# Patient Record
Sex: Female | Born: 1945 | Race: White | Hispanic: No | Marital: Married | Smoking: Never smoker
Health system: Southern US, Community
[De-identification: ages and names within clinical notes are randomized; demographics above are authoritative.]

## PROBLEM LIST (undated history)

## (undated) DIAGNOSIS — R011 Cardiac murmur, unspecified: Secondary | ICD-10-CM

## (undated) DIAGNOSIS — J45909 Unspecified asthma, uncomplicated: Secondary | ICD-10-CM

## (undated) DIAGNOSIS — M1990 Unspecified osteoarthritis, unspecified site: Secondary | ICD-10-CM

## (undated) DIAGNOSIS — E785 Hyperlipidemia, unspecified: Secondary | ICD-10-CM

## (undated) DIAGNOSIS — R0600 Dyspnea, unspecified: Secondary | ICD-10-CM

## (undated) DIAGNOSIS — Z9889 Other specified postprocedural states: Secondary | ICD-10-CM

## (undated) DIAGNOSIS — Z87442 Personal history of urinary calculi: Secondary | ICD-10-CM

## (undated) DIAGNOSIS — I2510 Atherosclerotic heart disease of native coronary artery without angina pectoris: Secondary | ICD-10-CM

## (undated) DIAGNOSIS — K649 Unspecified hemorrhoids: Secondary | ICD-10-CM

## (undated) DIAGNOSIS — T8859XA Other complications of anesthesia, initial encounter: Secondary | ICD-10-CM

## (undated) DIAGNOSIS — R112 Nausea with vomiting, unspecified: Secondary | ICD-10-CM

## (undated) DIAGNOSIS — I10 Essential (primary) hypertension: Secondary | ICD-10-CM

## (undated) DIAGNOSIS — G4730 Sleep apnea, unspecified: Secondary | ICD-10-CM

## (undated) DIAGNOSIS — I350 Nonrheumatic aortic (valve) stenosis: Secondary | ICD-10-CM

## (undated) HISTORY — DX: Cardiac murmur, unspecified: R01.1

## (undated) HISTORY — DX: Hyperlipidemia, unspecified: E78.5

## (undated) HISTORY — DX: Essential (primary) hypertension: I10

## (undated) HISTORY — PX: CARPAL TUNNEL RELEASE: SHX101

## (undated) HISTORY — DX: Unspecified hemorrhoids: K64.9

## (undated) HISTORY — PX: ABDOMINAL HYSTERECTOMY: SHX81

## (undated) HISTORY — PX: OTHER SURGICAL HISTORY: SHX169

## (undated) HISTORY — DX: Nonrheumatic aortic (valve) stenosis: I35.0

## (undated) HISTORY — PX: INGUINAL HERNIA REPAIR: SUR1180

## (undated) HISTORY — PX: CYSTECTOMY: SUR359

---

## 1998-05-08 ENCOUNTER — Encounter: Payer: Self-pay | Admitting: Family Medicine

## 1998-05-08 LAB — CONVERTED CEMR LAB: Hgb A1c MFr Bld: 7.3 %

## 1998-12-09 ENCOUNTER — Encounter: Payer: Self-pay | Admitting: Family Medicine

## 1998-12-09 LAB — CONVERTED CEMR LAB
Hgb A1c MFr Bld: 7.8 %
Microalbumin U total vol: 19 mg/L

## 1999-06-09 ENCOUNTER — Encounter: Payer: Self-pay | Admitting: Family Medicine

## 1999-06-09 LAB — CONVERTED CEMR LAB
Hgb A1c MFr Bld: 7.9 %
Microalbumin U total vol: 17.8 mg/L

## 1999-06-23 ENCOUNTER — Other Ambulatory Visit: Admission: RE | Admit: 1999-06-23 | Discharge: 1999-06-23 | Payer: Self-pay | Admitting: Family Medicine

## 2000-05-08 ENCOUNTER — Encounter: Payer: Self-pay | Admitting: Family Medicine

## 2000-05-08 LAB — CONVERTED CEMR LAB
Hgb A1c MFr Bld: 6.8 %
Microalbumin U total vol: 44.9 mg/L

## 2000-05-24 ENCOUNTER — Other Ambulatory Visit: Admission: RE | Admit: 2000-05-24 | Discharge: 2000-05-24 | Payer: Self-pay | Admitting: Family Medicine

## 2000-09-08 HISTORY — PX: OTHER SURGICAL HISTORY: SHX169

## 2000-09-19 ENCOUNTER — Encounter: Admission: RE | Admit: 2000-09-19 | Discharge: 2000-09-19 | Payer: Self-pay | Admitting: Family Medicine

## 2000-09-19 ENCOUNTER — Encounter: Payer: Self-pay | Admitting: Family Medicine

## 2000-09-26 ENCOUNTER — Ambulatory Visit (HOSPITAL_COMMUNITY): Admission: RE | Admit: 2000-09-26 | Discharge: 2000-09-26 | Payer: Self-pay | Admitting: Urology

## 2001-01-19 ENCOUNTER — Encounter: Admission: RE | Admit: 2001-01-19 | Discharge: 2001-01-19 | Payer: Self-pay | Admitting: Urology

## 2001-01-19 ENCOUNTER — Encounter: Payer: Self-pay | Admitting: Urology

## 2001-08-08 ENCOUNTER — Encounter: Payer: Self-pay | Admitting: Family Medicine

## 2001-08-08 LAB — CONVERTED CEMR LAB
Hgb A1c MFr Bld: 6.4 %
Microalbumin U total vol: 6.7 mg/L

## 2001-11-09 ENCOUNTER — Other Ambulatory Visit: Admission: RE | Admit: 2001-11-09 | Discharge: 2001-11-09 | Payer: Self-pay | Admitting: Family Medicine

## 2001-11-20 ENCOUNTER — Encounter: Payer: Self-pay | Admitting: Urology

## 2001-11-20 ENCOUNTER — Encounter: Admission: RE | Admit: 2001-11-20 | Discharge: 2001-11-20 | Payer: Self-pay | Admitting: Urology

## 2001-11-23 ENCOUNTER — Encounter: Admission: RE | Admit: 2001-11-23 | Discharge: 2001-11-23 | Payer: Self-pay | Admitting: Urology

## 2001-11-23 ENCOUNTER — Encounter: Payer: Self-pay | Admitting: Urology

## 2002-01-06 ENCOUNTER — Encounter: Payer: Self-pay | Admitting: Family Medicine

## 2002-01-06 LAB — CONVERTED CEMR LAB
Hgb A1c MFr Bld: 8.5 %
Microalbumin U total vol: 3.8 mg/L

## 2002-04-08 ENCOUNTER — Encounter: Payer: Self-pay | Admitting: Family Medicine

## 2002-04-08 LAB — CONVERTED CEMR LAB: Hgb A1c MFr Bld: 9.8 %

## 2002-05-29 ENCOUNTER — Encounter: Admission: RE | Admit: 2002-05-29 | Discharge: 2002-05-29 | Payer: Self-pay | Admitting: Urology

## 2002-05-29 ENCOUNTER — Encounter: Payer: Self-pay | Admitting: Urology

## 2002-06-04 ENCOUNTER — Ambulatory Visit (HOSPITAL_COMMUNITY): Admission: RE | Admit: 2002-06-04 | Discharge: 2002-06-04 | Payer: Self-pay | Admitting: Urology

## 2002-06-04 ENCOUNTER — Encounter: Payer: Self-pay | Admitting: Urology

## 2002-10-08 ENCOUNTER — Encounter: Payer: Self-pay | Admitting: Family Medicine

## 2002-10-08 LAB — CONVERTED CEMR LAB: Hgb A1c MFr Bld: 6.3 %

## 2002-12-09 ENCOUNTER — Encounter: Payer: Self-pay | Admitting: Family Medicine

## 2002-12-09 LAB — CONVERTED CEMR LAB
Hgb A1c MFr Bld: 6.1 %
Microalbumin U total vol: 8.3 mg/L

## 2002-12-12 ENCOUNTER — Ambulatory Visit (HOSPITAL_COMMUNITY): Admission: RE | Admit: 2002-12-12 | Discharge: 2002-12-12 | Payer: Self-pay | Admitting: Urology

## 2002-12-12 ENCOUNTER — Encounter: Payer: Self-pay | Admitting: Urology

## 2002-12-28 ENCOUNTER — Other Ambulatory Visit: Admission: RE | Admit: 2002-12-28 | Discharge: 2002-12-28 | Payer: Self-pay | Admitting: Family Medicine

## 2003-06-09 ENCOUNTER — Encounter: Payer: Self-pay | Admitting: Family Medicine

## 2003-06-09 LAB — CONVERTED CEMR LAB: Hgb A1c MFr Bld: 6.7 %

## 2003-12-10 ENCOUNTER — Encounter: Payer: Self-pay | Admitting: Family Medicine

## 2003-12-10 LAB — CONVERTED CEMR LAB
Hgb A1c MFr Bld: 7.5 %
Microalbumin U total vol: 21.8 mg/L

## 2003-12-30 ENCOUNTER — Other Ambulatory Visit: Admission: RE | Admit: 2003-12-30 | Discharge: 2003-12-30 | Payer: Self-pay | Admitting: Family Medicine

## 2004-03-08 ENCOUNTER — Encounter: Payer: Self-pay | Admitting: Family Medicine

## 2004-03-08 LAB — CONVERTED CEMR LAB: Hgb A1c MFr Bld: 6.4 %

## 2004-09-08 ENCOUNTER — Encounter: Payer: Self-pay | Admitting: Family Medicine

## 2004-09-08 LAB — CONVERTED CEMR LAB: Hgb A1c MFr Bld: 6.9 %

## 2004-09-14 ENCOUNTER — Ambulatory Visit: Payer: Self-pay | Admitting: Family Medicine

## 2004-09-16 ENCOUNTER — Ambulatory Visit: Payer: Self-pay | Admitting: Family Medicine

## 2004-12-21 ENCOUNTER — Ambulatory Visit: Payer: Self-pay | Admitting: Family Medicine

## 2005-02-06 ENCOUNTER — Encounter: Payer: Self-pay | Admitting: Family Medicine

## 2005-02-06 LAB — CONVERTED CEMR LAB
Hgb A1c MFr Bld: 7.1 %
Microalbumin U total vol: 18.5 mg/L

## 2005-02-12 ENCOUNTER — Ambulatory Visit: Payer: Self-pay | Admitting: Family Medicine

## 2005-02-15 ENCOUNTER — Other Ambulatory Visit: Admission: RE | Admit: 2005-02-15 | Discharge: 2005-02-15 | Payer: Self-pay | Admitting: Family Medicine

## 2005-02-15 ENCOUNTER — Ambulatory Visit: Payer: Self-pay | Admitting: Family Medicine

## 2005-03-03 ENCOUNTER — Ambulatory Visit: Payer: Self-pay | Admitting: Family Medicine

## 2005-04-12 ENCOUNTER — Ambulatory Visit: Payer: Self-pay | Admitting: Family Medicine

## 2005-04-13 ENCOUNTER — Encounter: Payer: Self-pay | Admitting: Family Medicine

## 2005-04-13 LAB — CONVERTED CEMR LAB: Hgb A1c MFr Bld: 6.9 %

## 2005-04-16 ENCOUNTER — Ambulatory Visit: Payer: Self-pay | Admitting: Family Medicine

## 2005-06-11 ENCOUNTER — Ambulatory Visit: Payer: Self-pay | Admitting: Family Medicine

## 2005-09-08 ENCOUNTER — Encounter: Payer: Self-pay | Admitting: Family Medicine

## 2005-09-08 LAB — CONVERTED CEMR LAB: Hgb A1c MFr Bld: 5.8 %

## 2005-10-05 ENCOUNTER — Ambulatory Visit: Payer: Self-pay | Admitting: Family Medicine

## 2006-03-08 ENCOUNTER — Encounter: Payer: Self-pay | Admitting: Family Medicine

## 2006-03-08 LAB — CONVERTED CEMR LAB: Hgb A1c MFr Bld: 6.7 %

## 2006-03-31 ENCOUNTER — Ambulatory Visit: Payer: Self-pay | Admitting: Family Medicine

## 2006-03-31 LAB — CONVERTED CEMR LAB: Microalbumin U total vol: 29.5 mg/L

## 2006-04-06 ENCOUNTER — Ambulatory Visit: Payer: Self-pay | Admitting: Family Medicine

## 2006-04-06 ENCOUNTER — Other Ambulatory Visit: Admission: RE | Admit: 2006-04-06 | Discharge: 2006-04-06 | Payer: Self-pay | Admitting: Family Medicine

## 2006-04-06 ENCOUNTER — Encounter: Payer: Self-pay | Admitting: Family Medicine

## 2006-04-06 LAB — CONVERTED CEMR LAB: Pap Smear: NORMAL

## 2006-05-03 ENCOUNTER — Ambulatory Visit: Payer: Self-pay | Admitting: Family Medicine

## 2006-10-03 ENCOUNTER — Ambulatory Visit: Payer: Self-pay | Admitting: Family Medicine

## 2006-10-05 ENCOUNTER — Ambulatory Visit: Payer: Self-pay | Admitting: Family Medicine

## 2007-01-03 ENCOUNTER — Ambulatory Visit: Payer: Self-pay | Admitting: Family Medicine

## 2007-01-03 LAB — CONVERTED CEMR LAB
Hgb A1c MFr Bld: 9.7 %
Hgb A1c MFr Bld: 9.7 % — ABNORMAL HIGH (ref 4.6–6.0)

## 2007-01-12 ENCOUNTER — Ambulatory Visit: Payer: Self-pay | Admitting: Family Medicine

## 2007-01-26 ENCOUNTER — Ambulatory Visit: Payer: Self-pay | Admitting: Family Medicine

## 2007-02-27 ENCOUNTER — Ambulatory Visit: Payer: Self-pay | Admitting: Family Medicine

## 2007-05-24 ENCOUNTER — Encounter: Payer: Self-pay | Admitting: Family Medicine

## 2007-05-24 DIAGNOSIS — E119 Type 2 diabetes mellitus without complications: Secondary | ICD-10-CM

## 2007-05-24 DIAGNOSIS — N200 Calculus of kidney: Secondary | ICD-10-CM

## 2007-05-24 HISTORY — DX: Type 2 diabetes mellitus without complications: E11.9

## 2007-05-24 HISTORY — DX: Calculus of kidney: N20.0

## 2007-05-29 ENCOUNTER — Ambulatory Visit: Payer: Self-pay | Admitting: Family Medicine

## 2007-07-20 ENCOUNTER — Telehealth (INDEPENDENT_AMBULATORY_CARE_PROVIDER_SITE_OTHER): Payer: Self-pay | Admitting: *Deleted

## 2007-09-18 ENCOUNTER — Ambulatory Visit: Payer: Self-pay | Admitting: Family Medicine

## 2007-09-18 LAB — CONVERTED CEMR LAB
ALT: 20 units/L (ref 0–35)
AST: 17 units/L (ref 0–37)
Albumin: 3.6 g/dL (ref 3.5–5.2)
Alkaline Phosphatase: 107 units/L (ref 39–117)
BUN: 15 mg/dL (ref 6–23)
Bilirubin, Direct: 0.2 mg/dL (ref 0.0–0.3)
CO2: 31 meq/L (ref 19–32)
Calcium: 9.9 mg/dL (ref 8.4–10.5)
Chloride: 106 meq/L (ref 96–112)
Cholesterol: 143 mg/dL (ref 0–200)
Creatinine, Ser: 0.4 mg/dL (ref 0.4–1.2)
Creatinine,U: 64.3 mg/dL
GFR calc Af Amer: 208 mL/min
GFR calc non Af Amer: 172 mL/min
Glucose, Bld: 121 mg/dL — ABNORMAL HIGH (ref 70–99)
HDL: 34.7 mg/dL — ABNORMAL LOW (ref >39.0)
Hgb A1c MFr Bld: 7.3 % — ABNORMAL HIGH (ref 4.6–6.0)
LDL Cholesterol: 93 mg/dL (ref 0–99)
Microalb Creat Ratio: 23.3 mg/g (ref 0.0–30.0)
Microalb, Ur: 1.5 mg/dL (ref 0.0–1.9)
Potassium: 4 meq/L (ref 3.5–5.1)
Sodium: 142 meq/L (ref 135–145)
TSH: 0.58 microintl units/mL (ref 0.35–5.50)
Total Bilirubin: 1.8 mg/dL — ABNORMAL HIGH (ref 0.3–1.2)
Total CHOL/HDL Ratio: 4.1
Total Protein: 6.3 g/dL (ref 6.0–8.3)
Triglycerides: 78 mg/dL (ref 0–149)
VLDL: 16 mg/dL (ref 0–40)

## 2007-09-20 ENCOUNTER — Encounter: Payer: Self-pay | Admitting: Family Medicine

## 2007-09-20 ENCOUNTER — Ambulatory Visit: Payer: Self-pay | Admitting: Family Medicine

## 2007-09-20 ENCOUNTER — Other Ambulatory Visit: Admission: RE | Admit: 2007-09-20 | Discharge: 2007-09-20 | Payer: Self-pay | Admitting: Family Medicine

## 2007-09-20 LAB — CONVERTED CEMR LAB: Pap Smear: NORMAL

## 2007-09-26 ENCOUNTER — Encounter (INDEPENDENT_AMBULATORY_CARE_PROVIDER_SITE_OTHER): Payer: Self-pay | Admitting: *Deleted

## 2007-10-06 ENCOUNTER — Ambulatory Visit: Payer: Self-pay | Admitting: Family Medicine

## 2007-10-09 ENCOUNTER — Encounter (INDEPENDENT_AMBULATORY_CARE_PROVIDER_SITE_OTHER): Payer: Self-pay | Admitting: *Deleted

## 2007-10-20 LAB — HM DIABETES EYE EXAM: HM Diabetic Eye Exam: NORMAL

## 2007-12-04 ENCOUNTER — Telehealth (INDEPENDENT_AMBULATORY_CARE_PROVIDER_SITE_OTHER): Payer: Self-pay | Admitting: *Deleted

## 2007-12-06 ENCOUNTER — Encounter: Payer: Self-pay | Admitting: Family Medicine

## 2007-12-18 ENCOUNTER — Ambulatory Visit: Payer: Self-pay | Admitting: Family Medicine

## 2007-12-18 LAB — CONVERTED CEMR LAB: Hgb A1c MFr Bld: 7.7 % — ABNORMAL HIGH (ref 4.6–6.0)

## 2007-12-20 ENCOUNTER — Ambulatory Visit: Payer: Self-pay | Admitting: Family Medicine

## 2007-12-20 DIAGNOSIS — M899 Disorder of bone, unspecified: Secondary | ICD-10-CM | POA: Insufficient documentation

## 2007-12-20 DIAGNOSIS — M949 Disorder of cartilage, unspecified: Secondary | ICD-10-CM

## 2007-12-20 HISTORY — DX: Disorder of bone, unspecified: M89.9

## 2008-03-18 ENCOUNTER — Ambulatory Visit: Payer: Self-pay | Admitting: Family Medicine

## 2008-03-18 LAB — CONVERTED CEMR LAB: Hgb A1c MFr Bld: 8.2 % — ABNORMAL HIGH (ref 4.6–6.0)

## 2008-03-21 ENCOUNTER — Ambulatory Visit: Payer: Self-pay | Admitting: Family Medicine

## 2008-03-29 ENCOUNTER — Telehealth: Payer: Self-pay | Admitting: Family Medicine

## 2008-07-08 ENCOUNTER — Telehealth: Payer: Self-pay | Admitting: Family Medicine

## 2008-07-09 ENCOUNTER — Ambulatory Visit: Payer: Self-pay | Admitting: Family Medicine

## 2008-07-09 LAB — CONVERTED CEMR LAB: Hgb A1c MFr Bld: 7.5 % — ABNORMAL HIGH (ref 4.6–6.0)

## 2008-07-10 LAB — CONVERTED CEMR LAB: Vit D, 1,25-Dihydroxy: 37 (ref 30–89)

## 2008-07-11 ENCOUNTER — Ambulatory Visit: Payer: Self-pay | Admitting: Family Medicine

## 2008-11-27 ENCOUNTER — Ambulatory Visit: Payer: Self-pay | Admitting: Family Medicine

## 2008-11-27 LAB — CONVERTED CEMR LAB
ALT: 24 units/L (ref 0–35)
AST: 20 units/L (ref 0–37)
Albumin: 3.7 g/dL (ref 3.5–5.2)
Alkaline Phosphatase: 103 units/L (ref 39–117)
BUN: 17 mg/dL (ref 6–23)
Basophils Absolute: 0 10*3/uL (ref 0.0–0.1)
Basophils Relative: 1.3 % (ref 0.0–3.0)
Bilirubin, Direct: 0.2 mg/dL (ref 0.0–0.3)
CO2: 30 meq/L (ref 19–32)
Calcium: 10 mg/dL (ref 8.4–10.5)
Chloride: 109 meq/L (ref 96–112)
Cholesterol: 139 mg/dL (ref 0–200)
Creatinine, Ser: 0.5 mg/dL (ref 0.4–1.2)
Creatinine,U: 80.2 mg/dL
Eosinophils Absolute: 0.1 10*3/uL (ref 0.0–0.7)
Eosinophils Relative: 2.6 % (ref 0.0–5.0)
GFR calc Af Amer: 160 mL/min
GFR calc non Af Amer: 132 mL/min
Glucose, Bld: 116 mg/dL — ABNORMAL HIGH (ref 70–99)
HCT: 40.5 % (ref 36.0–46.0)
HDL: 35.5 mg/dL — ABNORMAL LOW (ref >39.0)
Hemoglobin: 14.1 g/dL (ref 12.0–15.0)
Hgb A1c MFr Bld: 7.1 % — ABNORMAL HIGH (ref 4.6–6.0)
LDL Cholesterol: 88 mg/dL (ref 0–99)
Lymphocytes Relative: 38.1 % (ref 12.0–46.0)
MCHC: 34.7 g/dL (ref 30.0–36.0)
MCV: 90.4 fL (ref 78.0–100.0)
Microalb Creat Ratio: 24.9 mg/g (ref 0.0–30.0)
Microalb, Ur: 2 mg/dL — ABNORMAL HIGH (ref 0.0–1.9)
Monocytes Absolute: 0.3 10*3/uL (ref 0.1–1.0)
Monocytes Relative: 8.7 % (ref 3.0–12.0)
Neutro Abs: 1.8 10*3/uL (ref 1.4–7.7)
Neutrophils Relative %: 49.3 % (ref 43.0–77.0)
Platelets: 200 10*3/uL (ref 150–400)
Potassium: 4.1 meq/L (ref 3.5–5.1)
RBC: 4.48 M/uL (ref 3.87–5.11)
RDW: 13.4 % (ref 11.5–14.6)
Sodium: 142 meq/L (ref 135–145)
TSH: 0.62 microintl units/mL (ref 0.35–5.50)
Total Bilirubin: 1.8 mg/dL — ABNORMAL HIGH (ref 0.3–1.2)
Total CHOL/HDL Ratio: 3.9
Total Protein: 6.5 g/dL (ref 6.0–8.3)
Triglycerides: 79 mg/dL (ref 0–149)
VLDL: 16 mg/dL (ref 0–40)
WBC: 3.5 10*3/uL — ABNORMAL LOW (ref 4.5–10.5)

## 2008-11-28 ENCOUNTER — Encounter: Payer: Self-pay | Admitting: Family Medicine

## 2008-11-28 LAB — CONVERTED CEMR LAB: Vit D, 1,25-Dihydroxy: 71 (ref 30–89)

## 2008-12-05 ENCOUNTER — Encounter: Payer: Self-pay | Admitting: Family Medicine

## 2008-12-05 ENCOUNTER — Other Ambulatory Visit: Admission: RE | Admit: 2008-12-05 | Discharge: 2008-12-05 | Payer: Self-pay | Admitting: Family Medicine

## 2008-12-05 ENCOUNTER — Ambulatory Visit: Payer: Self-pay | Admitting: Family Medicine

## 2008-12-05 LAB — HM PAP SMEAR

## 2008-12-05 LAB — CONVERTED CEMR LAB: Pap Smear: NORMAL

## 2008-12-11 ENCOUNTER — Encounter: Payer: Self-pay | Admitting: Family Medicine

## 2008-12-20 ENCOUNTER — Encounter (INDEPENDENT_AMBULATORY_CARE_PROVIDER_SITE_OTHER): Payer: Self-pay | Admitting: *Deleted

## 2008-12-26 ENCOUNTER — Encounter (INDEPENDENT_AMBULATORY_CARE_PROVIDER_SITE_OTHER): Payer: Self-pay | Admitting: *Deleted

## 2008-12-26 ENCOUNTER — Ambulatory Visit: Payer: Self-pay | Admitting: Family Medicine

## 2008-12-26 LAB — CONVERTED CEMR LAB
OCCULT 1: NEGATIVE
OCCULT 2: NEGATIVE
OCCULT 3: NEGATIVE

## 2008-12-30 ENCOUNTER — Ambulatory Visit: Payer: Self-pay | Admitting: Family Medicine

## 2008-12-30 LAB — CONVERTED CEMR LAB
Bilirubin Urine: NEGATIVE
Glucose, Urine, Semiquant: NEGATIVE
Ketones, urine, test strip: NEGATIVE
Nitrite: NEGATIVE
Protein, U semiquant: NEGATIVE
Specific Gravity, Urine: 1.015
Urobilinogen, UA: 0.2
pH: 6

## 2009-03-20 ENCOUNTER — Ambulatory Visit: Payer: Self-pay | Admitting: Family Medicine

## 2009-06-04 ENCOUNTER — Ambulatory Visit: Payer: Self-pay | Admitting: Family Medicine

## 2009-06-04 LAB — CONVERTED CEMR LAB: Hgb A1c MFr Bld: 6.9 % — ABNORMAL HIGH (ref 4.6–6.5)

## 2009-06-11 ENCOUNTER — Ambulatory Visit: Payer: Self-pay | Admitting: Family Medicine

## 2009-07-02 ENCOUNTER — Encounter: Payer: Self-pay | Admitting: Family Medicine

## 2009-07-07 ENCOUNTER — Ambulatory Visit: Payer: Self-pay | Admitting: Family Medicine

## 2009-08-22 ENCOUNTER — Encounter: Payer: Self-pay | Admitting: Family Medicine

## 2009-08-29 ENCOUNTER — Encounter: Payer: Self-pay | Admitting: Family Medicine

## 2009-08-29 HISTORY — PX: OTHER SURGICAL HISTORY: SHX169

## 2009-08-31 ENCOUNTER — Encounter: Payer: Self-pay | Admitting: Family Medicine

## 2009-11-08 DIAGNOSIS — R011 Cardiac murmur, unspecified: Secondary | ICD-10-CM

## 2009-11-08 HISTORY — DX: Cardiac murmur, unspecified: R01.1

## 2009-12-03 ENCOUNTER — Ambulatory Visit: Payer: Self-pay | Admitting: Family Medicine

## 2009-12-04 LAB — CONVERTED CEMR LAB
ALT: 21 units/L (ref 0–35)
AST: 17 units/L (ref 0–37)
Albumin: 3.5 g/dL (ref 3.5–5.2)
Alkaline Phosphatase: 94 units/L (ref 39–117)
BUN: 14 mg/dL (ref 6–23)
Bilirubin, Direct: 0.1 mg/dL (ref 0.0–0.3)
CO2: 29 meq/L (ref 19–32)
Calcium: 9.7 mg/dL (ref 8.4–10.5)
Chloride: 106 meq/L (ref 96–112)
Cholesterol: 126 mg/dL (ref 0–200)
Creatinine, Ser: 0.4 mg/dL (ref 0.4–1.2)
Creatinine,U: 78.1 mg/dL
GFR calc non Af Amer: 170.68 mL/min (ref >60)
Glucose, Bld: 131 mg/dL — ABNORMAL HIGH (ref 70–99)
HDL: 36.8 mg/dL — ABNORMAL LOW (ref >39.00)
Hgb A1c MFr Bld: 7.8 % — ABNORMAL HIGH (ref 4.6–6.5)
LDL Cholesterol: 77 mg/dL (ref 0–99)
Microalb Creat Ratio: 48.7 mg/g — ABNORMAL HIGH (ref 0.0–30.0)
Microalb, Ur: 3.8 mg/dL — ABNORMAL HIGH (ref 0.0–1.9)
Phosphorus: 3.7 mg/dL (ref 2.3–4.6)
Potassium: 4 meq/L (ref 3.5–5.1)
Sodium: 142 meq/L (ref 135–145)
TSH: 0.73 microintl units/mL (ref 0.35–5.50)
Total Bilirubin: 1.2 mg/dL (ref 0.3–1.2)
Total CHOL/HDL Ratio: 3
Total Protein: 6.5 g/dL (ref 6.0–8.3)
Triglycerides: 63 mg/dL (ref 0.0–149.0)
VLDL: 12.6 mg/dL (ref 0.0–40.0)
Vit D, 25-Hydroxy: 31 ng/mL (ref 30–89)

## 2009-12-15 ENCOUNTER — Encounter: Payer: Self-pay | Admitting: Family Medicine

## 2009-12-16 ENCOUNTER — Ambulatory Visit: Payer: Self-pay | Admitting: Family Medicine

## 2009-12-16 DIAGNOSIS — T81329A Deep disruption or dehiscence of operation wound, unspecified, initial encounter: Secondary | ICD-10-CM | POA: Insufficient documentation

## 2009-12-16 DIAGNOSIS — T8132XA Disruption of internal operation (surgical) wound, not elsewhere classified, initial encounter: Secondary | ICD-10-CM | POA: Insufficient documentation

## 2009-12-16 DIAGNOSIS — E8941 Symptomatic postprocedural ovarian failure: Secondary | ICD-10-CM | POA: Insufficient documentation

## 2009-12-16 DIAGNOSIS — T8131XA Disruption of external operation (surgical) wound, not elsewhere classified, initial encounter: Secondary | ICD-10-CM | POA: Insufficient documentation

## 2009-12-16 HISTORY — DX: Symptomatic postprocedural ovarian failure: E89.41

## 2009-12-16 LAB — CONVERTED CEMR LAB
Bilirubin Urine: NEGATIVE
Glucose, Urine, Semiquant: 100
Ketones, urine, test strip: NEGATIVE
Nitrite: NEGATIVE
Protein, U semiquant: NEGATIVE
Specific Gravity, Urine: 1.02
Urobilinogen, UA: 0.2
WBC Urine, dipstick: NEGATIVE
pH: 6

## 2009-12-23 ENCOUNTER — Encounter: Payer: Self-pay | Admitting: Family Medicine

## 2009-12-24 ENCOUNTER — Encounter: Payer: Self-pay | Admitting: Family Medicine

## 2009-12-31 ENCOUNTER — Ambulatory Visit: Payer: Self-pay | Admitting: Family Medicine

## 2009-12-31 ENCOUNTER — Encounter (INDEPENDENT_AMBULATORY_CARE_PROVIDER_SITE_OTHER): Payer: Self-pay | Admitting: *Deleted

## 2009-12-31 LAB — CONVERTED CEMR LAB
OCCULT 1: NEGATIVE
OCCULT 2: NEGATIVE
OCCULT 3: NEGATIVE

## 2009-12-31 LAB — FECAL OCCULT BLOOD, GUAIAC: Fecal Occult Blood: NEGATIVE

## 2010-01-28 ENCOUNTER — Ambulatory Visit: Payer: Self-pay | Admitting: Family Medicine

## 2010-01-28 LAB — CONVERTED CEMR LAB
Glucose, Bld: 151 mg/dL — ABNORMAL HIGH (ref 70–99)
Hgb A1c MFr Bld: 7.9 % — ABNORMAL HIGH (ref 4.6–6.5)

## 2010-02-09 ENCOUNTER — Ambulatory Visit: Payer: Self-pay | Admitting: Family Medicine

## 2010-04-16 ENCOUNTER — Ambulatory Visit: Payer: Self-pay | Admitting: Family Medicine

## 2010-04-16 LAB — CONVERTED CEMR LAB
Glucose, Bld: 130 mg/dL — ABNORMAL HIGH (ref 70–99)
Hgb A1c MFr Bld: 8 % — ABNORMAL HIGH (ref 4.6–6.5)

## 2010-04-20 ENCOUNTER — Ambulatory Visit: Payer: Self-pay | Admitting: Family Medicine

## 2010-04-20 ENCOUNTER — Telehealth: Payer: Self-pay | Admitting: Family Medicine

## 2010-04-20 LAB — CONVERTED CEMR LAB
Bilirubin Urine: NEGATIVE
Glucose, Urine, Semiquant: NEGATIVE
Ketones, urine, test strip: NEGATIVE
Nitrite: NEGATIVE
Specific Gravity, Urine: >=1.03
Urobilinogen, UA: 0.2
pH: 6

## 2010-04-29 ENCOUNTER — Ambulatory Visit: Payer: Self-pay | Admitting: Family Medicine

## 2010-05-06 ENCOUNTER — Encounter (INDEPENDENT_AMBULATORY_CARE_PROVIDER_SITE_OTHER): Payer: Self-pay | Admitting: *Deleted

## 2010-05-06 ENCOUNTER — Telehealth: Payer: Self-pay | Admitting: Family Medicine

## 2010-05-25 ENCOUNTER — Telehealth: Payer: Self-pay | Admitting: Family Medicine

## 2010-05-26 ENCOUNTER — Ambulatory Visit: Payer: Self-pay | Admitting: Family Medicine

## 2010-05-26 ENCOUNTER — Encounter: Payer: Self-pay | Admitting: Family Medicine

## 2010-05-26 DIAGNOSIS — R011 Cardiac murmur, unspecified: Secondary | ICD-10-CM

## 2010-05-26 DIAGNOSIS — R319 Hematuria, unspecified: Secondary | ICD-10-CM | POA: Insufficient documentation

## 2010-05-26 HISTORY — DX: Cardiac murmur, unspecified: R01.1

## 2010-05-26 LAB — CONVERTED CEMR LAB
Bilirubin Urine: NEGATIVE
Glucose, Urine, Semiquant: NEGATIVE
Ketones, urine, test strip: NEGATIVE
Nitrite: NEGATIVE
Specific Gravity, Urine: >=1.03
Urobilinogen, UA: 0.2
WBC Urine, dipstick: NEGATIVE
pH: 6

## 2010-06-08 ENCOUNTER — Encounter: Payer: Self-pay | Admitting: Family Medicine

## 2010-06-11 ENCOUNTER — Ambulatory Visit: Payer: Self-pay

## 2010-06-11 ENCOUNTER — Encounter: Payer: Self-pay | Admitting: Family Medicine

## 2010-06-15 ENCOUNTER — Encounter (INDEPENDENT_AMBULATORY_CARE_PROVIDER_SITE_OTHER): Payer: Self-pay | Admitting: *Deleted

## 2010-06-16 ENCOUNTER — Encounter: Payer: Self-pay | Admitting: Family Medicine

## 2010-06-17 ENCOUNTER — Encounter (INDEPENDENT_AMBULATORY_CARE_PROVIDER_SITE_OTHER): Payer: Self-pay | Admitting: *Deleted

## 2010-08-27 ENCOUNTER — Ambulatory Visit: Payer: Self-pay | Admitting: Family Medicine

## 2010-08-27 ENCOUNTER — Encounter: Payer: Self-pay | Admitting: Gastroenterology

## 2010-08-27 DIAGNOSIS — R109 Unspecified abdominal pain: Secondary | ICD-10-CM | POA: Insufficient documentation

## 2010-08-27 DIAGNOSIS — Z87448 Personal history of other diseases of urinary system: Secondary | ICD-10-CM | POA: Insufficient documentation

## 2010-08-27 HISTORY — DX: Unspecified abdominal pain: R10.9

## 2010-08-27 LAB — CONVERTED CEMR LAB
Bilirubin Urine: NEGATIVE
Blood in Urine, dipstick: NEGATIVE
Glucose, Urine, Semiquant: NEGATIVE
Ketones, urine, test strip: NEGATIVE
Nitrite: NEGATIVE
Protein, U semiquant: NEGATIVE
Specific Gravity, Urine: 1.02
Urobilinogen, UA: 0.2
WBC Urine, dipstick: NEGATIVE
pH: 6

## 2010-08-28 LAB — CONVERTED CEMR LAB: Hgb A1c MFr Bld: 7.4 % — ABNORMAL HIGH (ref 4.6–6.5)

## 2010-09-25 ENCOUNTER — Encounter (INDEPENDENT_AMBULATORY_CARE_PROVIDER_SITE_OTHER): Payer: Self-pay | Admitting: *Deleted

## 2010-09-28 ENCOUNTER — Ambulatory Visit: Payer: Self-pay | Admitting: Gastroenterology

## 2010-10-12 ENCOUNTER — Ambulatory Visit: Payer: Self-pay | Admitting: Gastroenterology

## 2010-10-12 LAB — HM COLONOSCOPY

## 2010-10-14 ENCOUNTER — Encounter: Payer: Self-pay | Admitting: Gastroenterology

## 2010-11-08 LAB — HM DEXA SCAN

## 2010-12-05 ENCOUNTER — Encounter: Payer: Self-pay | Admitting: Family Medicine

## 2010-12-06 LAB — CONVERTED CEMR LAB
Calcium, Total (PTH): 10.3 mg/dL (ref 8.4–10.5)
PTH: 32.5 pg/mL (ref 14.0–72.0)
Vit D, 1,25-Dihydroxy: 27 — ABNORMAL LOW (ref 30–89)

## 2010-12-10 NOTE — Assessment & Plan Note (Signed)
Summary: ? UTI  /LSF   Vital Signs:  Patient profile:   65 year old female Height:      65 inches Weight:      161.50 pounds BMI:     26.97 Temp:     98.8 degrees F oral Pulse rate:   84 / minute Pulse rhythm:   regular BP sitting:   142 / 82  (left arm) Cuff size:   regular  Vitals Entered By: Delilah Shan CMA Mallary Kreger Dull) (May 26, 2010 3:39 PM) CC: ? UTI   History of Present Illness: dysuria: as below duration of symptoms: over the weekend, last 3-4 days.  abdominal pain: occ "twinge in lower abdomen" to the left of midline fevers:no back pain:no vomiting:no other concerns: "I thought the twinges were due to working in the yard."    Saw some blood in urine Saturday and Sunday.  Had prev been on cipro starting on 04/20/10 for 10 days.  Was doing well after that.   No fevers.  H/o kidney stones but "this is nothing like that".  No change in BMs.  On aspirin.  No other bleeding, bruising.   Symptoms restarted as above.   Hernia repair, hysterectomy, and bladder tack 10/10. Nonsmoker.   No CP/SOB/BLE edema.  Feeling well o/w.  "I don't feel bad, I just wanted to get this checked out."  Allergies: 1)  Penicillin G Potassium (Penicillin G Potassium) 2)  * Sulfa (Sulfonamides) Group  Past History:  Past Medical History: Last updated: 05/24/2007 Diabetes mellitus, type II Hypertension  Review of Systems       See HPI.  Otherwise noncontributory.    Physical Exam  General:  GEN: nad, alert and oriented HEENT: mucous membranes moist NECK: supple w/o LA CV: rrr.  new SEM heard throughout the precordium and radiates up to the carotids bilaterally.  PULM: ctab, no inc wob ABD: soft, +bs, no rebound, no cva pain EXT: no edema SKIN: no acute rash    Impression & Recommendations:  Problem # 1:  HEMATURIA UNSPECIFIED (ICD-599.70) Ua reviewed.  No fevers and no pain with urination. Nontoxic.  Will check cx and then contact patient.  Will treat if organism ID'd.  If no  organism seen, then patient will follow up is symptoms continue.   The following medications were removed from the medication list:    Cipro 500 Mg Tabs (Ciprofloxacin hcl) ..... One tab by mouth two times a day for ten days.  Orders: T-Culture, Urine ([id number]) Specimen Handling ([id number])  Problem # 2:  CARDIAC MURMUR (ICD-785.2) New murmur.  Will send for echo. No BLE edema and lungs clear.  Stable based on exam.  Okay for outpatient follow up.  If any SOB/CP/inc in bilateral lower extremity edema, then to ER/notify MD.  she agrees.  Plan for follow up in 1 month.  Orders: Radiology Referral (Radiology)  Complete Medication List: 1)  Nasonex 50 Mcg/act Susp (Mometasone furoate) .Marland Kitchen.. 1 spray each nostril every day or as needed 2)  Quinapril Hcl 20 Mg Tabs (Quinapril hcl) .... Take one by mouth twice daily 3)  Lipitor 10 Mg Tabs (Atorvastatin calcium) .... Take one by mouth at bedtime 4)  Hydrochlorothiazide 25 Mg Tabs (Hydrochlorothiazide) .... Take one by mouth daily 5)  Metformin Hcl 1000 Mg Tabs (Metformin hcl) .... Take one by mouth twice a day. 6)  Levemir Flexpen 100 Unit/ml Soln (Insulin detemir) .... Inject 85 units subq daily dx 250.00 7)  Freestyle Lite Test Strp (Glucose blood) .Marland KitchenMarland KitchenMarland Kitchen  Check blood sugar 4 times a day 8)  Fish Oil Concentrate 1000 Mg Caps (Omega-3 fatty acids) .Marland Kitchen.. 1 by mouth twice daily 9)  Vitamin D 1000 Unit Tabs (Cholecalciferol) .Marland Kitchen.. 1 daily by mouth 10)  Bd Pen Needle Short U/f 31g X 8 Mm Misc (Insulin pen needle) .... Use as directed 11)  Aspirin 81 Mg Tbec (Aspirin) .... Take one by mouth daily 12)  Freestyle Lancets Misc (Lancets) .... Check blood sugar 4 times a day.  Patient Instructions: 1)  We'll contact you with your lab report.  I want to see you back in 1 month to recheck your heart murmur.  See Shirlee Limerick about your referral before your leave today.  Let us know if your symptoms change.   Laboratory Results   Urine Tests  Date/Time Received:  May 26, 2010 3:40 PM   Routine Urinalysis   Color: orange Appearance: Cloudy Glucose: negative   (Normal Range: Negative) Bilirubin: negative   (Normal Range: Negative) Ketone: negative   (Normal Range: Negative) Spec. Gravity: >=1.030   (Normal Range: 1.003-1.035) Blood: large   (Normal Range: Negative) pH: 6.0   (Normal Range: 5.0-8.0) Protein: trace   (Normal Range: Negative) Urobilinogen: 0.2   (Normal Range: 0-1) Nitrite: negative   (Normal Range: Negative) Leukocyte Esterace: negative   (Normal Range: Negative)       Current Allergies (reviewed today): PENICILLIN G POTASSIUM (PENICILLIN G POTASSIUM) * SULFA (SULFONAMIDES) GROUP

## 2010-12-10 NOTE — Assessment & Plan Note (Signed)
Summary: DISCUSS MEDICATIONS/CLE   Vital Signs:  Patient profile:   65 year old female Weight:      166 pounds BMI:     27.72 Temp:     98.1 degrees F oral Pulse rate:   88 / minute Pulse rhythm:   regular BP sitting:   120 / 60  (left arm) Cuff size:   regular  Vitals Entered ByProvidence Crosby (Mar 20, 2009 10:25 AM) CC: followup [per [facility] and dr. Hetty Ely   History of Present Illness: Pt here to discuss stopping Avandia due to being on Levemir. She has had great control and feels well without any untoward sxs but am getting things regularly from the insurance co talking about complications of Avandia and Levemir.   Allergies: 1)  Penicillin G Potassium (Penicillin G Potassium) 2)  * Sulfa (Sulfonamides) Group  Physical Exam  General:  Well-developed,well-nourished,in no acute distress; alert,appropriate and cooperative throughout examination Head:  Normocephalic and atraumatic without obvious abnormalities. No apparent alopecia or balding. Eyes:  No corneal or conjunctival inflammation noted. EOMI. Perrla. Funduscopic exam benign, without hemorrhages, exudates or papilledema. Vision grossly normal. Ears:  External ear exam shows no significant lesions or deformities.  Otoscopic examination reveals clear canals, tympanic membranes are intact bilaterally without bulging, retraction, inflammation or discharge. Hearing is grossly normal bilaterally. Nose:  External nasal examination shows no deformity or inflammation. Nasal mucosa are pink and moist without lesions or exudates. Mouth:  Oral mucosa and oropharynx without lesions or exudates.  Teeth in good repair. Neck:  No deformities, masses, or tenderness noted. Lungs:  Normal respiratory effort, chest expands symmetrically. Lungs are clear to auscultation, no crackles or wheezes. Heart:  Normal rate and regular rhythm. S1 and S2 normal without gallop, murmur, click, rub or other extra sounds.   Impression & Recommendations:   Problem # 1:  DIABETES MELLITUS, TYPE II (ICD-250.00) Assessment Unchanged Has had great control but due to insurance insistence, will stop Avandia and continue with Levemir alone. Incr Levemir 5 units no more frequentkly than every 3 days as sugar gets over avg of 130 (herr usual nos now are 100-110). Script given. See me if nos become confusing. Her updated medication list for this problem includes:    Avandia 8 Mg Tabs (Rosiglitazone maleate) .Marland Kitchen... 1 tablet daily by mouth    Quinapril Hcl 20 Mg Tabs (Quinapril hcl) .Marland Kitchen... Take one by mouth twice daily    Metformin Hcl 1000 Mg Tabs (Metformin hcl) .Marland Kitchen... Take one by mouth twice a day.    Levemir Flexpen 100 Unit/ml Soln (Insulin detemir) ..... Inject 64 units subq daily dx 250.00  Labs Reviewed: Creat: 0.5 (11/27/2008)   Microalbumin: 29.5 (03/31/2006)  Last Eye Exam: normal (12/20/2006) Reviewed HgBA1c results: 7.1 (11/27/2008)  7.5 (07/09/2008)  Complete Medication List: 1)  Nasonex 50 Mcg/act Susp (Mometasone furoate) .Marland Kitchen.. 1 spray each nostril as needed 2)  Avandia 8 Mg Tabs (Rosiglitazone maleate) .Marland Kitchen.. 1 tablet daily by mouth 3)  Quinapril Hcl 20 Mg Tabs (Quinapril hcl) .... Take one by mouth twice daily 4)  Lipitor 10 Mg Tabs (Atorvastatin calcium) .... Take one by mouth at bedtime 5)  Hydrochlorothiazide 25 Mg Tabs (Hydrochlorothiazide) .... Take one by mouth daily 6)  Metformin Hcl 1000 Mg Tabs (Metformin hcl) .... Take one by mouth twice a day. 7)  Levemir Flexpen 100 Unit/ml Soln (Insulin detemir) .... Inject 64 units subq daily dx 250.00 8)  Freestyle Test Strp (Glucose blood) .... Check blood sugar as directed  qid as needed 9)  Bd Insulin Syringe Ultrafine 31g X 5/16" 0.3 Ml Misc (Insulin syringe-needle u-100) .... Use as directed 10)  Fish Oil Concentrate 1000 Mg Caps (Omega-3 fatty acids) .Marland Kitchen.. 1 by mouth twice daily  Patient Instructions: 1)  RTC aas sched, discuss blaader dropping. Prescriptions:  LEVEMIR FLEXPEN 100 UNIT/ML  SOLN (INSULIN DETEMIR) Inject 75 units SubQ daily Dx 250.00  #9 vials x 12   Entered and Authorized by:   Shaune Leeks MD   Signed by:   Shaune Leeks MD on 03/20/2009   Method used:   Electronically to        [id number] (retail)       [address]       [place], [place]  [address]       Ph: [phone] or [phone]       Fax: [phone]   RxID:   [id number]

## 2010-12-10 NOTE — Assessment & Plan Note (Signed)
Summary: 3 MONTH FOLLOW UP/RBH   Vital Signs:  Patient Profile:   65 Years Old Female Height:     66 inches Weight:      162 pounds Temp:     98.6 degrees F oral Pulse rate:   80 / minute Pulse rhythm:   regular BP sitting:   140 / 70  (left arm) Cuff size:   regular  Vitals Entered By: Providence Crosby (December 20, 2007 8:55 AM)                 Chief Complaint:  3 month followup.  History of Present Illness: Here for f/u DM, tried to decrease metformin at night and increase Levemir but AM nos elevated and stayed there approx 140s. She therefore recently continued elevated Levemir but increased Metformin back to old dose. Is tolerating that fine and Am nos have gone back down to 100s.  Her A1C, even with higher AM nos has decreased so her current regimen should improve that even more. Was also sent for relook DEXA which continues to show progression toward osteoporosis...is currently osteopenic. Will get Vit D, have her start Calcium 500-600mg  two times a day(she was supposed to be on this previously) and start Vit D if so indicated.    Prior Medication List:  NASONEX 50 MCG/ACT SUSP (MOMETASONE FUROATE)  AVANDIA 8 MG TABS (ROSIGLITAZONE MALEATE)  QUINAPRIL HCL 20 MG TABS (QUINAPRIL HCL) Take one by mouth daily LIPITOR 10 MG TABS (ATORVASTATIN CALCIUM) Take one by mouth at bedtime HYDROCHLOROTHIAZIDE 25 MG TABS (HYDROCHLOROTHIAZIDE) Take one by mouth daily METFORMIN HCL 1000 MG  TABS (METFORMIN HCL) Take one by mouth at bedtime LEVEMIR FLEXPEN 100 UNIT/ML  SOLN (INSULIN DETEMIR) Inject 63 units SubQ daily * FISH OIL CAPSULES Take one by mouth two times a day ALLEGRA 180 MG  TABS (FEXOFENADINE HCL) Take one by mouth daily as needed * METFORMIN 500MG  1 q am   Current Allergies (reviewed today): PENICILLIN G POTASSIUM (PENICILLIN G POTASSIUM) * SULFA (SULFONAMIDES) GROUP      Physical Exam  General:      Well-developed,well-nourished,in no acute distress; alert,appropriate and cooperative throughout examination Head:     Normocephalic and atraumatic without obvious abnormalities. No apparent alopecia or balding. Eyes:     Conjunctiva clear bilaterally.  Ears:     External ear exam shows no significant lesions or deformities.  Otoscopic examination reveals clear canals, tympanic membranes are intact bilaterally without bulging, retraction, inflammation or discharge. Hearing is grossly normal bilaterally. Nose:     External nasal examination shows no deformity or inflammation. Nasal mucosa are pink and moist without lesions or exudates. Mouth:     Oral mucosa and oropharynx without lesions or exudates.  Teeth in good repair. Neck:     No deformities, masses, or tenderness noted. Lungs:     Normal respiratory effort, chest expands symmetrically. Lungs are clear to auscultation, no crackles or wheezes. Heart:     Normal rate and regular rhythm. S1 and S2 normal without gallop, murmur, click, rub or other extra sounds. Abdomen:     Bowel sounds positive,abdomen soft and non-tender without masses, organomegaly or hernias noted.    Impression & Recommendations:  Problem # 1:  OSTEOPENIA (ICD-733.90) Assessment: Deteriorated Start Calcium 500-600mg  two times a day, test Vit D today and relace if needed.  Recheck DEXA in two years. Orders: T- * Misc. Laboratory test [phone])   Problem # 2:  HYPERTENSION (ON ACE SECONDARY DM PRIOR) (ICD-401.9) Increase Quinapril  to two times a day. Her updated medication list for this problem includes:    Quinapril Hcl 20 Mg Tabs (Quinapril hcl) .Marland Kitchen... Take one by mouth twice daily    Hydrochlorothiazide 25 Mg Tabs (Hydrochlorothiazide) .Marland Kitchen... 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  Problem # 4:  DIABETES MELLITUS, TYPE II (ICD-250.00) Improved, continue as currently doing. Her updated medication list for this problem includes:    Avandia 8 Mg Tabs (Rosiglitazone maleate)    Quinapril Hcl 20 Mg Tabs (Quinapril hcl) .Marland Kitchen... Take one by mouth twice daily    Metformin Hcl 1000 Mg Tabs (Metformin hcl) .Marland Kitchen... Take one by mouth at bedtime    Levemir Flexpen 100 Unit/ml Soln (Insulin detemir) ..... Inject 63 units subq daily    Metformin Hcl 500 Mg Tabs (Metformin hcl) .Marland Kitchen... 1 in the morning  Labs Reviewed: HgBA1c: 7.7 (12/18/2007)   Creat: 0.4 (09/18/2007)   Microalbumin: 29.5 (03/31/2006)  Last Eye Exam: normal (12/20/2006)   Complete Medication List: 1)  Nasonex 50 Mcg/act Susp (Mometasone furoate) 2)  Avandia 8 Mg Tabs (Rosiglitazone maleate) 3)  Quinapril Hcl 20 Mg Tabs (Quinapril hcl) .... Take one by mouth twice daily 4)  Lipitor 10 Mg Tabs (Atorvastatin calcium) .... Take one by mouth at bedtime 5)  Hydrochlorothiazide 25 Mg Tabs (Hydrochlorothiazide) .... Take one by mouth daily 6)  Metformin Hcl 1000 Mg Tabs (Metformin hcl) .... Take one by mouth at bedtime 7)  Levemir Flexpen 100 Unit/ml Soln (Insulin detemir) .... Inject 63 units subq daily 8)  Fish Oil Capsules  .... Take one by mouth two times a day 9)  Allegra 180 Mg Tabs (Fexofenadine hcl) .... Take one by mouth daily as needed 10)  Metformin Hcl 500 Mg Tabs (Metformin hcl) .Marland Kitchen.. 1 in the morning   Patient Instructions: 1)  RTC 3 mos, A1C 250.00 prior 2)  25 mins spent with pt.    Prescriptions: QUINAPRIL HCL 20 MG TABS (QUINAPRIL HCL) Take one by mouth twice daily  #60 x 12   Entered and Authorized by:   Shaune Leeks MD   Signed by:   Shaune Leeks MD on 12/20/2007   Method used:   [facility] to ...        [id number]       [address] [place], [place]  [address]       Ph: [phone] or [phone]       Fax: [phone]   RxID:   [id number]  ]  Appended Document: 3 MONTH FOLLOW UP/RBH check calcium next time.

## 2010-12-10 NOTE — Letter (Signed)
Summary: Results Follow up Letter   at [facility]  [address] [place], [place] [address]   Phone: [phone]  Fax: [phone]    06/17/2010 MRN: [id number]    [facility] [address] [place], [place]  [address]    Dear Gloria [name],  The following are the results of your recent test(s):  Test         Result    Pap Smear:        Normal _____  Not Normal _____ Comments: ______________________________________________________ Cholesterol: LDL(Bad cholesterol):         Your goal is less than:         HDL (Good cholesterol):       Your goal is more than: Comments:  ______________________________________________________ Mammogram:        Normal __X___  Not Normal _____ Comments:   Follow up is recommended in 6 months.  Please let us know if we need to schedule this for you.  ___________________________________________________________________ Hemoccult:        Normal _____  Not normal _______ Comments:    _____________________________________________________________________ Other Tests:    We routinely do not discuss normal results over the telephone.  If you desire a copy of the results, or you have any questions about this information we can discuss them at your next office visit.   Sincerely,   Dwana Curd. Para March, M.D.  [facility]

## 2010-12-10 NOTE — Assessment & Plan Note (Signed)
Summary: 3 M F/U DLO   Vital Signs:  Patient profile:   64 year old female Weight:      163 pounds Temp:     98.6 degrees F oral Pulse rate:   88 / minute Pulse rhythm:   regular BP sitting:   140 / 86  (left arm) Cuff size:   regular  Vitals Entered By: Sydell Axon LPN (April 29, 2010 9:00 AM) CC: 3 Month follow-up   History of Present Illness: Pt here for followup. Her urinary sxs have gone. She finishes Cipro today. She has known stones in the kidney which have nnot boithered her.  She is here for BP recheck.  She continues to use insulin. She has 80s occas but typically over 100. She has a suspicion her meter is low.  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 1 Spray Each Nostril Every Day or As Needed 2)  Quinapril Hcl 20 Mg Tabs (Quinapril Hcl) .... Take One By Mouth Twice Daily 3)  Lipitor 10 Mg Tabs (Atorvastatin Calcium) .... Take One By Mouth At Bedtime 4)  Hydrochlorothiazide 25 Mg Tabs (Hydrochlorothiazide) .... Take One By Mouth Daily 5)  Metformin Hcl 1000 Mg  Tabs (Metformin Hcl) .... Take One By Mouth Twice A Day. 6)  Levemir Flexpen 100 Unit/ml  Soln (Insulin Detemir) .... Inject 76 Units Subq Daily Dx 250.00 7)  Freestyle Test   Strp (Glucose Blood) .... Check Blood Sugar  As Directed Qid As Needed 8)  Fish Oil Concentrate 1000 Mg  Caps (Omega-3 Fatty Acids) .Marland Kitchen.. 1 By Mouth Twice Daily 9)  Vitamin D 1000 Unit Tabs (Cholecalciferol) .Marland Kitchen.. 1 Daily By Mouth 10)  Bd Pen Needle Short U/f 31g X 8 Mm Misc (Insulin Pen Needle) .... Use As Directed 11)  Aspirin 81 Mg Tbec (Aspirin) .... Take One By Mouth Daily 12)  Cipro 500 Mg Tabs (Ciprofloxacin Hcl) .... One Tab By Mouth Two Times A Day For Ten Days.  Allergies: 1)  Penicillin G Potassium (Penicillin G Potassium) 2)  * Sulfa (Sulfonamides) Group  Physical Exam  General:  Well-developed,well-nourished,in no acute distress; alert,appropriate and cooperative throughout examination, nontoxic. Head:  Normocephalic and atraumatic without obvious abnormalities. No apparent alopecia or balding. Eyes:  Conjunctiva clear bilaterally.  Ears:  External ear exam shows no significant lesions or deformities.  Otoscopic examination reveals clear canals, tympanic membranes are intact bilaterally without bulging, retraction, inflammation or discharge. Hearing is grossly normal bilaterally. Nose:  External nasal examination shows no deformity or inflammation. Nasal mucosa are pink and moist without lesions or exudates. Mouth:  Oral mucosa and oropharynx without lesions or exudates.  Teeth in good repair. Neck:  No deformities, masses, or tenderness noted. Lungs:  Normal respiratory effort, chest expands symmetrically. Lungs are clear to auscultation, no crackles or wheezes. Heart:  Normal  rate and regular rhythm. S1 and S2 normal without gallop, murmur, click, rub or other extra sounds.   Impression & Recommendations:  Problem # 1:  UTI (ICD-599.0) Assessment Improved Essentially resolved. Her updated medication list for this problem includes:    Cipro 500 Mg Tabs (Ciprofloxacin hcl) ..... One tab by mouth two times a day for ten days.  Problem # 2:  HYPERTENSION (ON ACE SECONDARY DM PRIOR) (ICD-401.9) Assessment:  Unchanged  Will follow for now and let serttle out. Add med in Oct if still high. Her updated medication list for this problem includes:    Quinapril Hcl 20 Mg Tabs (Quinapril hcl) .Marland Kitchen... Take one by mouth twice daily    Hydrochlorothiazide 25 Mg Tabs (Hydrochlorothiazide) .Marland Kitchen... Take one by mouth daily  BP today: 140/86 Prior BP: 130/80 (04/20/2010)  Labs Reviewed: K+: 4.0 (12/03/2009) Creat: : 0.4 (12/03/2009)   Chol: 126 (12/03/2009)   HDL: 36.80 (12/03/2009)   LDL: 77 (12/03/2009)   TG: 63.0 (12/03/2009)  Problem # 3:  DIABETES MELLITUS, TYPE II (ICD-250.00) Assessment: Unchanged Add 3 units tomm and then another 2 in one week. Get new meter through drug store. Her updated medication list for this problem includes:    Quinapril Hcl 20 Mg Tabs (Quinapril hcl) .Marland Kitchen... Take one by mouth twice daily    Metformin Hcl 1000 Mg Tabs (Metformin hcl) .Marland Kitchen... Take one by mouth twice a day.    Levemir Flexpen 100 Unit/ml Soln (Insulin detemir) ..... Inject 80 units subq daily dx 250.00    Aspirin 81 Mg Tbec (Aspirin) .Marland Kitchen... Take one by mouth daily  Complete Medication List: 1)  Nasonex 50 Mcg/act Susp (Mometasone furoate) .Marland Kitchen.. 1 spray each nostril every day or as needed 2)  Quinapril Hcl 20 Mg Tabs (Quinapril hcl) .... Take one by mouth twice daily 3)  Lipitor 10 Mg Tabs (Atorvastatin calcium) .... Take one by mouth at bedtime 4)  Hydrochlorothiazide 25 Mg Tabs (Hydrochlorothiazide) .... Take one by mouth daily 5)  Metformin Hcl 1000 Mg Tabs (Metformin hcl) .... Take one by mouth twice a day. 6)  Levemir Flexpen 100 Unit/ml Soln (Insulin detemir) .... Inject 80 units subq daily dx 250.00 7)  Freestyle Test Strp (Glucose blood) .... Check blood sugar as directed four times a day as needed 8)  Fish Oil Concentrate 1000 Mg Caps (Omega-3 fatty acids) .Marland Kitchen.. 1 by mouth twice daily 9)  Vitamin D 1000 Unit Tabs (Cholecalciferol) .Marland Kitchen.. 1 daily by mouth 10)  Bd Pen Needle Short U/f 31g X 8 Mm Misc (Insulin pen  needle) .... Use as directed 11)  Aspirin 81 Mg Tbec (Aspirin) .... Take one by mouth daily 12)  Cipro 500 Mg Tabs (Ciprofloxacin hcl) .... One tab by mouth two times a day for ten days.  Patient Instructions: 1)  RTC in oct, A1C prior  Current Allergies (reviewed today): PENICILLIN G POTASSIUM (PENICILLIN G POTASSIUM) * SULFA (SULFONAMIDES) GROUP

## 2010-12-10 NOTE — Progress Notes (Signed)
Summary: dexa orders  Phone Note Call from Patient Call back at Home Phone [phone]   Caller: Patient Call For: schaller Summary of Call: pt needs to have orders for bone density sent to [facility], Initial call taken by: Liane Comber,  December 04, 2007 9:52 AM  Follow-up for Phone Call        Referral requested. Follow-up by: Shaune Leeks MD,  December 04, 2007 5:47 PM  Additional Follow-up for Phone Call Additional follow up Details #1::        Order for DEXA by Dr. Hetty Ely  faxed to [facility]. Additional Follow-up by: Mickle Asper,  December 05, 2007 11:01 AM

## 2010-12-10 NOTE — Letter (Signed)
Summary: Results Follow up Letter  [place] [facility]  [address] [place], [place] [address]   Phone: [phone]  Fax: [phone]    09/26/2007 MRN: [id number]  [facility] [address] [place], [place]  [address]  Dear Ms. Colley,  The following are the results of your recent test(s):  Test         Result    Pap Smear:        Normal _x____  Not Normal _____ Comments:your pap smear report was normal . Please make sure to repeat in one year. Enclosed is a copy of your report. ______________________________________________________ Cholesterol: LDL(Bad cholesterol):         Your goal is less than:         HDL (Good cholesterol):       Your goal is more than: Comments:  ______________________________________________________ Mammogram:        Normal _____  Not Normal _____ Comments:  ___________________________________________________________________ Hemoccult:        Normal _____  Not normal _______ Comments:    _____________________________________________________________________ Other Tests:    We routinely do not discuss normal results over the telephone.  If you desire a copy of the results, or you have any questions about this information we can discuss them at your next office visit.   Sincerely,

## 2010-12-10 NOTE — Assessment & Plan Note (Signed)
Summary: CPX  CYD   Vital Signs:  Patient profile:   65 year old female Weight:      158.75 pounds Temp:     98.8 degrees F oral Pulse rate:   84 / minute Pulse rhythm:   regular BP sitting:   128 / 70  (left arm) Cuff size:   regular  Vitals Entered By: Sydell Axon LPN (December 16, 2009 9:25 AM) CC: 30 Minute checkup, hemoccult cards given to patient, had a hysterectomy 10/10   History of Present Illness: Pt here for Comp Exam, had Vag Hyst in 10/10 for prolapsed bladder. She had stitches removed post surgery. She bleeds after intercourse now and has pain regularly.  She otherwise has no complaints except constant headache in the back of her eyes.  Preventive Screening-Counseling & Management  Alcohol-Tobacco     Alcohol drinks/day: 0     Alcohol type: wine rarely     Smoking Status: never     Passive Smoke Exposure: no  Caffeine-Diet-Exercise     Caffeine use/day: 2     Does Patient Exercise: yes     Type of exercise: walks daily     Times/week: 7  Problems Prior to Update: 1)  Dysuria  (ICD-788.1) 2)  Disruption of Internal Operation Surgical Wound  (ICD-998.31) 3)  Osteopenia  (ICD-733.90) 4)  Special Screening Malig Neoplasms Other Sites  (ICD-V76.49) 5)  Health Maintenance Exam  (ICD-V70.0) 6)  Hypertension (ON ACE SECONDARY DM PRIOR)  (ICD-401.9) 7)  Gilbert's Syndrome  (ICD-277.4) 8)  Carcinoma, 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 1 Spray Each Nostril Every Day or As Needed 2)  Quinapril Hcl 20 Mg Tabs (Quinapril Hcl) .... Take One By Mouth Twice Daily 3)  Lipitor 10 Mg Tabs (Atorvastatin Calcium) .... Take One By Mouth At Bedtime 4)   Hydrochlorothiazide 25 Mg Tabs (Hydrochlorothiazide) .... Take One By Mouth Daily 5)  Metformin Hcl 1000 Mg  Tabs (Metformin Hcl) .... Take One By Mouth Twice A Day. 6)  Levemir Flexpen 100 Unit/ml  Soln (Insulin Detemir) .... Inject 64 Units Subq Daily Dx 250.00 7)  Freestyle Test   Strp (Glucose Blood) .... Check Blood Sugar As Directed Qid As Needed 8)  Fish Oil Concentrate 1000 Mg  Caps (Omega-3 Fatty Acids) .Marland Kitchen.. 1 By Mouth Twice Daily 9)  Vitamin D 1000 Unit Tabs (Cholecalciferol) .Marland Kitchen.. 1 Daily By Mouth 10)  Bd Pen Needle Short U/f 31g X 8 Mm Misc (Insulin Pen Needle) .... Use As Directed  Allergies: 1)  Penicillin G Potassium (Penicillin G Potassium) 2)  * Sulfa (Sulfonamides) Group  Past History:  Family History: Last updated: 2007/05/26 Father: Died early from a motor vehicle accident Mother: Died in [id number] at 87 years of age from hypertension and congestive heart failure. Siblings: Only child CV- P Uncles x 2/ MI HBP + Mother DM + P. Aunt at 74 yoa Breast cancer - cousins x 2 ETOH + Cousins + Stroke P. Aunt, M. Aunt  Social History: Last updated: 26-May-2007 Marital Status: Married Children: 2 children out of the home Occupation: Retired Runner, broadcasting/film/video from the [employer]  Risk Factors: Alcohol Use: 0 (12/16/2009) Caffeine Use: 2 (12/16/2009) Exercise:  yes (12/16/2009)  Risk Factors: Smoking Status: never (12/16/2009) Passive Smoke Exposure: no (12/16/2009)  Past Medical History: Reviewed history from 05/24/2007 and no changes required. Diabetes mellitus, type II Hypertension  Past Surgical History: NSVD x 2 Carpal Tunnel w/ Bilateral Releases R inguinal Hernia Repair (Dr Maple Hudson) L Breast Cystectomy R Lumpectomy B9  DEXA- Osteopenia Recheck 2 years 10/01 Uteroscopy without laser secondary stone 11/01 DEXA- Osteopenia has progressed  ~ 15% 09/10/2002 DEXA- Osteoipenia mildly Progressed   12/06/2007 HOSP [facility] Urol Proc 10/22-10/24/10 [facility] for vaginal  prolapse, Transvag Tape w/ Varitensor, Ant & Post Colporraphy, Uterosacral Lig susp, McCall               Culdoplasty 08/29/09  Social History: Caffeine use/day:  2  Review of Systems General:  Complains of sweats; denies chills, fatigue, fever, weakness, and weight loss. Eyes:  Denies blurring, discharge, eye irritation, and eye pain; has eye exam in April. ENT:  Complains of ringing in ears; denies decreased hearing and earache; chronic. CV:  Denies chest pain or discomfort, fainting, fatigue, palpitations, shortness of breath with exertion, and swelling of feet. Resp:  Denies chest pain with inspiration, cough, shortness of breath, and wheezing. GI:  Denies abdominal pain, bloody stools, change in bowel habits, constipation, dark tarry stools, diarrhea, indigestion, loss of appetite, nausea, vomiting, vomiting blood, and yellowish skin color. GU:  Denies discharge, dysuria, and nocturia; some bloody discharge . MS:  Denies joint pain, joint swelling, low back pain, muscle aches, muscle weakness, and stiffness. Derm:  Denies dryness, itching, and rash. Neuro:  Denies numbness, poor balance, tingling, and tremors.  Physical Exam  General:  Well-developed,well-nourished,in no acute distress; alert,appropriate and cooperative throughout examination Head:  Normocephalic and atraumatic without obvious abnormalities. No apparent alopecia or balding. Eyes:  Conjunctiva clear bilaterally.  Ears:  External ear exam shows no significant lesions or deformities.  Otoscopic examination reveals clear canals, tympanic membranes are intact bilaterally without bulging, retraction, inflammation or discharge. Hearing is grossly normal bilaterally. Nose:  External nasal examination shows no deformity or inflammation. Nasal mucosa are pink and moist without lesions or exudates. Mouth:  Oral mucosa and oropharynx without lesions or exudates.  Teeth in good repair. Neck:  No deformities, masses, or tenderness  noted. Chest Wall:  No deformities, masses, or tenderness noted. Breasts:  No mass, nodules, thickening, tenderness, bulging, retraction, inflamation, nipple discharge or skin changes noted.   Lungs:  Normal respiratory effort, chest expands symmetrically. Lungs are clear to auscultation, no crackles or wheezes. Heart:  Normal rate and regular rhythm. S1 and S2 normal without gallop, murmur, click, rub or other extra sounds.   Impression & Recommendations:  Problem # 1:  HEALTH MAINTENANCE EXAM (ICD-V70.0)  Problem # 2:  DISRUPTION OF INTERNAL OPERATION SURGICAL WOUND (ICD-998.31) Pt has multiple sutures emanating into the vagina that appear to be at the vaginal cuff in a circular distribution. One suture was losse and cams ou easily. I could not reach the others as I did not have a hemostat long enough to grab. Will have the pt call Dr Mia Creek for further directions.  Problem # 3:  OSTEOPENIA (ICD-733.90) Assessment: Improved Cont curr meds. Her updated medication list for this problem includes:    Vitamin D 1000 Unit Tabs (Cholecalciferol) .Marland Kitchen... 1 daily by mouth  Vit D:31 (12/03/2009), 71 (11/28/2008)Parathyroid:32.5 (12/20/2007)  Problem # 4:  HYPERTENSION (ON ACE SECONDARY DM PRIOR) (ICD-401.9) Assessment: Improved Cont curr meds. Her updated medication list for this problem includes:  Quinapril Hcl 20 Mg Tabs (Quinapril hcl) .Marland Kitchen... Take one by mouth twice daily    Hydrochlorothiazide 25 Mg Tabs (Hydrochlorothiazide) .Marland Kitchen... Take one by mouth daily  BP today: 128/70 Prior BP: 140/70 (07/07/2009)  Labs Reviewed: K+: 4.0 (12/03/2009) Creat: : 0.4 (12/03/2009)   Chol: 126 (12/03/2009)   HDL: 36.80 (12/03/2009)   LDL: 77 (12/03/2009)   TG: 63.0 (12/03/2009)  Problem # 5:  DIABETES MELLITUS, TYPE II (ICD-250.00) Discussed self titrating by increasing 3 units when three consecutive days fasting sugar >120. Recheck as scheduled, 6 weeks. Her updated medication list for this problem  includes:    Quinapril Hcl 20 Mg Tabs (Quinapril hcl) .Marland Kitchen... Take one by mouth twice daily    Metformin Hcl 1000 Mg Tabs (Metformin hcl) .Marland Kitchen... Take one by mouth twice a day.    Levemir Flexpen 100 Unit/ml Soln (Insulin detemir) ..... Inject 70 units subq daily dx 250.00    Aspirin 81 Mg Tbec (Aspirin) .Marland Kitchen... Take one by mouth daily  Labs Reviewed: Creat: 0.4 (12/03/2009)   Microalbumin: 29.5 (03/31/2006)  Last Eye Exam: normal (12/20/2006) Reviewed HgBA1c results: 7.8 (12/03/2009)  6.9 (06/04/2009)  Problem # 6:  GILBERT'S SYNDROME (ICD-277.4) Assessment: Unchanged Stable.  Problem # 7:  HYPERCALCEMIA ON HCTZ WITH GOOD RESULTS (ICD-275.42) Assessment: Unchanged Continues stable and in therapeutic range.  Problem # 8:  MENOPAUSE, SURGICAL (ICD-627.4)  Superimposed on early natural menopause. Suggest avoiding hormonal trmt if poss.  Discussed treatment options.   Problem # 9:  URI (ICD-465.9) Assessment: New  See instructions. Her updated medication list for this problem includes:    Aspirin 81 Mg Tbec (Aspirin) .Marland Kitchen... Take one by mouth daily  Instructed on symptomatic treatment. Call if symptoms persist or worsen.   Problem # 10:  DYSURIA (ICD-788.1) Assessment: New  Will send urine for culture. U/A neg. Orders: Specimen Handling ([id number]) T-Culture, Urine ([id number]) UA Dipstick w/o Micro (manual) ([id number])  Encouraged to push clear liquids, get enough rest, and take acetaminophen as needed. To be seen in 10 days if no improvement, sooner if worse.  Complete Medication List: 1)  Nasonex 50 Mcg/act Susp (Mometasone furoate) .Marland Kitchen.. 1 spray each nostril every day or as needed 2)  Quinapril Hcl 20 Mg Tabs (Quinapril hcl) .... Take one by mouth twice daily 3)  Lipitor 10 Mg Tabs (Atorvastatin calcium) .... Take one by mouth at bedtime 4)  Hydrochlorothiazide 25 Mg Tabs (Hydrochlorothiazide) .... Take one by mouth daily 5)  Metformin Hcl 1000 Mg Tabs (Metformin hcl) .... Take  one by mouth twice a day. 6)  Levemir Flexpen 100 Unit/ml Soln (Insulin detemir) .... Inject 70 units subq daily dx 250.00 7)  Freestyle Test Strp (Glucose blood) .... Check blood sugar as directed qid as needed 8)  Fish Oil Concentrate 1000 Mg Caps (Omega-3 fatty acids) .Marland Kitchen.. 1 by mouth twice daily 9)  Vitamin D 1000 Unit Tabs (Cholecalciferol) .Marland Kitchen.. 1 daily by mouth 10)  Bd Pen Needle Short U/f 31g X 8 Mm Misc (Insulin pen needle) .... Use as directed 11)  Aspirin 81 Mg Tbec (Aspirin) .... Take one by mouth daily  Other Orders: UA Dipstick W/ Micro (manual) ([id number])  Patient Instructions: 1)  RTC 6 weeks , A1C and FBS 250.00 prior 2)  Take Guaifenesin by going to [facility] or [facility] and getting MUCOUS RELIEF EXPECTORANT (400mg ), take 11/2 tabs by mouth AM and NOON. 3)  Drink lots of fluids anytime taking Guaifenesin.  4)  U/A today. 5)  Incr  Levemir in titration as discussed.  Current Allergies (reviewed today): PENICILLIN G POTASSIUM (PENICILLIN G POTASSIUM) * SULFA (SULFONAMIDES) GROUP  Laboratory Results   Urine Tests  Date/Time Received: December 16, 2009 10:25 AM  Date/Time Reported: December 16, 2009 10:25 AM   Routine Urinalysis   Color: yellow Appearance: Cloudy Glucose: 100   (Normal Range: Negative) Bilirubin: negative   (Normal Range: Negative) Ketone: negative   (Normal Range: Negative) Spec. Gravity: 1.020   (Normal Range: 1.003-1.035) Blood: large   (Normal Range: Negative) pH: 6.0   (Normal Range: 5.0-8.0) Protein: negative   (Normal Range: Negative) Urobilinogen: 0.2   (Normal Range: 0-1) Nitrite: negative   (Normal Range: Negative) Leukocyte Esterace: negative   (Normal Range: Negative)

## 2010-12-10 NOTE — Letter (Signed)
Summary: [facility] Instructions  [facility]  [address] [place], [place] [address]   Phone: [phone]  Fax: [phone]       Gloria [name]    06-15-1946    MRN: [id number]        Procedure Day /Date: Monday 10-12-10     Arrival Time: 7:30 am     Procedure Time: 8:30 am     Location of Procedure:                    _x _  [facility] (4th Floor)   PREPARATION FOR COLONOSCOPY WITH MOVIPREP   Starting 5 days prior to your procedure  10-07-10 do not eat nuts, seeds, popcorn, corn, beans, peas,  salads, or any raw vegetables.  Do not take any fiber supplements (e.g. Metamucil, Citrucel, and Benefiber).  THE DAY BEFORE YOUR PROCEDURE         DATE:  10-11-10  DAY:  Sunday  1.  Drink clear liquids the entire day-NO SOLID FOOD  2.  Do not drink anything colored red or purple.  Avoid juices with pulp.  No orange juice.  3.  Drink at least 64 oz. (8 glasses) of fluid/clear liquids during the day to prevent dehydration and help the prep work efficiently.  CLEAR LIQUIDS INCLUDE: Water Jello Ice Popsicles Tea (sugar ok, no milk/cream) Powdered fruit flavored drinks Coffee (sugar ok, no milk/cream) Gatorade Juice: apple, white grape, white cranberry  Lemonade Clear bullion, consomm, broth Carbonated beverages (any kind) Strained chicken noodle soup Hard Candy                             4.  In the morning, mix first dose of MoviPrep solution:    Empty 1 Pouch A and 1 Pouch B into the disposable container    Add lukewarm drinking water to the top line of the container. Mix to dissolve    Refrigerate (mixed solution should be used within 24 hrs)  5.  Begin drinking the prep at 5:00 p.m. The MoviPrep container is divided by 4 marks.   Every 15 minutes drink the solution down to the next mark (approximately 8 oz) until the full liter is complete.   6.  Follow completed prep with 16 oz of clear liquid of your choice (Nothing red or purple).   Continue to drink clear liquids until bedtime.  7.  Before going to bed, mix second dose of MoviPrep solution:    Empty 1 Pouch A and 1 Pouch B into the disposable container    Add lukewarm drinking water to the top line of the container. Mix to dissolve    Refrigerate  THE DAY OF YOUR PROCEDURE      DATE:  10-12-10  DAY:  Monday  Beginning at  3:30 a.m. (5 hours before procedure):         1. Every 15 minutes, drink the solution down to the next mark (approx 8 oz) until the full liter is complete.  2. Follow completed prep with 16 oz. of clear liquid of your choice.    3. You may drink clear liquids until  6:30 a.m. (2 HOURS BEFORE PROCEDURE).   MEDICATION INSTRUCTIONS  Unless otherwise instructed, you should take regular prescription medications with a small sip of water   as early as possible the morning of your procedure.  Diabetic patients - see separate instructions.   Additional medication  instructions: Do not take HCTZ day of procedure.         OTHER INSTRUCTIONS  You will need a responsible adult at least 66 years of age to accompany you and drive you home.   This person must remain in the waiting room during your procedure.  Wear loose fitting clothing that is easily removed.  Leave jewelry and other valuables at home.  However, you may wish to bring a book to read or  an iPod/MP3 player to listen to music as you wait for your procedure to start.  Remove all body piercing jewelry and leave at home.  Total time from sign-in until discharge is approximately 2-3 hours.  You should go home directly after your procedure and rest.  You can resume normal activities the  day after your procedure.  The day of your procedure you should not:   Drive   Make legal decisions   Operate machinery   Drink alcohol   Return to work  You will receive specific instructions about eating, activities and medications before you leave.    The above instructions have  been reviewed and explained to me by  Ezra Sites RN  September 28, 2010 9:57 AM    I fully understand and can verbalize these instructions _____________________________ Date _________

## 2010-12-10 NOTE — Letter (Signed)
Summary: [facility]   Imported By: Beau Fanny 09/03/2009 16:30:18  _____________________________________________________________________  External Attachment:    Type:   Image     Comment:   External Document  Appended Document: [facility]    Clinical Lists Changes  Observations: Added new observation of PAST SURG HX: NSVD x 2 Carpal Tunnel w/ Bilateral Releases R inguinal Hernia Repair (Dr Maple Hudson) L Breast Cystectomy R Lumpectomy B9  DEXA- Osteopenia Recheck 2 years 10/01 Uteroscopy without laser secondary stone 11/01 DEXA- Osteopenia has progressed  ~ 15% 09/10/2002 DEXA- Osteoipenia mildly Progressed   12/06/2007 HOSP [facility] Urol Proc 10/22-10/24/10 [facility], Wilhelmina Mcardle Tape w/ Varitensor, Ant & Post Colporraphy, Uterosacral Lig susp, McCall Culdoplasty 08/29/09 (09/03/2009 17:10)       Past Surgical History:    NSVD x 2    Carpal Tunnel w/ Bilateral Releases    R inguinal Hernia Repair (Dr Maple Hudson)    L Breast Cystectomy R Lumpectomy B9     DEXA- Osteopenia Recheck 2 years 10/01    Uteroscopy without laser secondary stone 11/01    DEXA- Osteopenia has progressed  ~ 15% 09/10/2002    DEXA- Osteoipenia mildly Progressed   12/06/2007    HOSP [facility] Urol Proc 10/22-10/24/10    [facility], Wilhelmina Mcardle Tape w/ Varitensor, Ant & Post Colporraphy, Uterosacral Lig susp, Rogelio Seen Culdoplasty 08/29/09

## 2010-12-10 NOTE — Letter (Signed)
Summary: Results Follow up Letter  [place] [facility]  [address] [place], [place] [address]   Phone: [phone]  Fax: [phone]    10/09/2007 MRN: [id number]  [facility] [address] [place], [place]  [address]  Dear Ms. Galeas,  The following are the results of your recent test(s):  Test         Result    Pap Smear:        Normal _____  Not Normal _____ Comments: ______________________________________________________ Cholesterol: LDL(Bad cholesterol):         Your goal is less than:         HDL (Good cholesterol):       Your goal is more than: Comments:  ______________________________________________________ Mammogram:        Normal _____  Not Normal _____ Comments:  ___________________________________________________________________ Hemoccult:        Normal _X____  Not normal _______ Comments:  NO BLOOD IN STOOL. THANK YOU FOR RETURNING THE HEMOCULT CARDS. PLEASE MAKE SURE TO REPEAT IN ONE YEAR.  _____________________________________________________________________ Other Tests:    We routinely do not discuss normal results over the telephone.  If you desire a copy of the results, or you have any questions about this information we can discuss them at your next office visit.   Sincerely,

## 2010-12-10 NOTE — Letter (Signed)
Summary: Results Letter  [facility]  [address] [place], [place] [address]   Phone: [phone]  Fax: [phone]        October 14, 2010 MRN: [id number]    [facility] [address] [place], [place]  [address]    Dear Ms. Iiams,   One of the polyps removed during your recent procedure was proven to be adenomatous.  These are pre-cancerous polyps that may have grown into cancers if they had not been removed.  Based on current nationally recognized surveillance guidelines, I recommend that you have a repeat colonoscopy in 5 years.  We will therefore put your information in our reminder system and will contact you in 5 years to schedule a repeat procedure.  Please call if you have any questions or concerns.       Sincerely,  Rachael Fee MD  This letter has been electronically signed by your physician.  Appended Document: Results Letter Letter mailed

## 2010-12-10 NOTE — Assessment & Plan Note (Signed)
Summary: 3 MONTH FOLLOW UP/[facility]   Vital Signs:  Patient Profile:   65 Years Old Female Weight:      156 pounds Temp:     98.6 degrees F oral Pulse rate:   76 / minute Pulse rhythm:   regular BP sitting:   122 / 70  (left arm) Cuff size:   regular  Vitals Entered By: Providence Crosby (May 29, 2007 9:09 AM)               Chief Complaint:  3 MONTH F/U.  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 Does lots more up there and nos therefore lower. No complaints.  Current Allergies (reviewed today): PENICILLIN G POTASSIUM (PENICILLIN G POTASSIUM) * SULFA (SULFONAMIDES) GROUP      Physical Exam  General:     Well-developed,well-nourished,in no acute distress; alert,appropriate and cooperative throughout examination Head:     Normocephalic and atraumatic without obvious abnormalities. No apparent alopecia or balding. Eyes:     Conjunctiva clear bilaterally.  Ears:     External ear exam shows no significant lesions or deformities.  Otoscopic examination reveals clear canals, tympanic membranes are intact bilaterally without bulging, retraction, inflammation or discharge. Hearing is grossly normal bilaterally. Nose:     External nasal examination shows no deformity or inflammation. Nasal mucosa are pink and moist without lesions or exudates. Mouth:     Oral mucosa and oropharynx without lesions or exudates.  Teeth in good repair. Neck:     No deformities, masses, or tenderness noted. Chest Wall:     No deformities, masses, or tenderness noted. Lungs:     Normal respiratory effort, chest expands symmetrically. Lungs are clear to auscultation, no crackles or wheezes. Heart:     Normal rate and regular rhythm. S1 and S2 normal without gallop, murmur, click, rub or other extra sounds.    Impression & Recommendations:  Problem # 1:  DIABETES MELLITUS, TYPE II (ICD-250.00)  Assessment: Improved Diary looks good, will check nos in future.  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 The following medications were removed from the medication list:    Glimepiride 2 Mg Tabs (Glimepiride)  Her updated medication list for this problem includes:    Metformin Hcl 500 Mg Tb24 (Metformin hcl) .Marland Kitchen... Take one by mouth every morning    Avandia 8 Mg Tabs (Rosiglitazone maleate)    Quinapril Hcl 20 Mg Tabs (Quinapril hcl) .Marland Kitchen... Take one by mouth daily    Metformin Hcl 1000 Mg Tabs (Metformin hcl) .Marland Kitchen... Take one by mouth at bedtime    Levemir Flexpen 100 Unit/ml Soln (Insulin detemir) ..... Inject 10 units subq dailyup to 58 units per day  Labs Reviewed: HgBA1c: 9.7 (01/03/2007)   Microalbumin: 29.5 (03/31/2006)   Problem # 2:  HYPERTENSION (ON ACE SECONDARY DM PRIOR) (ICD-401.9) Assessment: Improved  Her updated medication list for this problem includes:    Quinapril Hcl 20 Mg Tabs (Quinapril hcl) .Marland Kitchen... Take one by mouth daily    Hydrochlorothiazide 25 Mg Tabs (Hydrochlorothiazide) .Marland Kitchen... Take one by mouth daily  BP today: 122/70   Medications Added to Medication List This Visit: 1)  Levemir Flexpen 100 Unit/ml Soln (Insulin detemir) .... Inject 10 units subq dailyup to 58 units per day   Patient Instructions: 1)  Please schedule next avail Comp Exam, labs prior 2)  BMET 401.1  Chol prof, Hepatic panel, TSH  272.2 3)  HA1C  urine Microalb  401.1

## 2010-12-10 NOTE — Procedures (Signed)
Summary: Colonoscopy  Patient: [name] Note: All result statuses are Final unless otherwise noted.  Tests: (1) Colonoscopy (COL)   COL Colonoscopy           DONE     [facility]     [address]     [place], [place]  [address]           COLONOSCOPY PROCEDURE REPORT     PATIENT:  Gloria [name], Gloria [name]  MR#:  [id number]     BIRTHDATE:  1946/08/05, 64 yrs. old  GENDER:  female     ENDOSCOPIST:  Rachael Fee, MD     REF. BY:  Crawford Givens, M.D.     PROCEDURE DATE:  10/12/2010     PROCEDURE:  Colonoscopy with snare polypectomy     ASA CLASS:  Class II     INDICATIONS:  Routine Risk Screening     MEDICATIONS:   Fentanyl 50 mcg IV, Versed 5 mg IV     DESCRIPTION OF PROCEDURE:   After the risks benefits and     alternatives of the procedure were thoroughly explained, informed     consent was obtained.  Digital rectal exam was performed and     revealed no rectal masses.   The LB [id number] endoscope     was introduced through the anus and advanced to the cecum, which     was identified by both the appendix and ileocecal valve, without     limitations.  The quality of the prep was adequate, using     MoviPrep.  The instrument was then slowly withdrawn as the colon     was fully examined.     <<PROCEDUREIMAGES>>     FINDINGS:  Three sessile polyps were found, all were removed with     cold snare and all were sent to pathology (jar 1). These ranged in     size from 3mm to 5mm, located in transverse, sigmoid and rectum     segments (see image4 and image5).  Mild diverticulosis was found     in the sigmoid to descending colon segments (see image1).     External hemorrhoids were found. These were small and not     thrombosed.  This was otherwise a normal examination of the colon     (see image3, image2, and image8).   Retroflexed views in the     rectum revealed no abnormalities.    The scope was then withdrawn     from the patient and the procedure completed.  COMPLICATIONS:  None     ENDOSCOPIC IMPRESSION:     1) Three small polyps, all removed and sent to pathology     2) Mild diverticulosis in the sigmoid to descending colon     segments     3) External hemorrhoids     4) Otherwise normal examination           RECOMMENDATIONS:     1) If the polyp(s) removed today are proven to be adenomatous     (pre-cancerous) polyps, you will need a repeat colonoscopy in 5     years. Otherwise you should continue to follow colorectal cancer     screening guidelines for "routine risk" patients with colonoscopy     in 10 years.     2) You will receive a letter within 1-2 weeks with the results     of your biopsy as well as final recommendations. Please call my  office if you have not received a letter after 3 weeks.           ______________________________     Rachael Fee, MD           n.     eSIGNED:   Rachael Fee at 10/12/2010 08:55 AM           [place], [id number]  Note: An exclamation mark (!) indicates a result that was not dispersed into the flowsheet. Document Creation Date: 10/12/2010 8:55 AM _______________________________________________________________________  (1) Order result status: Final Collection or observation date-time: 10/12/2010 08:51 Requested date-time:  Receipt date-time:  Reported date-time:  Referring Physician:   Ordering Physician: Rob Bunting [phone]) Specimen Source:  Source: Launa Grill Order Number: [id number] Lab site:   Appended Document: Colonoscopy     Procedures Next Due Date:    Colonoscopy: 10/2015

## 2010-12-10 NOTE — Letter (Signed)
Summary: Results Follow up Letter  [place] at [facility]  [address] [place], [place] [address]   Phone: [phone]  Fax: [phone]    12/26/2008 MRN: [id number]  [facility] [address] [place], [place]  [address]  Dear Gloria [name],  The following are the results of your recent test(s):  Test         Result    Pap Smear:        Normal _____  Not Normal _____ Comments: ______________________________________________________ Cholesterol: LDL(Bad cholesterol):         Your goal is less than:         HDL (Good cholesterol):       Your goal is more than: Comments:  ______________________________________________________ Mammogram:        Normal _____  Not Normal _____ Comments:  ___________________________________________________________________ Hemoccult:        Normal __x___  Not normal _______ Comments:      No blood in stool. Thank you for returing the hemoccult cards. Please make sure to repeat in one year.  _____________________________________________________________________ Other Tests:    We routinely do not discuss normal results over the telephone.  If you desire a copy of the results, or you have any questions about this information we can discuss them at your next office visit.   Sincerely,

## 2010-12-10 NOTE — Assessment & Plan Note (Signed)
Summary: F/U,CHECK URINE,A1C/CLE   Vital Signs:  Patient profile:   64 year old female Height:      65 inches Weight:      159.50 pounds BMI:     26.64 Temp:     98.5 degrees F oral Pulse rate:   80 / minute Pulse rhythm:   regular BP sitting:   142 / 88  (left arm) Cuff size:   regular  Vitals Entered By: Delilah Shan CMA Brandley Aldrete Dull) (August 27, 2010 10:43 AM) CC: Follow-up visit.  Check urine for leukocytes, A1C   History of Present Illness: Needs flu shot.  Will go to [place] in 01/2011.  I d/w patient re: checking CDC site in 12/11.   Diabetes:  Using medications without difficulties:yes Hypoglycemic episodes:no Hyperglycemic episodes: occ higher at night but back to normal by next AM.  Feet problems:no Blood Sugars averaging:80-100  H/o UTI- recheck ua today wnl.  No recent symptoms.   Never had colonoscopy.  Asking about referral.    2 episodes on atypical abdominal pain.  1 in 9/11 and in 1 in 10/11.  L sided near buttock ("it felt deep"), radiated to R sided on the front of abdomen.  Self resolved.  Preceeded by greasy foods and diet change intially but not with 2nd episode.  No other symptoms in the meantime.  No other complaints. No other assocaited symptoms.   Allergies: 1)  Penicillin G Potassium (Penicillin G Potassium) 2)  * Sulfa (Sulfonamides) Group  Past History:  Past Medical History: Last updated: 05/24/2007 Diabetes mellitus, type II Hypertension  Social History: Last updated: 05/24/2007 Marital Status: Married Children: 2 children out of the home Occupation: Retired Runner, broadcasting/film/video from the [employer]  Review of Systems       See HPI.  Otherwise negative.    Physical Exam  General:  GEN: nad, alert and oriented HEENT: mucous membranes moist NECK: supple w/o LA CV: rrr.  new SEM heard throughout the precordium and radiates up to the carotids bilaterally.  PULM: ctab, no inc wob ABD: soft, +bs, no rebound, no cva pain EXT: no  edema SKIN: no acute rash   Diabetes Management Exam:    Foot Exam (with socks and/or shoes not present):       Sensory-Pinprick/Light touch:          Left medial foot (L-4): normal          Left dorsal foot (L-5): normal          Left lateral foot (S-1): normal          Right medial foot (L-4): normal          Right dorsal foot (L-5): normal          Right lateral foot (S-1): normal       Sensory-Monofilament:          Left foot: diminished          Right foot: diminished       Inspection:          Left foot: normal          Right foot: normal       Nails:          Left foot: normal          Right foot: normal   Impression & Recommendations:  Problem # 1:  DIABETES MELLITUS, TYPE II (ICD-250.00) See notes on labs . Her updated medication list for this problem includes:  Quinapril Hcl 20 Mg Tabs (Quinapril hcl) .Marland Kitchen... Take one by mouth twice daily    Metformin Hcl 1000 Mg Tabs (Metformin hcl) .Marland Kitchen... Take one by mouth twice a day.    Levemir Flexpen 100 Unit/ml Soln (Insulin detemir) ..... Inject 85 units subq daily dx 250.00    Aspirin 81 Mg Tbec (Aspirin) .Marland Kitchen... Take one by mouth daily  Orders: TLB-A1C / Hgb A1C (Glycohemoglobin) (83036-A1C)  Problem # 2:  ABDOMINAL PAIN OTHER SPECIFIED SITE (ICD-789.09) benign exam and no symptoms now.  Will observe and patient to call back if symptoms return.  She agrees.  Her updated medication list for this problem includes:    Aspirin 81 Mg Tbec (Aspirin) .Marland Kitchen... Take one by mouth daily  Problem # 3:  CYSTITIS, ACUTE, HX OF (ICD-V13.09) No further tx now.    Problem # 4:  SPECIAL SCREENING MALIG NEOPLASMS OTHER SITES (ICD-V76.49) Refer.  Orders: Gastroenterology Referral (GI)  Complete Medication List: 1)  Nasonex 50 Mcg/act Susp (Mometasone furoate) .Marland Kitchen.. 1 spray each nostril every day or as needed 2)  Quinapril Hcl 20 Mg Tabs (Quinapril hcl) .... Take one by mouth twice daily 3)  Lipitor 10 Mg Tabs (Atorvastatin calcium) ....  Take one by mouth at bedtime 4)  Hydrochlorothiazide 25 Mg Tabs (Hydrochlorothiazide) .... Take one by mouth daily 5)  Metformin Hcl 1000 Mg Tabs (Metformin hcl) .... Take one by mouth twice a day. 6)  Levemir Flexpen 100 Unit/ml Soln (Insulin detemir) .... Inject 85 units subq daily dx 250.00 7)  Freestyle Lite Test Strp (Glucose blood) .... Check blood sugar 4 times a day 8)  Fish Oil Concentrate 1000 Mg Caps (Omega-3 fatty acids) .Marland Kitchen.. 1 by mouth twice daily 9)  Vitamin D 1000 Unit Tabs (Cholecalciferol) .Marland Kitchen.. 1 daily by mouth 10)  Bd Pen Needle Short U/f 31g X 8 Mm Misc (Insulin pen needle) .... Use as directed 11)  Aspirin 81 Mg Tbec (Aspirin) .... Take one by mouth daily 12)  Freestyle Lancets Misc (Lancets) .... Check blood sugar 4 times a day.  Other Orders: Admin 1st Vaccine ([address]) Flu Vaccine 88yrs + [phone])  Patient Instructions: 1)  See Shirlee Limerick about your referral before your leave today.   2)  You can get your results through our phone system.  Follow the instructions on the blue card. 3)  I would get a physical in 3 months.   4)  Please get cmet/lipid/A1c done ahead of time.  dx 250.00.  5)  Check the CDC.gov in 12/11 for the information on [place].  6)  Check with your insurance to see if they will cover the shingles shot. .    Orders Added: 1)  Est. Patient Level IV [09811] 2)  Admin 1st Vaccine [90471] 3)  Flu Vaccine 70yrs + [[address]] 4)  Gastroenterology Referral [GI] 5)  TLB-A1C / Hgb A1C (Glycohemoglobin) [83036-A1C]    Current Allergies (reviewed today): PENICILLIN G POTASSIUM (PENICILLIN G POTASSIUM) * SULFA (SULFONAMIDES) GROUP Laboratory Results   Urine Tests  Date/Time Received: August 27, 2010 10:55 AM   Routine Urinalysis   Color: yellow Appearance: Clear Glucose: negative   (Normal Range: Negative) Bilirubin: negative   (Normal Range: Negative) Ketone: negative   (Normal Range: Negative) Spec. Gravity: 1.020   (Normal Range:  1.003-1.035) Blood: negative   (Normal Range: Negative) pH: 6.0   (Normal Range: 5.0-8.0) Protein: negative   (Normal Range: Negative) Urobilinogen: 0.2   (Normal Range: 0-1) Nitrite: negative   (Normal Range: Negative) Leukocyte  Esterace: negative   (Normal Range: Negative)      Flu Vaccine Consent Questions     Do you have a history of severe allergic reactions to this vaccine? no    Any prior history of allergic reactions to egg and/or gelatin? no    Do you have a sensitivity to the preservative Thimersol? no    Do you have a past history of Guillan-Barre Syndrome? no    Do you currently have an acute febrile illness? no    Have you ever had a severe reaction to latex? no    Vaccine information given and explained to patient? yes    Are you currently pregnant? no    Lot Number:[id number]   Exp Date:05/08/2011   Site Given  Left Deltoid IM Lugene Fuquay CMA (AAMA)  August 27, 2010 11:42 AM  .lbflu

## 2010-12-10 NOTE — Letter (Signed)
Summary: Results Follow up Letter  [place] at [facility]  [address] [place], [place] [address]   Phone: [phone]  Fax: [phone]    12/31/2009 MRN: [id number]  [facility] [address] [place], [place]  [address]  Dear Ms. Ishikawa,  The following are the results of your recent test(s):  Test         Result    Pap Smear:        Normal _____  Not Normal _____ Comments: ______________________________________________________ Cholesterol: LDL(Bad cholesterol):         Your goal is less than:         HDL (Good cholesterol):       Your goal is more than: Comments:  ______________________________________________________ Mammogram:        Normal _____  Not Normal _____ Comments:  ___________________________________________________________________ Hemoccult:        Normal _X____  Not normal _______ Comments:  Please repeat in one year.  _____________________________________________________________________ Other Tests:    We routinely do not discuss normal results over the telephone.  If you desire a copy of the results, or you have any questions about this information we can discuss them at your next office visit.   Sincerely,     Laurita Quint, MD

## 2010-12-10 NOTE — Assessment & Plan Note (Signed)
Summary: cpx/dlo   Vital Signs:  Patient Profile:   65 Years Old Female Height:     65 inches Weight:      166 pounds Temp:     97.9 degrees F oral Pulse rate:   80 / minute Pulse rhythm:   regular BP sitting:   140 / 80  (left arm) Cuff size:   regular  Vitals Entered By: Providence Crosby (December 05, 2008 1:53 PM)                 Chief Complaint:  check up // hemoccult cards to patient.  History of Present Illness: Pt here for Comp Exam. Has no complaints today and feels well.    Prior Medications Reviewed Using: Patient Recall  Current Allergies (reviewed today): PENICILLIN G POTASSIUM (PENICILLIN G POTASSIUM) * SULFA (SULFONAMIDES) GROUP  Past Surgical History:    NSVD x 2    Carpal Tunnel w/ Bilateral Releases    R inguinal Hernia Repair (Dr Maple Hudson)    L Breast Cystectomy R Lumpectomy B9     DEXA- Osteopenia Recheck 2 years 10/01    Uteroscopy without laser secondary stone 11/01    DEXA- Osteopenia has progressed  ~ 15% 09/10/2002    DEXA- Osteoipenia mildly Progressed   12/06/2007    Risk Factors:     Has patient --       Felt need to cut down:  no       Been annoyed by complaints:  no       Felt guilty about drinking:  no       Needed eye opener in the morning:  no    Counseled to quit/cut down alcohol use:  no   Review of Systems  General      Complains of sweats.      Denies chills, fatigue, fever, loss of appetite, malaise, sleep disorder, weakness, and weight loss.      hot flashes  Eyes      Denies blurring, discharge, double vision, eye irritation, eye pain, halos, itching, light sensitivity, red eye, vision loss-1 eye, and vision loss-both eyes.  ENT      Complains of ringing in ears.      Denies decreased hearing, difficulty swallowing, ear discharge, earache, hoarseness, nasal congestion, nosebleeds, postnasal drainage, sinus pressure, and sore throat.      chronically  CV       Denies bluish discoloration of lips or nails, chest pain or discomfort, difficulty breathing at night, difficulty breathing while lying down, fainting, fatigue, leg cramps with exertion, lightheadness, near fainting, palpitations, shortness of breath with exertion, swelling of feet, swelling of hands, and weight gain.  Resp      Denies chest discomfort, chest pain with inspiration, cough, coughing up blood, excessive snoring, hypersomnolence, morning headaches, pleuritic, shortness of breath, sputum productive, and wheezing.  GI      Denies abdominal pain, bloody stools, change in bowel habits, constipation, dark tarry stools, diarrhea, excessive appetite, gas, hemorrhoids, indigestion, loss of appetite, nausea, vomiting, vomiting blood, and yellowish skin color.  GU      Complains of nocturia.      Denies abnormal vaginal bleeding, decreased libido, discharge, dysuria, genital sores, hematuria, incontinence, urinary frequency, and urinary hesitancy.      one-two  MS      Denies joint pain, joint redness, joint swelling, loss of strength, low back pain, mid back pain, muscle aches, muscle , cramps, muscle weakness, stiffness, and thoracic pain.  Derm  Complains of dryness.      Denies changes in color of skin, changes in nail beds, excessive perspiration, flushing, hair loss, insect bite(s), itching, lesion(s), poor wound healing, and rash.  Neuro      Denies brief paralysis, difficulty with concentration, disturbances in coordination, falling down, headaches, inability to speak, memory loss, numbness, poor balance, seizures, sensation of room spinning, tingling, tremors, visual disturbances, and weakness.   Physical Exam  General:     Well-developed,well-nourished,in no acute distress; alert,appropriate and cooperative throughout examination Head:     Normocephalic and atraumatic without obvious abnormalities. No apparent alopecia or balding. Eyes:      Conjunctiva clear bilaterally.  Ears:     External ear exam shows no significant lesions or deformities.  Otoscopic examination reveals clear canals, tympanic membranes are intact bilaterally without bulging, retraction, inflammation or discharge. Hearing is grossly normal bilaterally. Nose:     External nasal examination shows no deformity or inflammation. Nasal mucosa are pink and moist without lesions or exudates. Mouth:     Oral mucosa and oropharynx without lesions or exudates.  Teeth in good repair. Neck:     No deformities, masses, or tenderness noted. Chest Wall:     No deformities, masses, or tenderness noted. Breasts:     No mass, nodules, thickening, tenderness, bulging, retraction, inflamation, nipple discharge or skin changes noted.   Lungs:     Normal respiratory effort, chest expands symmetrically. Lungs are clear to auscultation, no crackles or wheezes. Heart:     Normal rate and regular rhythm. S1 and S2 normal without gallop, murmur, click, rub or other extra sounds. Abdomen:     Bowel sounds positive,abdomen soft and non-tender without masses, organomegaly or hernias noted. Rectal:     No external abnormalities noted. Normal sphincter tone. No rectal masses or tenderness. G neg. Genitalia:     Normal introitus for age, no external lesions, no vaginal discharge, mucosa pink and moist, no vaginal or cervical lesions, no vaginal atrophy, no friaility or hemorrhage, normal uterus size and position, no adnexal masses or tenderness Msk:     No deformity or scoliosis noted of thoracic or lumbar spine.   Pulses:     R and L carotid,radial,femoral,dorsalis pedis and posterior tibial pulses are full and equal bilaterally Extremities:     No clubbing, cyanosis, edema, or deformity noted with normal full range of motion of all joints.   Neurologic:      No cranial nerve deficits noted. Station and gait are normal. Plantar reflexes are down-going bilaterally. DTRs are symmetrical throughout. Sensory, motor and coordinative functions appear intact. Skin:     Intact without suspicious lesions or rashes Cervical Nodes:     No lymphadenopathy noted Axillary Nodes:     No palpable lymphadenopathy Inguinal Nodes:     No significant adenopathy Psych:     Cognition and judgment appear intact. Alert and cooperative with normal attention span and concentration. No apparent delusions, illusions, hallucinations  Diabetes Management Exam:    Eye Exam:       Eye Exam done elsewhere          Date: 10/10/2008          Results: normal          Done by: Zollie Beckers.    Impression & Recommendations:  Problem # 1:  HEALTH MAINTENANCE EXAM (ICD-V70.0) Assessment: Comment Only  Problem # 2:  OSTEOPENIA (ICD-733.90) Is now on Vit D repl which will hopefully help. Orders: Radiology  Referral (Radiology)Will cancel DEXA request as test done last year.   Problem # 3:  HYPERTENSION (ON ACE SECONDARY DM PRIOR) (ICD-401.9) Assessment: Unchanged Will follow. If high next time, adjust medication. Her updated medication list for this problem includes:    Quinapril Hcl 20 Mg Tabs (Quinapril hcl) .Marland Kitchen... Take one by mouth twice daily    Hydrochlorothiazide 25 Mg Tabs (Hydrochlorothiazide) .Marland Kitchen... Take one by mouth daily  BP today: 140/80 Prior BP: 124/74 (07/11/2008)  Labs Reviewed: Creat: 0.5 (11/27/2008) Chol: 139 (11/27/2008)   HDL: 35.5 (11/27/2008)   LDL: 88 (11/27/2008)   TG: 79 (11/27/2008)   Problem # 4:  GILBERT'S SYNDROME (ICD-277.4) Assessment: Unchanged Stable, Bili still elevated mildly.  Problem # 5:  HYPERCALCEMIA ON HCTZ WITH GOOD RESULTS (ICD-275.42) Assessment: Improved Calcium nml.  Problem # 6:  HYPERCHOLESTEROLEMIA ( 241 TRIG 225) (ICD-272.0) Assessment: Unchanged Adequate control.  Her updated medication list for this problem includes:    Lipitor 10 Mg Tabs (Atorvastatin calcium) .Marland Kitchen... Take one by mouth at bedtime  Labs Reviewed: Chol: 139 (11/27/2008)   HDL: 35.5 (11/27/2008)   LDL: 88 (11/27/2008)   TG: 79 (11/27/2008) SGOT: 20 (11/27/2008)   SGPT: 24 (11/27/2008)   Problem # 7:  PERIMENOPAUSAL STATUS (ICD-627.2) Assessment: Unchanged Continues with Hot Flashes. Stable and do not suggest hormones.  Complete Medication List: 1)  Nasonex 50 Mcg/act Susp (Mometasone furoate) .Marland Kitchen.. 1 spray each nostril as needed 2)  Avandia 8 Mg Tabs (Rosiglitazone maleate) .Marland Kitchen.. 1 tablet daily by mouth 3)  Quinapril Hcl 20 Mg Tabs (Quinapril hcl) .... Take one by mouth twice daily 4)  Lipitor 10 Mg Tabs (Atorvastatin calcium) .... Take one by mouth at bedtime 5)  Hydrochlorothiazide 25 Mg Tabs (Hydrochlorothiazide) .... Take one by mouth daily 6)  Metformin Hcl 1000 Mg Tabs (Metformin hcl) .... Take one by mouth twice a day. 7)  Levemir Flexpen 100 Unit/ml Soln (Insulin detemir) .... Inject 64 units subq daily dx 250.00 8)  Freestyle Test Strp (Glucose blood) .... Check blood sugar as directed qid as needed 9)  Bd Insulin Syringe Ultrafine 31g X 5/16" 0.3 Ml Misc (Insulin syringe-needle u-100) .... Use as directed 10)  Fish Oil Concentrate 1000 Mg Caps (Omega-3 fatty acids) .Marland Kitchen.. 1 by mouth twice daily   Patient Instructions: 1)  RTC 6 mos, A1C prior 250.00  Appended Document: cpx/dlo Levemir and Anvandia well tolerated thusfar with good sugar control. She knows to come in if starts to have edema.  Appended Document: cpx/dlo    Clinical Lists Changes       Appended Document: Orders Update    Clinical Lists Changes  Orders: Added new Service order of Est. Patient 40-64 years ([address]) - Signed

## 2010-12-10 NOTE — Letter (Signed)
Summary: Nadara Eaton letter  [facility]  [address] [place], [place] [address]   Phone: [phone]  Fax: [phone]       06/15/2010 MRN: [id number]  [facility] [address] [place], [place]  [address]  Dear Ms. [facility], and [place] announce the retirement of Arta Silence, M.D., from full-time practice at the [facility] office effective May 07, 2010 and his plans of returning part-time.  It is important to Dr. Hetty Ely and to our practice that you understand that [facility] has seven physicians in our office for your health care needs.  We will continue to offer the same exceptional care that you have today.    Dr. Hetty Ely has spoken to many of you about his plans for retirement and returning part-time in the fall.   We will continue to work with you through the transition to schedule appointments for you in the office and meet the high standards that [facility] is committed to.   Again, it is with great pleasure that we share the news that Dr. Hetty Ely will return to [facility] at [facility] in October of 2011 with a reduced schedule.    If you have any questions, or would like to request an appointment with one of our physicians, please call us at [phone] and press the option for Scheduling an appointment.  We take pleasure in providing you with excellent patient care and look forward to seeing you at your next office visit.  Our [facility] Physicians are:  Tillman Abide, M.D. Laurita Quint, M.D. Roxy Manns, M.D. Kerby Nora, M.D. Hannah Beat, M.D. Ruthe Mannan, M.D. We proudly welcomed Raechel Ache, M.D. and Eustaquio Boyden, M.D. to the practice in July/August 2011.  Sincerely,  [place]

## 2010-12-10 NOTE — Assessment & Plan Note (Signed)
Summary: CPX /DLO   Vital Signs:  Patient Profile:   65 Years Old Female Height:     66 inches Weight:      160 pounds Temp:     99 degrees F oral Pulse rate:   80 / minute Pulse rhythm:   regular BP sitting:   140 / 80  (left arm) Cuff size:   regular  Vitals Entered ByMarland Kitchen Providence Crosby (September 20, 2007 2:09 PM)               Vision Comments: 12/2007   Chief Complaint:  check up// hemocult cards to patient.  History of Present Illness: BP slightly high, no missed doses of meds. Last visit 122/70. No comploaints except shoulder hurt by painting. Mustard worked for cramping.  Current Allergies (reviewed today): PENICILLIN G POTASSIUM (PENICILLIN G POTASSIUM) * SULFA (SULFONAMIDES) GROUP    Risk Factors:  Passive smoke exposure:  no Drug use:  no HIV high-risk behavior:  no Caffeine use:  3 drinks per day Alcohol use:  yes    Type:  wine rarely    Drinks per day:  0    Has patient --       Felt need to cut down:  no       Been annoyed by complaints:  no       Felt guilty about drinking:  no       Needed eye opener in the morning:  no    Counseled to quit/cut down alcohol use:  no Exercise:  yes    Times per week:  7    Type:  walks daily Seatbelt use:  100 %   Review of Systems  General      Denies chills, fatigue, fever, loss of appetite, malaise, sleep disorder, sweats, weakness, and weight loss.      hot flashes  Eyes      Denies blurring, discharge, double vision, eye irritation, eye pain, halos, itching, light sensitivity, red eye, vision loss-1 eye, and vision loss-both eyes.  ENT      Denies decreased hearing, difficulty swallowing, ear discharge, earache, hoarseness, nasal congestion, nosebleeds, postnasal drainage, ringing in ears, sinus pressure, and sore throat.  CV       Denies bluish discoloration of lips or nails, chest pain or discomfort, difficulty breathing at night, difficulty breathing while lying down, fainting, fatigue, leg cramps with exertion, lightheadness, near fainting, palpitations, shortness of breath with exertion, swelling of feet, swelling of hands, and weight gain.  Resp      Denies chest discomfort, chest pain with inspiration, cough, coughing up blood, excessive snoring, hypersomnolence, morning headaches, pleuritic, shortness of breath, sputum productive, and wheezing.  GI      Denies abdominal pain, bloody stools, change in bowel habits, constipation, dark tarry stools, diarrhea, excessive appetite, gas, hemorrhoids, indigestion, loss of appetite, nausea, vomiting, vomiting blood, and yellowish skin color.  GU      Denies abnormal vaginal bleeding, decreased libido, discharge, dysuria, genital sores, hematuria, incontinence, nocturia, urinary frequency, and urinary hesitancy.      bladder has dropped slightly with once nightly trips.  MS      Denies joint pain, joint redness, joint swelling, loss of strength, low back pain, mid back pain, muscle aches, muscle , cramps, muscle weakness, stiffness, and thoracic pain.  Derm      Denies changes in color of skin, changes in nail beds, dryness, excessive perspiration, flushing, hair loss, insect bite(s), itching, lesion(s), poor wound healing, and  rash.  Neuro      Denies brief paralysis, difficulty with concentration, disturbances in coordination, falling down, headaches, inability to speak, memory loss, numbness, poor balance, seizures, sensation of room spinning, tingling, tremors, visual disturbances, and weakness.      finger tingling ar times..Lab result: Hemoccults/o carpal tunnel. 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   Physical Exam  General:     Well-developed,well-nourished,in no acute distress; alert,appropriate and cooperative throughout examination Head:      Normocephalic and atraumatic without obvious abnormalities. No apparent alopecia or balding. Eyes:     Conjunctiva clear bilaterally.  Ears:     External ear exam shows no significant lesions or deformities.  Otoscopic examination reveals clear canals, tympanic membranes are intact bilaterally without bulging, retraction, inflammation or discharge. Hearing is grossly normal bilaterally. Nose:     External nasal examination shows no deformity or inflammation. Nasal mucosa are pink and moist without lesions or exudates. Mouth:     Oral mucosa and oropharynx without lesions or exudates.  Teeth in good repair. Neck:     No deformities, masses, or tenderness noted. Chest Wall:     No deformities, masses, or tenderness noted. Breasts:     No mass, nodules, thickening, tenderness, bulging, retraction, inflamation, nipple discharge or skin changes noted.   Lungs:     Normal respiratory effort, chest expands symmetrically. Lungs are clear to auscultation, no crackles or wheezes. Heart:     Normal rate and regular rhythm. S1 and S2 normal without gallop, murmur, click, rub or other extra sounds. Abdomen:     Bowel sounds positive,abdomen soft and non-tender without masses, organomegaly or hernias noted. Rectal:     No external abnormalities noted. Normal sphincter tone. No rectal masses or tenderness. G neg. Genitalia:     Pelvic Exam:        External: normal female genitalia without lesions or masses        Vagina: normal without lesions or masses        Cervix: normal without lesions or masses        Adnexa: normal bimanual exam without masses or fullness.   Pap sent        Uterus: normal by palpation        Pap smear: performedno vaginal atrophy.   Msk:     No deformity or scoliosis noted of thoracic or lumbar spine.   Pulses:     R and L carotid,radial,femoral,dorsalis pedis and posterior tibial pulses are full and equal bilaterally Extremities:      No clubbing, cyanosis, edema, or deformity noted with normal full range of motion of all joints.   Neurologic:     No cranial nerve deficits noted. Station and gait are normal. Plantar reflexes are down-going bilaterally. DTRs are symmetrical throughout. Sensory, motor and coordinative functions appear intact. Skin:     Intact without suspicious lesions or rashes Cervical Nodes:     No lymphadenopathy noted Axillary Nodes:     No palpable lymphadenopathy Inguinal Nodes:     No significant adenopathy Psych:     Cognition and judgment appear intact. Alert and cooperative with normal attention span and concentration. No apparent delusions, illusions, hallucinations  Diabetes Management Exam:    Foot Exam (with socks and/or shoes not present):       Sensory-Monofilament:          Left foot: normal          Right foot: normal       Inspection:  Left foot: normal          Right foot: normal       Nails:          Left foot: normal          Right foot: normal    Eye Exam:       Eye Exam done elsewhere          Date: 12/20/2006          Results: normal    Impression & Recommendations:  Problem # 1:  HEALTH MAINTENANCE EXAM (ICD-V70.0) Assessment: Comment Only  Problem # 2:  HYPERTENSION (ON ACE SECONDARY DM PRIOR) (ICD-401.9) Assessment: Deteriorated Recheck next visit. Her updated medication list for this problem includes:    Quinapril Hcl 20 Mg Tabs (Quinapril hcl) .Marland Kitchen... Take one by mouth daily    Hydrochlorothiazide 25 Mg Tabs (Hydrochlorothiazide) .Marland Kitchen... Take one by mouth daily  BP today: 140/80 Prior BP: 122/70 (05/29/2007)  Labs Reviewed: Creat: 0.4 (09/18/2007) Chol: 143 (09/18/2007)   HDL: 34.7 (09/18/2007)   LDL: 93 (09/18/2007)   TG: 78 (09/18/2007)   Problem # 3:  DIABETES MELLITUS, TYPE II (ICD-250.00) Assessment: Unchanged Increase Levemir to 63, decrease night Metformin 1/2 tab. Her updated medication list for this problem includes:     Avandia 8 Mg Tabs (Rosiglitazone maleate)    Quinapril Hcl 20 Mg Tabs (Quinapril hcl) .Marland Kitchen... Take one by mouth daily    Metformin Hcl 1000 Mg Tabs (Metformin hcl) .Marland Kitchen... Take one by mouth at bedtime    Levemir Flexpen 100 Unit/ml Soln (Insulin detemir) ..... Inject 63 units subq dailyup to 58 units per day  Labs Reviewed: HgBA1c: 7.3 (09/18/2007)   Creat: 0.4 (09/18/2007)   Microalbumin: 29.5 (03/31/2006)   Problem # 4:  GILBERT'S SYNDROME (ICD-277.4) Assessment: Unchanged Stable.  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  Problem # 6:  HYPERCHOLESTEROLEMIA ( 241 TRIG 225) (ICD-272.0) Assessment: Unchanged Adequate. Her updated medication list for this problem includes:    Lipitor 10 Mg Tabs (Atorvastatin calcium) .Marland Kitchen... Take one by mouth at bedtime  Labs Reviewed: Chol: 143 (09/18/2007)   HDL: 34.7 (09/18/2007)   LDL: 93 (09/18/2007)   TG: 78 (09/18/2007) SGOT: 17 (09/18/2007)   SGPT: 20 (09/18/2007)   Complete Medication List: 1)  Nasonex 50 Mcg/act Susp (Mometasone furoate) 2)  Avandia 8 Mg Tabs (Rosiglitazone maleate) 3)  Quinapril Hcl 20 Mg Tabs (Quinapril hcl) .... Take one by mouth daily 4)  Lipitor 10 Mg Tabs (Atorvastatin calcium) .... Take one by mouth at bedtime 5)  Hydrochlorothiazide 25 Mg Tabs (Hydrochlorothiazide) .... Take one by mouth daily 6)  Metformin Hcl 1000 Mg Tabs (Metformin hcl) .... Take one by mouth at bedtime 7)  Levemir Flexpen 100 Unit/ml Soln (Insulin detemir) .... Inject 63 units subq dailyup to 58 units per day 8)  Fish Oil Capsules  .... Take one by mouth two times a day 9)  Allegra 180 Mg Tabs (Fexofenadine hcl) .... Take one by mouth daily as needed 10)  Metformin 500mg   .... 1 q am    Patient Instructions: 1)  RTC 3 mos with nos. 2)  Recheck BP. 3)  Increase Levemir to 63, decrease night Metformin to 1/2    Prescriptions:  LEVEMIR FLEXPEN 100 UNIT/ML  SOLN (INSULIN DETEMIR) Inject 63 units SubQ dailyUP TO 58 UNITS PER DAY  #3 pens x 12   Entered and Authorized by:   Shaune Leeks MD  Signed by:   Shaune Leeks MD on 09/20/2007   Method used:   Print then Give to Patient   RxID:   [id number]  ]

## 2010-12-10 NOTE — Progress Notes (Signed)
Summary: insulin rx (lmom)  Phone Note Call from Patient Call back at Home Phone [phone]   Caller: Patient Call For: schaller Summary of Call: there is a problem with her insulin rx,  she is using levemir pen  60 units qd, instead of the 10 units she started with. needs new rx Initial call taken by: Liane Comber,  July 20, 2007 9:30 AM  Follow-up for Phone Call        Fine, pls update script....done. Follow-up by: Shaune Leeks MD,  July 20, 2007 10:10 AM  Additional Follow-up for Phone Call Additional follow up Details #1::        LEFT MESSAGE ON MACHINE ..................................................................Marland KitchenMarcelle Smiling Iva Montelongo  July 20, 2007 11:42 AM     New/Updated Medications: LEVEMIR FLEXPEN 100 UNIT/ML  SOLN (INSULIN DETEMIR) Inject 60 units SubQ dailyUP TO 58 UNITS PER DAY   Prescriptions: LEVEMIR FLEXPEN 100 UNIT/ML  SOLN (INSULIN DETEMIR) Inject 60 units SubQ dailyUP TO 58 UNITS PER DAY  #3 pens x prn   Entered by:   Shaune Leeks MD   Authorized by:   Liane Comber   Signed by:   Liane Comber on 07/20/2007   Method used:   Print then Give to Patient   RxID:   [id number]     Prior Medications: NASONEX 50 MCG/ACT SUSP (MOMETASONE FUROATE)  METFORMIN HCL 500 MG TB24 (METFORMIN HCL) Take one by mouth every morning AVANDIA 8 MG TABS (ROSIGLITAZONE MALEATE)  QUINAPRIL HCL 20 MG TABS (QUINAPRIL HCL) Take one by mouth daily LIPITOR 10 MG TABS (ATORVASTATIN CALCIUM) Take one by mouth at bedtime HYDROCHLOROTHIAZIDE 25 MG TABS (HYDROCHLOROTHIAZIDE) Take one by mouth daily METFORMIN HCL 1000 MG  TABS (METFORMIN HCL) Take one by mouth at bedtime FISH OIL CAPSULES () Take one by mouth two times a day ALLEGRA 180 MG  TABS (FEXOFENADINE HCL) Take one by mouth daily as needed Current Allergies: PENICILLIN G POTASSIUM (PENICILLIN G POTASSIUM) * SULFA (SULFONAMIDES) GROUP

## 2010-12-10 NOTE — Assessment & Plan Note (Signed)
Summary: ? UTI   Vital Signs:  Patient profile:   65 year old female Weight:      159.50 pounds BMI:     26.64 Temp:     98.9 degrees F oral Pulse rate:   92 / minute Pulse rhythm:   regular BP sitting:   130 / 80  (left arm) Cuff size:   regular  Vitals Entered By: Sydell Axon LPN (April 20, 2010 4:17 PM) CC: ? UTI, urine is dark, urine frequency and back ache   History of Present Illness: Pt here for rulling out UtI due to dark urine, brownish and unusual odor. She has a slight back ache. She has no burniong, no suprapubic pain. She has had no fever or chills.  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 1 Spray Each Nostril Every Day or As Needed 2)  Quinapril Hcl 20 Mg Tabs (Quinapril Hcl) .... Take One By Mouth Twice Daily 3)  Lipitor 10 Mg Tabs (Atorvastatin Calcium) .... Take One By Mouth At Bedtime 4)  Hydrochlorothiazide 25 Mg Tabs (Hydrochlorothiazide) .... Take One By Mouth Daily 5)  Metformin Hcl 1000 Mg  Tabs (Metformin Hcl) .... Take One By Mouth Twice A Day. 6)  Levemir Flexpen 100 Unit/ml  Soln (Insulin Detemir) .... Inject 76 Units Subq Daily Dx 250.00 7)  Freestyle Test   Strp (Glucose Blood) .... Check Blood Sugar As Directed Qid As Needed 8)  Fish Oil Concentrate 1000 Mg   Caps (Omega-3 Fatty Acids) .Marland Kitchen.. 1 By Mouth Twice Daily 9)  Vitamin D 1000 Unit Tabs (Cholecalciferol) .Marland Kitchen.. 1 Daily By Mouth 10)  Bd Pen Needle Short U/f 31g X 8 Mm Misc (Insulin Pen Needle) .... Use As Directed 11)  Aspirin 81 Mg Tbec (Aspirin) .... Take One By Mouth Daily  Allergies: 1)  Penicillin G Potassium (Penicillin G Potassium) 2)  * Sulfa (Sulfonamides) Group  Physical Exam  General:  Well-developed,well-nourished,in no acute distress; alert,appropriate and cooperative throughout examination, nontoxic. Head:  Normocephalic and atraumatic without obvious abnormalities. No apparent alopecia or balding. Eyes:  Conjunctiva clear bilaterally.  Ears:  External ear exam shows no significant lesions or deformities.  Otoscopic examination reveals clear canals, tympanic membranes are intact bilaterally without bulging, retraction, inflammation or discharge. Hearing is grossly normal bilaterally. Nose:  External nasal examination shows no deformity or inflammation. Nasal mucosa are pink and moist without lesions or exudates. Mouth:  Oral mucosa and oropharynx without lesions or exudates.  Teeth in good repair. Lungs:  Normal respiratory effort, chest expands symmetrically. Lungs are clear to auscultation, no crackles or wheezes. Heart:  Normal rate and regular rhythm. S1 and S2 normal without gallop, murmur, click, rub or other extra sounds. Abdomen:  No suprapubic tenderness. Msk:  No CVAT   Impression & Recommendations:  Problem # 1:  UTI (  ICD-599.0) Assessment New  Start Cipro. Will culture. Declines Pyridium. Her updated medication list for this problem includes:    Cipro 500 Mg Tabs (Ciprofloxacin hcl) ..... One tab by mouth two times a day for ten days.  Encouraged to push clear liquids, get enough rest, and take acetaminophen as needed. To be seen in 10 days if no improvement, sooner if worse.  Orders: Specimen Handling ([id number]) T-Culture, Urine ([id number])  Complete  Medication List: 1)  Nasonex 50 Mcg/act Susp (Mometasone furoate) .Marland Kitchen.. 1 spray each nostril every day or as needed 2)  Quinapril Hcl 20 Mg Tabs (Quinapril hcl) .... Take one by mouth twice daily 3)  Lipitor 10 Mg Tabs (Atorvastatin calcium) .... Take one by mouth at bedtime 4)  Hydrochlorothiazide 25 Mg Tabs (Hydrochlorothiazide) .... Take one by mouth daily 5)  Metformin Hcl 1000 Mg Tabs (Metformin hcl) .... Take one by mouth twice a day. 6)  Levemir Flexpen 100 Unit/ml Soln (Insulin detemir) .... Inject 76 units subq daily dx 250.00 7)  Freestyle Test Strp (Glucose blood) .... Check blood sugar as directed qid as needed 8)  Fish Oil Concentrate 1000 Mg Caps (Omega-3 fatty acids) .Marland Kitchen.. 1 by mouth twice daily 9)  Vitamin D 1000 Unit Tabs (Cholecalciferol) .Marland Kitchen.. 1 daily by mouth 10)  Bd Pen Needle Short U/f 31g X 8 Mm Misc (Insulin pen needle) .... Use as directed 11)  Aspirin 81 Mg Tbec (Aspirin) .... Take one by mouth daily 12)  Cipro 500 Mg Tabs (Ciprofloxacin hcl) .... One tab by mouth two times a day for ten days.  Other Orders: UA Dipstick W/ Micro (manual) ([id number])   Patient Instructions: 1)  Call if sxs don't improve. Prescriptions: CIPRO 500 MG TABS (CIPROFLOXACIN HCL) one tab by mouth two times a day for ten days.  #20 x 0   Entered and Authorized by:   Shaune Leeks MD   Signed by:   Shaune Leeks MD on 04/20/2010   Method used:   [facility]        [id number] ([facility]ail[facility]       [address]       [place], [place]  [address]       Ph: [phone] or [phone]       Fax: [phone]   RxID:   [id number]   Current Allergies (reviewed today): PENICILLIN G POTASSIUM (PENICILLIN G POTASSIUM) * SULFA (SULFONAMIDES) GROUP  Laboratory Results   Urine Tests  Date/Time Received: April 20, 2010 4:31 PM  Date/Time Reported: April 20, 2010 4:31 PM   Routine Urinalysis   Color: yellow Appearance: Cloudy Glucose: negative    (Normal Range: Negative) Bilirubin: negative   (Normal Range: Negative) Ketone: negative   (Normal Range: Negative) Spec. Gravity: >=1.030   (Normal Range: 1.003-1.035) Blood: large   (Normal Range: Negative) pH: 6.0   (Normal Range: 5.0-8.0) Protein: trace   (Normal Range: Negative) Urobilinogen: 0.2   (Normal Range: 0-1) Nitrite: negative   (Normal Range: Negative) Leukocyte Esterace: trace   (Normal Range: Negative)

## 2010-12-10 NOTE — Miscellaneous (Signed)
  Clinical Lists Changes  Medications: Changed medication from LEVEMIR FLEXPEN 100 UNIT/ML  SOLN (INSULIN DETEMIR) Inject 63 units SubQ dailyUP TO 58 UNITS PER DAY to LEVEMIR FLEXPEN 100 UNIT/ML  SOLN (INSULIN DETEMIR) Inject 63 units SubQ daily      Prior Medications: NASONEX 50 MCG/ACT SUSP (MOMETASONE FUROATE)  AVANDIA 8 MG TABS (ROSIGLITAZONE MALEATE)  QUINAPRIL HCL 20 MG TABS (QUINAPRIL HCL) Take one by mouth daily LIPITOR 10 MG TABS (ATORVASTATIN CALCIUM) Take one by mouth at bedtime HYDROCHLOROTHIAZIDE 25 MG TABS (HYDROCHLOROTHIAZIDE) Take one by mouth daily METFORMIN HCL 1000 MG  TABS (METFORMIN HCL) Take one by mouth at bedtime LEVEMIR FLEXPEN 100 UNIT/ML  SOLN (INSULIN DETEMIR) Inject 63 units SubQ daily FISH OIL CAPSULES () Take one by mouth two times a day ALLEGRA 180 MG  TABS (FEXOFENADINE HCL) Take one by mouth daily as needed METFORMIN 500MG  () 1 q am Current Allergies: PENICILLIN G POTASSIUM (PENICILLIN G POTASSIUM) * SULFA (SULFONAMIDES) GROUP

## 2010-12-10 NOTE — Progress Notes (Signed)
Summary: Triage Call- UTI  Phone Note Call from Patient   Caller: Patient Call For: Shaune Leeks MD Summary of Call: Triage call: Blood in urine, very little burning, no pain, urine is dark.  Nothing available on scheduled..Your recommendation. Call back # [phone] or [phone]- [facility] [place].Marland KitchenMarland KitchenMarland KitchenDaine Gip  May 25, 2010 2:45 PM  Initial call taken by: Daine Gip,  May 25, 2010 2:45 PM  Follow-up for Phone Call        Appointment scheduled  05/26/2010 at 3:30 p.m. Follow-up by: Delilah Shan CMA Rondey Fallen Dull),  May 25, 2010 3:41 PM

## 2010-12-10 NOTE — Progress Notes (Signed)
Summary: ? UTI  Phone Note Call from Patient Call back at Home Phone [phone]   Caller: Patient Call For: Shaune Leeks MD Summary of Call: Pt states she has  a UTI- did a test at home and it was positive for leuks.  Her only symptom is some back ache.    There are no appts available today and she is leaving to go out of town tomorrow.  She is asking if she can come in today and leave a urine sample. Initial call taken by: Lowella Petties CMA,  April 20, 2010 8:44 AM  Follow-up for Phone Call        I will see her at [phone]. Follow-up by: Shaune Leeks MD,  April 20, 2010 8:57 AM  Additional Follow-up for Phone Call Additional follow up Details #1::        Appointment made, pt advised. Additional Follow-up by: Lowella Petties CMA,  April 20, 2010 9:01 AM

## 2010-12-10 NOTE — Progress Notes (Signed)
Summary: Levemir  Phone Note Call from Patient Call back at [phone]   Caller: Patient Call For: Dr. Hetty Ely Summary of Call: Pt is on Levemir flex pen 100 unit/mnov, old rx was for 63 units and this was changed about 3 weeks ago to 64 units.  Pt is running low on her pen and needs a new rx with new dose sent to pharmacy  because her insurance will not left her get the rx that she has now refilled until 05/28 and she is going to run out of her medication long before that date.  [facility] Initial call taken by: Sydell Axon,  Mar 29, 2008 9:12 AM  Follow-up for Phone Call        Notify patient that the below prescription has been sent to the pharmacy electronically.  Follow-up by: Kerby Nora MD,  Mar 29, 2008 1:58 PM  Additional Follow-up for Phone Call Additional follow up Details #1::        Pt notified as instructed. Additional Follow-up by: Sydell Axon,  Mar 29, 2008 2:35 PM    New/Updated Medications: LEVEMIR FLEXPEN 100 UNIT/ML  SOLN (INSULIN DETEMIR) Inject 64 units SubQ daily Dx 250.00   Prescriptions: LEVEMIR FLEXPEN 100 UNIT/ML  SOLN (INSULIN DETEMIR) Inject 64 units SubQ daily Dx 250.00  #1 box x 3   Entered and Authorized by:   Kerby Nora MD   Signed by:   Kerby Nora MD on 03/29/2008   Method used:   [facility] ...       [id number]       [address] [place], [place]  [address]       Ph: [phone] or [phone]       Fax: [phone]   RxID:   [id number]

## 2010-12-10 NOTE — Miscellaneous (Signed)
Summary: med list update- levemir  Clinical Lists Changes  Medications: Changed medication from LEVEMIR FLEXPEN 100 UNIT/ML  SOLN (INSULIN DETEMIR) Inject 80 units SubQ daily Dx 250.00 to LEVEMIR FLEXPEN 100 UNIT/ML  SOLN (INSULIN DETEMIR) Inject 85 units SubQ daily Dx 250.00     Prior Medications: NASONEX 50 MCG/ACT SUSP (MOMETASONE FUROATE) 1 spray each nostril every day or as needed QUINAPRIL HCL 20 MG TABS (QUINAPRIL HCL) Take one by mouth twice daily LIPITOR 10 MG TABS (ATORVASTATIN CALCIUM) Take one by mouth at bedtime HYDROCHLOROTHIAZIDE 25 MG TABS (HYDROCHLOROTHIAZIDE) Take one by mouth daily METFORMIN HCL 1000 MG  TABS (METFORMIN HCL) Take one by mouth twice a day. LEVEMIR FLEXPEN 100 UNIT/ML  SOLN (INSULIN DETEMIR) Inject 85 units SubQ daily Dx 250.00 FREESTYLE TEST   STRP (GLUCOSE BLOOD) check blood sugar as directed four times a day as needed FISH OIL CONCENTRATE 1000 MG  CAPS (OMEGA-3 FATTY ACIDS) 1 by mouth twice daily VITAMIN D 1000 UNIT TABS (CHOLECALCIFEROL) 1 daily by mouth BD PEN NEEDLE SHORT U/F 31G X 8 MM MISC (INSULIN PEN NEEDLE) use as directed ASPIRIN 81 MG TBEC (ASPIRIN) Take one by mouth daily CIPRO 500 MG TABS (CIPROFLOXACIN HCL) one tab by mouth two times a day for ten days. Current Allergies: PENICILLIN G POTASSIUM (PENICILLIN G POTASSIUM) * SULFA (SULFONAMIDES) GROUP

## 2010-12-10 NOTE — Letter (Signed)
Summary: Pre Visit Letter Revised  [facility]  [address] [place], [place] [address]   Phone: [phone]  Fax: [phone]        08/27/2010 MRN: [id number]  [facility] [address] [place], [place]  [address]             Procedure Date:  12-5 8:30am   Welcome to the Gastroenterology Division at [facility].    You are scheduled to see a nurse for your pre-procedure visit on 09-28-10 at 9:30am on the 3rd floor at [facility], [address]  We ask that you try to arrive at our office 15 minutes prior to your appointment time to allow for check-in.  Please take a minute to review the attached form.  If you answer "Yes" to one or more of the questions on the first page, we ask that you call the person listed at your earliest opportunity.  If you answer "No" to all of the questions, please complete the rest of the form and bring it to your appointment.    Your nurse visit will consist of discussing your medical and surgical history, your immediate family medical history, and your medications.   If you are unable to list all of your medications on the form, please bring the medication bottles to your appointment and we will list them.  We will need to be aware of both prescribed and over the counter drugs.  We will need to know exact dosage information as well.    Please be prepared to read and sign documents such as consent forms, a financial agreement, and acknowledgement forms.  If necessary, and with your consent, a friend or relative is welcome to sit-in on the nurse visit with you.  Please bring your insurance card so that we may make a copy of it.  If your insurance requires a referral to see a specialist, please bring your referral form from your primary care physician.  No co-pay is required for this nurse visit.     If you cannot keep your appointment, please call [phone] to cancel or reschedule prior to your appointment date.  This allows  [place] the opportunity to schedule an appointment for another patient in need of care.    Thank you for choosing [facility] for your medical needs.  We appreciate the opportunity to care for you.  Please visit [place] at our website  to learn more about our practice.  Sincerely, The Gastroenterology Division

## 2010-12-10 NOTE — Letter (Signed)
Summary: Diagnostic Mammogram [facility]  Diagnostic Mammogram Order,[facility]   Imported By: Beau Fanny 12/24/2009 14:45:07  _____________________________________________________________________  External Attachment:    Type:   Image     Comment:   External Document

## 2010-12-10 NOTE — Letter (Signed)
Summary: Diabetic Instructions  [facility]  [address] [place], [place] [address]   Phone: [phone]  Fax: [phone]    Gloria [name] 02/06/1946 MRN: [id number]   (Metformin)  ORAL DIABETIC MEDICATION INSTRUCTIONS  The day before your procedure:   Take your diabetic pill as you do normally  The day of your procedure:   Do not take your diabetic pill    We will check your blood sugar levels during the admission process and again in Recovery before discharging you home  ________________________________________________________________________  Burr Medico)   INSULIN (LONG ACTING) MEDICATION INSTRUCTIONS (Lantus, NPH, 70/30, Humulin, Novolin-N)   The day before your procedure:   Take  your regular evening dose    The day of your procedure:   Do not take your morning dose

## 2010-12-10 NOTE — Miscellaneous (Signed)
Summary: [facility] PV  Clinical Lists Changes  Medications: Added new medication of MOVIPREP 100 GM  SOLR (PEG-KCL-NACL-NASULF-NA ASC-C) As per prep instructions. - Signed Rx of MOVIPREP 100 GM  SOLR (PEG-KCL-NACL-NASULF-NA ASC-C) As per prep instructions.;  #1 x 0;  Signed;  Entered by: Ezra Sites RN;  Authorized by: Rachael Fee MD;  Method used: [id number] [address], [place], [place]  [address], Ph: [phone] or [phone], Fax: [phone] Allergies: Changed allergy or adverse reaction from PENICILLIN G POTASSIUM (PENICILLIN G POTASSIUM) to PENICILLIN G POTASSIUM (PENICILLIN G POTASSIUM)    Prescriptions: MOVIPREP 100 GM  SOLR (PEG-KCL-NACL-NASULF-NA ASC-C) As per prep instructions.  #1 x 0   Entered by:   Ezra Sites RN   Authorized by:   Rachael Fee MD   Signed by:   Ezra Sites RN on 09/28/2010   Method used:   [facility]        [id number] ([facility]ail[facility]       [address]       [place], [place]  [address]       Ph: [phone] or [phone]       Fax: [phone]   RxID:   [id number]

## 2010-12-10 NOTE — Letter (Signed)
Summary: [facility]  [facility]   Imported By: Beau Fanny 09/11/2009 15:55:48  _____________________________________________________________________  External Attachment:    Type:   Image     Comment:   External Document

## 2010-12-10 NOTE — Letter (Signed)
Summary: Notice of Admission to [facility]  Notice of Admission to [facility]   Imported By: Beau Fanny 09/01/2009 13:38:18  _____________________________________________________________________  External Attachment:    Type:   Image     Comment:   External Document

## 2010-12-10 NOTE — Assessment & Plan Note (Signed)
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) 3)  Osteopenia  (ICD-733.90) 4)  Special Screening Malig Neoplasms Other Sites  (ICD-V76.49) 5)  Health Maintenance Exam  (ICD-V70.0) 6)  Hypertension (ON ACE SECONDARY DM PRIOR)  (ICD-401.9) 7)  Gilbert's Syndrome  (ICD-277.4) 8)  Carcinoma, 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 1 Spray Each Nostril Every Day or As Needed 2)  Quinapril Hcl 20 Mg Tabs (Quinapril Hcl) .... Take One By Mouth Twice Daily 3)  Lipitor 10 Mg Tabs (Atorvastatin Calcium) .... Take One By Mouth At Bedtime 4)  Hydrochlorothiazide 25 Mg Tabs (Hydrochlorothiazide) .... Take One By Mouth Daily 5)  Metformin Hcl 1000 Mg  Tabs (Metformin Hcl) .... Take One By Mouth Twice A Day. 6)  Levemir Flexpen 100 Unit/ml  Soln (Insulin Detemir) .... Inject 70 Units Subq Daily Dx 250.00 7)  Freestyle Test   Strp (Glucose Blood) .... Check Blood Sugar As Directed Qid As Needed 8)  Fish Oil Concentrate 1000 Mg  Caps (Omega-3 Fatty Acids) .Marland Kitchen.. 1 By Mouth Twice Daily 9)  Vitamin D 1000 Unit Tabs (Cholecalciferol) .Marland Kitchen.. 1 Daily By Mouth 10)  Bd Pen Needle Short U/f 31g X 8 Mm Misc (Insulin Pen Needle) .... Use As Directed 11)  Aspirin 81 Mg Tbec (Aspirin)  .... Take One By Mouth Daily  Allergies: 1)  Penicillin G Potassium (Penicillin G Potassium) 2)  * Sulfa (Sulfonamides) Group  Physical Exam  General:  Well-developed,well-nourished,in no acute distress; alert,appropriate and cooperative throughout examination Head:  Normocephalic and atraumatic without obvious abnormalities. No apparent alopecia or balding. Eyes:  Conjunctiva clear bilaterally.  Ears:  External ear exam shows no significant lesions or deformities.  Otoscopic examination reveals clear canals, tympanic membranes are intact bilaterally without bulging, retraction, inflammation or discharge. Hearing is grossly normal bilaterally. Nose:  External nasal examination shows no deformity or inflammation. Nasal mucosa are pink and moist without lesions or exudates. Mouth:  Oral mucosa and oropharynx without lesions or exudates.  Teeth in good repair. Neck:  No deformities, masses, or tenderness noted. Chest Wall:  No deformities, masses, or tenderness noted. Lungs:  Normal respiratory effort, chest expands symmetrically. Lungs are clear to auscultation, no crackles or wheezes. Heart:  Normal rate and regular rhythm. S1 and S2 normal without gallop, murmur, click, rub or other extra sounds.   Impression & Recommendations:  Problem # 1:  DIABETES MELLITUS, TYPE II (ICD-250.00) Assessment Improved Nos the last few days have been 90s and 100-110s. A1C still reflects past history. Is now up to 76 units and sounds like where she needs to be. Cont. Will recheck end of Jun. Her updated  medication list for this problem includes:    Quinapril Hcl 20 Mg Tabs (Quinapril hcl) .Marland Kitchen... Take one by mouth twice daily    Metformin Hcl 1000 Mg Tabs (Metformin hcl) .Marland Kitchen... Take one by mouth twice a day.    Levemir Flexpen 100 Unit/ml Soln (Insulin detemir) ..... Inject 76 units subq daily dx 250.00    Aspirin 81 Mg Tbec (Aspirin) .Marland Kitchen... Take one by mouth daily  Labs Reviewed: Creat: 0.4 (12/03/2009)    Microalbumin: 29.5 (03/31/2006)  Last Eye Exam: normal (12/20/2006) Reviewed HgBA1c results: 7.9 (01/28/2010)  7.8 (12/03/2009)  Problem # 2:  HYPERTENSION (ON ACE SECONDARY DM PRIOR) (ICD-401.9) Assessment: Unchanged Up slightly today but adequate for now. Want it lower but had lots of salt last week at [employer]. Had great time but everything was salty. Her updated medication list for this problem includes:    Quinapril Hcl 20 Mg Tabs (Quinapril hcl) .Marland Kitchen... Take one by mouth twice daily    Hydrochlorothiazide 25 Mg Tabs (Hydrochlorothiazide) .Marland Kitchen... Take one by mouth daily  BP today: 138/86 Prior BP: 128/70 (12/16/2009)  Labs Reviewed: K+: 4.0 (12/03/2009) Creat: : 0.4 (12/03/2009)   Chol: 126 (12/03/2009)   HDL: 36.80 (12/03/2009)   LDL: 77 (12/03/2009)   TG: 63.0 (12/03/2009)  Problem # 3:  DISRUPTION OF INTERNAL OPERATION SURGICAL WOUND (ICD-998.31) Assessment: Unchanged Still hasn't seen Dr Mia Creek. Is to see him next week. No further bleeding and no further problems. No complaint from her husband!  Complete Medication List: 1)  Nasonex 50 Mcg/act Susp (Mometasone furoate) .Marland Kitchen.. 1 spray each nostril every day or as needed 2)  Quinapril Hcl 20 Mg Tabs (Quinapril hcl) .... Take one by mouth twice daily 3)  Lipitor 10 Mg Tabs (Atorvastatin calcium) .... Take one by mouth at bedtime 4)  Hydrochlorothiazide 25 Mg Tabs (Hydrochlorothiazide) .... Take one by mouth daily 5)  Metformin Hcl 1000 Mg Tabs (Metformin hcl) .... Take one by mouth twice a day. 6)  Levemir Flexpen 100 Unit/ml Soln (Insulin detemir) .... Inject 76 units subq daily dx 250.00 7)  Freestyle Test Strp (Glucose blood) .... Check blood sugar as directed qid as needed 8)  Fish Oil Concentrate 1000 Mg Caps (Omega-3 fatty acids) .Marland Kitchen.. 1 by mouth twice daily 9)  Vitamin D 1000 Unit Tabs (Cholecalciferol) .Marland Kitchen.. 1 daily by mouth 10)  Bd Pen Needle Short U/f 31g X 8 Mm Misc (Insulin pen needle) .... Use as directed 11)   Aspirin 81 Mg Tbec (Aspirin) .... Take one by mouth daily  Patient Instructions: 1)  RTC late Jun for recheck, labs prior.  Current Allergies (reviewed today): PENICILLIN G POTASSIUM (PENICILLIN G POTASSIUM) * SULFA (SULFONAMIDES) GROUP

## 2010-12-10 NOTE — Assessment & Plan Note (Signed)
Summary: ?UTI/CLE   Vital Signs:  Patient Profile:   65 Years Old Female Height:     65 inches Weight:      167 pounds Temp:     98 degrees F oral Pulse rate:   88 / minute Pulse rhythm:   regular BP sitting:   140 / 80  (left arm) Cuff size:   regular  Vitals Entered By: Providence Crosby (December 30, 2008 11:52 AM)                 Chief Complaint:  URI X 3 WEEKS COUGH// UTI SYMPTOMS STARTED THIS WEEKEND.  History of Present Illness: Pt here for urinary frequency and mild burning with yellow urine and strange odor, sugar has not gone u[p really. Has had cough for three weeks with congestion, feels yucky and cough that has recurred.    Prior Medications Reviewed Using: Patient Recall  Current Allergies (reviewed today): PENICILLIN G POTASSIUM (PENICILLIN G POTASSIUM) * SULFA (SULFONAMIDES) GROUP      Physical Exam  General:     Well-developed,well-nourished,in no acute distress; alert,appropriate and cooperative throughout examination, congested. Head:     Normocephalic and atraumatic without obvious abnormalities. No apparent alopecia or balding. Sinuses minimally tender in Max distrib. Eyes:     Conjunctiva clear bilaterally.  Ears:     External ear exam shows no significant lesions or deformities.  Otoscopic examination reveals clear canals, tympanic membranes are intact bilaterally without bulging, retraction, inflammation or discharge. Hearing is grossly normal bilaterally. Nose:     External nasal examination shows no deformity or inflammation. Nasal mucosa are pink and moist without lesions or exudates. Mild mucous membr congestion. Mouth:     Oral mucosa and oropharynx without lesions or exudates.  Teeth in good repair. Neck:     No deformities, masses, or tenderness noted. Chest Wall:     No deformities, masses, or tenderness noted. Lungs:      Normal respiratory effort, chest expands symmetrically. Lungs are clear to auscultation, no crackles or wheezes. Heart:     Normal rate and regular rhythm. S1 and S2 normal without gallop, murmur, click, rub or other extra sounds. Abdomen:     No Suprapubic Tenderness. Msk:     No CVAT.    Impression & Recommendations:  Problem # 1:  UTI (ICD-599.0) Assessment: New  Her updated medication list for this problem includes:    Cipro 500 Mg Tabs (Ciprofloxacin hcl) ..... One tab by mouth two times a day  Orders: UA Dipstick W/ Micro (manual) (60454) UA Dipstick W/ Micro (manual) (09811)   Problem # 2:  URI (ICD-465.9) See instructions.   Complete Medication List: 1)  Nasonex 50 Mcg/act Susp (Mometasone furoate) .Marland Kitchen.. 1 spray each nostril as needed 2)  Avandia 8 Mg Tabs (Rosiglitazone maleate) .Marland Kitchen.. 1 tablet daily by mouth 3)  Quinapril Hcl 20 Mg Tabs (Quinapril hcl) .... Take one by mouth twice daily 4)  Lipitor 10 Mg Tabs (Atorvastatin calcium) .... Take one by mouth at bedtime 5)  Hydrochlorothiazide 25 Mg Tabs (Hydrochlorothiazide) .... Take one by mouth daily 6)  Metformin Hcl 1000 Mg Tabs (Metformin hcl) .... Take one by mouth twice a day. 7)  Levemir Flexpen 100 Unit/ml Soln (Insulin detemir) .... Inject 64 units subq daily dx 250.00 8)  Freestyle Test Strp (Glucose blood) .... Check blood sugar as directed qid as needed 9)  Bd Insulin Syringe Ultrafine 31g X 5/16" 0.3 Ml Misc (Insulin syringe-needle u-100) .... Use as directed  10)  Fish Oil Concentrate 1000 Mg Caps (Omega-3 fatty acids) .Marland Kitchen.. 1 by mouth twice daily 11)  Cipro 500 Mg Tabs (Ciprofloxacin hcl) .... One tab by mouth two times a day   Patient Instructions: 1)  Take Cipro. 2)  Take Guaifenesin by [facility] or [facility] and getting MUCOUS RELIEF EXPECTORANT (400mg ), take 11/2 tabs by mouth AM and NOON. 3)  Drink lots of fluids anytime taking Guaifenesin.   4)  Take Tyl ES 2 tabs by mouth three times a day. 5)  Urine Culture sent. RTC if sxs don't improve.   Prescriptions: CIPRO 500 MG TABS (CIPROFLOXACIN HCL) one tab by mouth two times a day  #20 x 0   Entered and Authorized by:   Shaune Leeks MD   Signed by:   Shaune Leeks MD on 12/30/2008   Method used:   [facility]        [id number]       [address]       [place], [place]  [address]       Ph: [phone] or [phone]       Fax: [phone]   RxID:   [id number]   Laboratory Results   Urine Tests  Date/Time Recieved: December 30, 2008 11:58 AM Date/Time Reported: December 30, 2008 11:58 AM  Routine Urinalysis   Color: yellow Appearance: Clear Glucose: negative   (Normal Range: Negative) Bilirubin: negative   (Normal Range: Negative) Ketone: negative   (Normal Range: Negative) Spec. Gravity: 1.015   (Normal Range: 1.003-1.035) Blood: trace-intact   (Normal Range: Negative) pH: 6.0   (Normal Range: 5.0-8.0) Protein: negative   (Normal Range: Negative) Urobilinogen: 0.2   (Normal Range: 0-1) Nitrite: negative   (Normal Range: Negative) Leukocyte Esterace: small   (Normal Range: Negative)  Urine Microscopic WBC/hpf: 8-20 RBC/hpf: 0-1 Bacteria: 1+ Epithelial: Rare        Appended Document: Orders Update    Clinical Lists Changes  Orders: Added new Test order of T-Culture, Urine ([id number]) - Signed

## 2010-12-10 NOTE — Progress Notes (Signed)
Summary: needs test strips  Phone Note Call from Patient Call back at Home Phone [phone]   Caller: Patient Call For: Shaune Leeks MD Summary of Call: Pt called to let you know that she has gotten a freestyle freedom lite glucometer and she will need the strips [facility]ets [facility] [place]   Initial call taken by: Lowella Petties CMA,  May 06, 2010 3:04 PM  Follow-up for Phone Call        PLease do so. Shaune Leeks MD  May 06, 2010 3:39 PM   Additional Follow-up for Phone Call Additional follow up Details #1::        Strips and lancets sent to pharmacy, changed in emr. Additional Follow-up by: Lowella Petties CMA,  May 06, 2010 4:54 PM    New/Updated Medications: FREESTYLE LITE TEST  STRP (GLUCOSE BLOOD) check blood sugar 4 times a day FREESTYLE LANCETS  MISC (LANCETS) check blood sugar 4 times a day. Prescriptions: FREESTYLE LANCETS  MISC (LANCETS) check blood sugar 4 times a day.  #100 x prn   Entered by:   Lowella Petties CMA   Authorized by:   Shaune Leeks MD   Signed by:   Lowella Petties CMA on 05/06/2010   Method used:   [facility]ph[facility] ...       [id number]       [address]       [place], [place]  [address]       Ph: [phone] or [phone]       Fax: [phone]   RxID:   [id number] FREESTYLE LITE TEST  STRP (GLUCOSE BLOOD) check blood sugar 4 times a day  #100 x prn   Entered by:   Lowella Petties CMA   Authorized by:   Shaune Leeks MD   Signed by:   Lowella Petties CMA on 05/06/2010   Method used:   Telephoned to ...       [id number]       [address]       [place], [place]  [address]       Ph: [phone] or [phone]       Fax: [phone]   RxID:   [id number]   Prior Medications: NASONEX 50 MCG/ACT SUSP (MOMETASONE FUROATE) 1 spray each nostril every day or as needed QUINAPRIL HCL 20 MG TABS (QUINAPRIL HCL)  Take one by mouth twice daily LIPITOR 10 MG TABS (ATORVASTATIN CALCIUM) Take one by mouth at bedtime HYDROCHLOROTHIAZIDE 25 MG TABS (HYDROCHLOROTHIAZIDE) Take one by mouth daily METFORMIN HCL 1000 MG  TABS (METFORMIN HCL) Take one by mouth twice a day. LEVEMIR FLEXPEN 100 UNIT/ML  SOLN (INSULIN DETEMIR) Inject 85 units SubQ daily Dx 250.00 FISH OIL CONCENTRATE 1000 MG  CAPS (OMEGA-3 FATTY ACIDS) 1 by mouth twice daily VITAMIN D 1000 UNIT TABS (CHOLECALCIFEROL) 1 daily by mouth BD PEN NEEDLE SHORT U/F 31G X 8 MM MISC (INSULIN PEN NEEDLE) use as directed ASPIRIN 81 MG TBEC (ASPIRIN) Take one by mouth daily CIPRO 500 MG TABS (CIPROFLOXACIN HCL) one tab by mouth two times a day for ten days. Current Allergies: PENICILLIN G POTASSIUM (PENICILLIN G POTASSIUM) * SULFA (SULFONAMIDES) GROUP

## 2010-12-10 NOTE — Letter (Signed)
Summary: Results Follow up Letter  [facility]  [address] [place], [place] [address]   Phone: [phone]  Fax: [phone]    12/20/2008 MRN: [id number]  [facility] [address] [place], [place]  [address]  Dear Gloria [name],  The following are the results of your recent test(s):  Test         Result    Pap Smear:        Normal _x____  Not Normal _____ Comments:   Your pap smear was normal. Please make sure to repeat in one year. Enclosed is a copy of your report. ______________________________________________________ Cholesterol: LDL(Bad cholesterol):         Your goal is less than:         HDL (Good cholesterol):       Your goal is more than: Comments:  ______________________________________________________ Mammogram:        Normal _____  Not Normal _____ Comments:  ___________________________________________________________________ Hemoccult:        Normal _____  Not normal _______ Comments:    _____________________________________________________________________ Other Tests:    We routinely do not discuss normal results over the telephone.  If you desire a copy of the results, or you have any questions about this information we can discuss them at your next office visit.   Sincerely,

## 2010-12-10 NOTE — Miscellaneous (Signed)
  Clinical Lists Changes  Observations: Added new observation of MAMMO DUE: 02.01.2012 (06/16/2010 14:34) Added new observation of MAMMOGRAM: F/U recommended in 6 mos. (06/16/2010 14:34)

## 2010-12-16 ENCOUNTER — Telehealth: Payer: Self-pay | Admitting: Family Medicine

## 2010-12-18 ENCOUNTER — Encounter: Payer: Self-pay | Admitting: Family Medicine

## 2010-12-18 LAB — HM MAMMOGRAPHY: HM Mammogram: NORMAL

## 2010-12-21 ENCOUNTER — Other Ambulatory Visit (INDEPENDENT_AMBULATORY_CARE_PROVIDER_SITE_OTHER): Payer: Medicare Other

## 2010-12-21 ENCOUNTER — Encounter (INDEPENDENT_AMBULATORY_CARE_PROVIDER_SITE_OTHER): Payer: Self-pay | Admitting: *Deleted

## 2010-12-21 ENCOUNTER — Other Ambulatory Visit: Payer: Self-pay | Admitting: Family Medicine

## 2010-12-21 ENCOUNTER — Encounter: Payer: Self-pay | Admitting: Family Medicine

## 2010-12-21 DIAGNOSIS — E119 Type 2 diabetes mellitus without complications: Secondary | ICD-10-CM

## 2010-12-21 DIAGNOSIS — I10 Essential (primary) hypertension: Secondary | ICD-10-CM

## 2010-12-21 DIAGNOSIS — E7800 Pure hypercholesterolemia, unspecified: Secondary | ICD-10-CM

## 2010-12-21 LAB — HEPATIC FUNCTION PANEL
ALT: 25 U/L (ref 0–35)
AST: 25 U/L (ref 0–37)
Albumin: 3.7 g/dL (ref 3.5–5.2)
Alkaline Phosphatase: 117 U/L (ref 39–117)
Bilirubin, Direct: 0.2 mg/dL (ref 0.0–0.3)
Total Bilirubin: 1.8 mg/dL — ABNORMAL HIGH (ref 0.3–1.2)
Total Protein: 6.4 g/dL (ref 6.0–8.3)

## 2010-12-21 LAB — LIPID PANEL
Cholesterol: 147 mg/dL (ref 0–200)
HDL: 34.4 mg/dL — ABNORMAL LOW (ref >39.00)
LDL Cholesterol: 96 mg/dL (ref 0–99)
Total CHOL/HDL Ratio: 4
Triglycerides: 81 mg/dL (ref 0.0–149.0)
VLDL: 16.2 mg/dL (ref 0.0–40.0)

## 2010-12-21 LAB — BASIC METABOLIC PANEL
BUN: 22 mg/dL (ref 6–23)
CO2: 27 mEq/L (ref 19–32)
Calcium: 9.6 mg/dL (ref 8.4–10.5)
Chloride: 100 mEq/L (ref 96–112)
Creatinine, Ser: 0.4 mg/dL (ref 0.4–1.2)
GFR: 156.5 mL/min (ref >60.00)
Glucose, Bld: 120 mg/dL — ABNORMAL HIGH (ref 70–99)
Potassium: 4.4 mEq/L (ref 3.5–5.1)
Sodium: 141 mEq/L (ref 135–145)

## 2010-12-21 LAB — HEMOGLOBIN A1C: Hgb A1c MFr Bld: 7.3 % — ABNORMAL HIGH (ref 4.6–6.5)

## 2010-12-22 ENCOUNTER — Encounter: Payer: Self-pay | Admitting: Family Medicine

## 2010-12-24 ENCOUNTER — Encounter: Payer: Medicare Other | Admitting: Family Medicine

## 2010-12-24 ENCOUNTER — Encounter (INDEPENDENT_AMBULATORY_CARE_PROVIDER_SITE_OTHER): Payer: Medicare Other | Admitting: Family Medicine

## 2010-12-24 ENCOUNTER — Encounter: Payer: Self-pay | Admitting: Family Medicine

## 2010-12-24 DIAGNOSIS — E7800 Pure hypercholesterolemia, unspecified: Secondary | ICD-10-CM

## 2010-12-24 DIAGNOSIS — J310 Chronic rhinitis: Secondary | ICD-10-CM

## 2010-12-24 DIAGNOSIS — Z23 Encounter for immunization: Secondary | ICD-10-CM

## 2010-12-24 DIAGNOSIS — E119 Type 2 diabetes mellitus without complications: Secondary | ICD-10-CM

## 2010-12-24 DIAGNOSIS — I10 Essential (primary) hypertension: Secondary | ICD-10-CM

## 2010-12-24 DIAGNOSIS — Z0110 Encounter for examination of ears and hearing without abnormal findings: Secondary | ICD-10-CM

## 2010-12-24 DIAGNOSIS — Z0100 Encounter for examination of eyes and vision without abnormal findings: Secondary | ICD-10-CM

## 2010-12-24 HISTORY — DX: Chronic rhinitis: J31.0

## 2010-12-24 LAB — HM DIABETES FOOT EXAM

## 2010-12-24 NOTE — Progress Notes (Signed)
Summary: needs order for dexa ASAP  Phone Note Call from Patient Call back at Home Phone [phone]   Caller: Patient Summary of Call: Pt has appt for a dexa on friday at 8:30 AM and she needs an order sent to [facility] in [place]. Initial call taken by: Lowella Petties CMA, AAMA,  December 16, 2010 12:00 PM  Follow-up for Phone Call        Please fax order for DXA.  Dx 733.90.  Thanks.  Follow-up by: Crawford Givens MD,  December 16, 2010 1:09 PM  Additional Follow-up for Phone Call Additional follow up Details #1::        Faxed. Additional Follow-up by: Delilah Shan CMA Helen Winterhalter Dull),  December 16, 2010 2:43 PM

## 2010-12-24 NOTE — Letter (Signed)
Summary: [facility]   Imported By: Kassie Mends 12/15/2010 08:15:30  _____________________________________________________________________  External Attachment:    Type:   Image     Comment:   External Document

## 2010-12-30 NOTE — Letter (Signed)
Summary: Results Follow up Letter  [place] [facility]  [address] [place], [place] [address]   Phone: [phone]  Fax: [phone]    12/21/2010 MRN: [id number]    [facility] [address] [place], [place]  [address]  [place]    Dear Gloria [name],  The following are the results of your recent test(s):  Test         Result    Pap Smear:        Normal _____  Not Normal _____ Comments: ______________________________________________________ Cholesterol: LDL(Bad cholesterol):         Your goal is less than:         HDL (Good cholesterol):       Your goal is more than: Comments:  ______________________________________________________ Mammogram:        Normal __X___  Not Normal _____ Comments:  Yearly follow up is recommended.   ___________________________________________________________________ Hemoccult:        Normal _____  Not normal _______ Comments:    _____________________________________________________________________ Other Tests:    We routinely do not discuss normal results over the telephone.  If you desire a copy of the results, or you have any questions about this information we can discuss them at your next office visit.   Sincerely,    Dwana Curd. Para March, M.D.  [facility]

## 2010-12-30 NOTE — Miscellaneous (Signed)
  Clinical Lists Changes  Observations: Added new observation of MAMMO DUE: 12/19/2011 (12/18/2010 11:51) Added new observation of MAMMOGRAM: Normal (12/18/2010 11:51)

## 2010-12-30 NOTE — Assessment & Plan Note (Signed)
Summary: cpe   Vital Signs:  Patient profile:   65 year old female Height:      65 inches Weight:      161.25 pounds BMI:     26.93 Temp:     98.4 degrees F oral Pulse rate:   84 / minute Pulse rhythm:   regular BP sitting:   142 / 76  (left arm) Cuff size:   regular  Vitals Entered By: Delilah Shan CMA Shevelle Smither Dull) (December 24, 2010 8:34 AM) CC: CPE, Preventive Care  Vision Screening:Left eye with correction: 20 / 25 Right eye with correction: 20 / 25 Both eyes with correction: 20 / 20        Vision Entered By: Delilah Shan CMA (AAMA) (December 24, 2010 8:42 AM)  Hearing Screen 25db HL: Left  500 hz: 25db 1000 hz: 25db 2000 hz: 25db 4000 hz: No Response Right  500 hz: 25db 1000 hz: 25db 2000 hz: No Response 4000 hz: 25db    History of Present Illness: CPE was tabled.    Diabetes:  Using medications without difficulties:yes Hypoglycemic episodes:some lower sugars in AM after she increased her exercise. she decreased her insulin to 80units and sugars now  ~90-100 Hyperglycemic episodes: no Feet problems:no Blood Sugars averaging:as above eye exam within last year:yes, at [facility]  Elevated Cholesterol: Using medications without problems:yes Muscle aches: no Other complaints:no  Hypertension:      Using medication without problems or lightheadedness: yes Chest pain with exertion:no Edema:no Short of breath:no  Nasal congestion in AM.  Nasonex helps some, but it wan't a proloned course.  We talked about this and she'll try a dedicated course of it.   osteopenia- DXA reviewed.  See plan.    Traveling to [place] and would like cipro in case of uti.    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[employer] no tobacco  Past Medical History: Diabetes mellitus, type II Hypertension Hyperlipidemia h/o cardiac murmur with normal echo 2011  Past Surgical History: NSVD x 2 Carpal Tunnel w/ Bilateral Releases R inguinal Hernia Repair (Dr Maple Hudson) L Breast Cystectomy R Lumpectomy B9  DEXA- Osteopenia Recheck 2 years 10/01 Uteroscopy without laser secondary stone 11/01 DEXA- Osteopenia has progressed  ~ 15% 09/10/2002 DEXA- Osteoipenia mildly Progressed   12/06/2007 DEXA- Osteopenia improved 11/2010 HOSP [facility] Urol Proc 10/22-10/24/10 [facility] for vaginal prolapse, Transvag Tape w/ Varitensor, Ant & Post Colporraphy, Uterosacral Lig susp, [place]               Culdoplasty 08/29/09  Social History: Reviewed history from 05/24/2007 and no changes required. 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  Otherwise negative.    Physical Exam  General:  GEN: nad, alert and oriented HEENT: mucous membranes moist, tm wnl, minimal nasal injection NECK: supple w/o LA CV: rrr.  SEM heard throughout the precordium and radiates up to the carotids bilaterally, no change from prev exam PULM: ctab, no inc wob ABD: soft, +bs EXT: no edema SKIN: no acute rash   Diabetes Management Exam:    Foot Exam (with socks and/or shoes not present):  Sensory-Pinprick/Light touch:          Left medial foot (L-4): normal          Left dorsal foot (L-5): normal          Left lateral foot (S-1): normal          Right medial foot (L-4): normal          Right dorsal foot (L-5): normal          Right lateral foot (S-1): normal       Sensory-Monofilament:          Left foot: normal          Right foot: normal       Inspection:          Left foot: normal          Right foot: normal       Nails:          Left foot: normal          Right foot: normal   Impression  & Recommendations:  Problem # 1:  DIABETES MELLITUS, TYPE II (ICD-250.00) Titrate levemir and then address with follow up A1c about potentially needing meal time insulin.  Continue diet and exercise.  Her updated medication list for this problem includes:    Quinapril Hcl 20 Mg Tabs (Quinapril hcl) .Marland Kitchen... Take one by mouth twice daily    Metformin Hcl 1000 Mg Tabs (Metformin hcl) .Marland Kitchen... Take one by mouth twice a day.    Levemir Flexpen 100 Unit/ml Soln (Insulin detemir) ..... Inject 80 units a day, add 1 if am sugar >120, decrease by 1 if am sugar <90.  if 91-119, no change    Aspirin 81 Mg Tbec (Aspirin) .Marland Kitchen... Take one by mouth daily  Problem # 2:  HYPERTENSION (ON ACE SECONDARY DM PRIOR) (ICD-401.9) No change in meds, continue with diet and exercise.  Her updated medication list for this problem includes:    Quinapril Hcl 20 Mg Tabs (Quinapril hcl) .Marland Kitchen... Take one by mouth twice daily    Hydrochlorothiazide 25 Mg Tabs (Hydrochlorothiazide) .Marland Kitchen... Take one by mouth daily  Problem # 3:  HYPERCHOLESTEROLEMIA ( 241 TRIG 225) (ICD-272.0) No change in meds.  Continue diet/exercise.  Her updated medication list for this problem includes:    Lipitor 10 Mg Tabs (Atorvastatin calcium) .Marland Kitchen... Take one by mouth at bedtime  Problem # 4:  RHINITIS (ICD-472.0) will have consistent use of nasonex and follow up if not improved.  she agrees.   Orders: Prescription Created Electronically [id number])  Problem # 5:  OSTEOPENIA (ICD-733.90) Improved.  recheck in 2-3 years.  continue vit D and exercise.  Her updated medication list for this problem includes:    Vitamin D 1000 Unit Tabs (Cholecalciferol) .Marland Kitchen... 1 daily by mouth  Complete Medication List: 1)  Nasonex 50 Mcg/act Susp (Mometasone furoate) .Marland Kitchen.. 1 spray each nostril every day or as needed 2)  Quinapril Hcl 20 Mg Tabs (Quinapril hcl) .... Take one by mouth twice daily 3)  Lipitor 10 Mg Tabs (Atorvastatin calcium) .... Take one by mouth at bedtime 4)   Hydrochlorothiazide 25 Mg Tabs (Hydrochlorothiazide) .... Take one by mouth daily 5)  Metformin Hcl 1000 Mg Tabs (Metformin hcl) .... Take one by mouth twice a day. 6)  Levemir Flexpen 100 Unit/ml Soln (Insulin detemir) .... Inject 80 units a day, add 1 if am sugar >120, decrease by 1 if am sugar <90.  if 91-119, no change 7)  Freestyle Lite Test Strp (Glucose blood) .... Check blood sugar 4 times a day 8)  Fish Oil Concentrate 1000 Mg Caps (Omega-3 fatty acids) .... Take 1 capsule by mouth once a day 9)  Vitamin D 1000 Unit Tabs (Cholecalciferol) .Marland Kitchen.. 1 daily by mouth 10)  Bd Pen Needle Short U/f 31g X 8 Mm Misc (Insulin pen needle) .... Use as directed 11)  Aspirin 81 Mg Tbec (Aspirin) .... Take one by mouth daily 12)  Freestyle Lancets Misc (Lancets) .... Check blood sugar 4 times a day. 13)  Cipro 250 Mg Tabs (Ciprofloxacin hcl) .Marland Kitchen.. 1 by mouth two times a day x3 days in case of uti  Other Orders: Audiometry [id number]) Vision Screening (78295) Pneumococcal Vaccine (62130) Admin 1st Vaccine (86578) Zoster (Shingles) Vaccine Live [phone]) Admin of Any Addtl Vaccine (95284)  PAP Screening:    Last PAP smear:  12/05/2008    Reviewed PAP smear recommendations:  Not indicated S/P hysterectomy  Mammogram Screening:    Last Mammogram:  12/18/2010  Osteoporosis Risk Assessment:  Risk Factors for Fracture or Low Bone Density:   Race (White or Asian):     yes   Smoking status:       never  Immunization & Chemoprophylaxis:    Tetanus vaccine: Td  (12/28/2002)    Influenza vaccine: Fluvax 3+  (08/27/2010)    Pneumovax: Pneumovax (Medicare)  (12/24/2010)  Patient Instructions: 1)  Check your pressure at home and let me know if the numbers are consistently >130/>90. 2)  Adjust your insulin as we discussed. 3)  Check your AM sugar along with prelunch and presupper for a few days before your next visit. 4)  3 month follow up.   5)  A1c before OV.   6)  Take the cipro if you have symptoms on  your trip. 7)  Use the nasonex daily and see if that helps.   8)  Take care.  Glad to see you today.  Prescriptions: NASONEX 50 MCG/ACT SUSP (MOMETASONE FUROATE) 1 spray each nostril every day or as needed  #1 x 12   Entered and Authorized by:   Crawford Givens MD   Signed by:   Crawford Givens MD on 12/24/2010   Method used:   [facility]ically to        [id number] (retail)       [address]       [place], [place]  [address]       Ph: [phone] or [phone]       Fax: [phone]   RxID:   [id number] CIPRO 250 MG TABS (CIPROFLOXACIN HCL) 1 by mouth two times a day x3 days in case of uti  #6 x 0   Entered and Authorized by:   Crawford Givens MD   Signed by:   Crawford Givens MD on 12/24/2010   Method used:   [facility] to        [id number] ([facility]ail[facility]       [address]       [place], [place]  [address]       Ph: [phone] or [phone]       Fax: [phone]   RxID:   [id number]    Orders Added: 1)  Audiometry [92552] 2)  Vision Screening [81829] 3)  Est. Patient Level IV [93716] 4)  Prescription  Created Electronically [H8469] 5)  Pneumococcal Vaccine [90732] 6)  Admin 1st Vaccine [90471] 7)  Zoster (Shingles) Vaccine Live [90736] 8)  Admin of Any Addtl Vaccine [90472]   Immunizations Administered:  Pneumonia Vaccine:    Vaccine Type: Pneumovax (Medicare)    Site: left deltoid    Mfr: Merck    Dose: 0.5 ml    Route: IM    Given by: Delilah Shan CMA (AAMA)    Exp. Date: 04/01/2012    Lot #: [id number]    VIS given: 10/13/09 version given December 24, 2010.  Zostavax # 1:    Vaccine Type: Zostavax    Site: Right arm    Mfr: Merck    Dose: 0.5 ml    Route: [place]    Given by: Delilah Shan CMA (AAMA)    Exp. Date: 06/26/2011    Lot #: [id number]    VIS given: 08/20/05 given December 24, 2010.   Immunizations Administered:  Pneumonia Vaccine:    Vaccine Type: Pneumovax (Medicare)    Site:  left deltoid    Mfr: Merck    Dose: 0.5 ml    Route: IM    Given by: Delilah Shan CMA (AAMA)    Exp. Date: 04/01/2012    Lot #: [id number]    VIS given: 10/13/09 version given December 24, 2010.  Zostavax # 1:    Vaccine Type: Zostavax    Site: Right arm    Mfr: Merck    Dose: 0.5 ml    Route: [place]    Given by: Delilah Shan CMA (AAMA)    Exp. Date: 06/26/2011    Lot #: [id number]    VIS given: 08/20/05 given December 24, 2010.  Current Allergies (reviewed today): PENICILLIN G POTASSIUM (PENICILLIN G POTASSIUM) * SULFA (SULFONAMIDES) GROUP      Prevention & Chronic Care Immunizations   Influenza vaccine: Fluvax 3+  (08/27/2010)    Tetanus booster: 12/28/2002: Td    Pneumococcal vaccine: Pneumovax (Medicare)  (12/24/2010)    H. zoster vaccine: 12/24/2010: Zostavax  Colorectal Screening   Hemoccult: negative  (12/31/2009)    Colonoscopy: DONE  (10/12/2010)   Colonoscopy due: 10/2015  Other Screening   Pap smear: Normal, Satisfactory  (12/05/2008)   Pap smear action/deferral: Not indicated S/P hysterectomy  (12/24/2010)   Pap smear due: 12/2009    Mammogram: Normal  (12/18/2010)   Mammogram due: 12/19/2011    DXA bone density scan: osteopenia, improved from prev dxa.   (12/18/2010)   DXA scan due: 12/2012    Smoking status: never  (12/16/2009)  Diabetes Mellitus   HgbA1C: 7.3  (12/21/2010)    Eye exam: normal  (12/20/2006)    Foot exam: yes  (12/24/2010)   High risk foot: Not documented   Foot care education: Not documented    Urine microalbumin/creatinine ratio: 48.7  (12/03/2009)  Lipids   Total Cholesterol: 147  (12/21/2010)   LDL: 96  (12/21/2010)   LDL Direct: Not documented   HDL: 34.40  (12/21/2010)   Triglycerides: 81.0  (12/21/2010)    SGOT (AST): 25  (12/21/2010)   SGPT (ALT): 25  (12/21/2010)   Alkaline phosphatase: 117  (12/21/2010)   Total bilirubin: 1.8  (12/21/2010)  Hypertension   Last Blood Pressure: 142 / 76  (12/24/2010)    Serum creatinine: 0.4  (12/21/2010)   Serum potassium 4.4  (12/21/2010)  Self-Management Support :    Diabetes self-management support: Not documented    Hypertension self-management support: Not documented    Lipid  self-management support: Not documented

## 2011-01-05 NOTE — Miscellaneous (Signed)
Summary: Waiver of Liability for Zostavax  Waiver of Liability for Zostavax   Imported By: Maryln Gottron 12/28/2010 15:41:59  _____________________________________________________________________  External Attachment:    Type:   Image     Comment:   External Document

## 2011-01-18 LAB — GLUCOSE, CAPILLARY
Glucose-Capillary: 105 mg/dL — ABNORMAL HIGH (ref 70–99)
Glucose-Capillary: 125 mg/dL — ABNORMAL HIGH (ref 70–99)

## 2011-01-27 ENCOUNTER — Other Ambulatory Visit: Payer: Self-pay | Admitting: Family Medicine

## 2011-01-27 NOTE — Telephone Encounter (Signed)
Patient sees Dr. Para March.

## 2011-02-19 ENCOUNTER — Encounter: Payer: Self-pay | Admitting: Family Medicine

## 2011-03-22 ENCOUNTER — Other Ambulatory Visit: Payer: Self-pay | Admitting: Family Medicine

## 2011-03-22 DIAGNOSIS — E119 Type 2 diabetes mellitus without complications: Secondary | ICD-10-CM

## 2011-03-23 ENCOUNTER — Other Ambulatory Visit (INDEPENDENT_AMBULATORY_CARE_PROVIDER_SITE_OTHER): Payer: Medicare Other

## 2011-03-23 DIAGNOSIS — E119 Type 2 diabetes mellitus without complications: Secondary | ICD-10-CM

## 2011-03-23 LAB — HEMOGLOBIN A1C: Hgb A1c MFr Bld: 7.9 % — ABNORMAL HIGH (ref 4.6–6.5)

## 2011-03-26 NOTE — Assessment & Plan Note (Signed)
[facility]                                   ON-CALL NOTE   NAME:HOOPERYoshiye, Kraft                       MRN:          [id number]  DATE:07/16/2006                            DOB:          07-09-1946    Telephone triage note on September 8, time received is 4:13 p.m.  The  patient is Gloria [name], the caller is the same.  She sees Dr. Hetty Ely.  Date of birth 29-Jun-1946.  Telephone [phone].  The patient has had  symptoms of what she thinks is a urinary tract infection for the past 2  days.  She had a history of frequent urinary infections, but this is her  first one in the past year.  Typically her symptoms are quite minimal, and  are usually marked by an aching pain in both sides of her low back.  In  fact, yesterday she began to have this same aching pain in the low back  again, as well as a mild increased urgency to urinate.  There is no fever or  nausea.  She just took an over-the-counter urine test for infection, and  reports that it is positive.  She is ALLERGIC TO PENICILLIN AND SULFA.  She is drinking lots of water.  My response is to call in Cipro 500 mg  b.i.d. to [facility] [place] at [phone].  She can  follow up as needed.                                   Tera Mater. Clent Ridges, MD   SAF/MedQ  DD:  07/16/2006  DT:  07/17/2006  Job #:  [id number]   cc:   Arta Silence, MD

## 2011-03-26 NOTE — Op Note (Signed)
[facility]  Patient:    Gloria [name], Gloria [name]                       MRN: [id number] Proc. Date: 09/26/00 Adm. Date:  [id number] Attending:  Thermon Leyland                           Operative Report  PREOPERATIVE DIAGNOSIS:  Left distal ureteral calculus.  POSTOPERATIVE DIAGNOSIS:  Left distal ureteral calculus.  OPERATION:  Cystoscopy, ureteroscopy, holmium laser lithotripsy, JJ stent placement.  SURGEON:  Barron Alvine, M.D.  ANESTHESIA:  General.  INDICATIONS:  [name] is a 65 year old female.  She has had several weeks of left flank and abdominal discomfort. She was initially treated for a questionable urinary tract infection.  That is when intravenous pyelogram was performed which showed a 5 x 12 mm left distal ureteral stone causing moderate left hydronephrosis.  We evaluated the patient recently in our office.  She had had symptoms for approximately 10 to 14 days.  We felt, give the size of the stone, there was a very low likelihood of spontaneous passage.  The stone did appear to be quite dense.  We discussed with her options which included continued observation, ESWL, or ureteroscopy with holmium lithotripsy.  We felt that the latter procedure would offer a higher likelihood of success with one single treatment.  The patient thought about her options and elected to have a ureteroscopy.  She understood the advantages and disadvantages of this approach, and all risks and benefits were discussed with her.  TECHNIQUE AND FINDINGS:  The patient was brought to the operating room where she had successful induction of general mask anesthesia.  She was placed in the lithotomy position and prepped and draped in the usual manner.  On fluoroscopy on the table, one could see the stone at the ureterovesical junction.  For that reason, we did not feel retrograde pyelography was necessary.  On cystoscopy, there was some edema of the left trigone.  A  glide wire was able to be passed beyond the stone and into the left renal pelvis.  A 6.5-French ureteroscope was then inserted, and the stone was encountered right at the ureterovesical junction.  It was partly embedded in the wall.  The stone was obviously much too large to basket extract.  For that reason, we utilized the holmium laser to fracture the stone.  The stone was quite hard, and I suspect calcium oxalate monohydrate in composition.  We were able to slowly chip away at the stone.  We were then able to basket several pieces. At the completion of the procedure, there were no obvious remaining fragments within the ureter.  Some fragments were obtained for stone analysis.  At the completion of the procedure, a 7-French 24 cm JJ stent was placed with fluoroscopic as well as direct visual guidance.  There were no obvious complications, and the patient appeared to tolerate the procedure well.  She was brought to the recovery room in stable condition. DD:  09/26/00 TD:  09/26/00 Job: [id number]

## 2011-03-29 ENCOUNTER — Ambulatory Visit (INDEPENDENT_AMBULATORY_CARE_PROVIDER_SITE_OTHER): Payer: Medicare Other | Admitting: Family Medicine

## 2011-03-29 ENCOUNTER — Encounter: Payer: Self-pay | Admitting: Family Medicine

## 2011-03-29 DIAGNOSIS — E119 Type 2 diabetes mellitus without complications: Secondary | ICD-10-CM

## 2011-03-29 DIAGNOSIS — R1900 Intra-abdominal and pelvic swelling, mass and lump, unspecified site: Secondary | ICD-10-CM

## 2011-03-29 DIAGNOSIS — E7800 Pure hypercholesterolemia, unspecified: Secondary | ICD-10-CM

## 2011-03-29 DIAGNOSIS — I10 Essential (primary) hypertension: Secondary | ICD-10-CM

## 2011-03-29 DIAGNOSIS — R1909 Other intra-abdominal and pelvic swelling, mass and lump: Secondary | ICD-10-CM | POA: Insufficient documentation

## 2011-03-29 MED ORDER — INSULIN DETEMIR 100 UNIT/ML ~~LOC~~ SOLN: SUBCUTANEOUS | Status: DC

## 2011-03-29 NOTE — Progress Notes (Signed)
She was walking and exercising more until the last month when she strained her back.  Much improved now. It was near the L SI joint.    Diabetes:  Using medications without difficulties:yes Hypoglycemic episodes:rare Hyperglycemic episodes:rare Feet problems:no Blood Sugars averaging: was <100, slight inc in last month for AM levels.   eye exam within last year: yes  Hypertension:    Using medication without problems or lightheadedness: yes Chest pain with exertion:no Edema:no Short of breath:no Average home BPs: similar to today, ~130/70-80.  Elevated Cholesterol: Using medications without problems:yes Muscle aches: no  L inguinal mass noted by patient.  PMH and SH reviewed.   Vital signs, Meds and allergies reviewed.  ROS: See HPI.  Otherwise nontributory.   GEN: nad, alert and oriented HEENT: mucous membranes moist NECK: supple w/o LA CV: rrr PULM: ctab, no inc wob ABD: soft, +bs EXT: no edema SKIN: no acute rash L inguinal area- small, <1cm mass, in area where she was shaving.  Not ttp.    Diabetic foot exam: Normal inspection No skin breakdown No calluses  Normal DP pulses Normal sensation to light tough and monofilament Nails normal

## 2011-03-29 NOTE — Patient Instructions (Signed)
Schedule a follow up appointment in: 3 months.  Labs ahead of time.   Come back in 3-4 weeks if the spot on your left leg isn't better.   Keep exercising and take care.

## 2011-03-30 ENCOUNTER — Encounter: Payer: Self-pay | Admitting: Family Medicine

## 2011-03-30 DIAGNOSIS — I10 Essential (primary) hypertension: Secondary | ICD-10-CM

## 2011-03-30 DIAGNOSIS — E7800 Pure hypercholesterolemia, unspecified: Secondary | ICD-10-CM

## 2011-03-30 HISTORY — DX: Pure hypercholesterolemia, unspecified: E78.00

## 2011-03-30 HISTORY — DX: Essential (primary) hypertension: I10

## 2011-03-30 NOTE — Assessment & Plan Note (Signed)
Work on diet/exercise, no change in meds.

## 2011-03-30 NOTE — Assessment & Plan Note (Signed)
Tolerating statin, no change in meds.

## 2011-03-30 NOTE — Assessment & Plan Note (Signed)
Labs d/w pt along with diet and exercise.  She'll work on exercise since her back is better and fu for repeat testing of A1c.  Adjust insulin as directed in meantime.  She agrees.

## 2011-03-30 NOTE — Assessment & Plan Note (Signed)
This could be reactive LA vs. Lipoma.  I would observe.  We could u/s if it persisted.  She agreed.  No alarm sx otherwise.  It may self resolve.  Call back if persistent.

## 2011-04-08 ENCOUNTER — Other Ambulatory Visit: Payer: Self-pay | Admitting: *Deleted

## 2011-04-08 MED ORDER — METFORMIN HCL 1000 MG PO TABS: 1000.0000 mg | ORAL_TABLET | Freq: Two times a day (BID) | ORAL | Status: DC

## 2011-04-08 MED ORDER — ATORVASTATIN CALCIUM 10 MG PO TABS: 10.0000 mg | ORAL_TABLET | Freq: Every day | ORAL | Status: DC

## 2011-06-03 ENCOUNTER — Other Ambulatory Visit: Payer: Self-pay | Admitting: Family Medicine

## 2011-06-07 ENCOUNTER — Other Ambulatory Visit: Payer: Self-pay | Admitting: Family Medicine

## 2011-06-24 ENCOUNTER — Other Ambulatory Visit (INDEPENDENT_AMBULATORY_CARE_PROVIDER_SITE_OTHER): Payer: BC Managed Care – PPO

## 2011-06-24 DIAGNOSIS — E119 Type 2 diabetes mellitus without complications: Secondary | ICD-10-CM

## 2011-06-24 LAB — HEMOGLOBIN A1C: Hgb A1c MFr Bld: 7.8 % — ABNORMAL HIGH (ref 4.6–6.5)

## 2011-06-29 ENCOUNTER — Encounter: Payer: Self-pay | Admitting: Family Medicine

## 2011-06-29 ENCOUNTER — Ambulatory Visit (INDEPENDENT_AMBULATORY_CARE_PROVIDER_SITE_OTHER): Payer: Medicare Other | Admitting: Family Medicine

## 2011-06-29 DIAGNOSIS — R1909 Other intra-abdominal and pelvic swelling, mass and lump: Secondary | ICD-10-CM

## 2011-06-29 DIAGNOSIS — R1900 Intra-abdominal and pelvic swelling, mass and lump, unspecified site: Secondary | ICD-10-CM

## 2011-06-29 DIAGNOSIS — Z8349 Family history of other endocrine, nutritional and metabolic diseases: Secondary | ICD-10-CM

## 2011-06-29 DIAGNOSIS — E119 Type 2 diabetes mellitus without complications: Secondary | ICD-10-CM

## 2011-06-29 NOTE — Progress Notes (Signed)
Diabetes:  Using medications without difficulties:yes Hypoglycemic episodes:very rare Hyperglycemic episodes:no Feet problems:no Blood Sugars averaging: 85-105 fasting A1c w/o sig change from prev  PMH and SH reviewed  Meds, vitals, and allergies reviewed.   ROS: See HPI.  Otherwise negative.    GEN: nad, alert and oriented HEENT: mucous membranes moist NECK: supple w/o LA CV: rrr. PULM: ctab, no inc wob ABD: soft, +bs EXT: no edema SKIN: no acute rash Chaperoned exam with B inguinal mass- small, <1cm mass.   Not ttp. No erythema.   Diabetic foot exam: Normal inspection No skin breakdown No calluses  Normal DP pulses Normal sensation to light touch and monofilament Nails normal

## 2011-06-29 NOTE — Patient Instructions (Signed)
I would total you calories for a day and then cut back by about 50-100 calories at supper.  Don't change you insulin for now.  Keep exercising.   I would get a flu shot each fall.   Recheck labs before physical in 12/2011.  Glad to see you.

## 2011-06-30 ENCOUNTER — Encounter: Payer: Self-pay | Admitting: Family Medicine

## 2011-06-30 NOTE — Assessment & Plan Note (Signed)
Small, not ttp, and no other red flag sx.  These may be lipomas.  We agreed to follow this clinically.  She agrees with plan.

## 2011-06-30 NOTE — Assessment & Plan Note (Addendum)
She'll cont exercise and cut calories by ~50-100 per day with calorie count.  Recheck A1c before next visit.  She agrees.  >25 min spent with face to face with patient, >50% counseling.

## 2011-09-28 ENCOUNTER — Ambulatory Visit (INDEPENDENT_AMBULATORY_CARE_PROVIDER_SITE_OTHER): Payer: Medicare Other

## 2011-09-28 DIAGNOSIS — Z23 Encounter for immunization: Secondary | ICD-10-CM

## 2011-12-20 ENCOUNTER — Encounter: Payer: Self-pay | Admitting: Family Medicine

## 2011-12-27 ENCOUNTER — Other Ambulatory Visit (INDEPENDENT_AMBULATORY_CARE_PROVIDER_SITE_OTHER): Payer: Medicare Other

## 2011-12-27 DIAGNOSIS — Z8349 Family history of other endocrine, nutritional and metabolic diseases: Secondary | ICD-10-CM

## 2011-12-27 DIAGNOSIS — E119 Type 2 diabetes mellitus without complications: Secondary | ICD-10-CM

## 2011-12-27 LAB — LIPID PANEL
Cholesterol: 142 mg/dL (ref 0–200)
HDL: 37.1 mg/dL — ABNORMAL LOW (ref >39.00)
LDL Cholesterol: 93 mg/dL (ref 0–99)
Total CHOL/HDL Ratio: 4
Triglycerides: 59 mg/dL (ref 0.0–149.0)
VLDL: 11.8 mg/dL (ref 0.0–40.0)

## 2011-12-27 LAB — COMPREHENSIVE METABOLIC PANEL
ALT: 23 U/L (ref 0–35)
AST: 19 U/L (ref 0–37)
Albumin: 4 g/dL (ref 3.5–5.2)
Alkaline Phosphatase: 106 U/L (ref 39–117)
BUN: 21 mg/dL (ref 6–23)
CO2: 28 mEq/L (ref 19–32)
Calcium: 10.2 mg/dL (ref 8.4–10.5)
Chloride: 106 mEq/L (ref 96–112)
Creatinine, Ser: 0.4 mg/dL (ref 0.4–1.2)
GFR: 179.93 mL/min (ref >60.00)
Glucose, Bld: 95 mg/dL (ref 70–99)
Potassium: 4.3 mEq/L (ref 3.5–5.1)
Sodium: 141 mEq/L (ref 135–145)
Total Bilirubin: 1.4 mg/dL — ABNORMAL HIGH (ref 0.3–1.2)
Total Protein: 6.9 g/dL (ref 6.0–8.3)

## 2011-12-27 LAB — TSH: TSH: 0.64 u[IU]/mL (ref 0.35–5.50)

## 2011-12-27 LAB — HEMOGLOBIN A1C: Hgb A1c MFr Bld: 7.5 % — ABNORMAL HIGH (ref 4.6–6.5)

## 2011-12-31 ENCOUNTER — Encounter: Payer: Self-pay | Admitting: Family Medicine

## 2011-12-31 ENCOUNTER — Ambulatory Visit (INDEPENDENT_AMBULATORY_CARE_PROVIDER_SITE_OTHER): Payer: Medicare Other | Admitting: Family Medicine

## 2011-12-31 VITALS — BP 126/80 | HR 81 | Temp 98.1°F | Ht 67.0 in | Wt 161.0 lb

## 2011-12-31 DIAGNOSIS — Z7189 Other specified counseling: Secondary | ICD-10-CM

## 2011-12-31 DIAGNOSIS — E7800 Pure hypercholesterolemia, unspecified: Secondary | ICD-10-CM

## 2011-12-31 DIAGNOSIS — Z0000 Encounter for general adult medical examination without abnormal findings: Secondary | ICD-10-CM

## 2011-12-31 DIAGNOSIS — E119 Type 2 diabetes mellitus without complications: Secondary | ICD-10-CM

## 2011-12-31 DIAGNOSIS — I10 Essential (primary) hypertension: Secondary | ICD-10-CM

## 2011-12-31 MED ORDER — FREESTYLE LANCETS MISC: Status: DC

## 2011-12-31 NOTE — Progress Notes (Signed)
I have personally reviewed the Medicare Annual Wellness questionnaire and have noted 1. The patient's medical and social history 2. Their use of alcohol, tobacco or illicit drugs 3. Their current medications and supplements 4. The patient's functional ability including ADL's, fall risks, home safety risks and hearing or visual             impairment. 5. Diet and physical activities 6. Evidence for depression or mood disorders  The patients weight, height, BMI have been recorded in the chart and visual acuity is per eye clinic.     I have made referrals, counseling and provided education to the patient based review of the above and I have provided the pt with a written personalized care plan for preventive services.  AD d/w pt.  She has talked to husband but doesn't have living will yet.  Full code in discussion with patient today.  Would not want prolonged interventions if she were profoundly and/or permanently impaired, ie severe dementia or a condition with no hope of improvement.    Diabetes:  Using medications without difficulties:yes Hypoglycemic episodes: yes, occ Hyperglycemic episodes: yes, occ Feet problems: no Blood Sugars averaging: ~100 in AM eye exam within last year: no but she'll call about that  Hypertension:    Using medication without problems or lightheadedness: yes Chest pain with exertion:no Edema:no Short of breath:no Other issues:no  Elevated Cholesterol: Using medications without problems: yes Muscle aches: no Diet compliance:yes Exercise:yes  PMH and SH reviewed.   Vital signs, Meds and allergies reviewed.  ROS: See HPI.  Otherwise nontributory.   GEN: nad, alert and oriented HEENT: mucous membranes moist NECK: supple w/o LA CV: rrr.  SEM noted, no change from prev PULM: ctab, no inc wob ABD: soft, +bs EXT: no edema SKIN: no acute rash  Diabetic foot exam: Normal inspection No skin breakdown No calluses  Normal DP pulses Normal  sensation to light tough and monofilament Nails normal

## 2011-12-31 NOTE — Patient Instructions (Addendum)
Ask for a bone density test in 2/14 with your next mammogram.   I would get a flu shot each fall.   Recheck A1c in 6 months.  Come see me after that.  Glad to see you.  Take care.

## 2012-01-03 DIAGNOSIS — Z0000 Encounter for general adult medical examination without abnormal findings: Secondary | ICD-10-CM | POA: Insufficient documentation

## 2012-01-03 DIAGNOSIS — Z7189 Other specified counseling: Secondary | ICD-10-CM

## 2012-01-03 HISTORY — DX: Other specified counseling: Z71.89

## 2012-01-03 HISTORY — DX: Encounter for general adult medical examination without abnormal findings: Z00.00

## 2012-01-03 NOTE — Assessment & Plan Note (Signed)
Controlled , no change in meds 

## 2012-01-03 NOTE — Assessment & Plan Note (Signed)
See scanned forms

## 2012-01-03 NOTE — Assessment & Plan Note (Signed)
Controlled, no change in meds. Labs d/w pt.  

## 2012-01-03 NOTE — Assessment & Plan Note (Signed)
Continue to work on diet, will not change meds at this point.  D/w pt about labs. Recheck later in 2013.

## 2012-01-05 ENCOUNTER — Other Ambulatory Visit: Payer: Self-pay | Admitting: *Deleted

## 2012-01-05 MED ORDER — INSULIN PEN NEEDLE 31G X 8 MM MISC: Status: DC

## 2012-01-24 ENCOUNTER — Other Ambulatory Visit: Payer: Self-pay | Admitting: Family Medicine

## 2012-02-09 ENCOUNTER — Other Ambulatory Visit: Payer: Self-pay | Admitting: Family Medicine

## 2012-04-13 ENCOUNTER — Other Ambulatory Visit: Payer: Self-pay | Admitting: Family Medicine

## 2012-04-20 ENCOUNTER — Encounter: Payer: Self-pay | Admitting: Family Medicine

## 2012-04-20 ENCOUNTER — Telehealth: Payer: Self-pay | Admitting: Family Medicine

## 2012-04-20 ENCOUNTER — Ambulatory Visit (INDEPENDENT_AMBULATORY_CARE_PROVIDER_SITE_OTHER): Payer: Medicare Other | Admitting: Family Medicine

## 2012-04-20 VITALS — BP 124/78 | HR 92 | Temp 97.9°F | Wt 161.8 lb

## 2012-04-20 DIAGNOSIS — R300 Dysuria: Secondary | ICD-10-CM

## 2012-04-20 DIAGNOSIS — R82998 Other abnormal findings in urine: Secondary | ICD-10-CM | POA: Insufficient documentation

## 2012-04-20 LAB — POCT URINALYSIS DIPSTICK
Bilirubin, UA: NEGATIVE
Glucose, UA: NEGATIVE
Ketones, UA: NEGATIVE
Nitrite, UA: NEGATIVE
Spec Grav, UA: >=1.03
Urobilinogen, UA: 0.2
pH, UA: 6

## 2012-04-20 MED ORDER — CIPROFLOXACIN HCL 500 MG PO TABS: 500.0000 mg | ORAL_TABLET | Freq: Two times a day (BID) | ORAL | Status: AC

## 2012-04-20 NOTE — Telephone Encounter (Signed)
Caller: Gloria [name]/Patient; PCP: Crawford Givens Clelia Croft); CB#: [phone]; ; ; Call regarding Urinary Pain/Bleeding;  Pt calling regarding postitive result for UTI per home OTC test. Pt states no sxs other than dark uring but "does not feel well". Disp: See provider w/in 24 hrs per Urinary Sxs-Female protocol. Appt made for 1115 with Dr. Sharen Hones per pt request. Could not make 1600 with Dr. Para March.

## 2012-04-20 NOTE — Assessment & Plan Note (Addendum)
Not typical sxs, however h/o UTI with nonspecific sxs in past. Question of contamination on microscopy today although pt did clean catch. Treat with abx and sent culture today.

## 2012-04-20 NOTE — Progress Notes (Signed)
  Subjective:    Patient ID: Gloria [name], female    DOB: [birth year], 66 y.o.   MRN: [id number]  HPI CC: "i think i have UTI"  Pleasant 65yo diabetic with urinary sxs.  Hasn't had UTI in a long time.  Urine yesterday darker than normal, also with strange sensation with voiding, but not necessarily burning.  3d ago with diarrhea, chills and abd pain.  Some malaise as well.  No frequency, urgency.  No flank pain, nausea/vomiting, fevers.  Prior UTI has presented with nonspecific sxs.  Keeps home test kit which was positive last visit.  Past Medical History  Diagnosis Date  . Diabetes mellitus     Type II  . Hypertension   . Hyperlipidemia   . Cardiac murmur 2011    with normal echo  . Allergic rhinitis      Review of Systems Per HPI    Objective:   Physical Exam  Nursing note and vitals reviewed. Constitutional: She appears well-developed and well-nourished. No distress.  Abdominal: Soft. Normal appearance and bowel sounds are normal. She exhibits no distension and no mass. There is no hepatosplenomegaly. There is no tenderness. There is no rigidity, no rebound, no guarding, no CVA tenderness and negative Murphy's sign.       Assessment & Plan:

## 2012-04-20 NOTE — Telephone Encounter (Signed)
Agreed -

## 2012-04-20 NOTE — Patient Instructions (Signed)
This may be a urinary infection - treat with 3 day course of antibiotic (ciprofloxacin) twice daily. Push fluids and rest, look into sugarfree cranberry juice. We have sent culture today. Update [place] if not improving after treatment.

## 2012-04-21 ENCOUNTER — Telehealth: Payer: Self-pay

## 2012-04-21 NOTE — Telephone Encounter (Signed)
Pt walked in;Started Cipro 04/20/12. This AM fine red rash all over body,itching and pt feels jumpy. No problem breathing and no swelling. Pt allergic to penicillin and sulfa. Dr Para March said stop Cipro,may take Claritin for itching if needed. Will wait for c/s before starting another antibiotic. Pt leaving for [facility] 6am on 04/25/12.[facility]. If difficulty breathing or swelling pt will go to ER if needed.

## 2012-04-21 NOTE — Telephone Encounter (Signed)
Agreed, will await c/s.

## 2012-04-22 LAB — URINE CULTURE
Colony Count: NO GROWTH
Organism ID, Bacteria: NO GROWTH

## 2012-06-28 ENCOUNTER — Other Ambulatory Visit (INDEPENDENT_AMBULATORY_CARE_PROVIDER_SITE_OTHER): Payer: Medicare Other

## 2012-06-28 DIAGNOSIS — E119 Type 2 diabetes mellitus without complications: Secondary | ICD-10-CM

## 2012-06-28 LAB — HEMOGLOBIN A1C: Hgb A1c MFr Bld: 7.7 % — ABNORMAL HIGH (ref 4.6–6.5)

## 2012-06-30 ENCOUNTER — Other Ambulatory Visit: Payer: Self-pay | Admitting: Family Medicine

## 2012-07-04 ENCOUNTER — Encounter: Payer: Self-pay | Admitting: Family Medicine

## 2012-07-04 ENCOUNTER — Ambulatory Visit (INDEPENDENT_AMBULATORY_CARE_PROVIDER_SITE_OTHER): Payer: Medicare Other | Admitting: Family Medicine

## 2012-07-04 VITALS — BP 132/84 | HR 84 | Temp 98.5°F | Wt 162.0 lb

## 2012-07-04 DIAGNOSIS — E119 Type 2 diabetes mellitus without complications: Secondary | ICD-10-CM

## 2012-07-04 NOTE — Progress Notes (Signed)
Diabetes:  Using medications without difficulties:yes Hypoglycemic episodes:no Hyperglycemic episodes:no Feet problems: intermittent changes in sensation on B 2nd toes in certain shoes.  Better with shoe changes.   Blood Sugars averaging: 114-120 eye exam within last year: due for eval in 11/13.  Inc in family stress recently.  Mother now in assisted living and he's helping cover her grandchildren.  Diet has been affected with this.   A1c 7.7.   We are trying to avoid nocturnal lows.  She has had none recently.   She's working on low carb diet.    PMH and SH reviewed  Meds, vitals, and allergies reviewed. al  ROS: See HPI.  Otherwise negative.    GEN: nad, alert and oriented HEENT: mucous membranes moist NECK: supple w/o LA CV: rrr. Murmur noted.  PULM: ctab, no inc wob ABD: soft, +bs EXT: no edema SKIN: no acute rash  Diabetic foot exam: Normal inspection No skin breakdown No calluses  Normal DP pulses Normal sensation to light touch and monofilament Nails normal

## 2012-07-04 NOTE — Patient Instructions (Addendum)
Schedule a physical in 6 months and get a lab appointment before the visit.   I would get a flu shot each fall.

## 2012-07-04 NOTE — Assessment & Plan Note (Signed)
Labs discussed. Continue diet, exercise and current meds.  She can titrate levemir as needed.  Okay for recheck in 6 months at CPE.  She agrees.  Toe sensation changes appear to be shoe related, not due to DM2.  Normal foot exam today.

## 2012-07-31 ENCOUNTER — Encounter: Payer: Self-pay | Admitting: Internal Medicine

## 2012-07-31 ENCOUNTER — Ambulatory Visit (INDEPENDENT_AMBULATORY_CARE_PROVIDER_SITE_OTHER): Payer: Medicare Other | Admitting: Internal Medicine

## 2012-07-31 VITALS — BP 138/70 | HR 93 | Temp 98.0°F | Wt 163.0 lb

## 2012-07-31 DIAGNOSIS — R319 Hematuria, unspecified: Secondary | ICD-10-CM

## 2012-07-31 DIAGNOSIS — R310 Gross hematuria: Secondary | ICD-10-CM

## 2012-07-31 LAB — POCT URINALYSIS DIPSTICK
Bilirubin, UA: NEGATIVE
Glucose, UA: NEGATIVE
Ketones, UA: NEGATIVE
Leukocytes, UA: NEGATIVE
Nitrite, UA: NEGATIVE
Spec Grav, UA: <=1.005
Urobilinogen, UA: NEGATIVE
pH, UA: 6

## 2012-07-31 NOTE — Progress Notes (Signed)
Subjective:    Patient ID: Gloria [name], female    DOB: 03/07/1946, 66 y.o.   MRN: [id number]  HPI Got up this AM, didn't see first void Then went out and walked 2 miles-- felt okay Then voided and there was blood in there Has had some pain in right low back--started about a week ago Has tried some aleve for this  No pain with voiding Some increased frequency but no real urgency Has known kidney stones---little ones  Current Outpatient Prescriptions on File Prior to Visit  Medication Sig Dispense Refill  . aspirin 81 MG tablet Take 81 mg by mouth daily.        Marland Kitchen atorvastatin (LIPITOR) 10 MG tablet TAKE 1 TABLET BY MOUTH EVERY DAY  30 tablet  11  . bisacodyl (DULCOLAX) 5 MG EC tablet Take 5 mg by mouth daily as needed.        . Cholecalciferol (VITAMIN D) 1000 UNITS capsule Take 1,000 Units by mouth daily.        . fish oil-omega-3 fatty acids 1000 MG capsule Take 1 g by mouth daily.        Marland Kitchen FREESTYLE LITE test strip CHECK BLOOD SUGAR 4 TIMES A DAY AS NEEDED  120 each  11  . hydrochlorothiazide (HYDRODIURIL) 25 MG tablet TAKE 1 TABLET BY MOUTH EVERY DAY  30 tablet  11  . insulin detemir (LEVEMIR FLEXPEN) 100 UNIT/ML injection Inject 88 Units into the skin at bedtime.       . Insulin Pen Needle (B-D ULTRAFINE III SHORT PEN) 31G X 8 MM MISC Use as directed  100 each  12  . Lancets (FREESTYLE) lancets Check blood sugar 3 times per day. H/o fluctuating blood glucose requiring mult checks per day  100 each  12  . metFORMIN (GLUCOPHAGE) 1000 MG tablet TAKE 1 TABLET BY MOUTH TWICE A DAY WITH A MEAL  60 tablet  11  . mometasone (NASONEX) 50 MCG/ACT nasal spray Place 2 sprays into the nose daily as needed.       . quinapril (ACCUPRIL) 20 MG tablet TAKE 1 TABLET BY MOUTH TWICE A DAY  60 tablet  11    Allergies  Allergen Reactions  . Penicillins     REACTION: rash  . Sulfonamide Derivatives     REACTION: unspecified  . Ciprofloxin Hcl (Ciprofloxacin) Rash    itching    Past Medical  History  Diagnosis Date  . Diabetes mellitus     Type II  . Hypertension   . Hyperlipidemia   . Cardiac murmur 2011    with normal echo  . Allergic rhinitis     Past Surgical History  Procedure Date  . Nsvd     X 2  . Carpal tunnel release     with bilateral releases  . Inguinal hernia repair     Right (Dr. Maple Hudson)  . Cystectomy     Left breast, right lumpectomy B9  . Ueteroscopy 11/01    without laser secondary stone  . Urological procedure 10/22 - 08/31/09    [facility]  . Tvh with vaginal prolapse 08/29/09    Transvag Tape w/Varitensor, Ant & Post Colporraphy, Uterosacral Lig susp, McCall Culdoplasty    Family History  Problem Relation Age of Onset  . Hypertension Mother   . Heart disease Mother     CHF  . Stroke Maternal Aunt   . Diabetes Paternal Aunt 48  . Stroke Paternal Aunt   . Heart disease Paternal Uncle  MI  . Heart disease Paternal Uncle     MI  . Cancer Cousin     Breast CA  . Alcohol abuse Cousin   . Breast cancer Cousin   . Thyroid disease Other   . Colon cancer Neg Hx     History   Social History  . Marital Status: Married    Spouse Name: N/A    Number of Children: 2  . Years of Education: N/A   Occupational History  . Retired Runner, broadcasting/film/video (primary education) from the [employer]    Social History Main Topics  . Smoking status: Never Smoker   . Smokeless tobacco: Never Used  . Alcohol Use: Yes     rare  . Drug Use: No  . Sexually Active: Not on file   Other Topics Concern  . Not on file   Social History Narrative   Retired Radio broadcast assistant Status: Married, 1968Children: 2 children out of the home   Review of Systems Appetite is okay No fever No nausea or vomiting     Objective:   Physical Exam  Constitutional: She appears well-developed and well-nourished. No distress.  Abdominal: Soft. There is no tenderness. There is no rebound.  Musculoskeletal:       No CVA tenderness Mild low back pain on right but no real  tenderness          Assessment & Plan:

## 2012-07-31 NOTE — Assessment & Plan Note (Signed)
No evidence of infection Have to presume stone at this point Will check non contrast CT  If no stone, will need urology evaluation (Non smoker)

## 2012-08-01 ENCOUNTER — Ambulatory Visit (INDEPENDENT_AMBULATORY_CARE_PROVIDER_SITE_OTHER)
Admission: RE | Admit: 2012-08-01 | Discharge: 2012-08-01 | Disposition: A | Discharge status: Home / Self Care | Payer: Medicare Other | Source: Ambulatory Visit | Attending: Internal Medicine | Admitting: Internal Medicine

## 2012-08-01 DIAGNOSIS — R310 Gross hematuria: Secondary | ICD-10-CM

## 2012-08-02 ENCOUNTER — Telehealth: Payer: Self-pay | Admitting: Internal Medicine

## 2012-08-02 DIAGNOSIS — N132 Hydronephrosis with renal and ureteral calculous obstruction: Secondary | ICD-10-CM

## 2012-08-02 HISTORY — DX: Hydronephrosis with renal and ureteral calculous obstruction: N13.2

## 2012-08-02 NOTE — Telephone Encounter (Signed)
CT does show small ureteral stone with early hydronephrosis Will proceed with urology evaluation

## 2012-08-03 NOTE — Telephone Encounter (Signed)
Appt made with Dr Isabel Caprice [facility] and patient aware.

## 2012-08-10 ENCOUNTER — Encounter: Payer: Self-pay | Admitting: Family Medicine

## 2012-08-10 DIAGNOSIS — N8110 Cystocele, unspecified: Secondary | ICD-10-CM

## 2012-08-10 HISTORY — DX: Cystocele, unspecified: N81.10

## 2012-12-20 ENCOUNTER — Other Ambulatory Visit: Payer: Self-pay | Admitting: Family Medicine

## 2012-12-20 DIAGNOSIS — M8580 Other specified disorders of bone density and structure, unspecified site: Secondary | ICD-10-CM

## 2012-12-20 DIAGNOSIS — E119 Type 2 diabetes mellitus without complications: Secondary | ICD-10-CM

## 2012-12-26 ENCOUNTER — Other Ambulatory Visit (INDEPENDENT_AMBULATORY_CARE_PROVIDER_SITE_OTHER): Payer: Medicare Other

## 2012-12-26 DIAGNOSIS — E119 Type 2 diabetes mellitus without complications: Secondary | ICD-10-CM

## 2012-12-26 DIAGNOSIS — M8580 Other specified disorders of bone density and structure, unspecified site: Secondary | ICD-10-CM

## 2012-12-26 LAB — COMPREHENSIVE METABOLIC PANEL
ALT: 24 U/L (ref 0–35)
AST: 18 U/L (ref 0–37)
Albumin: 3.8 g/dL (ref 3.5–5.2)
Alkaline Phosphatase: 109 U/L (ref 39–117)
BUN: 16 mg/dL (ref 6–23)
CO2: 29 mEq/L (ref 19–32)
Calcium: 10.4 mg/dL (ref 8.4–10.5)
Chloride: 105 mEq/L (ref 96–112)
Creatinine, Ser: 0.5 mg/dL (ref 0.4–1.2)
GFR: 140.36 mL/min (ref >60.00)
Glucose, Bld: 103 mg/dL — ABNORMAL HIGH (ref 70–99)
Potassium: 4.4 mEq/L (ref 3.5–5.1)
Sodium: 140 mEq/L (ref 135–145)
Total Bilirubin: 2.1 mg/dL — ABNORMAL HIGH (ref 0.3–1.2)
Total Protein: 7.1 g/dL (ref 6.0–8.3)

## 2012-12-26 LAB — LIPID PANEL
Cholesterol: 134 mg/dL (ref 0–200)
HDL: 34.5 mg/dL — ABNORMAL LOW (ref >39.00)
LDL Cholesterol: 82 mg/dL (ref 0–99)
Total CHOL/HDL Ratio: 4
Triglycerides: 89 mg/dL (ref 0.0–149.0)
VLDL: 17.8 mg/dL (ref 0.0–40.0)

## 2012-12-26 LAB — HEMOGLOBIN A1C: Hgb A1c MFr Bld: 7.7 % — ABNORMAL HIGH (ref 4.6–6.5)

## 2012-12-27 LAB — VITAMIN D 25 HYDROXY (VIT D DEFICIENCY, FRACTURES): Vit D, 25-Hydroxy: 35 ng/mL (ref 30–89)

## 2012-12-28 ENCOUNTER — Encounter: Payer: Self-pay | Admitting: Family Medicine

## 2012-12-29 ENCOUNTER — Encounter: Payer: Self-pay | Admitting: *Deleted

## 2013-01-02 ENCOUNTER — Encounter: Payer: Self-pay | Admitting: Family Medicine

## 2013-01-02 ENCOUNTER — Ambulatory Visit (INDEPENDENT_AMBULATORY_CARE_PROVIDER_SITE_OTHER): Payer: Medicare Other | Admitting: Family Medicine

## 2013-01-02 VITALS — BP 116/64 | HR 71 | Temp 98.5°F | Ht 66.0 in | Wt 161.0 lb

## 2013-01-02 DIAGNOSIS — E119 Type 2 diabetes mellitus without complications: Secondary | ICD-10-CM

## 2013-01-02 DIAGNOSIS — Z0000 Encounter for general adult medical examination without abnormal findings: Secondary | ICD-10-CM

## 2013-01-02 DIAGNOSIS — I10 Essential (primary) hypertension: Secondary | ICD-10-CM

## 2013-01-02 DIAGNOSIS — Z1331 Encounter for screening for depression: Secondary | ICD-10-CM

## 2013-01-02 DIAGNOSIS — E7800 Pure hypercholesterolemia, unspecified: Secondary | ICD-10-CM

## 2013-01-02 DIAGNOSIS — Z23 Encounter for immunization: Secondary | ICD-10-CM

## 2013-01-02 NOTE — Patient Instructions (Signed)
Recheck A1c in 6 months before a visit.  Take care.  Glad to see you.

## 2013-01-02 NOTE — Progress Notes (Signed)
I have personally reviewed the Medicare Annual Wellness questionnaire and have noted 1. The patient's medical and social history 2. Their use of alcohol, tobacco or illicit drugs 3. Their current medications and supplements 4. The patient's functional ability including ADL's, fall risks, home safety risks and hearing or visual             impairment. 5. Diet and physical activities 6. Evidence for depression or mood disorders  The patients weight, height, BMI have been recorded in the chart and visual acuity is per eye clinic.  I have made referrals, counseling and provided education to the patient based review of the above and I have provided the pt with a written personalized care plan for preventive services.  See scanned forms.  Routine anticipatory guidance given to patient.  See health maintenance. Flu 2013 Shingles 2012 [id number] Tetanus 2014 Colonoscopy 2011 Breast cancer screening up to date.  Advance directive d/w pt.   Cognitive function addressed- see scanned forms- and if abnormal then additional documentation follows.   Diabetes:  Using medications without difficulties: yes Hypoglycemic episodes:no Hyperglycemic episodes:no Feet problems:no Blood Sugars averaging: ~75-120 in AM, pre lunch ~110, at bedtime usually ~180 eye exam within last year: f/u pending.  A1c 7.7  Hypertension:    Using medication without problems or lightheadedness: yes Chest pain with exertion:no Edema:no Short of breath:no  Elevated Cholesterol: Using medications without problems:yes Muscle aches: no Diet compliance:yes Exercise:yes  PMH and SH reviewed.   Vital signs, Meds and allergies reviewed.  ROS: See HPI.  Otherwise nontributory.   GEN: nad, alert and oriented HEENT: mucous membranes moist NECK: supple w/o LA CV: rrr.  Murmur noted.  PULM: ctab, no inc wob ABD: soft, +bs EXT: no edema SKIN: no acute rash  Diabetic foot exam: Normal inspection No skin breakdown No  calluses  Normal DP pulses Normal sensation to light tough and monofilament Nails normal

## 2013-01-03 NOTE — Assessment & Plan Note (Signed)
Continue as is.  She'll work on diet and exercise.  We won't add mealtime insulin at this point.  Recheck later in 2014.  She agrees.

## 2013-01-03 NOTE — Assessment & Plan Note (Signed)
Controlled, continue current meds. D/w pt about diet and exercise. 

## 2013-01-03 NOTE — Assessment & Plan Note (Signed)
See scanned forms.  Routine anticipatory guidance given to patient.  See health maintenance. Flu 2013 Shingles 2012 [id number] Tetanus 2014 Colonoscopy 2011 Breast cancer screening up to date.  Advance directive d/w pt.   Cognitive function addressed- see scanned forms- and if abnormal then additional documentation follows.

## 2013-01-04 ENCOUNTER — Encounter: Payer: Self-pay | Admitting: *Deleted

## 2013-01-25 ENCOUNTER — Other Ambulatory Visit: Payer: Self-pay | Admitting: Family Medicine

## 2013-02-08 ENCOUNTER — Other Ambulatory Visit: Payer: Self-pay | Admitting: Family Medicine

## 2013-02-15 ENCOUNTER — Other Ambulatory Visit: Payer: Self-pay | Admitting: Family Medicine

## 2013-04-23 ENCOUNTER — Other Ambulatory Visit: Payer: Self-pay | Admitting: Family Medicine

## 2013-05-20 ENCOUNTER — Other Ambulatory Visit: Payer: Self-pay | Admitting: Family Medicine

## 2013-06-26 ENCOUNTER — Other Ambulatory Visit (INDEPENDENT_AMBULATORY_CARE_PROVIDER_SITE_OTHER): Payer: Medicare Other

## 2013-06-26 DIAGNOSIS — E119 Type 2 diabetes mellitus without complications: Secondary | ICD-10-CM

## 2013-06-26 LAB — HEMOGLOBIN A1C: Hgb A1c MFr Bld: 8.3 % — ABNORMAL HIGH (ref 4.6–6.5)

## 2013-07-03 ENCOUNTER — Ambulatory Visit (INDEPENDENT_AMBULATORY_CARE_PROVIDER_SITE_OTHER): Payer: Medicare Other | Admitting: Family Medicine

## 2013-07-03 ENCOUNTER — Encounter: Payer: Self-pay | Admitting: Family Medicine

## 2013-07-03 VITALS — BP 114/62 | HR 91 | Temp 98.1°F | Wt 160.5 lb

## 2013-07-03 DIAGNOSIS — E119 Type 2 diabetes mellitus without complications: Secondary | ICD-10-CM

## 2013-07-03 MED ORDER — GLUCOSE BLOOD VI STRP: ORAL_STRIP | Status: DC

## 2013-07-03 NOTE — Progress Notes (Signed)
Diabetes:  Using medications without difficulties: yes Hypoglycemic episodes:only if prolonged fasting Hyperglycemic episodes:no Feet problems:no Blood Sugars averaging: 112-120 in AM, not checked after meals eye exam within last year:yes A1c is elevated at 8.3.  Diet discussed.  Adherent.   Son with prostate cancer, s/p surgery.  Daughter in law recently with surgery for her back.  Pt has been helping with both.  This has been disruptive.   Meds, vitals, and allergies reviewed.   ROS: See HPI.  Otherwise negative.    GEN: nad, alert and oriented HEENT: mucous membranes moist NECK: supple w/o LA CV: rrr. Murmur noted.  PULM: ctab, no inc wob ABD: soft, +bs EXT: no edema SKIN: no acute rash  Diabetic foot exam: Normal inspection No skin breakdown No calluses  Normal DP pulses Normal sensation to light touch and monofilament Nails normal

## 2013-07-03 NOTE — Patient Instructions (Addendum)
Take care. Check your sugar and fill out the grid.  Send it to me and we'll go from there.  Glad to see you.

## 2013-07-03 NOTE — Assessment & Plan Note (Signed)
A1c elevated,  She'll check pre/post meal sugars and update me.  She may need meal insulin.  Discussed.  She agrees.  We'll proceed when she updates me.  Grid given to patient for a sugar log.

## 2013-07-06 ENCOUNTER — Other Ambulatory Visit: Payer: Self-pay | Admitting: *Deleted

## 2013-07-06 MED ORDER — GLUCOSE BLOOD VI STRP: ORAL_STRIP | Status: DC

## 2013-07-19 ENCOUNTER — Telehealth: Payer: Self-pay | Admitting: Family Medicine

## 2013-07-19 DIAGNOSIS — E119 Type 2 diabetes mellitus without complications: Secondary | ICD-10-CM

## 2013-07-19 NOTE — Telephone Encounter (Signed)
Patient advised.  Lab appt scheduled. OV scheduled. 

## 2013-07-19 NOTE — Telephone Encounter (Signed)
I called pt and LMOVM.    Please call pt.  I checked her sugar log.  Good news.  At this point, I am worried that if we add on mealtime insulin she may drop too low.  I would continue as is for now and work on diet/weight w/o a med change.  I would check another A1c before a visit in about 3-4 months.  Have her bring a few pre/post meal sugars to that OV.  We'll go from there.  Thanks.

## 2013-07-22 ENCOUNTER — Other Ambulatory Visit: Payer: Self-pay | Admitting: Family Medicine

## 2013-08-09 ENCOUNTER — Ambulatory Visit: Payer: Medicare Other

## 2013-08-16 ENCOUNTER — Ambulatory Visit: Payer: Medicare Other

## 2013-08-16 ENCOUNTER — Other Ambulatory Visit: Payer: Self-pay | Admitting: *Deleted

## 2013-10-18 ENCOUNTER — Other Ambulatory Visit (INDEPENDENT_AMBULATORY_CARE_PROVIDER_SITE_OTHER): Payer: Medicare Other

## 2013-10-18 DIAGNOSIS — E119 Type 2 diabetes mellitus without complications: Secondary | ICD-10-CM

## 2013-10-18 LAB — HEMOGLOBIN A1C: Hgb A1c MFr Bld: 7.7 % — ABNORMAL HIGH (ref 4.6–6.5)

## 2013-10-22 ENCOUNTER — Ambulatory Visit (INDEPENDENT_AMBULATORY_CARE_PROVIDER_SITE_OTHER): Payer: Medicare Other | Admitting: Family Medicine

## 2013-10-22 ENCOUNTER — Encounter: Payer: Self-pay | Admitting: Family Medicine

## 2013-10-22 VITALS — BP 114/68 | HR 81 | Temp 98.6°F | Wt 160.0 lb

## 2013-10-22 DIAGNOSIS — E119 Type 2 diabetes mellitus without complications: Secondary | ICD-10-CM

## 2013-10-22 NOTE — Patient Instructions (Signed)
Take care.  Keep working on [employer].   Don't change your meds.  Recheck at a physical in 12/2013.

## 2013-10-22 NOTE — Assessment & Plan Note (Signed)
Improved.  D/w pt.  Would defer mealtime insulin for now.  She may need it in the future.  Recheck in about 3 months at a CPE.  She agrees. Continue diet and exercise.

## 2013-10-22 NOTE — Progress Notes (Signed)
Pre-visit discussion using our clinic review tool. No additional management support is needed unless otherwise documented below in the visit note.  Son with prostate cancer, s/p surgery a few months ago. Daughter in law recently with surgery for her back, she is doing better.  Pt's schedule is improved and this helped with DM2 control, esp diet.    Diabetes:  Using medications without difficulties:yes Hypoglycemic episodes:rare  Hyperglycemic episodes:no Feet problems: no loss of sensatoin Blood Sugars averaging: ~100 eye exam within last year: yes, has f/u for 2/15 A1c improved to 7.7. Flu shot prev done.   She is back on her exercise routine now.   Meds, vitals, and allergies reviewed.   ROS: See HPI.  Otherwise negative.    GEN: nad, alert and oriented HEENT: mucous membranes moist NECK: supple w/o LA CV: rrr. PULM: ctab, no inc wob ABD: soft, +bs EXT: no edema SKIN: no acute rash

## 2013-10-29 ENCOUNTER — Other Ambulatory Visit: Payer: Self-pay | Admitting: *Deleted

## 2013-10-29 MED ORDER — METFORMIN HCL 1000 MG PO TABS: ORAL_TABLET | ORAL | Status: DC

## 2013-10-29 MED ORDER — ATORVASTATIN CALCIUM 10 MG PO TABS: ORAL_TABLET | ORAL | Status: DC

## 2013-10-29 NOTE — Telephone Encounter (Signed)
Received faxed refill request from pharmacy. Refill sent to pharmacy electronically. 

## 2013-12-09 LAB — HM DIABETES EYE EXAM

## 2013-12-31 ENCOUNTER — Encounter: Payer: Self-pay | Admitting: Family Medicine

## 2014-01-01 ENCOUNTER — Encounter: Payer: Self-pay | Admitting: Family Medicine

## 2014-01-07 ENCOUNTER — Other Ambulatory Visit (INDEPENDENT_AMBULATORY_CARE_PROVIDER_SITE_OTHER): Payer: Medicare Other

## 2014-01-07 DIAGNOSIS — E119 Type 2 diabetes mellitus without complications: Secondary | ICD-10-CM

## 2014-01-07 LAB — LIPID PANEL
Cholesterol: 129 mg/dL (ref 0–200)
HDL: 35.3 mg/dL — ABNORMAL LOW (ref >39.00)
LDL Cholesterol: 78 mg/dL (ref 0–99)
Total CHOL/HDL Ratio: 4
Triglycerides: 79 mg/dL (ref 0.0–149.0)
VLDL: 15.8 mg/dL (ref 0.0–40.0)

## 2014-01-07 LAB — COMPREHENSIVE METABOLIC PANEL
ALT: 23 U/L (ref 0–35)
AST: 19 U/L (ref 0–37)
Albumin: 3.7 g/dL (ref 3.5–5.2)
Alkaline Phosphatase: 101 U/L (ref 39–117)
BUN: 18 mg/dL (ref 6–23)
CO2: 27 mEq/L (ref 19–32)
Calcium: 10.1 mg/dL (ref 8.4–10.5)
Chloride: 105 mEq/L (ref 96–112)
Creatinine, Ser: 0.5 mg/dL (ref 0.4–1.2)
GFR: 143.45 mL/min (ref >60.00)
Glucose, Bld: 135 mg/dL — ABNORMAL HIGH (ref 70–99)
Potassium: 4.6 mEq/L (ref 3.5–5.1)
Sodium: 139 mEq/L (ref 135–145)
Total Bilirubin: 1.7 mg/dL — ABNORMAL HIGH (ref 0.3–1.2)
Total Protein: 6.8 g/dL (ref 6.0–8.3)

## 2014-01-07 LAB — TSH: TSH: 0.57 u[IU]/mL (ref 0.35–5.50)

## 2014-01-07 LAB — HEMOGLOBIN A1C: Hgb A1c MFr Bld: 7.9 % — ABNORMAL HIGH (ref 4.6–6.5)

## 2014-01-11 ENCOUNTER — Encounter: Payer: Self-pay | Admitting: Family Medicine

## 2014-01-11 ENCOUNTER — Ambulatory Visit (INDEPENDENT_AMBULATORY_CARE_PROVIDER_SITE_OTHER): Payer: Medicare Other | Admitting: Family Medicine

## 2014-01-11 VITALS — BP 126/70 | HR 81 | Temp 98.0°F | Ht 66.0 in | Wt 161.5 lb

## 2014-01-11 DIAGNOSIS — I10 Essential (primary) hypertension: Secondary | ICD-10-CM

## 2014-01-11 DIAGNOSIS — Z0000 Encounter for general adult medical examination without abnormal findings: Secondary | ICD-10-CM

## 2014-01-11 DIAGNOSIS — E7800 Pure hypercholesterolemia, unspecified: Secondary | ICD-10-CM

## 2014-01-11 DIAGNOSIS — E119 Type 2 diabetes mellitus without complications: Secondary | ICD-10-CM

## 2014-01-11 DIAGNOSIS — IMO0002 Reserved for concepts with insufficient information to code with codable children: Secondary | ICD-10-CM

## 2014-01-11 NOTE — Patient Instructions (Addendum)
Call urology about a pessary.  Recheck labs in about 6 months before a visit.   Keep working on diet and walking.

## 2014-01-11 NOTE — Progress Notes (Signed)
Pre visit review using our clinic review tool, if applicable. No additional management support is needed unless otherwise documented below in the visit note.  I have personally reviewed the Medicare Annual Wellness questionnaire and have noted 1. The patient's medical and social history 2. Their use of alcohol, tobacco or illicit drugs 3. Their current medications and supplements 4. The patient's functional ability including ADL's, fall risks, home safety risks and hearing or visual             impairment. 5. Diet and physical activities 6. Evidence for depression or mood disorders  The patients weight, height, BMI have been recorded in the chart and visual acuity is per eye clinic.  I have made referrals, counseling and provided education to the patient based review of the above and I have provided the pt with a written personalized care plan for preventive services.  See scanned forms.  Routine anticipatory guidance given to patient.  See health maintenance. Flu 2014 Shingles 2012 PNA 2012 Tetanus 2014 Colonoscopy 2011 Breast cancer screening- mammogram 12/2013 DXA 2014 Advance directive d/w pt. Husband designated first, then daughter Manuela Schwartz, if patient is incapacitated.   Cognitive function addressed- see scanned forms- and if abnormal then additional documentation follows.   She has some occ clear anal leakage. It doesn't happen daily.  This is improved with probiotics. No blood in stool.  Prev colonoscopy noted. Not interested in GI referral at this point.    She has a sensation of "bladder dropping" and is s/p uro procedure prev.  She was asking about pessary.  I would have her talk to urology.  She agreed. She doesn't have urinary sx (yet, discussed).   Diabetes:  Using medications without difficulties:yes Hypoglycemic episodes:no Hyperglycemic episodes:no Feet problems:no Blood Sugars averaging: ~100 eye exam within last year: f/u pending  Hypertension:    Using medication  without problems or lightheadedness: yes Chest pain with exertion:no Edema:no Short of breath:no  Elevated Cholesterol: Using medications without problems:yes Muscle aches: generally no, occ back aches that don't seem to be related.  Diet compliance:discussed Exercise:discussed  PMH and SH reviewed.   Vital signs, Meds and allergies reviewed.  ROS: See HPI.  Otherwise nontributory.   GEN: nad, alert and oriented HEENT: mucous membranes moist NECK: supple w/o LA CV: rrr. PULM: ctab, no inc wob ABD: soft, +bs EXT: no edema SKIN: no acute rash  Diabetic foot exam: Normal inspection No skin breakdown No calluses  Normal DP pulses Normal sensation to light tough and monofilament Nails normal

## 2014-01-13 NOTE — Assessment & Plan Note (Signed)
No change in meds. She'll work on diet and continue as is o/w.  She agrees. Recheck in 6 months.  Labs ahead of time.

## 2014-01-13 NOTE — Assessment & Plan Note (Signed)
She'll call about uro f/u when needed.

## 2014-01-13 NOTE — Assessment & Plan Note (Signed)
Controlled, continue as is.  Labs d/w pt.  

## 2014-01-13 NOTE — Assessment & Plan Note (Signed)
See scanned forms.  Routine anticipatory guidance given to patient.  See health maintenance. Flu 2014 Shingles 2012 PNA 2012 Tetanus 2014 Colonoscopy 2011 Breast cancer screening- mammogram 12/2013 DXA 2014 Advance directive d/w pt. Husband designated first, then daughter Manuela Schwartz, if patient is incapacitated.   Cognitive function addressed- see scanned forms- and if abnormal then additional documentation follows.

## 2014-01-13 NOTE — Assessment & Plan Note (Signed)
Controlled, continue as is.  No change in meds. Continue to work on diet and exercise.

## 2014-01-15 ENCOUNTER — Telehealth: Payer: Self-pay

## 2014-01-15 NOTE — Telephone Encounter (Signed)
Relevant patient education assigned to patient using Emmi. ° °

## 2014-01-28 ENCOUNTER — Other Ambulatory Visit: Payer: Self-pay | Admitting: Family Medicine

## 2014-02-04 ENCOUNTER — Other Ambulatory Visit: Payer: Self-pay | Admitting: Family Medicine

## 2014-02-05 LAB — HM DIABETES EYE EXAM

## 2014-03-06 ENCOUNTER — Other Ambulatory Visit: Payer: Self-pay | Admitting: Family Medicine

## 2014-04-15 ENCOUNTER — Other Ambulatory Visit: Payer: Self-pay | Admitting: Family Medicine

## 2014-06-23 ENCOUNTER — Emergency Department: Payer: Self-pay | Admitting: Emergency Medicine

## 2014-06-23 LAB — BASIC METABOLIC PANEL
Anion Gap: 8 (ref 7–16)
BUN: 16 mg/dL (ref 7–18)
Calcium, Total: 10.4 mg/dL — ABNORMAL HIGH (ref 8.5–10.1)
Chloride: 104 mmol/L (ref 98–107)
Co2: 27 mmol/L (ref 21–32)
Creatinine: 0.52 mg/dL — ABNORMAL LOW (ref 0.60–1.30)
EGFR (African American): >60
EGFR (Non-African Amer.): >60
Glucose: 160 mg/dL — ABNORMAL HIGH (ref 65–99)
Osmolality: 282 (ref 275–301)
Potassium: 4 mmol/L (ref 3.5–5.1)
Sodium: 139 mmol/L (ref 136–145)

## 2014-06-23 LAB — CBC
HCT: 43 % (ref 35.0–47.0)
HGB: 14.2 g/dL (ref 12.0–16.0)
MCH: 30.1 pg (ref 26.0–34.0)
MCHC: 33 g/dL (ref 32.0–36.0)
MCV: 91 fL (ref 80–100)
Platelet: 199 10*3/uL (ref 150–440)
RBC: 4.72 10*6/uL (ref 3.80–5.20)
RDW: 14 % (ref 11.5–14.5)
WBC: 5.2 10*3/uL (ref 3.6–11.0)

## 2014-06-23 LAB — TROPONIN I
Troponin-I: <0.02 ng/mL
Troponin-I: <0.02 ng/mL

## 2014-06-24 ENCOUNTER — Ambulatory Visit (INDEPENDENT_AMBULATORY_CARE_PROVIDER_SITE_OTHER): Payer: Medicare Other | Admitting: Family Medicine

## 2014-06-24 ENCOUNTER — Encounter: Payer: Self-pay | Admitting: Family Medicine

## 2014-06-24 VITALS — BP 122/74 | HR 81 | Temp 98.3°F | Wt 161.2 lb

## 2014-06-24 DIAGNOSIS — E119 Type 2 diabetes mellitus without complications: Secondary | ICD-10-CM

## 2014-06-24 DIAGNOSIS — R0789 Other chest pain: Secondary | ICD-10-CM

## 2014-06-24 LAB — HEMOGLOBIN A1C: Hgb A1c MFr Bld: 7.6 % — ABNORMAL HIGH (ref 4.6–6.5)

## 2014-06-24 MED ORDER — PANTOPRAZOLE SODIUM 20 MG PO TBEC: 20.0000 mg | DELAYED_RELEASE_TABLET | Freq: Every day | ORAL | Status: DC

## 2014-06-24 NOTE — Progress Notes (Signed)
Pre visit review using our clinic review tool, if applicable. No additional management support is needed unless otherwise documented below in the visit note.  She was doing well until yesterday.  She is a side sleeper and occ has some pain after getting out of bed.  Yesterday wasn't atypical, some L shoulder blade pain in the AM.  It resolved.  She walked 1.5 miles yesterday AM. Then had more L shoulder blade pain after eating lunch.  Stretching didn't help and then she had anterior chest pain, aching.  She thought she had indigestion.  Took a pepcid and then went out to run some errands.  Then pain got progressively worse and then she went to ER.  Some nausea before getting to the ER.  She didn't vomit.  By the time she got to the ER, pain was easing off some, not resolved.  She had initial labs done, the pain was waxing and waning at that point.  Getting up and walking didn't help but resting after the walk did help.  She paced for long time, intermittently.  Her initial labs were fine.  She had an u/s done in ER, no AAA per patient report.  Second round of labs were also negative.  Given rx for PPI and discharged home. Now on PPI.    GI cocktail at the ER did help some at the ER.  She was uncomfortable this AM after waking after 5AM.  She went back to sleep on the sofa. Still with some tightness at the sternum this AM, better than yesterday, resolved now.  Took first dose of PPI today.   She walked some today w/o any sx.    Not SOB. H/o DM2, A1c <8.  Lipids prev controlled.   Meds, vitals, and allergies reviewed.   ROS: See HPI.  Otherwise, noncontributory.  GEN: nad, alert and oriented HEENT: mucous membranes moist NECK: supple w/o LA CV: rrr.  Murmur at baseline.  PULM: ctab, no inc wob ABD: soft, +bs EXT: no edema SKIN: no acute rash

## 2014-06-24 NOTE — Patient Instructions (Addendum)
Go to the lab on the way out.  We'll contact you with your lab report. Cancel the upcoming lab appointment but keep the follow up visit, at least for now.  Let me get a copy of the EKG from the ER and then we'll go from there.  We may need to get you to see the cardiac clinic, but I need to see your EKG first.  Ease back into exercise and continue the pantoprazole for now.  Take care.

## 2014-06-25 DIAGNOSIS — R0789 Other chest pain: Secondary | ICD-10-CM | POA: Insufficient documentation

## 2014-06-25 NOTE — Assessment & Plan Note (Signed)
Likely GERD, ER records reviewed (neg trop x2) but will need repeat fax of EKG from ER due to lack of clarity on first fax.   If totally normal EKG, then likely no other intervention needed.  If abnormal, we can set up cards eval.  Continue PPI for now.  She agrees. Okay for outpatient f/u.  >25 minutes spent in face to face time with patient, >50% spent in counselling or coordination of care.

## 2014-07-01 ENCOUNTER — Other Ambulatory Visit: Payer: Self-pay | Admitting: Family Medicine

## 2014-07-01 ENCOUNTER — Telehealth: Payer: Self-pay | Admitting: Family Medicine

## 2014-07-01 NOTE — Telephone Encounter (Signed)
Patient advised.   She says she feels fine and is not worried so if you think everything is fine, she is okay with not pursuing cards eval.

## 2014-07-01 NOTE — Telephone Encounter (Signed)
Please call pt.  Reviewed her EKG from the ER and compared to prev.  No sig change from prev.   Good news.  It is reasonable not to go to cards now if she has no other symptoms.  If she is worried and wants to go, or if she has other sx, then we should send her.  It is okay to skip cards eval o/w.   Let me know if I need to put in a referral.  Thanks.

## 2014-07-01 NOTE — Telephone Encounter (Signed)
I'll defer for now.  Thanks.

## 2014-07-10 ENCOUNTER — Telehealth: Payer: Self-pay

## 2014-07-10 ENCOUNTER — Ambulatory Visit: Payer: Medicare Other

## 2014-07-10 NOTE — Telephone Encounter (Signed)
Noted, thanks, chart updated.

## 2014-07-10 NOTE — Telephone Encounter (Signed)
Per [facility], pt will need referral for diabetic eye exam on file for 2015 in order to satisfy Healthcare Quality. Thanks

## 2014-07-10 NOTE — Telephone Encounter (Signed)
Spoke to patient and was advised that she had an eye exam in February. Patient stated that her insurance company paid for it so they should know this. Patient stated that she is going to call her insurance company and let them know.

## 2014-07-10 NOTE — Telephone Encounter (Signed)
Please call pt.  See if she needs a referral, if she hasn't had an eye exam in the last year. Let me know, if referral is needed.

## 2014-07-16 ENCOUNTER — Ambulatory Visit: Payer: Medicare Other | Admitting: Family Medicine

## 2014-08-09 ENCOUNTER — Other Ambulatory Visit: Payer: Self-pay | Admitting: Family Medicine

## 2014-10-10 ENCOUNTER — Telehealth: Payer: Self-pay | Admitting: Family Medicine

## 2014-10-10 NOTE — Telephone Encounter (Signed)
Patient called stating she received a phone call from [place] and she was not sure what it was regarding. I spoke w/Kisha and she states it was regarding the patient's diabetic eye exam. I attempted to call patient back, but did not get an answer. Need to know if she has an ophthalmologist and when her last exam was.

## 2014-10-26 ENCOUNTER — Telehealth: Payer: Self-pay

## 2014-10-26 NOTE — Telephone Encounter (Signed)
Spoke to pt and pt stated that an eye exam was done at [facility] on 15/501 by Dr. Normand Sloop

## 2014-10-28 ENCOUNTER — Telehealth: Payer: Self-pay

## 2014-10-28 ENCOUNTER — Other Ambulatory Visit: Payer: Self-pay | Admitting: *Deleted

## 2014-10-28 MED ORDER — ACCU-CHEK FASTCLIX LANCETS MISC: Status: AC

## 2014-10-28 MED ORDER — ACCU-CHEK AVIVA PLUS W/DEVICE KIT: PACK | Status: AC

## 2014-10-28 MED ORDER — GLUCOSE BLOOD VI STRP: ORAL_STRIP | Status: DC

## 2014-10-28 NOTE — Telephone Encounter (Signed)
Please call in meter and strips/lancets.  Check up to 4 times a day.  Dx Dm2 with insulin use, h/o elevated A1c/uncontrolled DM2.   Thanks.

## 2014-10-28 NOTE — Telephone Encounter (Signed)
Pt left v/m; ins will no longer cover present diabetic meter; pt request new meter, test strips and lancets; accu check aviva plus is covered under ins. [facility]. Pt said present test strips instructions are to check 4 x a day as needed.Please advise.

## 2014-11-14 ENCOUNTER — Other Ambulatory Visit: Payer: Self-pay | Admitting: Family Medicine

## 2014-12-02 ENCOUNTER — Encounter: Payer: Self-pay | Admitting: Family Medicine

## 2014-12-04 ENCOUNTER — Telehealth: Payer: Self-pay | Admitting: Family Medicine

## 2014-12-04 DIAGNOSIS — E2839 Other primary ovarian failure: Secondary | ICD-10-CM

## 2014-12-04 NOTE — Telephone Encounter (Signed)
Patient notified by telephone and verbalized understanding. 

## 2014-12-04 NOTE — Telephone Encounter (Signed)
Pt called stating she made her mammogram and bone density appointments she needs a order faxed for her bone density  Both appointment are for 01/01/15 @ [place]  Phone [phone] Fax [phone]

## 2014-12-04 NOTE — Telephone Encounter (Signed)
Order done.  Thanks.  Rosaria Ferries will work on this.

## 2015-01-02 ENCOUNTER — Encounter: Payer: Self-pay | Admitting: *Deleted

## 2015-01-02 ENCOUNTER — Encounter: Payer: Self-pay | Admitting: Family Medicine

## 2015-01-02 ENCOUNTER — Other Ambulatory Visit: Payer: Self-pay | Admitting: Family Medicine

## 2015-01-02 DIAGNOSIS — E119 Type 2 diabetes mellitus without complications: Secondary | ICD-10-CM

## 2015-01-06 ENCOUNTER — Other Ambulatory Visit (INDEPENDENT_AMBULATORY_CARE_PROVIDER_SITE_OTHER): Payer: Medicare Other

## 2015-01-06 DIAGNOSIS — E119 Type 2 diabetes mellitus without complications: Secondary | ICD-10-CM

## 2015-01-06 LAB — LIPID PANEL
Cholesterol: 135 mg/dL (ref 0–200)
HDL: 30.9 mg/dL — ABNORMAL LOW (ref >39.00)
LDL Cholesterol: 87 mg/dL (ref 0–99)
NonHDL: 104.1
Total CHOL/HDL Ratio: 4
Triglycerides: 86 mg/dL (ref 0.0–149.0)
VLDL: 17.2 mg/dL (ref 0.0–40.0)

## 2015-01-06 LAB — COMPREHENSIVE METABOLIC PANEL
ALT: 18 U/L (ref 0–35)
AST: 15 U/L (ref 0–37)
Albumin: 4 g/dL (ref 3.5–5.2)
Alkaline Phosphatase: 104 U/L (ref 39–117)
BUN: 21 mg/dL (ref 6–23)
CO2: 28 mEq/L (ref 19–32)
Calcium: 10.5 mg/dL (ref 8.4–10.5)
Chloride: 104 mEq/L (ref 96–112)
Creatinine, Ser: 0.52 mg/dL (ref 0.40–1.20)
GFR: 124.16 mL/min (ref >60.00)
Glucose, Bld: 136 mg/dL — ABNORMAL HIGH (ref 70–99)
Potassium: 4.4 mEq/L (ref 3.5–5.1)
Sodium: 139 mEq/L (ref 135–145)
Total Bilirubin: 1.7 mg/dL — ABNORMAL HIGH (ref 0.2–1.2)
Total Protein: 6.9 g/dL (ref 6.0–8.3)

## 2015-01-06 LAB — HEMOGLOBIN A1C: Hgb A1c MFr Bld: 7.8 % — ABNORMAL HIGH (ref 4.6–6.5)

## 2015-01-07 ENCOUNTER — Encounter: Payer: Self-pay | Admitting: Family Medicine

## 2015-01-13 ENCOUNTER — Encounter: Payer: Self-pay | Admitting: Family Medicine

## 2015-01-13 ENCOUNTER — Ambulatory Visit (INDEPENDENT_AMBULATORY_CARE_PROVIDER_SITE_OTHER): Payer: Medicare Other | Admitting: Family Medicine

## 2015-01-13 VITALS — BP 122/72 | HR 73 | Temp 98.0°F | Ht 65.5 in | Wt 162.5 lb

## 2015-01-13 DIAGNOSIS — Z23 Encounter for immunization: Secondary | ICD-10-CM | POA: Diagnosis not present

## 2015-01-13 DIAGNOSIS — K649 Unspecified hemorrhoids: Secondary | ICD-10-CM

## 2015-01-13 DIAGNOSIS — I10 Essential (primary) hypertension: Secondary | ICD-10-CM | POA: Diagnosis not present

## 2015-01-13 DIAGNOSIS — E7800 Pure hypercholesterolemia, unspecified: Secondary | ICD-10-CM

## 2015-01-13 DIAGNOSIS — Z0000 Encounter for general adult medical examination without abnormal findings: Secondary | ICD-10-CM

## 2015-01-13 DIAGNOSIS — E119 Type 2 diabetes mellitus without complications: Secondary | ICD-10-CM

## 2015-01-13 DIAGNOSIS — IMO0002 Reserved for concepts with insufficient information to code with codable children: Secondary | ICD-10-CM

## 2015-01-13 DIAGNOSIS — Z7189 Other specified counseling: Secondary | ICD-10-CM

## 2015-01-13 MED ORDER — METFORMIN HCL 1000 MG PO TABS: ORAL_TABLET | ORAL | Status: DC

## 2015-01-13 MED ORDER — HYDROCHLOROTHIAZIDE 25 MG PO TABS: 25.0000 mg | ORAL_TABLET | Freq: Every day | ORAL | Status: DC

## 2015-01-13 MED ORDER — INSULIN DETEMIR 100 UNIT/ML ~~LOC~~ SOLN: 90.0000 [IU] | Freq: Every day | SUBCUTANEOUS | Status: DC

## 2015-01-13 MED ORDER — QUINAPRIL HCL 20 MG PO TABS: 20.0000 mg | ORAL_TABLET | Freq: Two times a day (BID) | ORAL | Status: DC

## 2015-01-13 MED ORDER — INSULIN PEN NEEDLE 31G X 8 MM MISC: Status: DC

## 2015-01-13 MED ORDER — ATORVASTATIN CALCIUM 10 MG PO TABS: 10.0000 mg | ORAL_TABLET | Freq: Every day | ORAL | Status: DC

## 2015-01-13 NOTE — Progress Notes (Signed)
Pre visit review using our clinic review tool, if applicable. No additional management support is needed unless otherwise documented below in the visit note.  I have personally reviewed the Medicare Annual Wellness questionnaire and have noted 1. The patient's medical and social history 2. Their use of alcohol, tobacco or illicit drugs 3. Their current medications and supplements 4. The patient's functional ability including ADL's, fall risks, home safety risks and hearing or visual             impairment. 5. Diet and physical activities 6. Evidence for depression or mood disorders  The patients weight, height, BMI have been recorded in the chart and visual acuity is per eye clinic.  I have made referrals, counseling and provided education to the patient based review of the above and I have provided the pt with a written personalized care plan for preventive services.  Provider list updated- see scanned forms.  Routine anticipatory guidance given to patient.  See health maintenance.  Flu 2015 Shingles 2012 PNA 2012 Tetanus 2014 Colon2011 Breast cancer screening up to date DXA up to date. Advance directive- husband designated if patient were incapacitated.  Cognitive function addressed- see scanned forms- and if abnormal then additional documentation follows.   H/o bladder tack and wanted to ask about a pessary.  Had seen [facility] prev at [facility].  Defer for now per patient.    External hemorrhoids.  She had had to wear a pad due to anal leakage.  Asking about options.  No pain from hemorrhoids usually.  She was asking about treatment, ie seeing GI again.  I asked if she wanted to wait until she was due for f/u colonoscopy later this year.  She'll consider and let me know if she wants to go sooner.    Diabetes:  Using medications without difficulties: yes Hypoglycemic episodes:no Hyperglycemic episodes:no Feet problems:no Blood Sugars averaging: ~100 eye exam within last year:  yes  Elevated Cholesterol: Using medications without problems: yes Muscle aches: no Diet compliance:yes Exercise:yes Labs d/w pt.  LDL <100, similar to prev.    Hypertension:    Using medication without problems or lightheadedness: yes Chest pain with exertion:no Edema:no Short of breath:no  PMH and SH reviewed  Meds, vitals, and allergies reviewed.   ROS: See HPI.  Otherwise negative.    GEN: nad, alert and oriented HEENT: mucous membranes moist NECK: supple w/o LA CV: rrr. PULM: ctab, no inc wob ABD: soft, +bs EXT: no edema SKIN: no acute rash  Diabetic foot exam: Normal inspection No skin breakdown No calluses  Normal DP pulses Normal sensation to light touch and monofilament Nails normal

## 2015-01-13 NOTE — Patient Instructions (Addendum)
Let me know when you want to go see GI (hemorrhoids) or urology (pessary). I would get a flu shot each fall.   Recheck A1c in about 6 months, before a visit.  Keep working on your diet and weight.   Glad to see you. Take care.

## 2015-01-14 DIAGNOSIS — K649 Unspecified hemorrhoids: Secondary | ICD-10-CM | POA: Insufficient documentation

## 2015-01-14 NOTE — Assessment & Plan Note (Signed)
She'll let us know when she wants to go to GI, if before her colonoscopy.

## 2015-01-14 NOTE — Assessment & Plan Note (Signed)
Flu 2015 Shingles 2012 PNA 2012 Tetanus 2014 Colon2011 Breast cancer screening up to date DXA up to date. Advance directive- husband designated if patient were incapacitated.  Cognitive function addressed- see scanned forms- and if abnormal then additional documentation follows.

## 2015-01-14 NOTE — Assessment & Plan Note (Signed)
We can refer to uro when she wants to go.  D/w pt at OV.  She agrees.

## 2015-01-14 NOTE — Assessment & Plan Note (Signed)
Controlled, continue current meds. Labs d/w pt.  

## 2015-01-14 NOTE — Assessment & Plan Note (Signed)
Above routine goal (ie >7) but would like to avoid lows and she is going to work on diet and exercise.  Wouldn't change meds at this point.  A1c trend d/w pt.  Would recheck again in 2016, d/w pt.  She agrees.

## 2015-01-15 ENCOUNTER — Telehealth: Payer: Self-pay | Admitting: Family Medicine

## 2015-01-15 NOTE — Telephone Encounter (Signed)
emmi emailed °

## 2015-02-27 LAB — HM DIABETES EYE EXAM

## 2015-07-09 ENCOUNTER — Ambulatory Visit (INDEPENDENT_AMBULATORY_CARE_PROVIDER_SITE_OTHER)
Admission: RE | Admit: 2015-07-09 | Discharge: 2015-07-09 | Disposition: A | Discharge status: Home / Self Care | Payer: Medicare Other | Source: Ambulatory Visit | Attending: Family Medicine | Admitting: Family Medicine

## 2015-07-09 ENCOUNTER — Encounter: Payer: Self-pay | Admitting: Family Medicine

## 2015-07-09 ENCOUNTER — Ambulatory Visit (INDEPENDENT_AMBULATORY_CARE_PROVIDER_SITE_OTHER): Payer: Medicare Other | Admitting: Family Medicine

## 2015-07-09 VITALS — BP 108/64 | HR 86 | Temp 99.0°F | Wt 157.5 lb

## 2015-07-09 DIAGNOSIS — R1011 Right upper quadrant pain: Secondary | ICD-10-CM | POA: Diagnosis not present

## 2015-07-09 DIAGNOSIS — J189 Pneumonia, unspecified organism: Secondary | ICD-10-CM | POA: Insufficient documentation

## 2015-07-09 NOTE — Assessment & Plan Note (Signed)
Unclear source, we may need to get u/s if cxr and labs unremarkable.  She has hx suggestive of GB pathology but benign exam.  Not SOB at exam, nontoxic.  Still okay for outpatient f/u.  She agrees with plan.  If worsening, to ER.  She agrees.

## 2015-07-09 NOTE — Progress Notes (Signed)
Pre visit review using our clinic review tool, if applicable. No additional management support is needed unless otherwise documented below in the visit note.  She was in [place] last week.  Had a picnic dinner and the woke up with indigestion.  Resolved with drinking water.  Similar sx the next night, she attributed that again to recently eaten food.  She got up and walked around and that helped some.  Next AM she got some alkaseltzer, took that and made the trip home.   Got back to [place] and went to bed.  The next day she felt diffusely achy.  That resolved except for R upper abd pain, from midline radiating around the R side to the back.  Continued to have pain under R ribs.  Pain with a laugh, cough, sneeze, deep breath.  She can lay on the R side at night w/o troubles but laying on L side causes more pain.    This doesn't feel like prev renal stones.  No blood in urine.  No FCNAVD.   No pain now when sitting still.  She is better now than prev.   She is avoiding fatty foods on principle recently.   No blood in stool.  No black stools.  She has irregular BMs at baseline.    Meds, vitals, and allergies reviewed.   ROS: See HPI.  Otherwise, noncontributory.  GEN: nad, alert and oriented HEENT: mucous membranes moist NECK: supple w/o LA CV: rrr.  PULM: ctab, no inc wob, no wheeze.   ABD: soft, +bs, abd not ttp, RUQ not ttp (she can have pain in the RUQ but not on exam with palpation) EXT: no edema SKIN: no acute rash

## 2015-07-09 NOTE — Patient Instructions (Signed)
Go to the lab on the way out.  We'll contact you with your xray and lab report. We may need to get an ultrasound done.   Avoid fatty foods, alcohol, and large meals for now.  If this gets dramatically worse, then go to the ER.   Take care.  Glad to see you.

## 2015-07-10 ENCOUNTER — Other Ambulatory Visit: Payer: Self-pay | Admitting: Family Medicine

## 2015-07-10 LAB — CBC WITH DIFFERENTIAL/PLATELET
Basophils Absolute: 0 10*3/uL (ref 0.0–0.1)
Basophils Relative: 0.5 % (ref 0.0–3.0)
Eosinophils Absolute: 0.1 10*3/uL (ref 0.0–0.7)
Eosinophils Relative: 1.3 % (ref 0.0–5.0)
HCT: 41.4 % (ref 36.0–46.0)
Hemoglobin: 13.9 g/dL (ref 12.0–15.0)
Lymphocytes Relative: 21.7 % (ref 12.0–46.0)
Lymphs Abs: 1.5 10*3/uL (ref 0.7–4.0)
MCHC: 33.6 g/dL (ref 30.0–36.0)
MCV: 89.5 fl (ref 78.0–100.0)
Monocytes Absolute: 0.8 10*3/uL (ref 0.1–1.0)
Monocytes Relative: 11.4 % (ref 3.0–12.0)
Neutro Abs: 4.4 10*3/uL (ref 1.4–7.7)
Neutrophils Relative %: 65.1 % (ref 43.0–77.0)
Platelets: 268 10*3/uL (ref 150.0–400.0)
RBC: 4.63 Mil/uL (ref 3.87–5.11)
RDW: 13.7 % (ref 11.5–15.5)
WBC: 6.7 10*3/uL (ref 4.0–10.5)

## 2015-07-10 LAB — COMPREHENSIVE METABOLIC PANEL
ALT: 16 U/L (ref 0–35)
AST: 17 U/L (ref 0–37)
Albumin: 3.7 g/dL (ref 3.5–5.2)
Alkaline Phosphatase: 80 U/L (ref 39–117)
BUN: 15 mg/dL (ref 6–23)
CO2: 27 mEq/L (ref 19–32)
Calcium: 10.6 mg/dL — ABNORMAL HIGH (ref 8.4–10.5)
Chloride: 104 mEq/L (ref 96–112)
Creatinine, Ser: 0.57 mg/dL (ref 0.40–1.20)
GFR: 111.51 mL/min (ref >60.00)
Glucose, Bld: 119 mg/dL — ABNORMAL HIGH (ref 70–99)
Potassium: 4.2 mEq/L (ref 3.5–5.1)
Sodium: 140 mEq/L (ref 135–145)
Total Bilirubin: 1.3 mg/dL — ABNORMAL HIGH (ref 0.2–1.2)
Total Protein: 6.8 g/dL (ref 6.0–8.3)

## 2015-07-10 LAB — BRAIN NATRIURETIC PEPTIDE: Pro B Natriuretic peptide (BNP): 14 pg/mL (ref 0.0–100.0)

## 2015-07-10 LAB — LIPASE: Lipase: 26 U/L (ref 11.0–59.0)

## 2015-07-10 MED ORDER — DOXYCYCLINE HYCLATE 100 MG PO TABS: 100.0000 mg | ORAL_TABLET | Freq: Two times a day (BID) | ORAL | Status: DC

## 2015-07-10 MED ORDER — ALBUTEROL SULFATE HFA 108 (90 BASE) MCG/ACT IN AERS: 1.0000 | INHALATION_SPRAY | Freq: Four times a day (QID) | RESPIRATORY_TRACT | Status: DC | PRN

## 2015-07-10 NOTE — Addendum Note (Signed)
Addended by: Ellamae Sia on: 07/10/2015 09:14 AM   Modules accepted: Orders

## 2015-07-11 ENCOUNTER — Encounter: Payer: Self-pay | Admitting: Family Medicine

## 2015-07-11 ENCOUNTER — Ambulatory Visit (INDEPENDENT_AMBULATORY_CARE_PROVIDER_SITE_OTHER): Payer: Medicare Other | Admitting: Family Medicine

## 2015-07-11 VITALS — BP 110/70 | HR 79 | Temp 98.1°F | Wt 157.0 lb

## 2015-07-11 DIAGNOSIS — J189 Pneumonia, unspecified organism: Secondary | ICD-10-CM

## 2015-07-11 DIAGNOSIS — Z119 Encounter for screening for infectious and parasitic diseases, unspecified: Secondary | ICD-10-CM | POA: Diagnosis not present

## 2015-07-11 NOTE — Patient Instructions (Addendum)
Plan on a recheck chest xray in about 6 weeks.   Recheck labs next week before the next office visit.  Take care.  Glad to see you.

## 2015-07-11 NOTE — Progress Notes (Signed)
Pre visit review using our clinic review tool, if applicable. No additional management support is needed unless otherwise documented below in the visit note.  PNA f/u.  Hasn't had to use SABA, is better.  Breathing is better, but is still fatigued.  On doxycycline.  Rare sputum, think and brownish/tan this AM.  That sputum production was only early this AM.  Some cough, mainly dry.  No FCNAVD.  Easier to take a deep breath.  Less R rib/chest wall pain.    Imaging d/w pt.  Reviewed with imaging with patient at the [facility].    Pt opts in for HCV screening with next set of labs.  D/w pt re: routine screening.    Meds, vitals, and allergies reviewed.   ROS: See HPI.  Otherwise, noncontributory.  GEN: nad, alert and oriented, she appears to be globally more well appearing than at the last OV.  HEENT: mucous membranes moist NECK: supple w/o LA CV: rrr.  PULM: ctab, no inc wob, no wheeze (but she can more easily take a deep breath per her report) ABD: soft, +bs EXT: no edema SKIN: no acute rash

## 2015-07-14 NOTE — Assessment & Plan Note (Signed)
Likely caused the prev sx.  Clearly improved, BNP wnl.  Okay to continue as is with current meds.  She agrees.  Imaging d/w pt.  Plan on a recheck chest xray in about 6 weeks.   Update me as needed, especially if worsening.   Okay for outpatient f/u.

## 2015-07-15 ENCOUNTER — Other Ambulatory Visit: Payer: Self-pay | Admitting: Family Medicine

## 2015-07-15 ENCOUNTER — Other Ambulatory Visit (INDEPENDENT_AMBULATORY_CARE_PROVIDER_SITE_OTHER): Payer: Medicare Other

## 2015-07-15 DIAGNOSIS — E119 Type 2 diabetes mellitus without complications: Secondary | ICD-10-CM | POA: Diagnosis not present

## 2015-07-15 DIAGNOSIS — E2839 Other primary ovarian failure: Secondary | ICD-10-CM

## 2015-07-15 DIAGNOSIS — Z119 Encounter for screening for infectious and parasitic diseases, unspecified: Secondary | ICD-10-CM

## 2015-07-15 LAB — HEMOGLOBIN A1C: Hgb A1c MFr Bld: 7.2 % — ABNORMAL HIGH (ref 4.6–6.5)

## 2015-07-15 LAB — TSH: TSH: 0.71 u[IU]/mL (ref 0.35–4.50)

## 2015-07-16 LAB — HEPATITIS C ANTIBODY: HCV Ab: NEGATIVE

## 2015-07-18 ENCOUNTER — Ambulatory Visit (INDEPENDENT_AMBULATORY_CARE_PROVIDER_SITE_OTHER): Payer: Medicare Other | Admitting: Family Medicine

## 2015-07-18 ENCOUNTER — Encounter: Payer: Self-pay | Admitting: Family Medicine

## 2015-07-18 VITALS — BP 124/70 | HR 76 | Temp 98.4°F | Ht 65.5 in | Wt 158.0 lb

## 2015-07-18 DIAGNOSIS — E119 Type 2 diabetes mellitus without complications: Secondary | ICD-10-CM

## 2015-07-18 DIAGNOSIS — J189 Pneumonia, unspecified organism: Secondary | ICD-10-CM | POA: Diagnosis not present

## 2015-07-18 DIAGNOSIS — K649 Unspecified hemorrhoids: Secondary | ICD-10-CM | POA: Diagnosis not present

## 2015-07-18 NOTE — Progress Notes (Signed)
Pre visit review using our clinic review tool, if applicable. No additional management support is needed unless otherwise documented below in the visit note.  PNA clinically improved.  Much better breathing, can take a deeper breath.  Almost done with abx.  Was able to work out/exercise recently.    Diabetes:  Using medications without difficulties:yes Hypoglycemic episodes: usually no, but did have a few lows with recent PNA, at night.  She usually takes a snack at night before bed.  Hyperglycemic episodes: no Feet problems: no tingling but occ itchy feeling.   Blood Sugars averaging: usually ~100 eye exam within last year: yes, done march 2016.  Labs d/w pt.  A1c improved.  TSH wnl. D/w pt.    She wanted to see Dr. Johnsie Kindred Thacker about her hemorrhoids.  Referral placed.    Meds, vitals, and allergies reviewed.   ROS: See HPI.  Otherwise negative.    GEN: nad, alert and oriented HEENT: mucous membranes moist NECK: supple w/o LA CV: rrr. PULM: ctab, no inc wob ABD: soft, +bs EXT: no edema SKIN: no acute rash  Diabetic foot exam: Normal inspection No skin breakdown No calluses  Normal DP pulses Normal sensation to light touch and monofilament Nails normal

## 2015-07-18 NOTE — Assessment & Plan Note (Signed)
She can cut her insulin dose if she has more lows.  This may have been exacerbated by recent illness.  She agrees.   A1c okay for now.   Recheck in about 6 months at [facility], sooner if needed.

## 2015-07-18 NOTE — Assessment & Plan Note (Signed)
Refer

## 2015-07-18 NOTE — Patient Instructions (Addendum)
If needed, cut back on your insulin dose (if more lows). I would get a flu shot each fall when you are off the antibiotics.  Xray later this fall.   Rosaria Ferries will call about your referral. Recheck at a physical, labs ahead of time, in about 6 months.   Take care.  Glad to see you.

## 2015-07-18 NOTE — Assessment & Plan Note (Signed)
Clinically improved, finish abx and recheck CXR later this fall.  She agrees.

## 2015-08-22 ENCOUNTER — Ambulatory Visit (INDEPENDENT_AMBULATORY_CARE_PROVIDER_SITE_OTHER)
Admission: RE | Admit: 2015-08-22 | Discharge: 2015-08-22 | Disposition: A | Discharge status: Home / Self Care | Payer: Medicare Other | Source: Ambulatory Visit | Attending: Family Medicine | Admitting: Family Medicine

## 2015-08-22 DIAGNOSIS — J189 Pneumonia, unspecified organism: Secondary | ICD-10-CM | POA: Diagnosis not present

## 2015-09-12 ENCOUNTER — Encounter: Payer: Self-pay | Admitting: Gastroenterology

## 2015-10-13 ENCOUNTER — Telehealth: Payer: Self-pay

## 2015-10-13 NOTE — Telephone Encounter (Signed)
I would skip metformin the night before and the AM of the procedure Would take half of insulin dose the night before the procedure.   Resume regular dosing of both when back to eating regularly.   Thanks.

## 2015-10-13 NOTE — Telephone Encounter (Signed)
Pt left v/m; pt has colonoscopy appt at [facility] with doctor pt has previously discussed with Dr Damita Dunnings. The [facility] doctor advised pt to contact Dr Damita Dunnings how to take insulin and diabetic meds(pt taking levemir and metformin) prior to colonoscopy. Pt request cb.

## 2015-10-13 NOTE — Telephone Encounter (Signed)
Patient notified as instructed by telephone and verbalized understanding. 

## 2015-10-28 LAB — HM COLONOSCOPY

## 2015-11-10 ENCOUNTER — Other Ambulatory Visit: Payer: Self-pay | Admitting: Family Medicine

## 2015-12-02 ENCOUNTER — Other Ambulatory Visit: Payer: Self-pay | Admitting: *Deleted

## 2015-12-02 MED ORDER — HYDROCHLOROTHIAZIDE 25 MG PO TABS: 25.0000 mg | ORAL_TABLET | Freq: Every day | ORAL | Status: DC

## 2016-01-04 ENCOUNTER — Other Ambulatory Visit: Payer: Self-pay | Admitting: Family Medicine

## 2016-01-04 DIAGNOSIS — E119 Type 2 diabetes mellitus without complications: Secondary | ICD-10-CM

## 2016-01-04 DIAGNOSIS — M949 Disorder of cartilage, unspecified: Secondary | ICD-10-CM

## 2016-01-04 DIAGNOSIS — M899 Disorder of bone, unspecified: Secondary | ICD-10-CM

## 2016-01-05 ENCOUNTER — Ambulatory Visit (INDEPENDENT_AMBULATORY_CARE_PROVIDER_SITE_OTHER): Payer: Medicare Other | Admitting: Family Medicine

## 2016-01-05 ENCOUNTER — Encounter: Payer: Self-pay | Admitting: Family Medicine

## 2016-01-05 VITALS — BP 122/62 | HR 89 | Temp 98.5°F | Wt 158.5 lb

## 2016-01-05 DIAGNOSIS — B370 Candidal stomatitis: Secondary | ICD-10-CM | POA: Diagnosis not present

## 2016-01-05 MED ORDER — INSULIN DETEMIR 100 UNIT/ML ~~LOC~~ SOLN: 80.0000 [IU] | Freq: Every day | SUBCUTANEOUS | Status: DC

## 2016-01-05 MED ORDER — NYSTATIN 100000 UNIT/ML MT SUSP: 5.0000 mL | Freq: Four times a day (QID) | OROMUCOSAL | Status: DC

## 2016-01-05 NOTE — Patient Instructions (Signed)
Use nystatin until better and then for 2 more days.  Take care.  Glad to see you.

## 2016-01-05 NOTE — Assessment & Plan Note (Signed)
Nystatin until resolved and then for 2 more days.  D/w pt.  F/u prn.  Sugar has been controlled per patient report.  Cold sx resolving.

## 2016-01-05 NOTE — Progress Notes (Signed)
Pre visit review using our clinic review tool, if applicable. No additional management support is needed unless otherwise documented below in the visit note.  ~2 weeks ago she had a cold, cough and sneezing.  Saturday AM, 2 days ago, her tongue didn't feel normal and she was seen at the [facility].  Was dx'd with thrush.  Had been coughing and using mucinex, cough drops.  All of that could have contributed.  Started on nystatin.  Her tongue is some better in the meantime.    She did have white patches that she could scrape off prev.    The cold sx are some better, less cough.  No fevers.    Meds, vitals, and allergies reviewed.   ROS: See HPI.  Otherwise, noncontributory.  GEN: nad, alert and oriented HEENT: mucous membranes moist, tm w/o erythema, nasal exam w/o erythema, OP with posterior irritation with scant fungal patches noted on the soft palate, similar changes on the tongue- both mild NECK: supple w/o LA CV: rrr.   PULM: ctab, no inc wob

## 2016-01-06 ENCOUNTER — Encounter: Payer: Self-pay | Admitting: Family Medicine

## 2016-01-07 ENCOUNTER — Encounter: Payer: Self-pay | Admitting: *Deleted

## 2016-01-08 ENCOUNTER — Encounter: Payer: Self-pay | Admitting: *Deleted

## 2016-01-08 ENCOUNTER — Other Ambulatory Visit: Payer: Self-pay | Admitting: *Deleted

## 2016-01-08 MED ORDER — ATORVASTATIN CALCIUM 10 MG PO TABS: 10.0000 mg | ORAL_TABLET | Freq: Every day | ORAL | Status: DC

## 2016-01-08 NOTE — Telephone Encounter (Signed)
Electronic refill request. Last Filled:    120 mL 0 01/05/2016  Please advise.

## 2016-01-08 NOTE — Telephone Encounter (Signed)
Left detailed message on voicemail.  

## 2016-01-08 NOTE — Telephone Encounter (Signed)
Call/clarify with pt.  Okay to refill if needed. Thanks.

## 2016-01-09 ENCOUNTER — Encounter: Payer: Self-pay | Admitting: Family Medicine

## 2016-01-12 ENCOUNTER — Other Ambulatory Visit (INDEPENDENT_AMBULATORY_CARE_PROVIDER_SITE_OTHER): Payer: Medicare Other

## 2016-01-12 DIAGNOSIS — M899 Disorder of bone, unspecified: Secondary | ICD-10-CM

## 2016-01-12 DIAGNOSIS — E119 Type 2 diabetes mellitus without complications: Secondary | ICD-10-CM | POA: Diagnosis not present

## 2016-01-12 DIAGNOSIS — M949 Disorder of cartilage, unspecified: Secondary | ICD-10-CM | POA: Diagnosis not present

## 2016-01-12 LAB — VITAMIN D 25 HYDROXY (VIT D DEFICIENCY, FRACTURES): VITD: 32.28 ng/mL (ref 30.00–100.00)

## 2016-01-12 LAB — COMPREHENSIVE METABOLIC PANEL
ALT: 17 U/L (ref 0–35)
AST: 16 U/L (ref 0–37)
Albumin: 3.9 g/dL (ref 3.5–5.2)
Alkaline Phosphatase: 99 U/L (ref 39–117)
BUN: 13 mg/dL (ref 6–23)
CO2: 31 mEq/L (ref 19–32)
Calcium: 10.2 mg/dL (ref 8.4–10.5)
Chloride: 103 mEq/L (ref 96–112)
Creatinine, Ser: 0.45 mg/dL (ref 0.40–1.20)
GFR: 146.27 mL/min (ref >60.00)
Glucose, Bld: 112 mg/dL — ABNORMAL HIGH (ref 70–99)
Potassium: 4.2 mEq/L (ref 3.5–5.1)
Sodium: 140 mEq/L (ref 135–145)
Total Bilirubin: 1.3 mg/dL — ABNORMAL HIGH (ref 0.2–1.2)
Total Protein: 6.6 g/dL (ref 6.0–8.3)

## 2016-01-12 LAB — LIPID PANEL
Cholesterol: 133 mg/dL (ref 0–200)
HDL: 30.4 mg/dL — ABNORMAL LOW (ref >39.00)
LDL Cholesterol: 84 mg/dL (ref 0–99)
NonHDL: 102.48
Total CHOL/HDL Ratio: 4
Triglycerides: 91 mg/dL (ref 0.0–149.0)
VLDL: 18.2 mg/dL (ref 0.0–40.0)

## 2016-01-12 LAB — HEMOGLOBIN A1C: Hgb A1c MFr Bld: 8 % — ABNORMAL HIGH (ref 4.6–6.5)

## 2016-01-15 ENCOUNTER — Ambulatory Visit (INDEPENDENT_AMBULATORY_CARE_PROVIDER_SITE_OTHER): Payer: Medicare Other | Admitting: Family Medicine

## 2016-01-15 ENCOUNTER — Encounter: Payer: Self-pay | Admitting: Family Medicine

## 2016-01-15 VITALS — BP 128/76 | HR 78 | Temp 98.6°F | Ht 66.0 in | Wt 159.5 lb

## 2016-01-15 DIAGNOSIS — E119 Type 2 diabetes mellitus without complications: Secondary | ICD-10-CM

## 2016-01-15 DIAGNOSIS — E7800 Pure hypercholesterolemia, unspecified: Secondary | ICD-10-CM

## 2016-01-15 DIAGNOSIS — I10 Essential (primary) hypertension: Secondary | ICD-10-CM

## 2016-01-15 DIAGNOSIS — Z0000 Encounter for general adult medical examination without abnormal findings: Secondary | ICD-10-CM | POA: Diagnosis not present

## 2016-01-15 DIAGNOSIS — R011 Cardiac murmur, unspecified: Secondary | ICD-10-CM

## 2016-01-15 MED ORDER — QUINAPRIL HCL 20 MG PO TABS: 20.0000 mg | ORAL_TABLET | Freq: Two times a day (BID) | ORAL | Status: DC

## 2016-01-15 NOTE — Patient Instructions (Signed)
Take care.  Glad to see you.  Keep working on diet and exercise.  Recheck in about 6 months, labs ahead of time.

## 2016-01-15 NOTE — Assessment & Plan Note (Signed)
Flu 2016 Shingles 2012 PNA  2016 Tetanus 2014 Colonoscopy d/w pt.  She'll call about f/u re: prev partial polyp removal.   Breast cancer screening up to date.  DXA done 2016 Advance directive - husband designated if patient were incapacitated.   Cognitive function addressed- see scanned forms- and if abnormal then additional documentation follows.

## 2016-01-15 NOTE — Assessment & Plan Note (Signed)
Controlled, continue as is.  

## 2016-01-15 NOTE — Assessment & Plan Note (Signed)
Controlled. Continue as is. 

## 2016-01-15 NOTE — Progress Notes (Signed)
Pre visit review using our clinic review tool, if applicable. No additional management support is needed unless otherwise documented below in the visit note.  I have personally reviewed the Medicare Annual Wellness questionnaire and have noted 1. The patient's medical and social history 2. Their use of alcohol, tobacco or illicit drugs 3. Their current medications and supplements 4. The patient's functional ability including ADL's, fall risks, home safety risks and hearing or visual             impairment. 5. Diet and physical activities 6. Evidence for depression or mood disorders  The patients weight, height, BMI have been recorded in the chart and visual acuity is per eye clinic.  I have made referrals, counseling and provided education to the patient based review of the above and I have provided the pt with a written personalized care plan for preventive services.  Provider list updated- see scanned forms.  Routine anticipatory guidance given to patient.  See health maintenance.  Flu 2016 Shingles 2012 PNA  2016 Tetanus 2014 Colonoscopy d/w pt.  She'll call about f/u re: prev partial polyp removal.   Breast cancer screening up to date.  DXA done 2016 Advance directive - husband designated if patient were incapacitated.   Cognitive function addressed- see scanned forms- and if abnormal then additional documentation follows.   Diabetes:  Using medications without difficulties:yes Hypoglycemic episodes:no Hyperglycemic episodes:no Feet problems:no Blood Sugars averaging: usually ~100 in the AM eye exam within last year: yes She isn't have lows now but her A1c is up some.  D/w pt.    Her thrush is resolved. Her mouth feels better.    Hypertension:    Using medication without problems or lightheadedness: yes Chest pain with exertion:no Edema:no Short of breath:no  Elevated Cholesterol: Using medications without problems:yes Muscle aches: no Diet  compliance:yes Exercise:yes  PMH and SH reviewed  ROS: See HPI, otherwise noncontributory.  Meds, vitals, and allergies reviewed.   GEN: nad, alert and oriented HEENT: mucous membranes moist NECK: supple w/o LA CV: rrr, soft SEM noted, no change from baseline PULM: ctab, no inc wob ABD: soft, +bs EXT: no edema SKIN: no acute rash  Diabetic foot exam: Normal inspection No skin breakdown No calluses  Normal DP pulses Normal sensation to light touch and monofilament Nails normal

## 2016-01-15 NOTE — Assessment & Plan Note (Signed)
Prev echo done, no change in exam, we can follow clinically.

## 2016-01-15 NOTE — Assessment & Plan Note (Signed)
Reasonable to have goal A1c <8, but not <7, given risk for hypoglycemia.  D/w pt.  She'll work on diet and exercise, recheck in about 6 months, sooner if needed.   No change in med at this point.

## 2016-02-10 ENCOUNTER — Other Ambulatory Visit: Payer: Self-pay | Admitting: *Deleted

## 2016-02-10 MED ORDER — METFORMIN HCL 1000 MG PO TABS: ORAL_TABLET | ORAL | Status: DC

## 2016-02-27 ENCOUNTER — Other Ambulatory Visit: Payer: Self-pay | Admitting: *Deleted

## 2016-02-27 MED ORDER — INSULIN PEN NEEDLE 31G X 8 MM MISC: Status: DC

## 2016-04-20 ENCOUNTER — Other Ambulatory Visit: Payer: Self-pay | Admitting: Family Medicine

## 2016-05-12 ENCOUNTER — Other Ambulatory Visit: Payer: Self-pay | Admitting: Family Medicine

## 2016-07-06 LAB — HM COLONOSCOPY

## 2016-07-09 ENCOUNTER — Other Ambulatory Visit: Payer: Self-pay | Admitting: Family Medicine

## 2016-07-09 DIAGNOSIS — E119 Type 2 diabetes mellitus without complications: Secondary | ICD-10-CM

## 2016-07-15 ENCOUNTER — Other Ambulatory Visit (INDEPENDENT_AMBULATORY_CARE_PROVIDER_SITE_OTHER): Payer: Medicare Other

## 2016-07-15 DIAGNOSIS — E119 Type 2 diabetes mellitus without complications: Secondary | ICD-10-CM

## 2016-07-15 LAB — HEMOGLOBIN A1C: Hgb A1c MFr Bld: 7.7 % — ABNORMAL HIGH (ref 4.6–6.5)

## 2016-07-19 ENCOUNTER — Ambulatory Visit (INDEPENDENT_AMBULATORY_CARE_PROVIDER_SITE_OTHER): Payer: Medicare Other | Admitting: Family Medicine

## 2016-07-19 ENCOUNTER — Encounter: Payer: Self-pay | Admitting: Family Medicine

## 2016-07-19 DIAGNOSIS — M25512 Pain in left shoulder: Secondary | ICD-10-CM | POA: Diagnosis not present

## 2016-07-19 DIAGNOSIS — E119 Type 2 diabetes mellitus without complications: Secondary | ICD-10-CM

## 2016-07-19 MED ORDER — INSULIN DETEMIR 100 UNIT/ML ~~LOC~~ SOLN
75.0000 [IU] | Freq: Every day | SUBCUTANEOUS | 3 refills | Status: DC
Start: 2016-07-19 — End: 2017-10-14

## 2016-07-19 NOTE — Patient Instructions (Addendum)
Recheck in about 6 months, physical, labs ahead of time.  No change in meds.   Shoulder exercises.  Rotator cuff irritation.  Take care.  Glad to see you.

## 2016-07-19 NOTE — Progress Notes (Signed)
Diabetes:  Using medications without difficulties: yes Hypoglycemic episodes:not now- prev with higher doses of insulin Hyperglycemic episodes:no Feet problems: occ itching and hot feeling.  Not consistent.   Blood Sugars averaging: ~100-120 usually eye exam within last year:f/u pending at [facility] A1c d/w pt.  Reasonable at 7.7.   Flex sig done 06/21/16 at [facility].  Recheck will be 5 years per patient report.    She'll get a flu shot later this fall.  D/w pt.    Still with L shoulder pain for the last year.  No pain at rest at OV.  But pain with full ROM overhead movement.  Pain with lifting with the L arm.  No acute weakness.    She is in the midst of 5FU treatment for her nasal irritation.    Meds, vitals, and allergies reviewed.   ROS: Per HPI unless specifically indicated in ROS section   GEN: nad, alert and oriented HEENT: mucous membranes moist NECK: supple w/o LA CV: rrr. PULM: ctab, no inc wob ABD: soft, +bs EXT: no edema SKIN: no acute rash Left shoulder with no arm drop, but pain with range of motion overhead. Pain with internal and next femoral rotation. Supraspinatus painful on testing. AC joint nontender. Scapular assist noted with decreased pain and increased range of motion for overhead movement.  Diabetic foot exam: Normal inspection No skin breakdown No calluses  Normal DP pulses Normal sensation to light touch and monofilament Nails normal

## 2016-07-19 NOTE — Progress Notes (Signed)
Pre visit review using our clinic review tool, if applicable. No additional management support is needed unless otherwise documented below in the visit note. 

## 2016-07-20 DIAGNOSIS — M25512 Pain in left shoulder: Secondary | ICD-10-CM | POA: Insufficient documentation

## 2016-07-20 HISTORY — DX: Pain in left shoulder: M25.512

## 2016-07-20 NOTE — Assessment & Plan Note (Signed)
A1c reasonable at 7.7. She isn't having lows, and this is likely the most important thing. Continue medication as is. Continue work on diet. Recheck in a few months. She agrees.

## 2016-07-20 NOTE — Assessment & Plan Note (Signed)
Likely rotator cuff irritation, without sign of full tear. Anatomy discussed with patient. Handout given to patient. Discussed with patient about home exercise routine. If not improved she'll let me know.

## 2016-08-03 ENCOUNTER — Encounter: Payer: Self-pay | Admitting: Family Medicine

## 2016-08-27 LAB — HM DIABETES EYE EXAM

## 2016-09-27 ENCOUNTER — Other Ambulatory Visit: Payer: Self-pay | Admitting: Family Medicine

## 2016-11-22 ENCOUNTER — Other Ambulatory Visit: Payer: Self-pay | Admitting: Family Medicine

## 2016-12-13 ENCOUNTER — Other Ambulatory Visit: Payer: Self-pay | Admitting: Family Medicine

## 2016-12-29 ENCOUNTER — Telehealth: Payer: Self-pay | Admitting: Family Medicine

## 2016-12-29 NOTE — Telephone Encounter (Signed)
Call pt.  I got an order request for DXA.  Last done 2016.  She likely doesn't need this repeated at this point.  Likely wouldn't have had time to change enough to warrant a change in treatment.  I would defer this for now.  I would still proceed with mammogram.  Update me if she has questions.   I can cancel the order for the DXA on the hard copy if needed.  Thanks.

## 2016-12-29 NOTE — Telephone Encounter (Signed)
Patient notified as instructed by telephone and verbalized understanding. Patient stated that if you do not feel that she needs the DXA she is okay with you cancelling the order on the hard copy.

## 2016-12-29 NOTE — Telephone Encounter (Signed)
Cancelled on the hard copy.  Thanks.

## 2016-12-29 NOTE — Telephone Encounter (Signed)
Order faxed as instructed. 

## 2016-12-29 NOTE — Telephone Encounter (Signed)
Left message on answering machine for patient to call back.  

## 2017-01-05 LAB — HM MAMMOGRAPHY: HM Mammogram: NORMAL (ref 0–4)

## 2017-01-10 ENCOUNTER — Other Ambulatory Visit: Payer: Self-pay | Admitting: Family Medicine

## 2017-01-11 ENCOUNTER — Encounter: Payer: Self-pay | Admitting: Family Medicine

## 2017-01-17 ENCOUNTER — Other Ambulatory Visit: Payer: Self-pay | Admitting: Family Medicine

## 2017-01-17 DIAGNOSIS — E119 Type 2 diabetes mellitus without complications: Secondary | ICD-10-CM

## 2017-01-18 ENCOUNTER — Other Ambulatory Visit: Payer: Medicare Other

## 2017-01-18 ENCOUNTER — Ambulatory Visit: Payer: Medicare Other

## 2017-01-20 ENCOUNTER — Other Ambulatory Visit (INDEPENDENT_AMBULATORY_CARE_PROVIDER_SITE_OTHER): Payer: Medicare Other

## 2017-01-20 DIAGNOSIS — E119 Type 2 diabetes mellitus without complications: Secondary | ICD-10-CM | POA: Diagnosis not present

## 2017-01-20 LAB — COMPREHENSIVE METABOLIC PANEL
ALT: 21 U/L (ref 0–35)
AST: 17 U/L (ref 0–37)
Albumin: 3.9 g/dL (ref 3.5–5.2)
Alkaline Phosphatase: 86 U/L (ref 39–117)
BUN: 19 mg/dL (ref 6–23)
CO2: 30 mEq/L (ref 19–32)
Calcium: 10.4 mg/dL (ref 8.4–10.5)
Chloride: 103 mEq/L (ref 96–112)
Creatinine, Ser: 0.52 mg/dL (ref 0.40–1.20)
GFR: 123.43 mL/min (ref >60.00)
Glucose, Bld: 140 mg/dL — ABNORMAL HIGH (ref 70–99)
Potassium: 4.1 mEq/L (ref 3.5–5.1)
Sodium: 140 mEq/L (ref 135–145)
Total Bilirubin: 1.7 mg/dL — ABNORMAL HIGH (ref 0.2–1.2)
Total Protein: 6.4 g/dL (ref 6.0–8.3)

## 2017-01-20 LAB — HEMOGLOBIN A1C: Hgb A1c MFr Bld: 8.3 % — ABNORMAL HIGH (ref 4.6–6.5)

## 2017-01-20 LAB — LIPID PANEL
Cholesterol: 126 mg/dL (ref 0–200)
HDL: 31.5 mg/dL — ABNORMAL LOW (ref >39.00)
LDL Cholesterol: 79 mg/dL (ref 0–99)
NonHDL: 94.58
Total CHOL/HDL Ratio: 4
Triglycerides: 77 mg/dL (ref 0.0–149.0)
VLDL: 15.4 mg/dL (ref 0.0–40.0)

## 2017-01-25 ENCOUNTER — Encounter: Payer: Self-pay | Admitting: Family Medicine

## 2017-01-25 ENCOUNTER — Ambulatory Visit (INDEPENDENT_AMBULATORY_CARE_PROVIDER_SITE_OTHER): Payer: Medicare Other | Admitting: Family Medicine

## 2017-01-25 VITALS — BP 116/72 | HR 72 | Temp 98.8°F | Ht 66.0 in | Wt 161.0 lb

## 2017-01-25 DIAGNOSIS — E119 Type 2 diabetes mellitus without complications: Secondary | ICD-10-CM | POA: Diagnosis not present

## 2017-01-25 DIAGNOSIS — Z0000 Encounter for general adult medical examination without abnormal findings: Secondary | ICD-10-CM | POA: Diagnosis not present

## 2017-01-25 DIAGNOSIS — E7800 Pure hypercholesterolemia, unspecified: Secondary | ICD-10-CM | POA: Diagnosis not present

## 2017-01-25 DIAGNOSIS — J302 Other seasonal allergic rhinitis: Secondary | ICD-10-CM

## 2017-01-25 DIAGNOSIS — I10 Essential (primary) hypertension: Secondary | ICD-10-CM

## 2017-01-25 MED ORDER — LORATADINE 10 MG PO TABS: 10.0000 mg | ORAL_TABLET | Freq: Every day | ORAL | Status: DC

## 2017-01-25 NOTE — Progress Notes (Signed)
Pre visit review using our clinic review tool, if applicable. No additional management support is needed unless otherwise documented below in the visit note. 

## 2017-01-25 NOTE — Patient Instructions (Signed)
No change in regular meds but recheck A1c in 3 months before a visit.  Add on plain claritin.  Take care.  Glad to see you.  Update me as needed.

## 2017-01-25 NOTE — Progress Notes (Signed)
I have personally reviewed the Medicare Annual Wellness questionnaire and have noted 1. The patient's medical and social history 2. Their use of alcohol, tobacco or illicit drugs 3. Their current medications and supplements 4. The patient's functional ability including ADL's, fall risks, home safety risks and hearing or visual             impairment. 5. Diet and physical activities 6. Evidence for depression or mood disorders  The patients weight, height, BMI have been recorded in the chart and visual acuity is per eye clinic.  I have made referrals, counseling and provided education to the patient based review of the above and I have provided the pt with a written personalized care plan for preventive services.  Provider list updated- see scanned forms.  Routine anticipatory guidance given to patient.  See health maintenance. The possibility exists that previously documented standard health maintenance information may have been brought forward from a previous encounter into this note.  If needed, that same information has been updated to reflect the current situation based on today's encounter.    Flu 2017 Shingles 2012 PNA  UTD Tetanus 2014 Colonoscopy 2017 Breast cancer screening up to date. D/w pt.  DXA done 2016. Osteopenia prev noted, but closer to normal than osteoporosis. Consider recheck in ~2021 Advance directive - husband designated if patient were incapacitated.   Cognitive function addressed- see scanned forms- and if abnormal then additional documentation follows  She is helping care for her grandkids since her son changed jobs.  Reasonable to take OTC claritin, d/w pt.    Diabetes:  Using medications without difficulties:yes Hypoglycemic episodes:not on current dosing- goal to avoid lows. Hyperglycemic episodes: no Feet problems: she has some itching/dry skin on her feet but no loss of feeling, treated with OTC lotion Blood Sugars averaging: usually ~100 in the AMs    eye exam within last year: yes  Hypertension:    Using medication without problems or lightheadedness: yes Chest pain with exertion:no Edema:no Short of breath:no  Elevated Cholesterol: Using medications without problems:yes Muscle aches: no Diet compliance:encouraged Exercise:encouraged  Recent allergy sx with voice changes and eye puffiness.    PMH and SH reviewed  Meds, vitals, and allergies reviewed.   ROS: Per HPI.  Unless specifically indicated otherwise in HPI, the patient denies:  General: fever. Eyes: acute vision changes ENT: sore throat Cardiovascular: chest pain Respiratory: SOB GI: vomiting GU: dysuria Musculoskeletal: acute back pain Derm: acute rash Neuro: acute motor dysfunction Psych: worsening mood Endocrine: polydipsia Heme: bleeding Allergy: hayfever  GEN: nad, alert and oriented HEENT: mucous membranes moist, TM wnl, nasal exam slightly stuffy, allergic shiners noted.  NECK: supple w/o LA CV: rrr. PULM: ctab, no inc wob ABD: soft, +bs EXT: no edema SKIN: no acute rash  Diabetic foot exam: Normal inspection No skin breakdown No calluses  Normal DP pulses Normal sensation to light touch and monofilament Nails normal

## 2017-01-26 DIAGNOSIS — J302 Other seasonal allergic rhinitis: Secondary | ICD-10-CM | POA: Insufficient documentation

## 2017-01-26 NOTE — Assessment & Plan Note (Signed)
Reasonable to try Claritin or similar. Would avoid decongestants. Discussed with patient. She agrees.

## 2017-01-26 NOTE — Assessment & Plan Note (Signed)
Controlled. Continue current medications. She is going to work harder on diet and exercise. Labs discussed with patient. She agrees.

## 2017-01-26 NOTE — Assessment & Plan Note (Signed)
Not controlled. Continue current medications. She is going to work harder on diet and exercise. Labs discussed with patient. She agrees. Working more on diet and exercise would likely be more beneficial and less risky than adjusting her insulin. She agreed.

## 2017-01-26 NOTE — Assessment & Plan Note (Signed)
Flu 2017 Shingles 2012 PNA  UTD Tetanus 2014 Colonoscopy 2017 Breast cancer screening up to date. D/w pt.  DXA done 2016. Osteopenia prev noted, but closer to normal than osteoporosis. Consider recheck in ~2021 Advance directive - husband designated if patient were incapacitated.   Cognitive function addressed- see scanned forms- and if abnormal then additional documentation follows

## 2017-02-22 ENCOUNTER — Other Ambulatory Visit: Payer: Self-pay | Admitting: Family Medicine

## 2017-03-21 ENCOUNTER — Other Ambulatory Visit: Payer: Self-pay | Admitting: Family Medicine

## 2017-04-11 ENCOUNTER — Other Ambulatory Visit: Payer: Self-pay | Admitting: Family Medicine

## 2017-04-18 ENCOUNTER — Other Ambulatory Visit: Payer: Self-pay | Admitting: Family Medicine

## 2017-04-18 DIAGNOSIS — E119 Type 2 diabetes mellitus without complications: Secondary | ICD-10-CM

## 2017-04-25 ENCOUNTER — Other Ambulatory Visit (INDEPENDENT_AMBULATORY_CARE_PROVIDER_SITE_OTHER): Payer: Medicare Other

## 2017-04-25 DIAGNOSIS — E119 Type 2 diabetes mellitus without complications: Secondary | ICD-10-CM | POA: Diagnosis not present

## 2017-04-25 LAB — HEMOGLOBIN A1C: Hgb A1c MFr Bld: 7.6 % — ABNORMAL HIGH (ref 4.6–6.5)

## 2017-04-29 ENCOUNTER — Encounter: Payer: Self-pay | Admitting: Family Medicine

## 2017-04-29 ENCOUNTER — Ambulatory Visit (INDEPENDENT_AMBULATORY_CARE_PROVIDER_SITE_OTHER): Payer: Medicare Other | Admitting: Family Medicine

## 2017-04-29 DIAGNOSIS — E119 Type 2 diabetes mellitus without complications: Secondary | ICD-10-CM | POA: Diagnosis not present

## 2017-04-29 NOTE — Patient Instructions (Signed)
Recheck in October, labs ahead of time.  Update me sooner if needed.   Thanks for your effort.  Take care.  Glad to see you.

## 2017-04-29 NOTE — Progress Notes (Signed)
Diabetes:  Using medications without difficulties:yes Hypoglycemic episodes:see below, no sx now.   Hyperglycemic episodes:no Feet problems: not except for some tingling/itching in the R foot on the lateral 1st toe and medial 2nd toe, longstanding.  No other sx.  Not shoe dependent.  This is likely not DM2 related.   Blood Sugars averaging: usually ~100 in the AMs, usually 185 when going to bed but that prevents low sugars at night.  She takes a snack prior to bed if needed.   eye exam within last year: yes A1c improved.  D/w pt.   She has been working on diet more.   Goal of 7.5 A1c is likely reasonable.  D/wpt.    Meds, vitals, and allergies reviewed.   ROS: Per HPI unless specifically indicated in ROS section   GEN: nad, alert and oriented HEENT: mucous membranes moist NECK: supple w/o LA CV: rrr. PULM: ctab, no inc wob ABD: soft, +bs EXT: no edema SKIN: no acute rash  Diabetic foot exam: Normal inspection No skin breakdown No calluses  Normal DP pulses Normal sensation to light touch and monofilament Nails normal

## 2017-05-01 NOTE — Assessment & Plan Note (Signed)
A1c improved.  D/w pt.   She has been working on diet more.   Goal of 7.5 A1c is likely reasonable.  D/wpt.   Recheck in October, labs ahead of time.  Update me sooner if needed.

## 2017-07-11 ENCOUNTER — Other Ambulatory Visit: Payer: Self-pay | Admitting: Family Medicine

## 2017-08-23 HISTORY — PX: BASAL CELL CARCINOMA EXCISION: SHX1214

## 2017-08-28 ENCOUNTER — Other Ambulatory Visit: Payer: Self-pay | Admitting: Family Medicine

## 2017-08-28 DIAGNOSIS — E119 Type 2 diabetes mellitus without complications: Secondary | ICD-10-CM

## 2017-08-29 ENCOUNTER — Other Ambulatory Visit (INDEPENDENT_AMBULATORY_CARE_PROVIDER_SITE_OTHER): Payer: Medicare Other

## 2017-08-29 DIAGNOSIS — E119 Type 2 diabetes mellitus without complications: Secondary | ICD-10-CM | POA: Diagnosis not present

## 2017-08-29 LAB — HEMOGLOBIN A1C: Hgb A1c MFr Bld: 7.7 % — ABNORMAL HIGH (ref 4.6–6.5)

## 2017-09-01 LAB — HM DIABETES EYE EXAM

## 2017-09-02 ENCOUNTER — Encounter: Payer: Self-pay | Admitting: Family Medicine

## 2017-09-02 ENCOUNTER — Ambulatory Visit (INDEPENDENT_AMBULATORY_CARE_PROVIDER_SITE_OTHER): Payer: Medicare Other | Admitting: Family Medicine

## 2017-09-02 DIAGNOSIS — E119 Type 2 diabetes mellitus without complications: Secondary | ICD-10-CM

## 2017-09-02 NOTE — Patient Instructions (Addendum)
Annual medicare wellness visit in ~01/2018.   Update me as needed in the meantime.  Take care.  Glad to see you.

## 2017-09-02 NOTE — Progress Notes (Addendum)
Diabetes:  Using medications without difficulties: see below, improved more recently.  Hypoglycemic episodes: see below Hyperglycemic episodes: no Feet problems:no tingling.  Some itching but improved with otc lotion and keeping her feet dry Blood Sugars averaging: 100-120 recently in the AM eye exam within last year: yesterday.  No retinopathy.  Done at [facility]  Small cataracts but no intervention needed.   She has lower sugars while on vacation in [place], likely from foods with lower carbs.   D/w pt.  She had a great trip otherwise.   Flu shot done.   A1c d/w pt.  Reasonable at A1c ~7.5 and I don't want to induce hypoglycemia.    Meds, vitals, and allergies reviewed.   ROS: Per HPI unless specifically indicated in ROS section   GEN: nad, alert and oriented HEENT: mucous membranes moist NECK: supple w/o LA, she has bilateral prominence under the chin that is not associated with her thyroid. Not tender to palpation. Present for years and unchanged per patient. CV: rrr. Murmur noted.  PULM: ctab, no inc wob ABD: soft, +bs EXT: no edema SKIN: no acute rash  Diabetic foot exam: Normal inspection No skin breakdown No calluses  Normal DP pulses Normal sensation to light touch and monofilament Nails normal

## 2017-09-04 NOTE — Assessment & Plan Note (Signed)
A1c d/w pt.  Reasonable at A1c ~7.5 and I don't want to induce hypoglycemia.  Annual medicare wellness visit in ~01/2018.   Update me as needed in the meantime.  She agrees.  No change in meds.

## 2017-10-10 ENCOUNTER — Other Ambulatory Visit: Payer: Self-pay | Admitting: Family Medicine

## 2017-10-14 ENCOUNTER — Other Ambulatory Visit: Payer: Self-pay | Admitting: Family Medicine

## 2018-01-11 LAB — HM MAMMOGRAPHY

## 2018-01-23 ENCOUNTER — Other Ambulatory Visit: Payer: Self-pay | Admitting: Family Medicine

## 2018-02-02 ENCOUNTER — Ambulatory Visit (INDEPENDENT_AMBULATORY_CARE_PROVIDER_SITE_OTHER): Payer: Medicare Other

## 2018-02-02 ENCOUNTER — Other Ambulatory Visit: Payer: Self-pay | Admitting: Family Medicine

## 2018-02-02 ENCOUNTER — Ambulatory Visit: Payer: Medicare Other

## 2018-02-02 VITALS — BP 110/70 | HR 77 | Temp 98.1°F | Ht 65.5 in | Wt 156.5 lb

## 2018-02-02 DIAGNOSIS — E119 Type 2 diabetes mellitus without complications: Secondary | ICD-10-CM

## 2018-02-02 DIAGNOSIS — Z0000 Encounter for general adult medical examination without abnormal findings: Secondary | ICD-10-CM

## 2018-02-02 LAB — LIPID PANEL
Cholesterol: 125 mg/dL (ref 0–200)
HDL: 34 mg/dL — ABNORMAL LOW (ref >39.00)
LDL Cholesterol: 79 mg/dL (ref 0–99)
NonHDL: 90.99
Total CHOL/HDL Ratio: 4
Triglycerides: 58 mg/dL (ref 0.0–149.0)
VLDL: 11.6 mg/dL (ref 0.0–40.0)

## 2018-02-02 LAB — COMPREHENSIVE METABOLIC PANEL
ALT: 18 U/L (ref 0–35)
AST: 15 U/L (ref 0–37)
Albumin: 3.9 g/dL (ref 3.5–5.2)
Alkaline Phosphatase: 98 U/L (ref 39–117)
BUN: 23 mg/dL (ref 6–23)
CO2: 29 mEq/L (ref 19–32)
Calcium: 10.5 mg/dL (ref 8.4–10.5)
Chloride: 103 mEq/L (ref 96–112)
Creatinine, Ser: 0.51 mg/dL (ref 0.40–1.20)
GFR: 125.86 mL/min (ref >60.00)
Glucose, Bld: 143 mg/dL — ABNORMAL HIGH (ref 70–99)
Potassium: 4.5 mEq/L (ref 3.5–5.1)
Sodium: 139 mEq/L (ref 135–145)
Total Bilirubin: 1.8 mg/dL — ABNORMAL HIGH (ref 0.2–1.2)
Total Protein: 6.8 g/dL (ref 6.0–8.3)

## 2018-02-02 LAB — HEMOGLOBIN A1C: Hgb A1c MFr Bld: 7.9 % — ABNORMAL HIGH (ref 4.6–6.5)

## 2018-02-02 NOTE — Patient Instructions (Signed)
Gloria [name] , Thank you for taking time to come for your Medicare Wellness Visit. I appreciate your ongoing commitment to your health goals. Please review the following plan we discussed and let me know if I can assist you in the future.   These are the goals we discussed: Goals    . Increase physical activity     Starting 02/02/2018, I will continue to exercise for 40-60 minutes 4 days per week.        This is a list of the screening recommended for you and due dates:  Health Maintenance  Topic Date Due  . Hemoglobin A1C  08/05/2018  . Eye exam for diabetics  09/01/2018  . Complete foot exam   09/02/2018  . Mammogram  01/12/2019  . Colon Cancer Screening  07/07/2019  . Tetanus Vaccine  01/02/2023  . Flu Shot  Completed  . DEXA scan (bone density measurement)  Completed  .  Hepatitis C: One time screening is recommended by Center for Disease Control  (CDC) for  adults born from [birth year] through 1966.   Completed  . Pneumonia vaccines  Completed   Preventive Care for Adults  A healthy lifestyle and preventive care can promote health and wellness. Preventive health guidelines for adults include the following key practices.  . A routine yearly physical is a good way to check with your health care provider about your health and preventive screening. It is a chance to share any concerns and updates on your health and to receive a thorough exam.  . Visit your dentist for a routine exam and preventive care every 6 months. Brush your teeth twice a day and floss once a day. Good oral hygiene prevents tooth decay and gum disease.  . The frequency of eye exams is based on your age, health, family medical history, use  of contact lenses, and other factors. Follow your health care provider's recommendations for frequency of eye exams.  . Eat a healthy diet. Foods like vegetables, fruits, whole grains, low-fat dairy products, and lean protein foods contain the nutrients you need without too many  calories. Decrease your intake of foods high in solid fats, added sugars, and salt. Eat the right amount of calories for you. Get information about a proper diet from your health care provider, if necessary.  . Regular physical exercise is one of the most important things you can do for your health. Most adults should get at least 150 minutes of moderate-intensity exercise (any activity that increases your heart rate and causes you to sweat) each week. In addition, most adults need muscle-strengthening exercises on 2 or more days a week.  Silver Sneakers may be a benefit available to you. To determine eligibility, you may visit the website: www.silversneakers.com or contact program at 512-151-4555 Mon-Fri between 8AM-8PM.   . Maintain a healthy weight. The body mass index (BMI) is a screening tool to identify possible weight problems. It provides an estimate of body fat based on height and weight. Your health care provider can find your BMI and can help you achieve or maintain a healthy weight.   For adults 20 years and older: ? A BMI below 18.5 is considered underweight. ? A BMI of 18.5 to 24.9 is normal. ? A BMI of 25 to 29.9 is considered overweight. ? A BMI of 30 and above is considered obese.   . Maintain normal blood lipids and cholesterol levels by exercising and minimizing your intake of saturated fat. Eat a balanced diet with  plenty of fruit and vegetables. Blood tests for lipids and cholesterol should begin at age 81 and be repeated every 5 years. If your lipid or cholesterol levels are high, you are over 50, or you are at high risk for heart disease, you may need your cholesterol levels checked more frequently. Ongoing high lipid and cholesterol levels should be treated with medicines if diet and exercise are not working.  . If you smoke, find out from your health care provider how to quit. If you do not use tobacco, please do not start.  . If you choose to drink alcohol, please do  not consume more than 2 drinks per day. One drink is considered to be 12 ounces (355 mL) of beer, 5 ounces (148 mL) of wine, or 1.5 ounces (44 mL) of liquor.  . If you are 68-39 years old, ask your health care provider if you should take aspirin to prevent strokes.  . Use sunscreen. Apply sunscreen liberally and repeatedly throughout the day. You should seek shade when your shadow is shorter than you. Protect yourself by wearing long sleeves, pants, a wide-brimmed hat, and sunglasses year round, whenever you are outdoors.  . Once a month, do a whole body skin exam, using a mirror to look at the skin on your back. Tell your health care provider of new moles, moles that have irregular borders, moles that are larger than a pencil eraser, or moles that have changed in shape or color.

## 2018-02-02 NOTE — Progress Notes (Signed)
PCP notes:   Health maintenance:  A1C - completed Mammogram - completed on 01/11/2018  Abnormal screenings:   Hearing - failed  Hearing Screening   125Hz  250Hz  500Hz  1000Hz  2000Hz  3000Hz  4000Hz  6000Hz  8000Hz   Right ear:   40 40 40  40    Left ear:   40 40 40  0     Patient concerns:   Patient reports she has a basal cell carcinoma on her neck and will have surgery in near future.  Nurse concerns:  None  Next PCP appt:   02/07/2018 @ 0845  I reviewed health advisor's note, was available for consultation on the day of service listed in this note, and agree with documentation and plan. Elsie Stain, MD.

## 2018-02-02 NOTE — Progress Notes (Signed)
Subjective:   Gloria [name] is a 72 y.o. female who presents for Medicare Annual (Subsequent) preventive examination.  Review of Systems:  N/A Cardiac Risk Factors include: advanced age (>2mn, >>77women);diabetes mellitus;dyslipidemia;hypertension     Objective:     Vitals: BP 110/70 (BP Location: Right Arm, Patient Position: Sitting, Cuff Size: Normal)   Pulse 77   Temp 98.1 F (36.7 C) (Oral)   Ht 5' 5.5" (1.664 m) Comment: no shoes  Wt 156 lb 8 oz (71 kg)   LMP 08/08/2009   SpO2 95%   BMI 25.65 kg/m   Body mass index is 25.65 kg/m.  Advanced Directives 02/02/2018  Does Patient Have a Medical Advance Directive? Yes  Type of [place]  Copy of H[place]aryin Chart? No - copy requested    Tobacco Social History   Tobacco Use  Smoking Status Never Smoker  Smokeless Tobacco Never Used     Counseling given: No   Clinical Intake:  Pre-visit preparation completed: Yes  Pain : No/denies pain Pain Score: 0-No pain     Nutritional Status: BMI 25 -29 Overweight Nutritional Risks: None Diabetes: Yes CBG done?: No Did pt. bring in CBG monitor from home?: No  How often do you need to have someone help you when you read instructions, pamphlets, or other written materials from your doctor or pharmacy?: 1 - Never What is the last grade level you completed in school?: Masters degree  Interpreter Needed?: No  Comments: pt lives with spouse Information entered by :: LPinson, LPN  Past Medical History:  Diagnosis Date  . Allergic rhinitis   . Cardiac murmur 2011   with normal echo  . Diabetes mellitus    Type II  . Hemorrhoids   . Hyperlipidemia   . Hypertension    Past Surgical History:  Procedure Laterality Date  . ABDOMINAL HYSTERECTOMY    . BASAL CELL CARCINOMA EXCISION  08/23/2017   left upper arm  . CARPAL TUNNEL RELEASE     with bilateral releases  . CYSTECTOMY     Left breast,  right lumpectomy B9  . INGUINAL HERNIA REPAIR     Right (Dr. YAnnamaria Boots  . NSVD     X 2  . TVH with vaginal prolapse  08/29/09   Transvag Tape w/Varitensor, Ant & Post Colporraphy, Uterosacral Lig susp, McCall Culdoplasty  . ueteroscopy  11/01   without laser secondary stone  . Urological procedure  10/22 - 08/31/09   [facility]   Family History  Problem Relation Age of Onset  . Hypertension Mother   . Heart disease Mother        CHF  . Breast cancer Cousin        Breast CA  . Alcohol abuse Cousin   . Stroke Maternal Aunt   . Diabetes Paternal Aunt 74  . Stroke Paternal Aunt   . Heart disease Paternal Uncle        MI  . Heart disease Paternal Uncle        MI  . Thyroid disease Other   . Prostate cancer Son   . Colon cancer Neg Hx    Social History   Socioeconomic History  . Marital status: Married    Spouse name: Not on file  . Number of children: 2  . Years of education: Not on file  . Highest education level: Not on file  Occupational History  . Occupation: Retired tPharmacist, hospital(primary education)  from the [employer]    Employer: RETIRED  Social Needs  . Financial resource strain: Not on file  . Food insecurity:    Worry: Not on file    Inability: Not on file  . Transportation needs:    Medical: Not on file    Non-medical: Not on file  Tobacco Use  . Smoking status: Never Smoker  . Smokeless tobacco: Never Used  Substance and Sexual Activity  . Alcohol use: Yes    Alcohol/week: 0.0 oz    Comment: rare  . Drug use: No  . Sexual activity: Not on file  Lifestyle  . Physical activity:    Days per week: Not on file    Minutes per session: Not on file  . Stress: Not on file  Relationships  . Social connections:    Talks on phone: Not on file    Gets together: Not on file    Attends religious service: Not on file    Active member of club or organization: Not on file    Attends meetings of clubs or organizations: Not on file    Relationship status: Not  on file  Other Topics Concern  . Not on file  Social History Narrative   Retired Pharmacist, hospital   Marital Status: Married, 1968   Children: 2 children out of the home    Outpatient Encounter Medications as of 02/02/2018  Medication Sig  . ACCU-CHEK AVIVA PLUS test strip CHECK BLOOD SUGAR 4 TIMES A DAY AS NEEDED  . ACCU-CHEK FASTCLIX LANCETS MISC Use to check blood sugar 4 times daily as needed.  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  Marland Kitchen aspirin 81 MG tablet Take 81 mg by mouth daily.    Marland Kitchen atorvastatin (LIPITOR) 10 MG tablet TAKE ONE TABLET EVERY DAY  . Blood Glucose Monitoring Suppl (ACCU-CHEK AVIVA PLUS) W/DEVICE KIT Use to check blood sugar 4 times daily as needed.  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 Cholecalciferol (VITAMIN D) 1000 UNITS capsule Take 2,000 Units by mouth daily.   . hydrochlorothiazide (HYDRODIURIL) 25 MG tablet TAKE 1 TABLET BY MOUTH DAILY  . Insulin Pen Needle (B-D ULTRAFINE III SHORT PEN) 31G X 8 MM MISC USE AS DIRECTED  . LEVEMIR FLEXTOUCH 100 UNIT/ML Pen INJECT 75 UNITS SUBQ AT BEDTIME  . loratadine (CLARITIN) 10 MG tablet Take 1 tablet (10 mg total) by mouth daily.  . metFORMIN (GLUCOPHAGE) 1000 MG tablet TAKE ONE TABLET BY MOUTH TWICE DAILY WITH MEALS  . Psyllium (METAMUCIL PO) Take by mouth daily.  . quinapril (ACCUPRIL) 20 MG tablet TAKE ONE TABLET TWICE DAILY   No facility-administered encounter medications on file as of 02/02/2018.     Activities of Daily Living In your present state of health, do you have any difficulty performing the following activities: 02/02/2018  Hearing? N  Vision? N  Difficulty concentrating or making decisions? N  Walking or climbing stairs? N  Dressing or bathing? N  Doing errands, shopping? N  Preparing Food and eating ? N  Using the Toilet? N  In the past six months, have you accidently leaked urine? N  Do you have problems with loss of bowel control? Y  Managing your Medications? N  Managing your Finances? N  Housekeeping  or managing your Housekeeping? N  Some recent data might be hidden    Patient Care Team: Tonia Ghent, MD as PCP - General (Family Medicine)    Assessment:   This is a routine wellness examination  for [employer].   Hearing Screening   _0  _1  _2  _3  _4  _5  _6  _7  _8   Right ear:   40 40 40  40    Left ear:   40 40 40  0    Vision Screening Comments: Vision exam in Oct 2018 @ [facility]/Dr. North Hodge and Dietary recommendations Current Exercise Habits: Structured exercise class;Home exercise routine, Type of exercise: walking, Time (Minutes): 50, Frequency (Times/Week): 4, Weekly Exercise (Minutes/Week): 200, Intensity: Moderate, Exercise limited by: None identified  Goals    . Increase physical activity     Starting 02/02/2018, I will continue to exercise for 40-60 minutes 4 days per week.        Fall Risk Fall Risk  02/02/2018 01/25/2017 01/15/2016 01/13/2015 01/11/2014  Falls in the past year? _9    Depression Screen PHQ 2/9 Scores 02/02/2018 01/25/2017 01/15/2016 01/13/2015  PHQ - 2 Score 0 0 0 0  PHQ- 9 Score 0 - - -     Cognitive Function MMSE - Mini Mental State Exam 02/02/2018  Orientation to time 5  Orientation to Place 5  Registration 3  Attention/ Calculation 0  Recall 3  Language- name 2 objects 0  Language- repeat 1  Language- follow 3 step command 3  Language- read & follow direction 0  Write a sentence 0  Copy design 0  Total score 20     PLEASE NOTE: A Mini-Cog screen was completed. Maximum score is 20. A value of 0 denotes this part of Folstein MMSE was not completed or the patient failed this part of the Mini-Cog screening.   Mini-Cog Screening Orientation to Time - Max 5 pts Orientation to Place - Max 5 pts Registration - Max 3 pts Recall - Max 3 pts Language Repeat - Max 1 pts Language Follow 3 Step Command - Max 3 pts     Immunization History  Administered Date(s) Administered  .  Influenza Split 09/28/2011  . Influenza Whole 09/08/2005, 08/27/2010  . Influenza, High Dose Seasonal PF 08/21/2016  . Influenza-Unspecified 08/01/2014, 09/19/2015, 08/30/2017  . Pneumococcal Conjugate-13 01/13/2015  . Pneumococcal Polysaccharide-23 12/24/2010  . Td 12/28/2002, 01/02/2013  . Zoster 12/24/2010   Screening Tests Health Maintenance  Topic Date Due  . HEMOGLOBIN A1C  08/05/2018  . OPHTHALMOLOGY EXAM  09/01/2018  . FOOT EXAM  09/02/2018  . MAMMOGRAM  01/12/2019  . COLONOSCOPY  07/07/2019  . TETANUS/TDAP  01/02/2023  . INFLUENZA VACCINE  Completed  . DEXA SCAN  Completed  . Hepatitis C Screening  Completed  . PNA vac Low Risk Adult  Completed      Plan:     I have personally reviewed, addressed, and noted the following in the patient's chart:  A. Medical and social history B. Use of alcohol, tobacco or illicit drugs  C. Current medications and supplements D. Functional ability and status E.  Nutritional status F.  Physical activity G. Advance directives H. List of other physicians I.  Hospitalizations, surgeries, and ER visits in previous 12 months J.  [place] to include hearing, vision, cognitive, depression L. Referrals and appointments - none  In addition, I have reviewed and discussed with patient certain preventive protocols, quality metrics, and best practice recommendations. A written personalized care plan for preventive services as well as general preventive health recommendations were provided to patient.  See attached scanned questionnaire for additional information.   Signed,   Lindell Noe, MHA, BS, LPN Health Coach

## 2018-02-07 ENCOUNTER — Encounter: Payer: Self-pay | Admitting: Family Medicine

## 2018-02-07 ENCOUNTER — Ambulatory Visit (INDEPENDENT_AMBULATORY_CARE_PROVIDER_SITE_OTHER): Payer: Medicare Other | Admitting: Family Medicine

## 2018-02-07 ENCOUNTER — Telehealth: Payer: Self-pay | Admitting: Family Medicine

## 2018-02-07 ENCOUNTER — Ambulatory Visit (INDEPENDENT_AMBULATORY_CARE_PROVIDER_SITE_OTHER)
Admission: RE | Admit: 2018-02-07 | Discharge: 2018-02-07 | Disposition: A | Discharge status: Home / Self Care | Payer: Medicare Other | Source: Ambulatory Visit | Attending: Family Medicine | Admitting: Family Medicine

## 2018-02-07 VITALS — BP 128/72 | HR 81 | Temp 98.8°F | Wt 160.5 lb

## 2018-02-07 DIAGNOSIS — Z0000 Encounter for general adult medical examination without abnormal findings: Secondary | ICD-10-CM

## 2018-02-07 DIAGNOSIS — Z7189 Other specified counseling: Secondary | ICD-10-CM

## 2018-02-07 DIAGNOSIS — I10 Essential (primary) hypertension: Secondary | ICD-10-CM | POA: Diagnosis not present

## 2018-02-07 DIAGNOSIS — N8110 Cystocele, unspecified: Secondary | ICD-10-CM | POA: Diagnosis not present

## 2018-02-07 DIAGNOSIS — M25512 Pain in left shoulder: Secondary | ICD-10-CM | POA: Diagnosis not present

## 2018-02-07 DIAGNOSIS — E7800 Pure hypercholesterolemia, unspecified: Secondary | ICD-10-CM

## 2018-02-07 DIAGNOSIS — E119 Type 2 diabetes mellitus without complications: Secondary | ICD-10-CM

## 2018-02-07 DIAGNOSIS — R32 Unspecified urinary incontinence: Secondary | ICD-10-CM

## 2018-02-07 NOTE — Progress Notes (Signed)
Diabetes:  Using medications without difficulties: yes Hypoglycemic episodes:no Hyperglycemic episodes:no Feet problems: no Blood Sugars averaging: 105 this AM.   eye exam within last year:yes Labs d/w pt.  A1c similar to prev.  Reasonable A1c as is.    Elevated Cholesterol: Using medications without problems:yes Muscle aches: no Diet compliance:yes Exercise:yes Labs d/w pt.    Hypertension:    Using medication without problems or lightheadedness: yes Chest pain with exertion:no Edema:no Short of breath:no  L shoulder pain.  Pain sleeping on L side.  Pain with some abduction above her head.  PROM is normal but AROM is affected.  She has done home exercises.    shingrix back ordered.  D/w pt.   Mammogram - completed on 01/11/2018, neg per patient report.   Colonoscopy 2020 DXA 2016.  Okay to defer for now.   Diet and exercise d/w pt.  She is working on both.  She has a Clinical research associate.  She is walking.  Diet is good.   Hearing screening d/w pt.  Declined hearing aids.   It may be reasonable to stop ASA, dw pt.   Advance directive d/w pt- husband designated if patient were incapacitated.    She wanted recheck re: prev bladder tack.  She was asking about options.  She wanted to know if she could get a pessary.  D/w pt.    Patient reports she has a basal cell carcinoma on her neck and will have surgery in near future.  I'll defer to dermatology.    PMH and SH reviewed  Meds, vitals, and allergies reviewed.   ROS: Per HPI unless specifically indicated in ROS section   GEN: nad, alert and oriented HEENT: mucous membranes moist NECK: supple w/o LA CV: rrr. PULM: ctab, no inc wob ABD: soft, +bs EXT: no edema SKIN: no acute rash L shoulder with no arm drop but pain on ext > int rotation and supraspinatus weakness.  Distally NV intact.   Diabetic foot exam: Normal inspection No skin breakdown No calluses  Normal DP pulses Normal sensation to light touch and monofilament Nails  normal

## 2018-02-07 NOTE — Patient Instructions (Addendum)
Check with your insurance to see if they will cover the shingles shot. Go to the lab on the way out.  We'll contact you with your xray report. We may need to get you set up with Dr. Lorelei Pont but let me see your xray first.  Take care.  Glad to see you.  Recheck in about 6 months.   Update me as needed.

## 2018-02-07 NOTE — Telephone Encounter (Signed)
Pt was seen earlier today and had discussion about pessary.

## 2018-02-07 NOTE — Telephone Encounter (Signed)
Copied from [place] [id number]. Topic: General - Other >> Feb 07, 2018  1:34 PM Darl Householder, RMA wrote: Reason for CRM: Gloria [name] from [facility] urology states that yes a pessary can be done in this office

## 2018-02-08 NOTE — Telephone Encounter (Signed)
Let her know that I put in the referral and we'll be in touch with her about the appointment.  Thanks.

## 2018-02-08 NOTE — Telephone Encounter (Signed)
Patient notified as instructed by telephone and verbalized understanding. 

## 2018-02-09 ENCOUNTER — Telehealth: Payer: Self-pay

## 2018-02-09 DIAGNOSIS — Z0000 Encounter for general adult medical examination without abnormal findings: Secondary | ICD-10-CM | POA: Insufficient documentation

## 2018-02-09 HISTORY — DX: Encounter for general adult medical examination without abnormal findings: Z00.00

## 2018-02-09 NOTE — Assessment & Plan Note (Signed)
Labs discussed with patient.  A1c similar to previous.  Reasonable A1c given her age.  Continue as is.  Continue work on diet and exercise.  Recheck periodically.

## 2018-02-09 NOTE — Assessment & Plan Note (Signed)
shingrix back ordered.  D/w pt.   Mammogram - completed on 01/11/2018, neg per patient report.   Colonoscopy 2020 DXA 2016.  Okay to defer for now.   Diet and exercise d/w pt.  She is working on both.  She has a Clinical research associate.  She is walking.  Diet is good.   Hearing screening d/w pt.  Declined hearing aids.   It may be reasonable to stop ASA, dw pt.   Advance directive d/w pt- husband designated if patient were incapacitated.

## 2018-02-09 NOTE — Assessment & Plan Note (Signed)
Reasonable to check x-ray.  See notes on imaging.

## 2018-02-09 NOTE — Assessment & Plan Note (Signed)
Advance directive d/w pt- husband designated if patient were incapacitated.   ?

## 2018-02-09 NOTE — Telephone Encounter (Signed)
Left message for patient to call Gloria back in regards to a referral-Gloria [name], RMA   

## 2018-02-09 NOTE — Assessment & Plan Note (Signed)
Continue as is.  Continue work on diet and exercise.  Recheck periodically.

## 2018-02-09 NOTE — Assessment & Plan Note (Signed)
Refer to urology.  ?

## 2018-02-09 NOTE — Assessment & Plan Note (Addendum)
Controlled.  No change in meds.  Labs discussed with patient. Continue as is.  Continue work on diet and exercise.  Recheck periodically. >25 minutes spent in face to face time with patient, >50% spent in counselling or coordination of care, discussing diabetes, high blood pressure, shoulder pain, etc.

## 2018-02-14 ENCOUNTER — Encounter: Payer: Self-pay | Admitting: Family Medicine

## 2018-02-15 ENCOUNTER — Ambulatory Visit: Payer: Medicare Other | Admitting: Family Medicine

## 2018-02-15 ENCOUNTER — Encounter: Payer: Self-pay | Admitting: Family Medicine

## 2018-02-15 VITALS — BP 120/62 | HR 81 | Temp 98.8°F | Ht 65.5 in | Wt 162.2 lb

## 2018-02-15 DIAGNOSIS — M19012 Primary osteoarthritis, left shoulder: Secondary | ICD-10-CM

## 2018-02-15 DIAGNOSIS — G2589 Other specified extrapyramidal and movement disorders: Secondary | ICD-10-CM | POA: Diagnosis not present

## 2018-02-15 NOTE — Progress Notes (Signed)
Dr. Frederico Hamman T. Asjah Rauda, MD, [facility] [place], [address] Phone: [phone] Fax: [phone]  02/15/2018  Patient: Gloria [name], MRN: [id number], DOB: 1946/01/25, 72 y.o.  Primary Physician:  Tonia Ghent, MD   Chief Complaint  Patient presents with  . Shoulder Pain    Left   Subjective:   Gloria [name] is a 72 y.o. very pleasant female patient who presents with the following:  Referring MD: Dr. Damita Dunnings  Very pleasant young lady at age 72 who is still quite active but presents with a several year history of left-sided shoulder pain that gives her some aches and pains off and on but not at a constant level.  She does not describe any prior history of traumatic injury including no fractures, no dislocations, and no prior surgery on the affected joint.  She does have a day-to-day ache with movement, some some pain with abduction, she is also noticed some winging of her scapula particularly with certain movements of the left shoulder.  None of this really happens to the right shoulder at all.  She does have a history of diabetes, but her range of motion has been normal and preserved.  She does occasionally take some Tylenol or anti-inflammatories, but relatively rarely.  The radiological images were independently reviewed by myself in the office and results were reviewed with the patient. 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  There is also a significant inferior spur present. AC joint: mild to moderate arthropathy Impingement pathology: Type 2 [place] Electronically Signed  By: Owens Loffler, MD On: 02/17/2018 8:17 AM  Past Medical History, Surgical History, Social  History, Family History, Problem List, Medications, and Allergies have been reviewed and updated if relevant.  Patient Active Problem List   Diagnosis Date Noted  . Health care maintenance 02/09/2018  . Seasonal allergies 01/26/2017  . Left shoulder pain 07/20/2016  . Hemorrhoids 01/14/2015  . Female cystocele 08/10/2012  . Ureteral stone with hydronephrosis 08/02/2012  . Advance directive discussed with patient 01/03/2012  . Medicare annual wellness visit, subsequent 01/03/2012  . Essential hypertension, benign 03/30/2011  . Pure hypercholesterolemia 03/30/2011  . RHINITIS 12/24/2010  . ABDOMINAL PAIN OTHER SPECIFIED SITE 08/27/2010  . CARDIAC MURMUR 05/26/2010  . MENOPAUSE, SURGICAL 12/16/2009  . Disorder of bone and cartilage 12/20/2007  . Diabetes mellitus without complication ([place]) 52/48/1859  . RENAL CALCULUS 05/24/2007    Past Medical History:  Diagnosis Date  . Allergic rhinitis   . Cardiac murmur 2011   with normal echo  . Diabetes mellitus    Type II  . Hemorrhoids   . Hyperlipidemia   . Hypertension     Past Surgical History:  Procedure Laterality Date  . ABDOMINAL HYSTERECTOMY    . BASAL CELL CARCINOMA EXCISION  08/23/2017   left upper arm  . CARPAL TUNNEL RELEASE     with bilateral releases  . CYSTECTOMY     Left breast, right lumpectomy B9  . INGUINAL HERNIA REPAIR     Right (Dr. Annamaria Boots)  . NSVD     X 2  . TVH with vaginal prolapse  08/29/09   Transvag Tape w/Varitensor, Ant & Post Colporraphy, Uterosacral Lig susp, McCall Culdoplasty  . ueteroscopy  11/01   without  laser secondary stone  . Urological procedure  10/22 - 08/31/09   [facility]    Social History   Socioeconomic History  . Marital status: Married    Spouse name: Not on file  . Number of children: 2  . Years of education: Not on file  . Highest education level: Not on file  Occupational History  . Occupation: Retired Pharmacist, hospital (primary education) from the [employer]     Employer: [employer]  . Financial resource strain: Not on file  . Food insecurity:    Worry: Not on file    Inability: Not on file  . Transportation needs:    Medical: Not on file    Non-medical: Not on file  Tobacco Use  . Smoking status: Never Smoker  . Smokeless tobacco: Never Used  Substance and Sexual Activity  . Alcohol use: Yes    Alcohol/week: 0.0 oz    Comment: rare  . Drug use: No  . Sexual activity: Not on file  Lifestyle  . Physical activity:    Days per week: Not on file    Minutes per session: Not on file  . Stress: Not on file  Relationships  . Social connections:    Talks on phone: Not on file    Gets together: Not on file    Attends religious service: Not on file    Active member of club or organization: Not on file    Attends meetings of clubs or organizations: Not on file    Relationship status: Not on file  . Intimate partner violence:    Fear of current or ex partner: Not on file    Emotionally abused: Not on file    Physically abused: Not on file    Forced sexual activity: Not on file  Other Topics Concern  . Not on file  Social History Narrative   Retired Pharmacist, hospital   Marital Status: Married, 1968   Children: 2 children out of the home    Family History  Problem Relation Age of Onset  . Hypertension Mother   . Heart disease Mother        CHF  . Breast cancer Cousin        Breast CA  . Alcohol abuse Cousin   . Stroke Maternal Aunt   . Diabetes Paternal Aunt 74  . Stroke Paternal Aunt   . Heart disease Paternal Uncle        MI  . Heart disease Paternal Uncle        MI  . Thyroid disease Other   . Prostate cancer Son   . Colon cancer Neg Hx     Allergies  Allergen Reactions  . Penicillins     REACTION: rash  . Sulfonamide Derivatives     REACTION: unspecified  . Ciprofloxin Hcl [Ciprofloxacin] Rash    itching    Medication list reviewed and updated in full in [facility].  GEN: No fevers, chills. Nontoxic.  Primarily MSK c/o today. MSK: Detailed in the HPI GI: tolerating PO intake without difficulty Neuro: No numbness, parasthesias, or tingling associated. Otherwise the pertinent positives of the ROS are noted above.   Objective:   BP 120/62   Pulse 81   Temp 98.8 F (37.1 C) (Oral)   Ht 5' 5.5" (1.664 m)   Wt 162 lb 4 oz (73.6 kg)   LMP 08/08/2009   BMI 26.59 kg/m    GEN: WDWN, NAD, Non-toxic, Alert & Oriented x  3 HEENT: Atraumatic, Normocephalic.  Ears and Nose: No external deformity. EXTR: No clubbing/cyanosis/edema NEURO: Normal gait.  PSYCH: Normally interactive. Conversant. Not depressed or anxious appearing.  Calm demeanor.   Shoulder: L Inspection: L sided winging, notable with motion Ecchymosis/edema: neg  Crepitus with motion AC joint, scapula, clavicle: NT Cervical spine: NT, full ROM Spurling's: neg Abduction: full, 5/5 Flexion: full, 5/5 IR, full, lift-off: 5/5 ER at neutral: full, 5/5 AC crossover and compression: mild pos Neer: neg Hawkins: mild pos Drop Test: neg Empty Can: neg Supraspinatus insertion: NT Bicipital groove: NT Speed's: mild pos Yergason's: neg Sulcus sign: neg Scapular dyskinesis: none C5-T1 intact Sensation intact Grip 5/5   Radiology: Dg Shoulder Left  Result Date: 02/07/2018 CLINICAL DATA:  Left shoulder pain EXAM: LEFT SHOULDER - 2+ VIEW COMPARISON:  None. FINDINGS: There is significant degenerative joint disease of the left shoulder with considerable loss of joint space and sclerosis with spurring present. There is also a downward sloping acromion which predisposes to impingement. No acute fracture is seen. The [facility] joint appears normally aligned. IMPRESSION: Significant degenerative joint disease of the left shoulder. No acute abnormality. Electronically Signed   By: Ivar Drape M.D.   On: 02/07/2018 12:06   See above.  Assessment and Plan:   Glenohumeral arthritis, left  Scapular dyskinesis  I think that the dominant  finding in this case is significant osteoarthritis at the glenohumeral joint, and I think that is the primary driver of her generalized aches and pains in the shoulder as well as crepitus and pain with motion.  Likely, this was the initial driver as a pain mechanism that has caused her scapula to wean sometimes with weakened scapular stabilizers.  Her motion is better than I would normally anticipate.. She is already quite active, and I encouraged her to keep up what she is doing.  I gave her some scapular stabilization to work on 3 or 4 days a week based on Dr. Vinie Sill work.  She is certainly not ready for total shoulder arthroplasty, and hopefully will never need this.  At any point if her symptoms worsen or she has a pain exacerbation, an intra-articular injection of some steroids would be a reasonable idea.  For now, she will keep up with motion and strengthening.  Follow-up: prn only  Signed,  Gerard Bonus T. Ulyssa Walthour, MD   Allergies as of 02/15/2018      Reactions   Penicillins    REACTION: rash   Sulfonamide Derivatives    REACTION: unspecified   Ciprofloxin Hcl [ciprofloxacin] Rash   itching      Medication List        Accurate as of 02/15/18 11:59 PM. Always use your most recent med list.          ACCU-CHEK AVIVA PLUS test strip Generic drug:  glucose blood CHECK BLOOD SUGAR 4 TIMES A DAY AS NEEDED   ACCU-CHEK AVIVA PLUS w/Device Kit Use to check blood sugar 4 times daily as needed.  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   atorvastatin 10 MG tablet Commonly known as:  LIPITOR TAKE ONE TABLET EVERY DAY   hydrochlorothiazide 25 MG tablet Commonly known as:  HYDRODIURIL TAKE 1 TABLET BY MOUTH DAILY   Insulin Pen Needle 31G X 8 MM Misc Commonly known as:  B-D ULTRAFINE III SHORT PEN USE AS DIRECTED   LEVEMIR FLEXTOUCH 100  UNIT/ML Pen Generic  drug:  Insulin Detemir INJECT 75 UNITS SUBQ AT BEDTIME   loratadine 10 MG tablet Commonly known as:  CLARITIN Take 1 tablet (10 mg total) by mouth daily.   METAMUCIL PO Take by mouth daily.   metFORMIN 1000 MG tablet Commonly known as:  GLUCOPHAGE TAKE ONE TABLET BY MOUTH TWICE DAILY WITH MEALS   quinapril 20 MG tablet Commonly known as:  ACCUPRIL TAKE ONE TABLET TWICE DAILY   Vitamin D 1000 units capsule Take 2,000 Units by mouth daily.

## 2018-02-17 ENCOUNTER — Encounter: Payer: Self-pay | Admitting: Family Medicine

## 2018-03-17 ENCOUNTER — Other Ambulatory Visit: Payer: Self-pay | Admitting: Family Medicine

## 2018-03-20 ENCOUNTER — Other Ambulatory Visit: Payer: Self-pay | Admitting: Family Medicine

## 2018-04-26 ENCOUNTER — Other Ambulatory Visit: Payer: Self-pay | Admitting: Family Medicine

## 2018-05-01 ENCOUNTER — Other Ambulatory Visit: Payer: Self-pay | Admitting: Family Medicine

## 2018-07-12 ENCOUNTER — Other Ambulatory Visit: Payer: Self-pay | Admitting: Family Medicine

## 2018-08-03 ENCOUNTER — Other Ambulatory Visit: Payer: Self-pay | Admitting: Family Medicine

## 2018-11-20 ENCOUNTER — Other Ambulatory Visit: Payer: Self-pay | Admitting: Family Medicine

## 2018-12-25 ENCOUNTER — Other Ambulatory Visit: Payer: Self-pay | Admitting: Family Medicine

## 2019-01-15 LAB — HM MAMMOGRAPHY

## 2019-01-22 ENCOUNTER — Other Ambulatory Visit: Payer: Self-pay | Admitting: Family Medicine

## 2019-01-23 ENCOUNTER — Encounter: Payer: Self-pay | Admitting: Family Medicine

## 2019-02-12 ENCOUNTER — Ambulatory Visit: Payer: Medicare Other

## 2019-02-23 ENCOUNTER — Encounter: Payer: Medicare Other | Admitting: Family Medicine

## 2019-05-11 ENCOUNTER — Other Ambulatory Visit: Payer: Self-pay | Admitting: Family Medicine

## 2019-05-18 ENCOUNTER — Other Ambulatory Visit: Payer: Self-pay

## 2019-05-18 MED ORDER — QUINAPRIL HCL 20 MG PO TABS: 20.0000 mg | ORAL_TABLET | Freq: Two times a day (BID) | ORAL | 0 refills | Status: DC

## 2019-06-26 ENCOUNTER — Other Ambulatory Visit: Payer: Self-pay | Admitting: Family Medicine

## 2019-06-26 DIAGNOSIS — E119 Type 2 diabetes mellitus without complications: Secondary | ICD-10-CM

## 2019-06-29 ENCOUNTER — Ambulatory Visit: Payer: Medicare Other

## 2019-06-29 ENCOUNTER — Other Ambulatory Visit (INDEPENDENT_AMBULATORY_CARE_PROVIDER_SITE_OTHER): Payer: Medicare Other

## 2019-06-29 ENCOUNTER — Ambulatory Visit (INDEPENDENT_AMBULATORY_CARE_PROVIDER_SITE_OTHER): Payer: Medicare Other

## 2019-06-29 ENCOUNTER — Other Ambulatory Visit: Payer: Self-pay

## 2019-06-29 VITALS — Ht 67.0 in | Wt 160.0 lb

## 2019-06-29 DIAGNOSIS — E119 Type 2 diabetes mellitus without complications: Secondary | ICD-10-CM | POA: Diagnosis not present

## 2019-06-29 DIAGNOSIS — Z0000 Encounter for general adult medical examination without abnormal findings: Secondary | ICD-10-CM | POA: Diagnosis not present

## 2019-06-29 LAB — COMPREHENSIVE METABOLIC PANEL
ALT: 19 U/L (ref 0–35)
AST: 20 U/L (ref 0–37)
Albumin: 4.2 g/dL (ref 3.5–5.2)
Alkaline Phosphatase: 82 U/L (ref 39–117)
BUN: 30 mg/dL — ABNORMAL HIGH (ref 6–23)
CO2: 28 mEq/L (ref 19–32)
Calcium: 10.3 mg/dL (ref 8.4–10.5)
Chloride: 103 mEq/L (ref 96–112)
Creatinine, Ser: 0.56 mg/dL (ref 0.40–1.20)
GFR: 105.89 mL/min (ref >60.00)
Glucose, Bld: 113 mg/dL — ABNORMAL HIGH (ref 70–99)
Potassium: 4.1 mEq/L (ref 3.5–5.1)
Sodium: 138 mEq/L (ref 135–145)
Total Bilirubin: 1.8 mg/dL — ABNORMAL HIGH (ref 0.2–1.2)
Total Protein: 6.5 g/dL (ref 6.0–8.3)

## 2019-06-29 LAB — LIPID PANEL
Cholesterol: 132 mg/dL (ref 0–200)
HDL: 33.2 mg/dL — ABNORMAL LOW (ref >39.00)
LDL Cholesterol: 85 mg/dL (ref 0–99)
NonHDL: 99.07
Total CHOL/HDL Ratio: 4
Triglycerides: 70 mg/dL (ref 0.0–149.0)
VLDL: 14 mg/dL (ref 0.0–40.0)

## 2019-06-29 LAB — HEMOGLOBIN A1C: Hgb A1c MFr Bld: 8.1 % — ABNORMAL HIGH (ref 4.6–6.5)

## 2019-06-29 NOTE — Progress Notes (Signed)
Subjective:   Gloria [name] is a 73 y.o. female who presents for Medicare Annual (Subsequent) preventive examination.  This visit type was conducted due to national recommendations for restrictions regarding the COVID-19 Pandemic (e.g. social distancing). This format is felt to be most appropriate for this patient at this time. All issues noted in this document were discussed and addressed. No physical exam was performed (except for noted visual exam findings with Video Visits). This patient, Gloria [name], has given permission to perform this visit via telephone. Vital signs may be absent or patient reported.  Patient location:  At home  Nurse location:  At home     Review of Systems:  n/a Cardiac Risk Factors include: advanced age (>82mn, >>75women);diabetes mellitus;hypertension     Objective:     Vitals: Ht '5\' 7"'  (1.702 m) Comment: per patient  Wt 160 lb (72.6 kg) Comment: per patient  LMP 08/08/2009   BMI 25.06 kg/m   Body mass index is 25.06 kg/m.  Advanced Directives 06/29/2019 02/02/2018  Does Patient Have a Medical Advance Directive? Yes Yes  Type of AParamedicof AFriendshipLiving will HHomesteadLiving will  Copy of HMehamain Chart? No - copy requested No - copy requested    Tobacco Social History   Tobacco Use  Smoking Status Never Smoker  Smokeless Tobacco Never Used     Counseling given: Not Answered   Clinical Intake:  Pre-visit preparation completed: Yes  Pain : No/denies pain     Nutritional Status: BMI 25 -29 Overweight Nutritional Risks: None Diabetes: Yes CBG done?: No Did pt. bring in CBG monitor from home?: No  How often do you need to have someone help you when you read instructions, pamphlets, or other written materials from your doctor or pharmacy?: 1 - Never What is the last grade level you completed in school?: Master's Education  Interpreter Needed?: No   Information entered by :: NAllen LPN  Past Medical History:  Diagnosis Date  . Allergic rhinitis   . Cardiac murmur 2011   with normal echo  . Diabetes mellitus    Type II  . Hemorrhoids   . Hyperlipidemia   . Hypertension    Past Surgical History:  Procedure Laterality Date  . ABDOMINAL HYSTERECTOMY    . BASAL CELL CARCINOMA EXCISION  08/23/2017   left upper arm  . CARPAL TUNNEL RELEASE     with bilateral releases  . CYSTECTOMY     Left breast, right lumpectomy B9  . INGUINAL HERNIA REPAIR     Right (Dr. YAnnamaria Boots  . NSVD     X 2  . TVH with vaginal prolapse  08/29/09   Transvag Tape w/Varitensor, Ant & Post Colporraphy, Uterosacral Lig susp, McCall Culdoplasty  . ueteroscopy  11/01   without laser secondary stone  . Urological procedure  10/22 - 08/31/09   [facility]   Family History  Problem Relation Age of Onset  . Hypertension Mother   . Heart disease Mother        CHF  . Breast cancer Cousin        Breast CA  . Alcohol abuse Cousin   . Stroke Maternal Aunt   . Diabetes Paternal Aunt 74  . Stroke Paternal Aunt   . Heart disease Paternal Uncle        MI  . Heart disease Paternal Uncle        MI  . Thyroid disease Other   .  Prostate cancer Son   . Colon cancer Neg Hx    Social History   Socioeconomic History  . Marital status: Married    Spouse name: Not on file  . Number of children: 2  . Years of education: Not on file  . Highest education level: Not on file  Occupational History  . Occupation: Retired Pharmacist, hospital (primary education) from the [employer]    Employer: [employer]  . Financial resource strain: Not hard at all  . Food insecurity    Worry: Never true    Inability: Never true  . Transportation needs    Medical: No    Non-medical: No  Tobacco Use  . Smoking status: Never Smoker  . Smokeless tobacco: Never Used  Substance and Sexual Activity  . Alcohol use: Yes    Alcohol/week: 2.0 - 3.0 standard drinks    Types:  2 - 3 Glasses of wine per week  . Drug use: No  . Sexual activity: Not Currently  Lifestyle  . Physical activity    Days per week: 2 days    Minutes per session: 60 min  . Stress: Not at all  Relationships  . Social Herbalist on phone: Not on file    Gets together: Not on file    Attends religious service: Not on file    Active member of club or organization: Not on file    Attends meetings of clubs or organizations: Not on file    Relationship status: Not on file  Other Topics Concern  . Not on file  Social History Narrative   Retired Pharmacist, hospital   Marital Status: Married, 1968   Children: 2 children out of the home    Outpatient Encounter Medications as of 06/29/2019  Medication Sig  . ACCU-CHEK AVIVA PLUS test strip CHECK BLOOD SUGAR FOUR TIMES DAILY AS NEEDED  . ACCU-CHEK FASTCLIX LANCETS MISC Use to check blood sugar 4 times daily as needed.  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  Marland Kitchen atorvastatin (LIPITOR) 10 MG tablet TAKE ONE TABLET BY MOUTH EVERY DAY  . Blood Glucose Monitoring Suppl (ACCU-CHEK AVIVA PLUS) W/DEVICE KIT Use to check blood sugar 4 times daily as needed.  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 Cholecalciferol (VITAMIN D) 1000 UNITS capsule Take 2,000 Units by mouth daily.   . hydrochlorothiazide (HYDRODIURIL) 25 MG tablet TAKE ONE TABLET BY MOUTH EVERY DAY  . Insulin Pen [name] (B-D ULTRAFINE III SHORT PEN) 31G X 8 MM MISC USE AS DIRECTED  ** DUE FOR OFFICE VISIT/PHYSICAL  . LEVEMIR FLEXTOUCH 100 UNIT/ML Pen INJECT 75 UNITS SUBCUTANEOUSLY AT BEDTIME  . loratadine (CLARITIN) 10 MG tablet Take 1 tablet (10 mg total) by mouth daily.  . metFORMIN (GLUCOPHAGE) 1000 MG tablet TAKE 1 TABLET BY MOUTH TWICE DAILY WITH MEALS  . Psyllium (METAMUCIL PO) Take by mouth daily.  . quinapril (ACCUPRIL) 20 MG tablet Take 1 tablet (20 mg total) by mouth 2 (two) times daily.  . vitamin C (ASCORBIC ACID) 500 MG tablet Take 500 mg by mouth daily.   No  facility-administered encounter medications on file as of 06/29/2019.     Activities of Daily Living In your present state of health, do you have any difficulty performing the following activities: 06/29/2019  Hearing? Y  Comment not as sharp  Vision? N  Difficulty concentrating or making decisions? N  Walking or climbing stairs? N  Dressing or bathing? N  Doing errands, shopping? N  Preparing Food and eating ? N  Using the Toilet? N  In the past six months, have you accidently leaked urine? Y  Comment sometimes with sneeze or waited too long  Do you have problems with loss of bowel control? N  Managing your Medications? N  Managing your Finances? N  Housekeeping or managing your Housekeeping? N  Some recent data might be hidden    Patient Care Team: Tonia Ghent, MD as PCP - General (Family Medicine)    Assessment:   This is a routine wellness examination for [place].  Exercise Activities and Dietary recommendations Current Exercise Habits: Home exercise routine, Time (Minutes): 60, Frequency (Times/Week): 2, Weekly Exercise (Minutes/Week): 120  Goals    . Increase physical activity     Starting 02/02/2018, I will continue to exercise for 40-60 minutes 4 days per week.     . Patient Stated     06/29/2019, wants to get back to walking       Fall Risk Fall Risk  06/29/2019 02/02/2018 01/25/2017 01/15/2016 01/13/2015  Falls in the past year? 0 No No No No  Risk for fall due to : Medication side effect - - - -  Follow up Falls evaluation completed;Falls prevention discussed - - - -   Is the patient's home free of loose throw rugs in walkways, pet beds, electrical cords, etc?   yes      Grab bars in the bathroom? no      Handrails on the stairs?   yes      Adequate lighting?   yes  Timed Get Up and Go performed: n/a  Depression Screen PHQ 2/9 Scores 06/29/2019 02/02/2018 01/25/2017 01/15/2016  PHQ - 2 Score 0 0 0 0  PHQ- 9 Score 0 0 - -     Cognitive Function MMSE - Mini  Mental State Exam 06/29/2019 02/02/2018  Orientation to time 5 5  Orientation to Place 5 5  Registration 3 3  Attention/ Calculation 5 0  Recall 3 3  Language- name 2 objects 0 0  Language- repeat 1 1  Language- follow 3 step command 0 3  Language- read & follow direction 0 0  Write a sentence 0 0  Copy design 0 0  Total score 22 20   Mini Cog  Mini-Cog screen was completed. Maximum score is 22. A value of 0 denotes this part of the MMSE was not completed or the patient failed this part of the Mini-Cog screening.       Immunization History  Administered Date(s) Administered  . Influenza Split 09/28/2011  . Influenza Whole 09/08/2005, 08/27/2010  . Influenza, High Dose Seasonal PF 08/21/2016  . Influenza,inj,Quad PF,6+ Mos 08/14/2018  . Influenza-Unspecified 08/01/2014, 09/19/2015, 08/30/2017  . Pneumococcal Conjugate-13 01/13/2015  . Pneumococcal Polysaccharide-23 12/24/2010  . Td 12/28/2002, 01/02/2013  . Zoster 12/24/2010    Qualifies for Shingles Vaccine? yes  Screening Tests Health Maintenance  Topic Date Due  . HEMOGLOBIN A1C  08/05/2018  . OPHTHALMOLOGY EXAM  09/01/2018  . FOOT EXAM  09/02/2018  . INFLUENZA VACCINE  06/09/2019  . COLONOSCOPY  07/07/2019  . MAMMOGRAM  01/15/2020  . TETANUS/TDAP  01/02/2023  . DEXA SCAN  Completed  . Hepatitis C Screening  Completed  . PNA vac Low Risk Adult  Completed    Cancer Screenings: Lung: Low Dose CT Chest recommended if Age 54-80 years, 30 pack-year currently smoking OR have quit w/in 15years. Patient does not qualify. Breast:  Up to date on  Mammogram? Yes   Up to date of Bone Density/Dexa? Yes Colorectal: due  Additional Screenings: : Hepatitis C Screening: 07/2015     Plan:    Patient wants to get back to walking.   I have personally reviewed and noted the following in the patient's chart:   . Medical and social history . Use of alcohol, tobacco or illicit drugs  . Current medications and supplements .  Functional ability and status . Nutritional status . Physical activity . Advanced directives . List of other physicians . Hospitalizations, surgeries, and ER visits in previous 12 months . Vitals . Screenings to include cognitive, depression, and falls . Referrals and appointments  In addition, I have reviewed and discussed with patient certain preventive protocols, quality metrics, and best practice recommendations. A written personalized care plan for preventive services as well as general preventive health recommendations were provided to patient.     Kellie Simmering, LPN  [id number]

## 2019-06-29 NOTE — Patient Instructions (Signed)
Gloria [name] , Thank you for taking time to come for your Medicare Wellness Visit. I appreciate your ongoing commitment to your health goals. Please review the following plan we discussed and let me know if I can assist you in the future.   Screening recommendations/referrals: Colonoscopy: due Mammogram: 01/2019 Bone Density: 01/2017 Recommended yearly ophthalmology/optometry visit for glaucoma screening and checkup Recommended yearly dental visit for hygiene and checkup  Vaccinations: Influenza vaccine: 08/2018 Pneumococcal vaccine: 01/2015 Tdap vaccine: 12/2012 Shingles vaccine: discussed    Advanced directives: Please bring a copy of your POA (Power of [place]) and/or Living Will to your next appointment.    Conditions/risks identified: overweight  Next appointment: 07/05/2019 at 8:30   Preventive Care 74 Years and Older, Female Preventive care refers to lifestyle choices and visits with your health care provider that can promote health and wellness. What does preventive care include?  A yearly physical exam. This is also called an annual well check.  Dental exams once or twice a year.  Routine eye exams. Ask your health care provider how often you should have your eyes checked.  Personal lifestyle choices, including:  Daily care of your teeth and gums.  Regular physical activity.  Eating a healthy diet.  Avoiding tobacco and drug use.  Limiting alcohol use.  Practicing safe sex.  Taking low-dose aspirin every day.  Taking vitamin and mineral supplements as recommended by your health care provider. What happens during an annual well check? The services and screenings done by your health care provider during your annual well check will depend on your age, overall health, lifestyle risk factors, and family history of disease. Counseling  Your health care provider may ask you questions about your:  Alcohol use.  Tobacco use.  Drug use.  Emotional well-being.   Home and relationship well-being.  Sexual activity.  Eating habits.  History of falls.  Memory and ability to understand (cognition).  Work and work Statistician.  Reproductive health. Screening  You may have the following tests or measurements:  Height, weight, and BMI.  Blood pressure.  Lipid and cholesterol levels. These may be checked every 5 years, or more frequently if you are over 32 years old.  Skin check.  Lung cancer screening. You may have this screening every year starting at age 74 if you have a 30-pack-year history of smoking and currently smoke or have quit within the past 15 years.  Fecal occult blood test (FOBT) of the stool. You may have this test every year starting at age 74.  Flexible sigmoidoscopy or colonoscopy. You may have a sigmoidoscopy every 5 years or a colonoscopy every 10 years starting at age 35.  Hepatitis C blood test.  Hepatitis B blood test.  Sexually transmitted disease (STD) testing.  Diabetes screening. This is done by checking your blood sugar (glucose) after you have not eaten for a while (fasting). You may have this done every 1-3 years.  Bone density scan. This is done to screen for osteoporosis. You may have this done starting at age 74.  Mammogram. This may be done every 1-2 years. Talk to your health care provider about how often you should have regular mammograms. Talk with your health care provider about your test results, treatment options, and if necessary, the need for more tests. Vaccines  Your health care provider may recommend certain vaccines, such as:  Influenza vaccine. This is recommended every year.  Tetanus, diphtheria, and acellular pertussis (Tdap, Td) vaccine. You may need a Td booster  every 10 years.  Zoster vaccine. You may need this after age 74.  Pneumococcal 13-valent conjugate (PCV13) vaccine. One dose is recommended after age 74.  Pneumococcal polysaccharide (PPSV23) vaccine. One dose is  recommended after age 74. Talk to your health care provider about which screenings and vaccines you need and how often you need them. This information is not intended to replace advice given to you by your health care provider. Make sure you discuss any questions you have with your health care provider. Document Released: 11/21/2015 Document Revised: 07/14/2016 Document Reviewed: 08/26/2015 Elsevier Interactive Patient Education  2017 [place] Prevention in the Home Falls can cause injuries. They can happen to people of all ages. There are many things you can do to make your home safe and to help prevent falls. What can I do on the outside of my home?  Regularly fix the edges of walkways and driveways and fix any cracks.  Remove anything that might make you trip as you walk through a door, such as a raised step or threshold.  Trim any bushes or trees on the path to your home.  Use bright outdoor lighting.  Clear any walking paths of anything that might make someone trip, such as rocks or tools.  Regularly check to see if handrails are loose or broken. Make sure that both sides of any steps have handrails.  Any raised decks and porches should have guardrails on the edges.  Have any leaves, snow, or ice cleared regularly.  Use sand or salt on walking paths during winter.  Clean up any spills in your garage right away. This includes oil or grease spills. What can I do in the bathroom?  Use night lights.  Install grab bars by the toilet and in the tub and shower. Do not use towel bars as grab bars.  Use non-skid mats or decals in the tub or shower.  If you need to sit down in the shower, use a plastic, non-slip stool.  Keep the floor dry. Clean up any water that spills on the floor as soon as it happens.  Remove soap buildup in the tub or shower regularly.  Attach bath mats securely with double-sided non-slip rug tape.  Do not have throw rugs and other things on  the floor that can make you trip. What can I do in the bedroom?  Use night lights.  Make sure that you have a light by your bed that is easy to reach.  Do not use any sheets or blankets that are too big for your bed. They should not hang down onto the floor.  Have a firm chair that has side arms. You can use this for support while you get dressed.  Do not have throw rugs and other things on the floor that can make you trip. What can I do in the kitchen?  Clean up any spills right away.  Avoid walking on wet floors.  Keep items that you use a lot in easy-to-reach places.  If you need to reach something above you, use a strong step stool that has a grab bar.  Keep electrical cords out of the way.  Do not use floor polish or wax that makes floors slippery. If you must use wax, use non-skid floor wax.  Do not have throw rugs and other things on the floor that can make you trip. What can I do with my stairs?  Do not leave any items on the stairs.  Make  sure that there are handrails on both sides of the stairs and use them. Fix handrails that are broken or loose. Make sure that handrails are as long as the stairways.  Check any carpeting to make sure that it is firmly attached to the stairs. Fix any carpet that is loose or worn.  Avoid having throw rugs at the top or bottom of the stairs. If you do have throw rugs, attach them to the floor with carpet tape.  Make sure that you have a light switch at the top of the stairs and the bottom of the stairs. If you do not have them, ask someone to add them for you. What else can I do to help prevent falls?  Wear shoes that:  Do not have high heels.  Have rubber bottoms.  Are comfortable and fit you well.  Are closed at the toe. Do not wear sandals.  If you use a stepladder:  Make sure that it is fully opened. Do not climb a closed stepladder.  Make sure that both sides of the stepladder are locked into place.  Ask someone to  hold it for you, if possible.  Clearly mark and make sure that you can see:  Any grab bars or handrails.  First and last steps.  Where the edge of each step is.  Use tools that help you move around (mobility aids) if they are needed. These include:  Canes.  Walkers.  Scooters.  Crutches.  Turn on the lights when you go into a dark area. Replace any light bulbs as soon as they burn out.  Set up your furniture so you have a clear path. Avoid moving your furniture around.  If any of your floors are uneven, fix them.  If there are any pets around you, be aware of where they are.  Review your medicines with your doctor. Some medicines can make you feel dizzy. This can increase your chance of falling. Ask your doctor what other things that you can do to help prevent falls. This information is not intended to replace advice given to you by your health care provider. Make sure you discuss any questions you have with your health care provider. Document Released: 08/21/2009 Document Revised: 04/01/2016 Document Reviewed: 11/29/2014 Elsevier Interactive Patient Education  2017 [employer].

## 2019-06-29 NOTE — Progress Notes (Signed)
PCP notes:  Health Maintenance:  Foot exam and eye exam are due. (appointment for eye exam in November)  Abnormal Screenings:  None  Patient concerns:  Would like to get ears checked for wax.  Nurse concerns:  None  Next PCP appt.: 07/05/2019 at 8:30

## 2019-07-05 ENCOUNTER — Encounter: Payer: Self-pay | Admitting: Family Medicine

## 2019-07-05 ENCOUNTER — Ambulatory Visit (INDEPENDENT_AMBULATORY_CARE_PROVIDER_SITE_OTHER): Payer: Medicare Other | Admitting: Family Medicine

## 2019-07-05 ENCOUNTER — Other Ambulatory Visit: Payer: Self-pay

## 2019-07-05 VITALS — BP 118/62 | HR 75 | Temp 98.0°F | Ht 67.0 in

## 2019-07-05 DIAGNOSIS — M25512 Pain in left shoulder: Secondary | ICD-10-CM

## 2019-07-05 DIAGNOSIS — E119 Type 2 diabetes mellitus without complications: Secondary | ICD-10-CM | POA: Diagnosis not present

## 2019-07-05 DIAGNOSIS — Z0000 Encounter for general adult medical examination without abnormal findings: Secondary | ICD-10-CM

## 2019-07-05 DIAGNOSIS — Z1211 Encounter for screening for malignant neoplasm of colon: Secondary | ICD-10-CM

## 2019-07-05 DIAGNOSIS — I10 Essential (primary) hypertension: Secondary | ICD-10-CM

## 2019-07-05 DIAGNOSIS — R011 Cardiac murmur, unspecified: Secondary | ICD-10-CM

## 2019-07-05 DIAGNOSIS — Z7189 Other specified counseling: Secondary | ICD-10-CM

## 2019-07-05 DIAGNOSIS — E7800 Pure hypercholesterolemia, unspecified: Secondary | ICD-10-CM | POA: Diagnosis not present

## 2019-07-05 NOTE — Progress Notes (Signed)
Vaccines discussed with patient. shingrix d/w pt.   Mammogram- 2020 Colonoscopy 2017, she wanted to be referred back to Dr. Hipolito Bayley at [facility].  DXA 2016.  Okay to defer until 2021 Diet and exercise d/w pt.   Advance directive d/w pt- husband designated if patient were incapacitated.    She is putting up with shoulder pain.  Home exercise helps.  She will update me as needed.  She is doing zoom exercise now.  Pandemic considerations d/w pt.   She wanted her ears checked for wax.  She [place]  Less sx recently.  She has taken claritin prn in the meantime.    Diabetes:  Using medications without difficulties: yes Hypoglycemic episodes: see below, cautions d/w pt.  Hyperglycemic episodes:no Feet problems:not except for some itching occ on the R 3rd toe.  Blood Sugars averaging: 105-115 in the AM, higher later in the day.  She has tendency for lows overnight if she doesn't take a snack at bedtime if sugar is already <200.  D/w pt about avoiding lows. eye exam within last year: due in 09/2019, pending/scheduled.  A1c d/w pt.    Hypertension:    Using medication without problems or lightheadedness: yes Chest pain with exertion:no Edema:no Short of breath:no  Elevated Cholesterol: Using medications without problems:yes Muscle aches: no Diet compliance: yes Exercise:yes  PMH and SH reviewed  Meds, vitals, and allergies reviewed.   ROS: Per HPI unless specifically indicated in ROS section   GEN: nad, alert and oriented HEENT: ncat, TM wnl B without erythema.  No ear canal wax bilaterally. NECK: supple w/o LA CV: rrr.  Systolic murmur noted.  Old finding. PULM: ctab, no inc wob ABD: soft, +bs EXT: no edema SKIN: no acute rash  Diabetic foot exam: Normal inspection No skin breakdown No calluses  Normal DP pulses Normal sensation to light touch and monofilament Nails normal

## 2019-07-05 NOTE — Patient Instructions (Addendum)
Check with your insurance to see if they will cover the shingrix shot. Flu shot today.  I would take claritin during/prior to travel [place] the [place].   Recheck in about 3 months, a1c at the visit.  We'll call about [facility].  Take care.  Glad to see you.

## 2019-07-08 NOTE — Assessment & Plan Note (Signed)
Controlled.  Labs discussed with patient.  No change in meds.  Continue work on diet and exercise.

## 2019-07-08 NOTE — Assessment & Plan Note (Signed)
Noted again on exam.  Previous echo done.  No symptoms.  No further work-up needed at this point.

## 2019-07-08 NOTE — Assessment & Plan Note (Signed)
Vaccines discussed with patient. shingrix d/w pt.   Mammogram- 2020 Colonoscopy 2017, she wanted to be referred back to Dr. Hipolito Bayley at [facility].  DXA 2016.  Okay to defer until 2021 Diet and exercise d/w pt.   Advance directive d/w pt- husband designated if patient were incapacitated.

## 2019-07-08 NOTE — Assessment & Plan Note (Signed)
Continue home exercise plan and update me as needed.  She agrees.

## 2019-07-08 NOTE — Assessment & Plan Note (Signed)
Blood Sugars averaging: 105-115 in the AM, higher later in the day.  She has tendency for lows overnight if she doesn't take a snack at bedtime if sugar is already <200.  D/w pt about avoiding lows. eye exam within last year: due in 09/2019, pending/scheduled.  A1c d/w pt.   Recheck in about 3 months.  A1c at the visit.  Continue work on diet and exercise in the meantime.

## 2019-07-08 NOTE — Assessment & Plan Note (Signed)
Advance directive d/w pt- husband designated if patient were incapacitated.   ?

## 2019-08-22 ENCOUNTER — Other Ambulatory Visit: Payer: Self-pay | Admitting: Family Medicine

## 2019-09-12 ENCOUNTER — Other Ambulatory Visit: Payer: Self-pay | Admitting: Family Medicine

## 2019-09-13 ENCOUNTER — Other Ambulatory Visit: Payer: Self-pay | Admitting: Family Medicine

## 2019-09-20 LAB — HM DIABETES EYE EXAM

## 2019-10-01 ENCOUNTER — Other Ambulatory Visit: Payer: Self-pay

## 2019-10-01 ENCOUNTER — Ambulatory Visit (INDEPENDENT_AMBULATORY_CARE_PROVIDER_SITE_OTHER): Payer: Medicare Other | Admitting: Family Medicine

## 2019-10-01 ENCOUNTER — Encounter: Payer: Self-pay | Admitting: Family Medicine

## 2019-10-01 VITALS — BP 130/72 | HR 70 | Temp 96.9°F | Ht 67.0 in | Wt 158.0 lb

## 2019-10-01 DIAGNOSIS — E119 Type 2 diabetes mellitus without complications: Secondary | ICD-10-CM

## 2019-10-01 LAB — POCT GLYCOSYLATED HEMOGLOBIN (HGB A1C): Hemoglobin A1C: 7.5 % — AB (ref 4.0–5.6)

## 2019-10-01 MED ORDER — LEVEMIR FLEXTOUCH 100 UNIT/ML ~~LOC~~ SOPN: PEN_INJECTOR | SUBCUTANEOUS | Status: DC

## 2019-10-01 NOTE — Patient Instructions (Addendum)
Cut your insulin back by 1 unit per day until your AM sugar is ~120.   Recheck in about about 3 months with A1c at the visit.   If you have a very active day with less intake, then temporarily cut back 5 units.    Give 50% dose the night prior to colonoscopy.  Take care.  Glad to see you.   A1c 7.5.

## 2019-10-01 NOTE — Progress Notes (Signed)
This visit occurred during the SARS-CoV-2 public health emergency.  Safety protocols were in place, including screening questions prior to the visit, additional usage of staff PPE, and extensive cleaning of exam room while observing appropriate contact time as indicated for disinfecting solutions.   Diabetes:  Using medications without difficulties: yes Hypoglycemic episodes: down to 63 this AM, corrected with a snack, this is rare.  She is aware when it happens. She has been really active recently.  No change in insulin dose.   Hyperglycemic episodes:no Feet problems:no tingling.   Blood Sugars averaging: usually ~100 usually.   eye exam within last year: yes Has been taking 73 units of insulin a day.  D/w pt.   GI eval pending.  She had derm f/u with Dr. Manley Mason.    Meds, vitals, and allergies reviewed.   ROS: Per HPI unless specifically indicated in ROS section   GEN: nad, alert and oriented HEENT: ncat NECK: supple w/o LA CV: rrr. PULM: ctab, no inc wob ABD: soft, +bs EXT: no edema SKIN: no acute rash

## 2019-10-03 NOTE — Assessment & Plan Note (Signed)
Discussed options. Reasonable to cut insulin back by 1 unit per day until AM sugar is ~120.   Recheck in about about 3 months with A1c at the visit.  If a very active day with less intake, then temporarily cut back 5 units.   Reasonable to give 50% dose the night prior to colonoscopy.  A1c 7.5.  Discussed with patient at office visit.  She agrees to plan.

## 2019-10-19 LAB — HM COLONOSCOPY

## 2019-10-22 ENCOUNTER — Encounter: Payer: Self-pay | Admitting: Family Medicine

## 2019-11-21 DIAGNOSIS — D044 Carcinoma in situ of skin of scalp and neck: Secondary | ICD-10-CM | POA: Diagnosis not present

## 2019-12-07 ENCOUNTER — Ambulatory Visit: Payer: Medicare Other

## 2019-12-13 ENCOUNTER — Ambulatory Visit: Payer: Medicare PPO | Attending: Internal Medicine

## 2019-12-13 DIAGNOSIS — Z23 Encounter for immunization: Secondary | ICD-10-CM

## 2019-12-13 NOTE — Progress Notes (Signed)
   Covid-19 Vaccination Clinic  Name:  Gloria [name]    MRN: [id number] DOB: 02-23-1946  12/13/2019  Gloria [name] was observed post Covid-19 immunization for 15 minutes without incidence. She was provided with Vaccine Information Sheet and instruction to access the V-Safe system.   Gloria [name] was instructed to call 911 with any severe reactions post vaccine: Marland Kitchen Difficulty breathing  . Swelling of your face and throat  . A fast heartbeat  . A bad rash all over your body  . Dizziness and weakness    Immunizations Administered    Name Date Dose VIS Date Route   Pfizer COVID-19 Vaccine 12/13/2019 11:44 AM 0.3 mL 10/19/2019 Intramuscular   Manufacturer: [place]   Lot: [id number]   [place]: [id number]

## 2019-12-23 ENCOUNTER — Ambulatory Visit: Payer: Medicare Other

## 2019-12-24 ENCOUNTER — Ambulatory Visit: Payer: Medicare Other

## 2020-01-01 ENCOUNTER — Encounter: Payer: Self-pay | Admitting: Family Medicine

## 2020-01-01 ENCOUNTER — Ambulatory Visit: Payer: Medicare PPO | Admitting: Family Medicine

## 2020-01-01 ENCOUNTER — Other Ambulatory Visit: Payer: Self-pay

## 2020-01-01 VITALS — BP 120/66 | HR 75 | Temp 97.0°F | Ht 67.0 in | Wt 155.2 lb

## 2020-01-01 DIAGNOSIS — R0683 Snoring: Secondary | ICD-10-CM

## 2020-01-01 DIAGNOSIS — R0602 Shortness of breath: Secondary | ICD-10-CM

## 2020-01-01 DIAGNOSIS — E119 Type 2 diabetes mellitus without complications: Secondary | ICD-10-CM

## 2020-01-01 LAB — CBC WITH DIFFERENTIAL/PLATELET
Basophils Absolute: 0 10*3/uL (ref 0.0–0.1)
Basophils Relative: 0.6 % (ref 0.0–3.0)
Eosinophils Absolute: 0.1 10*3/uL (ref 0.0–0.7)
Eosinophils Relative: 4.1 % (ref 0.0–5.0)
HCT: 42.3 % (ref 36.0–46.0)
Hemoglobin: 14.5 g/dL (ref 12.0–15.0)
Lymphocytes Relative: 36.2 % (ref 12.0–46.0)
Lymphs Abs: 1.3 10*3/uL (ref 0.7–4.0)
MCHC: 34.2 g/dL (ref 30.0–36.0)
MCV: 91.1 fl (ref 78.0–100.0)
Monocytes Absolute: 0.3 10*3/uL (ref 0.1–1.0)
Monocytes Relative: 9.4 % (ref 3.0–12.0)
Neutro Abs: 1.8 10*3/uL (ref 1.4–7.7)
Neutrophils Relative %: 49.7 % (ref 43.0–77.0)
Platelets: 179 10*3/uL (ref 150.0–400.0)
RBC: 4.65 Mil/uL (ref 3.87–5.11)
RDW: 13.3 % (ref 11.5–15.5)
WBC: 3.5 10*3/uL — ABNORMAL LOW (ref 4.0–10.5)

## 2020-01-01 LAB — COMPREHENSIVE METABOLIC PANEL
ALT: 16 U/L (ref 0–35)
AST: 14 U/L (ref 0–37)
Albumin: 4.3 g/dL (ref 3.5–5.2)
Alkaline Phosphatase: 93 U/L (ref 39–117)
BUN: 17 mg/dL (ref 6–23)
CO2: 30 mEq/L (ref 19–32)
Calcium: 10.8 mg/dL — ABNORMAL HIGH (ref 8.4–10.5)
Chloride: 103 mEq/L (ref 96–112)
Creatinine, Ser: 0.49 mg/dL (ref 0.40–1.20)
GFR: 123.35 mL/min (ref >60.00)
Glucose, Bld: 150 mg/dL — ABNORMAL HIGH (ref 70–99)
Potassium: 4.5 mEq/L (ref 3.5–5.1)
Sodium: 139 mEq/L (ref 135–145)
Total Bilirubin: 1.6 mg/dL — ABNORMAL HIGH (ref 0.2–1.2)
Total Protein: 6.5 g/dL (ref 6.0–8.3)

## 2020-01-01 LAB — LIPID PANEL
Cholesterol: 119 mg/dL (ref 0–200)
HDL: 32.7 mg/dL — ABNORMAL LOW (ref >39.00)
LDL Cholesterol: 74 mg/dL (ref 0–99)
NonHDL: 86.13
Total CHOL/HDL Ratio: 4
Triglycerides: 61 mg/dL (ref 0.0–149.0)
VLDL: 12.2 mg/dL (ref 0.0–40.0)

## 2020-01-01 LAB — POCT GLYCOSYLATED HEMOGLOBIN (HGB A1C): Hemoglobin A1C: 7.4 % — AB (ref 4.0–5.6)

## 2020-01-01 LAB — TSH: TSH: 0.58 u[IU]/mL (ref 0.35–4.50)

## 2020-01-01 MED ORDER — LEVEMIR FLEXTOUCH 100 UNIT/ML ~~LOC~~ SOPN: PEN_INJECTOR | SUBCUTANEOUS | Status: DC

## 2020-01-01 NOTE — Patient Instructions (Addendum)
Limit exertion for now.  We'll call about seeing pulmonary and cardiology.  Go to the lab on the way out.   If you have mychart we'll likely use that to update you.    Take care.  Glad to see you. Plan on recheck in 3 months with A1c at the visit.  If you have chest pain or more shortness of breath, then go to the ER.

## 2020-01-01 NOTE — Progress Notes (Signed)
This visit occurred during the SARS-CoV-2 public health emergency.  Safety protocols were in place, including screening questions prior to the visit, additional usage of staff PPE, and extensive cleaning of exam room while observing appropriate contact time as indicated for disinfecting solutions.  Diabetes:  Using medications without difficulties:no Hypoglycemic episodes:no Hyperglycemic episodes: no Feet problems: dry skin but o/w doing well.   Blood Sugars averaging: max 160 in the AM, usually 100-120s.   eye exam within last year: yes On less insulin now, down to 66 units.   A1c today 7.4.  Discussed with patient.  She was told by GI clinic that she could have sleep apnea, during her colonoscopy.  She didn't know of troubles otherwise with OSA.  Not waking gasping for air but she has h/o snoring.  No AM HA.  Basics of sleep apnea pathophysiology discussed with patient.  She had an episode of dec exercise tolerance while walking with her daughter in the neighborhood.  This was back in 08/2019.  She was likely walking at a faster pace than usual at the time.  She got SOB w/o CP.  She was able to walk normally o/w until a similar episode about 1 month ago.  She does better at a slower pace. No CP but her chest felt tight at the time.  No CP or tightness o/w.  Sleeping on 1 pillow.  No chest pain currently.  See notes on labs.  Meds, vitals, and allergies reviewed.   ROS: Per HPI unless specifically indicated in ROS section   GEN: nad, alert and oriented HEENT: ncat NECK: supple w/o LA CV: rrr. PULM: ctab, no inc wob ABD: soft, +bs EXT: no edema SKIN: no acute rash  EKG discussed with patient at office visit.

## 2020-01-02 DIAGNOSIS — R0602 Shortness of breath: Secondary | ICD-10-CM | POA: Insufficient documentation

## 2020-01-02 DIAGNOSIS — R0683 Snoring: Secondary | ICD-10-CM

## 2020-01-02 HISTORY — DX: Snoring: R06.83

## 2020-01-02 HISTORY — DX: Shortness of breath: R06.02

## 2020-01-02 NOTE — Assessment & Plan Note (Signed)
She had these episodes of exertional shortness of breath, once with chest tightness.  No symptoms now.  EKG without acute changes.  Discussed options.  Refer to cardiology as an outpatient.  Limit exertion in the meantime.  If she has new shortness of breath or chest pain then I want her to go to the emergency room.  She agrees.

## 2020-01-02 NOTE — Assessment & Plan Note (Signed)
A1c reasonable.  No change in meds at this point.  Labs discussed with patient.

## 2020-01-02 NOTE — Assessment & Plan Note (Signed)
Refer for sleep apnea testing.

## 2020-01-04 ENCOUNTER — Encounter: Payer: Self-pay | Admitting: Family Medicine

## 2020-01-07 ENCOUNTER — Ambulatory Visit: Payer: Medicare PPO | Attending: Internal Medicine

## 2020-01-07 DIAGNOSIS — Z23 Encounter for immunization: Secondary | ICD-10-CM | POA: Insufficient documentation

## 2020-01-07 NOTE — Progress Notes (Signed)
   Covid-19 Vaccination Clinic  Name:  SARYNA STREMEL    MRN: [id number] DOB: [birth year]  01/07/2020  Ms. Basinski was observed post Covid-19 immunization for 15 minutes without incidence. She was provided with Vaccine Information Sheet and instruction to access the V-Safe system.   Ms. Bataille was instructed to call 911 with any severe reactions post vaccine: Marland Kitchen Difficulty breathing  . Swelling of your face and throat  . A fast heartbeat  . A bad rash all over your body  . Dizziness and weakness    Immunizations Administered    Name Date Dose VIS Date Route   Pfizer COVID-19 Vaccine 01/07/2020 10:55 AM 0.3 mL 10/19/2019 Intramuscular   Manufacturer: [place]   Lot: [id number]   [place]: [id number]

## 2020-01-08 ENCOUNTER — Other Ambulatory Visit: Payer: Self-pay | Admitting: Family Medicine

## 2020-01-22 ENCOUNTER — Ambulatory Visit: Payer: Medicare PPO | Admitting: Pulmonary Disease

## 2020-01-22 ENCOUNTER — Encounter: Payer: Self-pay | Admitting: Pulmonary Disease

## 2020-01-22 ENCOUNTER — Other Ambulatory Visit: Payer: Self-pay

## 2020-01-22 VITALS — BP 132/72 | HR 79 | Temp 97.4°F | Ht 66.0 in | Wt 157.0 lb

## 2020-01-22 DIAGNOSIS — G4733 Obstructive sleep apnea (adult) (pediatric): Secondary | ICD-10-CM

## 2020-01-22 NOTE — Progress Notes (Signed)
Subjective:    Patient ID: Gloria [name], female    DOB: 10-13-1946, 74 y.o.   MRN: [id number]  Patient being seen for possible obstructive sleep apnea  She was observed to have apneas during a recent colonoscopy  She feels her sleeping is fair Not able to sleep well on certain days Usually goes to bed between 10 and 11 10 to 15 minutes to fall asleep Up to 3 awakenings Final awakening time is about 630  Does have occasional dryness of the mouth in the mornings Denies dryness of the mouth Memory is good No night sweats No gasping respirations at night  She does have occasional snoring  No family history of obstructive sleep apnea known to her  Past Medical History:  Diagnosis Date  . Allergic rhinitis   . Cardiac murmur 2011   with normal echo  . Diabetes mellitus    Type II  . Hemorrhoids   . Hyperlipidemia   . Hypertension    Social History   Socioeconomic History  . Marital status: Married    Spouse name: Not on file  . Number of children: 2  . Years of education: Not on file  . Highest education level: Not on file  Occupational History  . Occupation: Retired Pharmacist, hospital (primary education) from the [employer]    Employer: RETIRED  Tobacco Use  . Smoking status: Never Smoker  . Smokeless tobacco: Never Used  Substance and Sexual Activity  . Alcohol use: Yes    Alcohol/week: 2.0 - 3.0 standard drinks    Types: 2 - 3 Glasses of wine per week  . Drug use: No  . Sexual activity: Not Currently  Other Topics Concern  . Not on file  Social History Narrative   Retired Pharmacist, hospital   Marital Status: Married, 1968   Children: 2 children out of the home   Social Determinants of Health   Financial Resource Strain: Low Risk   . Difficulty of Paying Living Expenses: Not hard at all  Food Insecurity: No Food Insecurity  . Worried About Charity fundraiser in the Last Year: Never true  . Ran Out of Food in the Last Year: Never true  Transportation  Needs: No Transportation Needs  . Lack of Transportation (Medical): No  . Lack of Transportation (Non-Medical): No  Physical Activity: Insufficiently Active  . Days of Exercise per Week: 2 days  . Minutes of Exercise per Session: 60 min  Stress: No Stress Concern Present  . Feeling of Stress : Not at all  Social Connections:   . Frequency of Communication with Friends and Family:   . Frequency of Social Gatherings with Friends and Family:   . Attends Religious Services:   . Active Member of Clubs or Organizations:   . Attends Archivist Meetings:   Marland Kitchen Marital Status:   Intimate Partner Violence: Unknown  . Fear of Current or Ex-Partner: No  . Emotionally Abused: No  . Physically Abused: No  . Sexually Abused: Not on file   Family History  Problem Relation Age of Onset  . Hypertension Mother   . Heart disease Mother        CHF  . Breast cancer Cousin        Breast CA  . Alcohol abuse Cousin   . Stroke Maternal Aunt   . Diabetes Paternal Aunt 74  . Stroke Paternal Aunt   . Heart disease Paternal Uncle        MI  .  Heart disease Paternal Uncle        MI  . Thyroid disease Other   . Prostate cancer Son   . Colon cancer Neg Hx    Review of Systems  Constitutional: Negative for fever and unexpected weight change.  HENT: Positive for congestion and rhinorrhea. Negative for dental problem, ear pain, nosebleeds, postnasal drip, sinus pressure, sneezing, sore throat and trouble swallowing.   Eyes: Positive for redness. Negative for itching.  Respiratory: Positive for chest tightness and shortness of breath. Negative for cough and wheezing.   Cardiovascular: Negative for palpitations and leg swelling.  Gastrointestinal: Negative for nausea and vomiting.  Genitourinary: Positive for dysuria.  Musculoskeletal: Negative for joint swelling.  Skin: Negative for rash.  Allergic/Immunologic: Negative.  Negative for environmental allergies, food allergies and immunocompromised  state.  Neurological: Negative for headaches.  Hematological: Does not bruise/bleed easily.  Psychiatric/Behavioral: Negative for dysphoric mood. The patient is not nervous/anxious.       Objective:   Physical Exam Constitutional:      Appearance: Normal appearance.  HENT:     Head: Normocephalic and atraumatic.     Nose: Nose normal. No congestion or rhinorrhea.     Mouth/Throat:     Mouth: Mucous membranes are moist.     Comments: Mallampati 3, crowded oropharynx, macroglossia Eyes:     Extraocular Movements: Extraocular movements intact.     Pupils: Pupils are equal, round, and reactive to light.  Cardiovascular:     Rate and Rhythm: Normal rate and regular rhythm.     Pulses: Normal pulses.     Heart sounds: Normal heart sounds. No murmur.  Pulmonary:     Effort: Pulmonary effort is normal. No respiratory distress.     Breath sounds: Normal breath sounds. No stridor. No wheezing or rhonchi.  Musculoskeletal:     Cervical back: Normal range of motion. No rigidity or tenderness.  Neurological:     General: No focal deficit present.     Mental Status: She is alert.  Psychiatric:        Mood and Affect: Mood normal.    Vitals:   01/22/20 1459  BP: 132/72  Pulse: 79  Temp: (!) 97.4 F (36.3 C)  SpO2: 96%   Results of the Epworth flowsheet 01/22/2020  Sitting and reading 1  Watching TV 0  Sitting, inactive in a public place (e.g. a theatre or a meeting) 1  As a passenger in a car for an hour without a break 3  Lying down to rest in the afternoon when circumstances permit 3  Sitting and talking to someone 0  Sitting quietly after a lunch without alcohol 1  In a car, while stopped for a few minutes in traffic 0  Total score 9      Assessment & Plan:  .  Moderate probability of significant obstructive sleep apnea .  Nonrestorative sleep .  Excessive daytime sleepiness with an Epworth Sleepiness Scale of 9  Pathophysiology of sleep disordered breathing discussed  with the patient Treatment options for sleep disordered breathing discussed with the patient  Plan: .  We will schedule patient for home sleep study .  Treatment options as discussed .  I will follow-up with her in about 3 months .  An oral device would be an option of treatment if patient so chooses if she has significant obstructive sleep apnea  .  Encouraged to call with any significant concerns

## 2020-01-22 NOTE — Patient Instructions (Signed)
Moderate probability of significant sleep apnea as you were witnessed to have apneas during sleep  -We will schedule a home sleep study  -Treatment options as discussed if it is found to be significant  -We will follow-up with you in about 3 months  -Call with significant concerns   Sleep Apnea Sleep apnea is a condition in which breathing pauses or becomes shallow during sleep. Episodes of sleep apnea usually last 10 seconds or longer, and they may occur as many as 20 times an hour. Sleep apnea disrupts your sleep and keeps your body from getting the rest that it needs. This condition can increase your risk of certain health problems, including:  Heart attack.  Stroke.  Obesity.  Diabetes.  Heart failure.  Irregular heartbeat. What are the causes? There are three kinds of sleep apnea:  Obstructive sleep apnea. This kind is caused by a blocked or collapsed airway.  Central sleep apnea. This kind happens when the part of the brain that controls breathing does not send the correct signals to the muscles that control breathing.  Mixed sleep apnea. This is a combination of obstructive and central sleep apnea. The most common cause of this condition is a collapsed or blocked airway. An airway can collapse or become blocked if:  Your throat muscles are abnormally relaxed.  Your tongue and tonsils are larger than normal.  You are overweight.  Your airway is smaller than normal. What increases the risk? You are more likely to develop this condition if you:  Are overweight.  Smoke.  Have a smaller than normal airway.  Are elderly.  Are female.  Drink alcohol.  Take sedatives or tranquilizers.  Have a family history of sleep apnea. What are the signs or symptoms? Symptoms of this condition include:  Trouble staying asleep.  Daytime sleepiness and tiredness.  Irritability.  Loud snoring.  Morning headaches.  Trouble  concentrating.  Forgetfulness.  Decreased interest in sex.  Unexplained sleepiness.  Mood swings.  Personality changes.  Feelings of depression.  Waking up often during the night to urinate.  Dry mouth.  Sore throat. How is this diagnosed? This condition may be diagnosed with:  A medical history.  A physical exam.  A series of tests that are done while you are sleeping (sleep study). These tests are usually done in a sleep lab, but they may also be done at home. How is this treated? Treatment for this condition aims to restore normal breathing and to ease symptoms during sleep. It may involve managing health issues that can affect breathing, such as high blood pressure or obesity. Treatment may include:  Sleeping on your side.  Using a decongestant if you have nasal congestion.  Avoiding the use of depressants, including alcohol, sedatives, and narcotics.  Losing weight if you are overweight.  Making changes to your diet.  Quitting smoking.  Using a device to open your airway while you sleep, such as: ? An oral appliance. This is a custom-made mouthpiece that shifts your lower jaw forward. ? A continuous positive airway pressure (CPAP) device. This device blows air through a mask when you breathe out (exhale). ? A nasal expiratory positive airway pressure (EPAP) device. This device has valves that you put into each nostril. ? A bi-level positive airway pressure (BPAP) device. This device blows air through a mask when you breathe in (inhale) and breathe out (exhale).  Having surgery if other treatments do not work. During surgery, excess tissue is removed to create a wider  airway. It is important to get treatment for sleep apnea. Without treatment, this condition can lead to:  High blood pressure.  Coronary artery disease.  In men, an inability to achieve or maintain an erection (impotence).  Reduced thinking abilities. Follow these instructions at  home: Lifestyle  Make any lifestyle changes that your health care provider recommends.  Eat a healthy, well-balanced diet.  Take steps to lose weight if you are overweight.  Avoid using depressants, including alcohol, sedatives, and narcotics.  Do not use any products that contain nicotine or tobacco, such as cigarettes, e-cigarettes, and chewing tobacco. If you need help quitting, ask your health care provider. General instructions  Take over-the-counter and prescription medicines only as told by your health care provider.  If you were given a device to open your airway while you sleep, use it only as told by your health care provider.  If you are having surgery, make sure to tell your health care provider you have sleep apnea. You may need to bring your device with you.  Keep all follow-up visits as told by your health care provider. This is important. Contact a health care provider if:  The device that you received to open your airway during sleep is uncomfortable or does not seem to be working.  Your symptoms do not improve.  Your symptoms get worse. Get help right away if:  You develop: ? Chest pain. ? Shortness of breath. ? Discomfort in your back, arms, or stomach.  You have: ? Trouble speaking. ? Weakness on one side of your body. ? Drooping in your face. These symptoms may represent a serious problem that is an emergency. Do not wait to see if the symptoms will go away. Get medical help right away. Call your local emergency services (911 in the [place] Do not drive yourself to the hospital. Summary  Sleep apnea is a condition in which breathing pauses or becomes shallow during sleep.  The most common cause is a collapsed or blocked airway.  The goal of treatment is to restore normal breathing and to ease symptoms during sleep. This information is not intended to replace advice given to you by your health care provider. Make sure you discuss any questions you  have with your health care provider. Document Revised: 04/11/2019 Document Reviewed: 06/20/2018 Else[employer] Patient Education  [employer].

## 2020-01-30 ENCOUNTER — Other Ambulatory Visit: Payer: Self-pay | Admitting: Family Medicine

## 2020-02-04 ENCOUNTER — Other Ambulatory Visit: Payer: Self-pay

## 2020-02-04 ENCOUNTER — Ambulatory Visit: Payer: Medicare PPO

## 2020-02-04 DIAGNOSIS — G4733 Obstructive sleep apnea (adult) (pediatric): Secondary | ICD-10-CM

## 2020-02-05 ENCOUNTER — Encounter: Payer: Self-pay | Admitting: Cardiology

## 2020-02-05 ENCOUNTER — Ambulatory Visit: Payer: Medicare PPO | Admitting: Cardiology

## 2020-02-05 VITALS — BP 120/70 | HR 81 | Ht 66.0 in | Wt 155.2 lb

## 2020-02-05 DIAGNOSIS — R011 Cardiac murmur, unspecified: Secondary | ICD-10-CM | POA: Diagnosis not present

## 2020-02-05 DIAGNOSIS — I209 Angina pectoris, unspecified: Secondary | ICD-10-CM

## 2020-02-05 DIAGNOSIS — R0600 Dyspnea, unspecified: Secondary | ICD-10-CM | POA: Diagnosis not present

## 2020-02-05 DIAGNOSIS — I10 Essential (primary) hypertension: Secondary | ICD-10-CM

## 2020-02-05 DIAGNOSIS — E119 Type 2 diabetes mellitus without complications: Secondary | ICD-10-CM

## 2020-02-05 DIAGNOSIS — R0609 Other forms of dyspnea: Secondary | ICD-10-CM

## 2020-02-05 MED ORDER — METOPROLOL TARTRATE 100 MG PO TABS: ORAL_TABLET | ORAL | 0 refills | Status: DC

## 2020-02-05 NOTE — Progress Notes (Signed)
Cardiology Consult Note    Date:  02/05/2020   ID:  SHAWNETTE AUGELLO, DOB Sep 19, 1946, MRN [id number]  PCP:  Tonia Ghent, MD  Cardiologist:  Fransico Him, MD   Chief Complaint  Patient presents with  . New Patient (Initial Visit)    DOE, chest pain    History of Present Illness:  Gloria [name] is a 74 y.o. female who is being seen today for the evaluation of DOE at the request of Tonia Ghent, MD.  This is a 74yo female with a hx of DM2, heart murmur with normal echo in 2011 and HTN.  She recently complained to her PCP that she was having problems with DOE when walking that she first noticed last fall when walking with her daughter in the neighborhood.  She said that she was fine on flat ground but if she waked up an incline she would have to stop due to SOB and chest pressure.  This only occurred once last fall until a month ago when a similar episode occurred again.  Now it has gotten to the point that every time she starts to walk she will get DOE and chest pressure if she is going up an incline or up stairs.  She did have asthma as a child.  She says that occasionally the chest pressure will radiate to the back of her neck but no associated diaphoresis or nausea.  She denies any palpitations, PND, orthopnea, LE edema, dizziness or syncope.  She has no significant tobacco use except experimenting with it in her 20's. She does have a fm hx of CAD on both sides of her family.  Her mother died in her 80's of an MI and thoracic aortic aneurysm.   Past Medical History:  Diagnosis Date  . ABDOMINAL PAIN OTHER SPECIFIED SITE 08/27/2010   Qualifier: Diagnosis of  By: Damita Dunnings MD, Phillip Heal    . Advance directive discussed with patient 01/03/2012   12/2011-AD d/w pt.  She has talked to husband but doesn't have living will yet.  Full code in discussion with patient today.  Would not want prolonged interventions if she were profoundly and/or permanently impaired, ie severe dementia or a condition  with no hope of improvement.   01/13/15-Advance directive d/w pt- husband designated if patient were incapacitated.    . Allergic rhinitis   . Cardiac murmur 2011   with normal echo  . CARDIAC MURMUR 05/26/2010   Qualifier: Diagnosis of  By: Damita Dunnings MD, Phillip Heal    . Diabetes mellitus    Type II  . Diabetes mellitus without complication ([place]) 7/68/0881   Qualifier: Diagnosis of  By: Council Mechanic MD, Hilaria Ota   . Disorder of bone and cartilage 12/20/2007   2016 DXA: -1.4.  Consider repeat in 2021.     Marland Kitchen Essential hypertension, benign 03/30/2011  . Exertional shortness of breath 01/02/2020  . Female cystocele 08/10/2012  . Health care maintenance 02/09/2018  . Hemorrhoids   . Hyperlipidemia   . Hypertension   . Left shoulder pain 07/20/2016  . Medicare annual wellness visit, subsequent 01/03/2012  . MENOPAUSE, SURGICAL 12/16/2009   Qualifier: Diagnosis of  By: Lurlean Nanny LPN, Regina    . Pure hypercholesterolemia 03/30/2011  . RENAL CALCULUS 05/24/2007   Qualifier: Diagnosis of  By: Council Mechanic MD, Hilaria Ota   . RHINITIS 12/24/2010   Qualifier: Diagnosis of  By: Damita Dunnings MD, Phillip Heal    . Snoring 01/02/2020  . Ureteral stone with hydronephrosis 08/02/2012  Past Surgical History:  Procedure Laterality Date  . ABDOMINAL HYSTERECTOMY    . BASAL CELL CARCINOMA EXCISION  08/23/2017   left upper arm  . CARPAL TUNNEL RELEASE     with bilateral releases  . CYSTECTOMY     Left breast, right lumpectomy B9  . INGUINAL HERNIA REPAIR     Right (Dr. Annamaria Boots)  . NSVD     X 2  . TVH with vaginal prolapse  08/29/09   Transvag Tape w/Varitensor, Ant & Post Colporraphy, Uterosacral Lig susp, McCall Culdoplasty  . ueteroscopy  11/01   without laser secondary stone  . Urological procedure  10/22 - 08/31/09   [facility]    Current Medications: Current Meds  Medication Sig  . ACCU-CHEK AVIVA PLUS test strip CHECK BLOOD SUGAR FOUR TIMES DAILY AS NEEDED  . ACCU-CHEK FASTCLIX LANCETS MISC Use to check blood sugar 4 times daily  as needed.  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  Marland Kitchen atorvastatin (LIPITOR) 10 MG tablet TAKE 1 TABLET BY MOUTH DAILY  . Blood Glucose Monitoring Suppl (ACCU-CHEK AVIVA PLUS) W/DEVICE KIT Use to check blood sugar 4 times daily as needed.  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 Cholecalciferol (VITAMIN D) 1000 UNITS capsule Take 2,000 Units by mouth daily.   . hydrochlorothiazide (HYDRODIURIL) 25 MG tablet TAKE ONE TABLET EVERY DAY  . insulin detemir (LEVEMIR FLEXTOUCH) 100 UNIT/ML FlexPen INJECT 66 UNITS SUBCUTANEOUSLY AT BEDTIME  . Insulin Pen Needle (B-D ULTRAFINE III SHORT PEN) 31G X 8 MM MISC USE AS DIRECTED  ** DUE FOR OFFICE VISIT/PHYSICAL  . metFORMIN (GLUCOPHAGE) 1000 MG tablet TAKE 1 TABLET BY MOUTH TWICE DAILY WITH MEALS  . Psyllium (METAMUCIL PO) Take by mouth daily.  . quinapril (ACCUPRIL) 20 MG tablet TAKE ONE TABLET BY MOUTH TWICE DAILY  . vitamin C (ASCORBIC ACID) 500 MG tablet Take 500 mg by mouth daily.    Allergies:   Penicillins, Sulfonamide derivatives, and Ciprofloxin hcl [ciprofloxacin]   Social History   Socioeconomic History  . Marital status: Married    Spouse name: Not on file  . Number of children: 2  . Years of education: Not on file  . Highest education level: Not on file  Occupational History  . Occupation: Retired Pharmacist, hospital (primary education) from the [employer]    Employer: RETIRED  Tobacco Use  . Smoking status: Never Smoker  . Smokeless tobacco: Never Used  Substance and Sexual Activity  . Alcohol use: Yes    Alcohol/week: 2.0 - 3.0 standard drinks    Types: 2 - 3 Glasses of wine per week  . Drug use: No  . Sexual activity: Not Currently  Other Topics Concern  . Not on file  Social History Narrative   Retired Pharmacist, hospital   Marital Status: Married, 1968   Children: 2 children out of the home   Social Determinants of Health   Financial Resource Strain: Low Risk   . Difficulty of Paying Living Expenses: Not hard at all  Food  Insecurity: No Food Insecurity  . Worried About Charity fundraiser in the Last Year: Never true  . Ran Out of Food in the Last Year: Never true  Transportation Needs: No Transportation Needs  . Lack of Transportation (Medical): No  . Lack of Transportation (Non-Medical): No  Physical Activity: Insufficiently Active  . Days of Exercise per Week: 2 days  . Minutes of Exercise per Session: 60 min  Stress: No Stress Concern Present  . Feeling of  Stress : Not at all  Social Connections:   . Frequency of Communication with Friends and Family:   . Frequency of Social Gatherings with Friends and Family:   . Attends Religious Services:   . Active Member of Clubs or Organizations:   . Attends Archivist Meetings:   Marland Kitchen Marital Status:      Family History:  The patient's family history includes Alcohol abuse in her cousin; Breast cancer in her cousin; Diabetes (age of onset: 65) in her paternal aunt; Heart disease in her mother, paternal uncle, and paternal uncle; Hypertension in her mother; Prostate cancer in her son; Stroke in her maternal aunt and paternal aunt; Thyroid disease in an other family member.   ROS:   Please see the history of present illness.    ROS All other systems reviewed and are negative.  No flowsheet data found.     PHYSICAL EXAM:   VS:  BP 120/70   Pulse 81   Ht 5' 6" (1.676 m)   Wt 155 lb 3.2 oz (70.4 kg)   LMP 08/08/2009   SpO2 98%   BMI 25.05 kg/m    GEN: Well nourished, well developed, in no acute distress  HEENT: normal  Neck: no JVD, carotid bruits, or masses Cardiac: RRR; no rubs, or gallops,no edema.  Intact distal pulses bilaterally. 2/6 SM at LLSB to apex Respiratory:  clear to auscultation bilaterally, normal work of breathing GI: soft, nontender, nondistended, + BS MS: no deformity or atrophy  Skin: warm and dry, no rash Neuro:  Alert and Oriented x 3, Strength and sensation are intact Psych: euthymic mood, full affect  Wt Readings  from Last 3 Encounters:  02/05/20 155 lb 3.2 oz (70.4 kg)  01/22/20 157 lb (71.2 kg)  01/01/20 155 lb 4 oz (70.4 kg)      Studies/Labs Reviewed:   EKG:  EKG isnot ordered today.    Recent Labs: 01/01/2020: ALT 16; BUN 17; Creatinine, Ser 0.49; Hemoglobin 14.5; Platelets 179.0; Potassium 4.5; Sodium 139; TSH 0.58   Lipid Panel    Component Value Date/Time   CHOL 119 01/01/2020 0938   TRIG 61.0 01/01/2020 0938   HDL 32.70 (L) 01/01/2020 0938   CHOLHDL 4 01/01/2020 0938   VLDL 12.2 01/01/2020 0938   LDLCALC 74 01/01/2020 0938    Additional studies/ records that were reviewed today include:  Office notes from PCP    ASSESSMENT:    1. DOE (dyspnea on exertion)   2. Murmur, cardiac   3. DM type 2, goal HbA1c < 7% (HCC)   4. Essential hypertension, benign      PLAN:  In order of problems listed above:  1. DOE/Chest pain -this has been going on for about 6 months and occurs when waking up inclines or stairs and has gotten progressively worse with associated chest pain -EKG personally reviewed from PCP and showed NSR with no ST changes -She has a hx of asthma as a child -no significant tobacco hx but strong fm hx of CAD on both sides of the family and she also has DM2, HLD and HTN -I will get a coronary CTA to assess for CAD as well as aortic aneurysm give that her mom had a thoracic aortic aneurysm  2.  Heart mumur -she has a systolic murmur at the LLSB to apex and apparently had a murmur in the past with normal echo -repeat 2D echo  3. DM type 2 -followed by PCP -continue metformin, statin and  ACE I  4.  HTN -BP controlled -continue HCTZ 25m daily and Quinapril 237mBID    Medication Adjustments/Labs and Tests Ordered: Current medicines are reviewed at length with the patient today.  Concerns regarding medicines are outlined above.  Medication changes, Labs and Tests ordered today are listed in the Patient Instructions below.  Patient Instructions    Medication Instructions:  *If you need a refill on your cardiac medications before your next appointment, please call your pharmacy*  Lab Work: Your physician recommends that you return for lab work before your CT test for BMET  If you have labs (blood work) drawn today and your tests are completely normal, you will receive your results only by: . Marland KitchenyChart Message (if you have MyChart) OR . A paper copy in the mail If you have any lab test that is abnormal or we need to change your treatment, we will call you to review the results.  Testing/Procedures: Your physician has requested that you have cardiac CT. Cardiac computed tomography (CT) is a painless test that uses an x-ray machine to take clear, detailed pictures of your heart. For further information please visit [email or web address] follow instruction sheet as given.  Your physician has requested that you have an echocardiogram. Echocardiography is a painless test that uses sound waves to create images of your heart. It provides your doctor with information about the size and shape of your heart and how well your heart's chambers and valves are working. This procedure takes approximately one hour. There are no restrictions for this procedure.  Follow-Up: [facility] and your [facility] needs are our priority.  As part of our continuing mission to provide you with exceptional heart care, we have created designated Provider Care Teams.  These Care Teams include your primary Cardiologist (physician) and Advanced Practice Providers (APPs -  Physician Assistants and Nurse Practitioners) who all work together to provide you with the care you need, when you need it.  We recommend signing up for the patient portal called "MyChart".  Sign up information is provided on this After Visit Summary.  MyChart is used to connect with patients for Virtual Visits (Telemedicine).  Patients are able to view lab/test results, encounter notes,  upcoming appointments, etc.  Non-urgent messages can be sent to your provider as well.   To learn more about what you can do with MyChart, go to [email or web address]   Your next appointment:   As needed  The format for your next appointment:   Either In Person or Virtual  Provider:   You may see TrFransico HimMD or one of the following Advanced Practice Providers on your designated Care Team:    DaMelina CopaPA-C  MiErmalinda BarriosPA-C  Your cardiac CT will be scheduled at one of the below locations:   [facility] [id number] [phone] [facility] at [facility] minutes prior to test start time. Proceed to the [facility] (first floor) to check-in and test prep.  Please follow these instructions carefully (unless otherwise directed):  On the Night Before the Test: . Be sure to Drink plenty of water. . Do not consume any caffeinated/decaffeinated beverages or chocolate 12 hours prior to your test. . Do not take any antihistamines 12 hours prior to your test. . If you take Metformin do not take 24 hours prior to test.  On the Day of the Test: . Drink plenty of water. Do not drink any water within one hour of the test. . Do not eat any food 4 hours prior to the test. . You may take your regular medications prior to the test.  . Take metoprolol (Lopressor) 100 mg two hours prior to test. . HOLD Hydrochlorothiazide morning of the test. . FEMALES- please wear underwire-free bra if available      After the Test: . Drink plenty of water. . After receiving IV contrast, you may experience a mild flushed feeling. This is normal. . On occasion, you may experience a mild rash up to 24 hours after the test. This is not dangerous. If this occurs, you can take Benadryl 25 mg and increase your fluid intake. . If you experience trouble  breathing, this can be serious. If it is severe call 911 IMMEDIATELY. If it is mild, please call our office. . If you take any of these medications: Metformin please do not take 48 hours after completing test unless otherwise instructed.  Once we have confirmed authorization from your insurance company, we will call you to set up a date and time for your test.   For non-scheduling related questions, please contact the cardiac imaging nurse navigator should you have any questions/concerns: Marchia Bond, RN Navigator Cardiac Imaging [facility] [phone] office  For scheduling needs, including cancellations and rescheduling, please call [phone].        Signed, Fransico Him, MD  02/05/2020 10:35 AM    [address], [place], [place]  [address] Phone: [phone]; Fax: [phone]

## 2020-02-05 NOTE — Patient Instructions (Addendum)
Medication Instructions:  *If you need a refill on your cardiac medications before your next appointment, please call your pharmacy*  Lab Work: Your physician recommends that you return for lab work before your CT test for BMET  If you have labs (blood work) drawn today and your tests are completely normal, you will receive your results only by: Marland Kitchen MyChart Message (if you have MyChart) OR . A paper copy in the mail If you have any lab test that is abnormal or we need to change your treatment, we will call you to review the results.  Testing/Procedures: Your physician has requested that you have cardiac CT. Cardiac computed tomography (CT) is a painless test that uses an x-ray machine to take clear, detailed pictures of your heart. For further information please visit [email or web address]. Please follow instruction sheet as given.  Your physician has requested that you have an echocardiogram. Echocardiography is a painless test that uses sound waves to create images of your heart. It provides your doctor with information about the size and shape of your heart and how well your heart's chambers and valves are working. This procedure takes approximately one hour. There are no restrictions for this procedure.  Follow-Up: At [facility], you and your health needs are our priority.  As part of our continuing mission to provide you with exceptional heart care, we have created designated Provider Care Teams.  These Care Teams include your primary Cardiologist (physician) and Advanced Practice Providers (APPs -  Physician Assistants and Nurse Practitioners) who all work together to provide you with the care you need, when you need it.  We recommend signing up for the patient portal called "MyChart".  Sign up information is provided on this After Visit Summary.  MyChart is used to connect with patients for Virtual Visits (Telemedicine).  Patients are able to view lab/test results, encounter notes, upcoming  appointments, etc.  Non-urgent messages can be sent to your provider as well.   To learn more about what you can do with MyChart, go to [email or web address].    Your next appointment:   As needed  The format for your next appointment:   Either In Person or Virtual  Provider:   You may see Fransico Him, MD or one of the following Advanced Practice Providers on your designated Care Team:    Melina Copa, PA-C  Ermalinda Barrios, PA-C  Your cardiac CT will be scheduled at one of the below locations:   [facility] [address] [place],  [address] [phone]  If scheduled at [facility], please arrive at the [facility] main entrance of [facility] 30 minutes prior to test start time. Proceed to the [facility] Radiology Department (first floor) to check-in and test prep.  Please follow these instructions carefully (unless otherwise directed):  On the Night Before the Test: . Be sure to Drink plenty of water. . Do not consume any caffeinated/decaffeinated beverages or chocolate 12 hours prior to your test. . Do not take any antihistamines 12 hours prior to your test. . If you take Metformin do not take 24 hours prior to test.  On the Day of the Test: . Drink plenty of water. Do not drink any water within one hour of the test. . Do not eat any food 4 hours prior to the test. . You may take your regular medications prior to the test.  . Take metoprolol (Lopressor) 100 mg two hours prior to test. . HOLD Hydrochlorothiazide morning  of the test. . FEMALES- please wear underwire-free bra if available      After the Test: . Drink plenty of water. . After receiving IV contrast, you may experience a mild flushed feeling. This is normal. . On occasion, you may experience a mild rash up to 24 hours after the test. This is not dangerous. If this occurs, you can take Benadryl 25 mg and increase your fluid intake. . If you experience trouble breathing,  this can be serious. If it is severe call 911 IMMEDIATELY. If it is mild, please call our office. . If you take any of these medications: Metformin please do not take 48 hours after completing test unless otherwise instructed.  Once we have confirmed authorization from your insurance company, we will call you to set up a date and time for your test.   For non-scheduling related questions, please contact the cardiac imaging nurse navigator should you have any questions/concerns: Marchia Bond, RN Navigator Cardiac Imaging [facility] [phone] office  For scheduling needs, including cancellations and rescheduling, please call [phone].

## 2020-02-12 DIAGNOSIS — G4733 Obstructive sleep apnea (adult) (pediatric): Secondary | ICD-10-CM | POA: Diagnosis not present

## 2020-02-13 ENCOUNTER — Encounter (HOSPITAL_COMMUNITY): Payer: Self-pay

## 2020-02-13 ENCOUNTER — Other Ambulatory Visit
Admission: RE | Admit: 2020-02-13 | Discharge: 2020-02-13 | Disposition: A | Discharge status: Home / Self Care | Payer: Medicare PPO | Source: Ambulatory Visit | Attending: Cardiology | Admitting: Cardiology

## 2020-02-13 ENCOUNTER — Telehealth: Payer: Self-pay | Admitting: Pulmonary Disease

## 2020-02-13 ENCOUNTER — Other Ambulatory Visit: Payer: Self-pay

## 2020-02-13 DIAGNOSIS — R011 Cardiac murmur, unspecified: Secondary | ICD-10-CM | POA: Insufficient documentation

## 2020-02-13 DIAGNOSIS — I209 Angina pectoris, unspecified: Secondary | ICD-10-CM | POA: Insufficient documentation

## 2020-02-13 DIAGNOSIS — G4733 Obstructive sleep apnea (adult) (pediatric): Secondary | ICD-10-CM

## 2020-02-13 LAB — BASIC METABOLIC PANEL
Anion gap: 10 (ref 5–15)
BUN: 21 mg/dL (ref 8–23)
CO2: 24 mmol/L (ref 22–32)
Calcium: 10.1 mg/dL (ref 8.9–10.3)
Chloride: 101 mmol/L (ref 98–111)
Creatinine, Ser: 0.53 mg/dL (ref 0.44–1.00)
GFR calc Af Amer: >60 mL/min (ref >60)
GFR calc non Af Amer: >60 mL/min (ref >60)
Glucose, Bld: 194 mg/dL — ABNORMAL HIGH (ref 70–99)
Potassium: 3.9 mmol/L (ref 3.5–5.1)
Sodium: 135 mmol/L (ref 135–145)

## 2020-02-13 NOTE — Telephone Encounter (Signed)
Patient is returning phone call. Patient phone number is [phone].

## 2020-02-13 NOTE — Telephone Encounter (Signed)
ATC patient.  LM for Patient to return call for sleep results. DME order pending.    Dr. Ander Slade has reviewed the home sleep test this showed moderate obstructive sleep apnea.   Recommendations   Treatment options are CPAP with the settings auto 5 to 15.    Weight loss measures .   Advise against driving while sleepy & against medication with sedative side effects.    Make appointment for 3 months for compliance with download with Dr. Ander Slade.

## 2020-02-13 NOTE — Telephone Encounter (Signed)
Spoke with patient about her results. She is hesitant to use a cpap machine. She wants to know if she would be a good candidate for the oral appliance. She has a family member who has one and it it has helped them tremendously. She also wants to take time to think about her results. I advised her that I would go ahead and ask AO about the oral appliance, she verbalized understanding.   AO, please advise about the oral appliance. Thanks!

## 2020-02-14 ENCOUNTER — Other Ambulatory Visit: Payer: Self-pay

## 2020-02-14 ENCOUNTER — Telehealth: Payer: Self-pay | Admitting: Pulmonary Disease

## 2020-02-14 ENCOUNTER — Ambulatory Visit
Admission: RE | Admit: 2020-02-14 | Discharge: 2020-02-14 | Disposition: A | Discharge status: Home / Self Care | Payer: Medicare PPO | Source: Ambulatory Visit | Attending: Cardiology | Admitting: Cardiology

## 2020-02-14 DIAGNOSIS — R011 Cardiac murmur, unspecified: Secondary | ICD-10-CM | POA: Diagnosis not present

## 2020-02-14 DIAGNOSIS — M5134 Other intervertebral disc degeneration, thoracic region: Secondary | ICD-10-CM | POA: Insufficient documentation

## 2020-02-14 DIAGNOSIS — I209 Angina pectoris, unspecified: Secondary | ICD-10-CM | POA: Diagnosis not present

## 2020-02-14 DIAGNOSIS — I25119 Atherosclerotic heart disease of native coronary artery with unspecified angina pectoris: Secondary | ICD-10-CM | POA: Diagnosis not present

## 2020-02-14 MED ORDER — NITROGLYCERIN 0.4 MG SL SUBL
0.8000 mg | SUBLINGUAL_TABLET | Freq: Once | SUBLINGUAL | Status: AC
  Administered 2020-02-14: 0.8 mg via SUBLINGUAL

## 2020-02-14 MED ORDER — IOHEXOL 350 MG/ML SOLN
75.0000 mL | Freq: Once | INTRAVENOUS | Status: AC | PRN
  Administered 2020-02-14: 75 mL via INTRAVENOUS

## 2020-02-14 MED ORDER — METOPROLOL TARTRATE 5 MG/5ML IV SOLN
5.0000 mg | INTRAVENOUS | Status: DC | PRN
  Administered 2020-02-14: 5 mg via INTRAVENOUS

## 2020-02-14 NOTE — Telephone Encounter (Signed)
Called and spoke with Patient.  Dr.Olalere's recommendations given. Understanding stated.  Referral for oral appliance placed.  Nothing further at this time. Patient request call on cell # [phone].  Patient will be out of town 02/14/20-02/15/20.

## 2020-02-14 NOTE — Telephone Encounter (Signed)
Yes we can refer her to dentist for oral appliance evaluation  Oral appliance is best for mild obstructive sleep apnea, but, if she feels she will tolerate an oral device better than that can be considered as an option of treatment

## 2020-02-14 NOTE — Progress Notes (Signed)
Patient tolerated CT well. Dra1nk water after. Ambulated to exit steady gait.

## 2020-02-15 NOTE — Telephone Encounter (Signed)
Spoke to pt & made her aware.  Nothing further needed. 

## 2020-02-15 NOTE — Telephone Encounter (Signed)
Judeen Hammans is working on this one

## 2020-02-15 NOTE — Telephone Encounter (Signed)
Order was placed yesterday for oral device and I have already filled out paperwork for Dr Ron Parker and am waiting for AO to complete his part of referral.  He will be back in the office on 4/14.  I have not previously called pt and left a message but I did just try to call to make her aware of this.  I have left vm for her to call me back.

## 2020-02-18 ENCOUNTER — Telehealth: Payer: Self-pay | Admitting: Cardiology

## 2020-02-18 ENCOUNTER — Encounter: Payer: Self-pay | Admitting: Family Medicine

## 2020-02-18 DIAGNOSIS — Z1231 Encounter for screening mammogram for malignant neoplasm of breast: Secondary | ICD-10-CM | POA: Diagnosis not present

## 2020-02-18 MED ORDER — ASPIRIN EC 81 MG PO TBEC: 81.0000 mg | DELAYED_RELEASE_TABLET | Freq: Every day | ORAL | 3 refills | Status: AC

## 2020-02-18 NOTE — Telephone Encounter (Signed)
Sueanne Margarita, MD  Tonia Ghent, MD; Antonieta Iba, RN  Coronary CTA very abnormal - likely has significant CAD. please have her set up ASAP at beginning of week to see extender to get set up for LHC - can be virtual OV. Start ASA 81mg  daily. If she develops CP in the mean time that does not resolve with rest she needs to go to [facility] ER     The patient has been notified of the result and verbalized understanding.  All questions (if any) were answered. Antonieta Iba, RN 02/18/2020 1:45 PM  Patient will see Cecilie Kicks, NP tomorrow (04/13). Patient will start on ASA 81 mg daily.

## 2020-02-18 NOTE — Telephone Encounter (Signed)
   Pt is returning call from [facility] regarding CT results  Please call

## 2020-02-18 NOTE — Telephone Encounter (Signed)
Left message for patient to call back  

## 2020-02-18 NOTE — Telephone Encounter (Signed)
Patient said she will be in the building but one floor down (at [facility]) from 10-10:30. If Carly wanted to speak with her in person, she would be able to come up to the office after 10:30. IF Carly just wants to call the patient, it will be best for Carly to call after 10:30

## 2020-02-19 ENCOUNTER — Encounter: Payer: Self-pay | Admitting: Cardiology

## 2020-02-19 ENCOUNTER — Telehealth (INDEPENDENT_AMBULATORY_CARE_PROVIDER_SITE_OTHER): Payer: Medicare PPO | Admitting: Cardiology

## 2020-02-19 ENCOUNTER — Other Ambulatory Visit: Payer: Self-pay

## 2020-02-19 VITALS — HR 72 | Ht 66.0 in | Wt 155.0 lb

## 2020-02-19 DIAGNOSIS — R0609 Other forms of dyspnea: Secondary | ICD-10-CM

## 2020-02-19 DIAGNOSIS — E119 Type 2 diabetes mellitus without complications: Secondary | ICD-10-CM

## 2020-02-19 DIAGNOSIS — R0600 Dyspnea, unspecified: Secondary | ICD-10-CM | POA: Diagnosis not present

## 2020-02-19 DIAGNOSIS — R931 Abnormal findings on diagnostic imaging of heart and coronary circulation: Secondary | ICD-10-CM | POA: Diagnosis not present

## 2020-02-19 DIAGNOSIS — E7800 Pure hypercholesterolemia, unspecified: Secondary | ICD-10-CM

## 2020-02-19 DIAGNOSIS — I10 Essential (primary) hypertension: Secondary | ICD-10-CM | POA: Diagnosis not present

## 2020-02-19 NOTE — H&P (View-Only) (Signed)
Virtual Visit via Video Note   This visit type was conducted due to national recommendations for restrictions regarding the COVID-19 Pandemic (e.g. social distancing) in an effort to limit this patient's exposure and mitigate transmission in our community.  Due to her co-morbid illnesses, this patient is at least at moderate risk for complications without adequate follow up.  This format is felt to be most appropriate for this patient at this time.  All issues noted in this document were discussed and addressed.  A limited physical exam was performed with this format.  Please refer to the patient's chart for her consent to telehealth for [facility].   The patient was identified using 2 identifiers.  Date:  02/19/2020   ID:  Gloria [name], DOB May 06, 1946, MRN [id number]  Patient Location: Home Provider Location: Office  PCP:  Tonia Ghent, MD  Cardiologist:  Fransico Him, MD  Electrophysiologist:  None   Evaluation Performed:  Follow-Up Visit  Chief Complaint:  DOE and abnormal cardiac CTA   History of Present Illness:    Gloria [name] is a 74 y.o. female with abnormal cardiac CTA.   a hx of DM2, heart murmur with normal echo in 2011 and HTN.  She recently complained to her PCP that she was having problems with DOE when walking that she first noticed last fall when walking with her daughter in the neighborhood.  She said that she was fine on flat ground but if she waked up an incline she would have to stop due to SOB and chest pressure.  This only occurred once last fall until a month ago when a similar episode occurred again.  Now it has gotten to the point that every time she starts to walk she will get DOE and chest pressure if she is going up an incline or up stairs.  She did have asthma as a child.  She says that occasionally the chest pressure will radiate to the back of her neck but no associated diaphoresis or nausea.  She denies any palpitations, PND, orthopnea, LE edema,  dizziness or syncope.  She has no significant tobacco use except experimenting with it in her 20's. She does have a fm hx of CAD on both sides of her family.  Her mother died in her 54's of an MI and thoracic aortic aneurysm.  She had cardiac CTA done and >70% stenosis in RCA, LAD, LCX  And >50% LM she was placed on ASA 81 mg daily.  Currently anxious about results.  We reviewed cardiac CTA and cardiac cath with risks and benefits and possible results.  All questions answered  Currently stable and she has started 81 mg ASA daily, she is on statin and will increase post cath.  .    The patient does not have symptoms concerning for COVID-19 infection (fever, chills, cough, or new shortness of breath).    Past Medical History:  Diagnosis Date  . ABDOMINAL PAIN OTHER SPECIFIED SITE 08/27/2010   Qualifier: Diagnosis of  By: Damita Dunnings MD, Phillip Heal    . Advance directive discussed with patient 01/03/2012   12/2011-AD d/w pt.  She has talked to husband but doesn't have living will yet.  Full code in discussion with patient today.  Would not want prolonged interventions if she were profoundly and/or permanently impaired, ie severe dementia or a condition with no hope of improvement.   01/13/15-Advance directive d/w pt- husband designated if patient were incapacitated.    . Allergic rhinitis   .  Cardiac murmur 2011   with normal echo  . CARDIAC MURMUR 05/26/2010   Qualifier: Diagnosis of  By: Damita Dunnings MD, Phillip Heal    . Diabetes mellitus    Type II  . Diabetes mellitus without complication ([place]) 6/38/4665   Qualifier: Diagnosis of  By: Council Mechanic MD, Hilaria Ota   . Disorder of bone and cartilage 12/20/2007   2016 DXA: -1.4.  Consider repeat in 2021.     Marland Kitchen Essential hypertension, benign 03/30/2011  . Exertional shortness of breath 01/02/2020  . Female cystocele 08/10/2012  . Health care maintenance 02/09/2018  . Hemorrhoids   . Hyperlipidemia   . Hypertension   . Left shoulder pain 07/20/2016  . Medicare annual  wellness visit, subsequent 01/03/2012  . MENOPAUSE, SURGICAL 12/16/2009   Qualifier: Diagnosis of  By: Lurlean Nanny LPN, [place]    . Pure hypercholesterolemia 03/30/2011  . RENAL CALCULUS 05/24/2007   Qualifier: Diagnosis of  By: Council Mechanic MD, Hilaria Ota   . RHINITIS 12/24/2010   Qualifier: Diagnosis of  By: Damita Dunnings MD, Phillip Heal    . Snoring 01/02/2020  . Ureteral stone with hydronephrosis 08/02/2012   Past Surgical History:  Procedure Laterality Date  . ABDOMINAL HYSTERECTOMY    . BASAL CELL CARCINOMA EXCISION  08/23/2017   left upper arm  . CARPAL TUNNEL RELEASE     with bilateral releases  . CYSTECTOMY     Left breast, right lumpectomy B9  . INGUINAL HERNIA REPAIR     Right (Dr. Annamaria Boots)  . NSVD     X 2  . TVH with vaginal prolapse  08/29/09   Transvag Tape w/Varitensor, Ant & Post Colporraphy, Uterosacral Lig susp, McCall Culdoplasty  . ueteroscopy  11/01   without laser secondary stone  . Urological procedure  10/22 - 08/31/09   [facility]     Current Meds  Medication Sig  . ACCU-CHEK AVIVA PLUS test strip CHECK BLOOD SUGAR FOUR TIMES DAILY AS NEEDED  . ACCU-CHEK FASTCLIX LANCETS MISC Use to check blood sugar 4 times daily as needed.  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  Marland Kitchen aspirin EC 81 MG tablet Take 1 tablet (81 mg total) by mouth daily.  Marland Kitchen atorvastatin (LIPITOR) 10 MG tablet TAKE 1 TABLET BY MOUTH DAILY  . Blood Glucose Monitoring Suppl (ACCU-CHEK AVIVA PLUS) W/DEVICE KIT Use to check blood sugar 4 times daily as needed.  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 Cholecalciferol (VITAMIN D) 1000 UNITS capsule Take 2,000 Units by mouth daily.   . hydrochlorothiazide (HYDRODIURIL) 25 MG tablet TAKE ONE TABLET EVERY DAY  . insulin detemir (LEVEMIR FLEXTOUCH) 100 UNIT/ML FlexPen INJECT 66 UNITS SUBCUTANEOUSLY AT BEDTIME  . Insulin Pen Needle (B-D ULTRAFINE III SHORT PEN) 31G X 8 MM MISC USE AS DIRECTED  ** DUE FOR OFFICE VISIT/PHYSICAL  . metFORMIN (GLUCOPHAGE) 1000 MG tablet TAKE 1 TABLET BY  MOUTH TWICE DAILY WITH MEALS  . Probiotic Product (ALIGN) 4 MG CAPS Take 4 mg by mouth.  . Psyllium (METAMUCIL PO) Take by mouth as needed.   . quinapril (ACCUPRIL) 20 MG tablet TAKE ONE TABLET BY MOUTH TWICE DAILY  . vitamin C (ASCORBIC ACID) 500 MG tablet Take 500 mg by mouth daily.     Allergies:   Penicillins, Sulfonamide derivatives, and Ciprofloxin hcl [ciprofloxacin]   Social History   Tobacco Use  . Smoking status: Never Smoker  . Smokeless tobacco: Never Used  Substance Use Topics  . Alcohol use: Yes    Alcohol/week: 2.0 - 3.0 standard drinks  Types: 2 - 3 Glasses of wine per week  . Drug use: No     Family Hx: The patient's family history includes Alcohol abuse in her cousin; Breast cancer in her cousin; Diabetes (age of onset: 14) in her paternal aunt; Heart disease in her mother, paternal uncle, and paternal uncle; Hypertension in her mother; Prostate cancer in her son; Stroke in her maternal aunt and paternal aunt; Thyroid disease in an other family member. There is no history of Colon cancer.  ROS:   Please see the history of present illness.    General:no colds or fevers, no weight changes Skin:no rashes or ulcers HEENT:no blurred vision, no congestion CV:see HPI PUL:see HPI GI:no diarrhea constipation or melena, no indigestion GU:no hematuria, no dysuria MS:no joint pain, no claudication Neuro:no syncope, no lightheadedness Endo:+ diabetes, no thyroid disease  All other systems reviewed and are negative.   Prior CV studies:   The following studies were reviewed today:  Cardiac CTA 02/14/20 OVER-READ INTERPRETATION  CT CHEST  The following report is an over-read performed by radiologist Dr. Norlene Duel [facility] on 02/15/2020. This over-read does not include interpretation of cardiac or coronary anatomy or pathology. The coronary calcium score/coronary CTA interpretation by the cardiologist is attached.  COMPARISON:   None.  FINDINGS: Cardiovascular: Aortic atherosclerosis. Mild cardiac enlargement. No pericardial effusion.  Mediastinum/nodes: No mass or adenopathy identified.  Lungs/pleura: Scarring identified within the right upper lobe and right middle lobe.  Upper abdomen: No acute abnormality.  Musculoskeletal: Moderate multilevel thoracic degenerative disc disease with vacuum disc and ventral endplate spurring.  IMPRESSION: 1. No mass or adenopathy identified. 2. Thoracic degenerative disc disease.  Aorta: Normal size. Acending and descending aortic wall calcifications. No dissection.  Aortic Valve:  Trileaflet. mild calcifications.  Coronary Arteries:  Normal coronary origin.  Right dominance.  RCA is a large dominant artery that gives rise to PDA and PLA. There is heavily calcified plaque thoughout the vessel causing severe stenosis (>70% )  Left main is a large artery that gives rise to LAD and LCX arteries.  LAD is a large vessel that has heavily calcified plaque in the proximal, mid and distal segments causing severe stenosis (>70% )  LCX is a non-dominant artery that gives rise to 2 obtuse marginal branches. There is heavily calcified plaque in the proximal to mid segments causing severe stenosis (>70% )  Other findings:  Normal pulmonary vein drainage into the left atrium.  Normal left atrial appendage without a thrombus.  Normal size of the pulmonary artery.  IMPRESSION: 1. Coronary calcium score of 6179. This was 46 percentile for age and sex matched control.  2. Normal ascending aorta size  3. Normal coronary origin with right dominance.  4. 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 CAD-RADS 4 Severe stenosis. (70-99% or > 50% left main). Cardiac catheterization or CT FFR is recommended. Consider symptom-guided anti-ischemic pharmacotherapy as well as risk factor modification per  guideline directed care. Additional analysis with CT FFR will be submitted and reported separately.  FFR not yet back but due to severity of CAD will need cath.   Labs/Other Tests and Data Reviewed:    EKG:  No ECG reviewed.  EKG from Feb will need one in Short stay prior to cath    Recent Labs: 01/01/2020: ALT 16; Hemoglobin 14.5; Platelets 179.0; TSH 0.58 02/13/2020: BUN 21; Creatinine, Ser 0.53; Potassium 3.9; Sodium 135   Recent Lipid Panel Lab Results  Component Value Date/Time   CHOL 119 01/01/2020 09:38 AM   TRIG 61.0 01/01/2020 09:38 AM   HDL 32.70 (L) 01/01/2020 09:38 AM   CHOLHDL 4 01/01/2020 09:38 AM   LDLCALC 74 01/01/2020 09:38 AM    Wt Readings from Last 3 Encounters:  02/19/20 155 lb (70.3 kg)  02/05/20 155 lb 3.2 oz (70.4 kg)  01/22/20 157 lb (71.2 kg)     Objective:    Vital Signs:  Pulse 72   Ht '5\' 6"'  (1.676 m)   Wt 155 lb (70.3 kg)   LMP 08/08/2009   BMI 25.02 kg/m    VITAL SIGNS:  reviewed General: NAD  Pulmonary can speak in complete sentences without SOB at rest   Neuro A&O X 3 answers questions approp and asks approp questions.   ASSESSMENT & PLAN:    1. DOE anginal equivalent 2. 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 Radford Pax has reviewed and plan for cardiac cath to evaluate.  The patient understands that risks included but are not limited to stroke (1 in 1000), death (1 in 39), kidney failure [usually temporary] (1 in 500), bleeding (1 in 200), allergic reaction [possibly serious] (1 in 200).  3. HLD with LDL of 74 on lipitor  4. HTN controlled will need BB but will wait for cath 5. DM-2 per IM   COVID-19 Education: The signs and symptoms of COVID-19 were discussed with the patient and how to seek care for testing (follow up with PCP or arrange E-visit).  The importance of social distancing was discussed today.  Time:   Today, I have spent 20 minutes with the patient with telehealth technology discussing the above problems.     Medication  Adjustments/Labs and Tests Ordered: Current medicines are reviewed at length with the patient today.  Concerns regarding medicines are outlined above.   Tests Ordered: No orders of the defined types were placed in this encounter.   Medication Changes: No orders of the defined types were placed in this encounter.   Follow Up:  In Person in 2 week(s)  Signed, Cecilie Kicks, NP  02/19/2020 11:06 AM    [place]

## 2020-02-19 NOTE — Progress Notes (Signed)
Virtual Visit via Video Note   This visit type was conducted due to national recommendations for restrictions regarding the COVID-19 Pandemic (e.g. social distancing) in an effort to limit this patient's exposure and mitigate transmission in our community.  Due to her co-morbid illnesses, this patient is at least at moderate risk for complications without adequate follow up.  This format is felt to be most appropriate for this patient at this time.  All issues noted in this document were discussed and addressed.  A limited physical exam was performed with this format.  Please refer to the patient's chart for her consent to telehealth for [facility].   The patient was identified using 2 identifiers.  Date:  02/19/2020   ID:  Gloria [name], DOB [birth year], MRN [id number]  Patient Location: Home Provider Location: Office  PCP:  Tonia Ghent, MD  Cardiologist:  Fransico Him, MD  Electrophysiologist:  None   Evaluation Performed:  Follow-Up Visit  Chief Complaint:  DOE and abnormal cardiac CTA   History of Present Illness:    Gloria [name] is a 74 y.o. female with abnormal cardiac CTA.   a hx of DM2, heart murmur with normal echo in 2011 and HTN.  She recently complained to her PCP that she was having problems with DOE when walking that she first noticed last fall when walking with her daughter in the neighborhood.  She said that she was fine on flat ground but if she waked up an incline she would have to stop due to SOB and chest pressure.  This only occurred once last fall until a month ago when a similar episode occurred again.  Now it has gotten to the point that every time she starts to walk she will get DOE and chest pressure if she is going up an incline or up stairs.  She did have asthma as a child.  She says that occasionally the chest pressure will radiate to the back of her neck but no associated diaphoresis or nausea.  She denies any palpitations, PND, orthopnea, LE edema,  dizziness or syncope.  She has no significant tobacco use except experimenting with it in her 20's. She does have a fm hx of CAD on both sides of her family.  Her mother died in her 62's of an MI and thoracic aortic aneurysm.  She had cardiac CTA done and >70% stenosis in RCA, LAD, LCX  And >50% LM she was placed on ASA 81 mg daily.  Currently anxious about results.  We reviewed cardiac CTA and cardiac cath with risks and benefits and possible results.  All questions answered  Currently stable and she has started 81 mg ASA daily, she is on statin and will increase post cath.  .    The patient does not have symptoms concerning for COVID-19 infection (fever, chills, cough, or new shortness of breath).    Past Medical History:  Diagnosis Date  . ABDOMINAL PAIN OTHER SPECIFIED SITE 08/27/2010   Qualifier: Diagnosis of  By: Damita Dunnings MD, Phillip Heal    . Advance directive discussed with patient 01/03/2012   12/2011-AD d/w pt.  She has talked to husband but doesn't have living will yet.  Full code in discussion with patient today.  Would not want prolonged interventions if she were profoundly and/or permanently impaired, ie severe dementia or a condition with no hope of improvement.   01/13/15-Advance directive d/w pt- husband designated if patient were incapacitated.    . Allergic rhinitis   .  Cardiac murmur 2011   with normal echo  . CARDIAC MURMUR 05/26/2010   Qualifier: Diagnosis of  By: Damita Dunnings MD, Phillip Heal    . Diabetes mellitus    Type II  . Diabetes mellitus without complication ([place]) 4/48/1856   Qualifier: Diagnosis of  By: Council Mechanic MD, Hilaria Ota   . Disorder of bone and cartilage 12/20/2007   2016 DXA: -1.4.  Consider repeat in 2021.     Marland Kitchen Essential hypertension, benign 03/30/2011  . Exertional shortness of breath 01/02/2020  . Female cystocele 08/10/2012  . Health care maintenance 02/09/2018  . Hemorrhoids   . Hyperlipidemia   . Hypertension   . Left shoulder pain 07/20/2016  . Medicare annual  wellness visit, subsequent 01/03/2012  . MENOPAUSE, SURGICAL 12/16/2009   Qualifier: Diagnosis of  By: Lurlean Nanny LPN, [place]    . Pure hypercholesterolemia 03/30/2011  . RENAL CALCULUS 05/24/2007   Qualifier: Diagnosis of  By: Council Mechanic MD, Hilaria Ota   . RHINITIS 12/24/2010   Qualifier: Diagnosis of  By: Damita Dunnings MD, Phillip Heal    . Snoring 01/02/2020  . Ureteral stone with hydronephrosis 08/02/2012   Past Surgical History:  Procedure Laterality Date  . ABDOMINAL HYSTERECTOMY    . BASAL CELL CARCINOMA EXCISION  08/23/2017   left upper arm  . CARPAL TUNNEL RELEASE     with bilateral releases  . CYSTECTOMY     Left breast, right lumpectomy B9  . INGUINAL HERNIA REPAIR     Right (Dr. Annamaria Boots)  . NSVD     X 2  . TVH with vaginal prolapse  08/29/09   Transvag Tape w/Varitensor, Ant & Post Colporraphy, Uterosacral Lig susp, McCall Culdoplasty  . ueteroscopy  11/01   without laser secondary stone  . Urological procedure  10/22 - 08/31/09   [facility]     Current Meds  Medication Sig  . ACCU-CHEK AVIVA PLUS test strip CHECK BLOOD SUGAR FOUR TIMES DAILY AS NEEDED  . ACCU-CHEK FASTCLIX LANCETS MISC Use to check blood sugar 4 times daily as needed.  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  Marland Kitchen aspirin EC 81 MG tablet Take 1 tablet (81 mg total) by mouth daily.  Marland Kitchen atorvastatin (LIPITOR) 10 MG tablet TAKE 1 TABLET BY MOUTH DAILY  . Blood Glucose Monitoring Suppl (ACCU-CHEK AVIVA PLUS) W/DEVICE KIT Use to check blood sugar 4 times daily as needed.  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 Cholecalciferol (VITAMIN D) 1000 UNITS capsule Take 2,000 Units by mouth daily.   . hydrochlorothiazide (HYDRODIURIL) 25 MG tablet TAKE ONE TABLET EVERY DAY  . insulin detemir (LEVEMIR FLEXTOUCH) 100 UNIT/ML FlexPen INJECT 66 UNITS SUBCUTANEOUSLY AT BEDTIME  . Insulin Pen Needle (B-D ULTRAFINE III SHORT PEN) 31G X 8 MM MISC USE AS DIRECTED  ** DUE FOR OFFICE VISIT/PHYSICAL  . metFORMIN (GLUCOPHAGE) 1000 MG tablet TAKE 1 TABLET BY  MOUTH TWICE DAILY WITH MEALS  . Probiotic Product (ALIGN) 4 MG CAPS Take 4 mg by mouth.  . Psyllium (METAMUCIL PO) Take by mouth as needed.   . quinapril (ACCUPRIL) 20 MG tablet TAKE ONE TABLET BY MOUTH TWICE DAILY  . vitamin C (ASCORBIC ACID) 500 MG tablet Take 500 mg by mouth daily.     Allergies:   Penicillins, Sulfonamide derivatives, and Ciprofloxin hcl [ciprofloxacin]   Social History   Tobacco Use  . Smoking status: Never Smoker  . Smokeless tobacco: Never Used  Substance Use Topics  . Alcohol use: Yes    Alcohol/week: 2.0 - 3.0 standard drinks  Types: 2 - 3 Glasses of wine per week  . Drug use: No     Family Hx: The patient's family history includes Alcohol abuse in her cousin; Breast cancer in her cousin; Diabetes (age of onset: 17) in her paternal aunt; Heart disease in her mother, paternal uncle, and paternal uncle; Hypertension in her mother; Prostate cancer in her son; Stroke in her maternal aunt and paternal aunt; Thyroid disease in an other family member. There is no history of Colon cancer.  ROS:   Please see the history of present illness.    General:no colds or fevers, no weight changes Skin:no rashes or ulcers HEENT:no blurred vision, no congestion CV:see HPI PUL:see HPI GI:no diarrhea constipation or melena, no indigestion GU:no hematuria, no dysuria MS:no joint pain, no claudication Neuro:no syncope, no lightheadedness Endo:+ diabetes, no thyroid disease  All other systems reviewed and are negative.   Prior CV studies:   The following studies were reviewed today:  Cardiac CTA 02/14/20 OVER-READ INTERPRETATION  CT CHEST  The following report is an over-read performed by radiologist Dr. Norlene Duel [facility] on 02/15/2020. This over-read does not include interpretation of cardiac or coronary anatomy or pathology. The coronary calcium score/coronary CTA interpretation by the cardiologist is attached.  COMPARISON:   None.  FINDINGS: Cardiovascular: Aortic atherosclerosis. Mild cardiac enlargement. No pericardial effusion.  Mediastinum/nodes: No mass or adenopathy identified.  Lungs/pleura: Scarring identified within the right upper lobe and right middle lobe.  Upper abdomen: No acute abnormality.  Musculoskeletal: Moderate multilevel thoracic degenerative disc disease with vacuum disc and ventral endplate spurring.  IMPRESSION: 1. No mass or adenopathy identified. 2. Thoracic degenerative disc disease.  Aorta: Normal size. Acending and descending aortic wall calcifications. No dissection.  Aortic Valve:  Trileaflet. mild calcifications.  Coronary Arteries:  Normal coronary origin.  Right dominance.  RCA is a large dominant artery that gives rise to PDA and PLA. There is heavily calcified plaque thoughout the vessel causing severe stenosis (>70% )  Left main is a large artery that gives rise to LAD and LCX arteries.  LAD is a large vessel that has heavily calcified plaque in the proximal, mid and distal segments causing severe stenosis (>70% )  LCX is a non-dominant artery that gives rise to 2 obtuse marginal branches. There is heavily calcified plaque in the proximal to mid segments causing severe stenosis (>70% )  Other findings:  Normal pulmonary vein drainage into the left atrium.  Normal left atrial appendage without a thrombus.  Normal size of the pulmonary artery.  IMPRESSION: 1. Coronary calcium score of 6179. This was 68 percentile for age and sex matched control.  2. Normal ascending aorta size  3. Normal coronary origin with right dominance.  4. 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 CAD-RADS 4 Severe stenosis. (70-99% or > 50% left main). Cardiac catheterization or CT FFR is recommended. Consider symptom-guided anti-ischemic pharmacotherapy as well as risk factor modification per  guideline directed care. Additional analysis with CT FFR will be submitted and reported separately.  FFR not yet back but due to severity of CAD will need cath.   Labs/Other Tests and Data Reviewed:    EKG:  No ECG reviewed.  EKG from Feb will need one in Short stay prior to cath    Recent Labs: 01/01/2020: ALT 16; Hemoglobin 14.5; Platelets 179.0; TSH 0.58 02/13/2020: BUN 21; Creatinine, Ser 0.53; Potassium 3.9; Sodium 135   Recent Lipid Panel Lab Results  Component Value Date/Time   CHOL 119 01/01/2020 09:38 AM   TRIG 61.0 01/01/2020 09:38 AM   HDL 32.70 (L) 01/01/2020 09:38 AM   CHOLHDL 4 01/01/2020 09:38 AM   LDLCALC 74 01/01/2020 09:38 AM    Wt Readings from Last 3 Encounters:  02/19/20 155 lb (70.3 kg)  02/05/20 155 lb 3.2 oz (70.4 kg)  01/22/20 157 lb (71.2 kg)     Objective:    Vital Signs:  Pulse 72   Ht '5\' 6"'  (1.676 m)   Wt 155 lb (70.3 kg)   LMP 08/08/2009   BMI 25.02 kg/m    VITAL SIGNS:  reviewed General: NAD  Pulmonary can speak in complete sentences without SOB at rest   Neuro A&O X 3 answers questions approp and asks approp questions.   ASSESSMENT & PLAN:    1. DOE anginal equivalent 2. 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 Radford Pax has reviewed and plan for cardiac cath to evaluate.  The patient understands that risks included but are not limited to stroke (1 in 1000), death (1 in 54), kidney failure [usually temporary] (1 in 500), bleeding (1 in 200), allergic reaction [possibly serious] (1 in 200).  3. HLD with LDL of 74 on lipitor  4. HTN controlled will need BB but will wait for cath 5. DM-2 per IM   COVID-19 Education: The signs and symptoms of COVID-19 were discussed with the patient and how to seek care for testing (follow up with PCP or arrange E-visit).  The importance of social distancing was discussed today.  Time:   Today, I have spent 20 minutes with the patient with telehealth technology discussing the above problems.     Medication  Adjustments/Labs and Tests Ordered: Current medicines are reviewed at length with the patient today.  Concerns regarding medicines are outlined above.   Tests Ordered: No orders of the defined types were placed in this encounter.   Medication Changes: No orders of the defined types were placed in this encounter.   Follow Up:  In Person in 2 week(s)  Signed, Cecilie Kicks, NP  02/19/2020 11:06 AM    [place]

## 2020-02-19 NOTE — H&P (View-Only) (Signed)
Virtual Visit via Video Note   This visit type was conducted due to national recommendations for restrictions regarding the COVID-19 Pandemic (e.g. social distancing) in an effort to limit this patient's exposure and mitigate transmission in our community.  Due to her co-morbid illnesses, this patient is at least at moderate risk for complications without adequate follow up.  This format is felt to be most appropriate for this patient at this time.  All issues noted in this document were discussed and addressed.  A limited physical exam was performed with this format.  Please refer to the patient's chart for her consent to telehealth for [facility].   The patient was identified using 2 identifiers.  Date:  02/19/2020   ID:  Gloria [name], DOB 1946/02/13, MRN [id number]  Patient Location: Home Provider Location: Office  PCP:  Tonia Ghent, MD  Cardiologist:  Fransico Him, MD  Electrophysiologist:  None   Evaluation Performed:  Follow-Up Visit  Chief Complaint:  DOE and abnormal cardiac CTA   History of Present Illness:    Gloria [name] is a 74 y.o. female with abnormal cardiac CTA.   a hx of DM2, heart murmur with normal echo in 2011 and HTN.  She recently complained to her PCP that she was having problems with DOE when walking that she first noticed last fall when walking with her daughter in the neighborhood.  She said that she was fine on flat ground but if she waked up an incline she would have to stop due to SOB and chest pressure.  This only occurred once last fall until a month ago when a similar episode occurred again.  Now it has gotten to the point that every time she starts to walk she will get DOE and chest pressure if she is going up an incline or up stairs.  She did have asthma as a child.  She says that occasionally the chest pressure will radiate to the back of her neck but no associated diaphoresis or nausea.  She denies any palpitations, PND, orthopnea, LE edema,  dizziness or syncope.  She has no significant tobacco use except experimenting with it in her 20's. She does have a fm hx of CAD on both sides of her family.  Her mother died in her 28's of an MI and thoracic aortic aneurysm.  She had cardiac CTA done and >70% stenosis in RCA, LAD, LCX  And >50% LM she was placed on ASA 81 mg daily.  Currently anxious about results.  We reviewed cardiac CTA and cardiac cath with risks and benefits and possible results.  All questions answered  Currently stable and she has started 81 mg ASA daily, she is on statin and will increase post cath.  .    The patient does not have symptoms concerning for COVID-19 infection (fever, chills, cough, or new shortness of breath).    Past Medical History:  Diagnosis Date  . ABDOMINAL PAIN OTHER SPECIFIED SITE 08/27/2010   Qualifier: Diagnosis of  By: Damita Dunnings MD, Phillip Heal    . Advance directive discussed with patient 01/03/2012   12/2011-AD d/w pt.  She has talked to husband but doesn't have living will yet.  Full code in discussion with patient today.  Would not want prolonged interventions if she were profoundly and/or permanently impaired, ie severe dementia or a condition with no hope of improvement.   01/13/15-Advance directive d/w pt- husband designated if patient were incapacitated.    . Allergic rhinitis   .  Cardiac murmur 2011   with normal echo  . CARDIAC MURMUR 05/26/2010   Qualifier: Diagnosis of  By: Damita Dunnings MD, Phillip Heal    . Diabetes mellitus    Type II  . Diabetes mellitus without complication ([place]) 8/36/6294   Qualifier: Diagnosis of  By: Council Mechanic MD, Hilaria Ota   . Disorder of bone and cartilage 12/20/2007   2016 DXA: -1.4.  Consider repeat in 2021.     Marland Kitchen Essential hypertension, benign 03/30/2011  . Exertional shortness of breath 01/02/2020  . Female cystocele 08/10/2012  . Health care maintenance 02/09/2018  . Hemorrhoids   . Hyperlipidemia   . Hypertension   . Left shoulder pain 07/20/2016  . Medicare annual  wellness visit, subsequent 01/03/2012  . MENOPAUSE, SURGICAL 12/16/2009   Qualifier: Diagnosis of  By: Lurlean Nanny LPN, Reg[place]    . Pure hypercholesterolemia 03/30/2011  . RENAL CALCULUS 05/24/2007   Qualifier: Diagnosis of  By: Council Mechanic MD, Hilaria Ota   . RHINITIS 12/24/2010   Qualifier: Diagnosis of  By: Damita Dunnings MD, Phillip Heal    . Snoring 01/02/2020  . Ureteral stone with hydronephrosis 08/02/2012   Past Surgical History:  Procedure Laterality Date  . ABDOMINAL HYSTERECTOMY    . BASAL CELL CARCINOMA EXCISION  08/23/2017   left upper arm  . CARPAL TUNNEL RELEASE     with bilateral releases  . CYSTECTOMY     Left breast, right lumpectomy B9  . INGUINAL HERNIA REPAIR     Right (Dr. Annamaria Boots)  . NSVD     X 2  . TVH with vaginal prolapse  08/29/09   Transvag Tape w/Varitensor, Ant & Post Colporraphy, Uterosacral Lig susp, McCall Culdoplasty  . ueteroscopy  11/01   without laser secondary stone  . Urological procedure  10/22 - 08/31/09   [facility]     Current Meds  Medication Sig  . ACCU-CHEK AVIVA PLUS test strip CHECK BLOOD SUGAR FOUR TIMES DAILY AS NEEDED  . ACCU-CHEK FASTCLIX LANCETS MISC Use to check blood sugar 4 times daily as needed.  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  Marland Kitchen aspirin EC 81 MG tablet Take 1 tablet (81 mg total) by mouth daily.  Marland Kitchen atorvastatin (LIPITOR) 10 MG tablet TAKE 1 TABLET BY MOUTH DAILY  . Blood Glucose Monitoring Suppl (ACCU-CHEK AVIVA PLUS) W/DEVICE KIT Use to check blood sugar 4 times daily as needed.  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 Cholecalciferol (VITAMIN D) 1000 UNITS capsule Take 2,000 Units by mouth daily.   . hydrochlorothiazide (HYDRODIURIL) 25 MG tablet TAKE ONE TABLET EVERY DAY  . insulin detemir (LEVEMIR FLEXTOUCH) 100 UNIT/ML FlexPen INJECT 66 UNITS SUBCUTANEOUSLY AT BEDTIME  . Insulin Pen Needle (B-D ULTRAFINE III SHORT PEN) 31G X 8 MM MISC USE AS DIRECTED  ** DUE FOR OFFICE VISIT/PHYSICAL  . metFORMIN (GLUCOPHAGE) 1000 MG tablet TAKE 1 TABLET BY  MOUTH TWICE DAILY WITH MEALS  . Probiotic Product (ALIGN) 4 MG CAPS Take 4 mg by mouth.  . Psyllium (METAMUCIL PO) Take by mouth as needed.   . quinapril (ACCUPRIL) 20 MG tablet TAKE ONE TABLET BY MOUTH TWICE DAILY  . vitamin C (ASCORBIC ACID) 500 MG tablet Take 500 mg by mouth daily.     Allergies:   Penicillins, Sulfonamide derivatives, and Ciprofloxin hcl [ciprofloxacin]   Social History   Tobacco Use  . Smoking status: Never Smoker  . Smokeless tobacco: Never Used  Substance Use Topics  . Alcohol use: Yes    Alcohol/week: 2.0 - 3.0 standard drinks  Types: 2 - 3 Glasses of wine per week  . Drug use: No     Family Hx: The patient's family history includes Alcohol abuse in her cousin; Breast cancer in her cousin; Diabetes (age of onset: 48) in her paternal aunt; Heart disease in her mother, paternal uncle, and paternal uncle; Hypertension in her mother; Prostate cancer in her son; Stroke in her maternal aunt and paternal aunt; Thyroid disease in an other family member. There is no history of Colon cancer.  ROS:   Please see the history of present illness.    General:no colds or fevers, no weight changes Skin:no rashes or ulcers HEENT:no blurred vision, no congestion CV:see HPI PUL:see HPI GI:no diarrhea constipation or melena, no indigestion GU:no hematuria, no dysuria MS:no joint pain, no claudication Neuro:no syncope, no lightheadedness Endo:+ diabetes, no thyroid disease  All other systems reviewed and are negative.   Prior CV studies:   The following studies were reviewed today:  Cardiac CTA 02/14/20 OVER-READ INTERPRETATION  CT CHEST  The following report is an over-read performed by radiologist Dr. Norlene Duel [facility] on 02/15/2020. This over-read does not include interpretation of cardiac or coronary anatomy or pathology. The coronary calcium score/coronary CTA interpretation by the cardiologist is attached.  COMPARISON:  None.   FINDINGS: Cardiovascular: Aortic atherosclerosis. Mild cardiac enlargement. No pericardial effusion.  Mediastinum/nodes: No mass or adenopathy identified.  Lungs/pleura: Scarring identified within the right upper lobe and right middle lobe.  Upper abdomen: No acute abnormality.  Musculoskeletal: Moderate multilevel thoracic degenerative disc disease with vacuum disc and ventral endplate spurring.  IMPRESSION: 1. No mass or adenopathy identified. 2. Thoracic degenerative disc disease.  Aorta: Normal size. Acending and descending aortic wall calcifications. No dissection.  Aortic Valve:  Trileaflet. mild calcifications.  Coronary Arteries:  Normal coronary origin.  Right dominance.  RCA is a large dominant artery that gives rise to PDA and PLA. There is heavily calcified plaque thoughout the vessel causing severe stenosis (>70% )  Left main is a large artery that gives rise to LAD and LCX arteries.  LAD is a large vessel that has heavily calcified plaque in the proximal, mid and distal segments causing severe stenosis (>70% )  LCX is a non-dominant artery that gives rise to 2 obtuse marginal branches. There is heavily calcified plaque in the proximal to mid segments causing severe stenosis (>70% )  Other findings:  Normal pulmonary vein drainage into the left atrium.  Normal left atrial appendage without a thrombus.  Normal size of the pulmonary artery.  IMPRESSION: 1. Coronary calcium score of 6179. This was 36 percentile for age and sex matched control.  2. Normal ascending aorta size  3. Normal coronary origin with right dominance.  4. 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 CAD-RADS 4 Severe stenosis. (70-99% or > 50% left main). Cardiac catheterization or CT FFR is recommended. Consider symptom-guided anti-ischemic pharmacotherapy as well as risk factor modification per guideline  directed care. Additional analysis with CT FFR will be submitted and reported separately.  FFR not yet back but due to severity of CAD will need cath.   Labs/Other Tests and Data Reviewed:    EKG:  No ECG reviewed.  EKG from Feb will need one in Short stay prior to cath    Recent Labs: 01/01/2020: ALT 16; Hemoglobin 14.5; Platelets 179.0; TSH 0.58 02/13/2020: BUN 21; Creatinine, Ser 0.53; Potassium 3.9; Sodium 135   Recent Lipid Panel Lab Results  Component Value Date/Time   CHOL 119 01/01/2020 09:38 AM   TRIG 61.0 01/01/2020 09:38 AM   HDL 32.70 (L) 01/01/2020 09:38 AM   CHOLHDL 4 01/01/2020 09:38 AM   LDLCALC 74 01/01/2020 09:38 AM    Wt Readings from Last 3 Encounters:  02/19/20 155 lb (70.3 kg)  02/05/20 155 lb 3.2 oz (70.4 kg)  01/22/20 157 lb (71.2 kg)     Objective:    Vital Signs:  Pulse 72   Ht '5\' 6"'  (1.676 m)   Wt 155 lb (70.3 kg)   LMP 08/08/2009   BMI 25.02 kg/m    VITAL SIGNS:  reviewed General: NAD  Pulmonary can speak in complete sentences without SOB at rest   Neuro A&O X 3 answers questions approp and asks approp questions.   ASSESSMENT & PLAN:    1. DOE anginal equivalent 2. 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 Radford Pax has reviewed and plan for cardiac cath to evaluate.  The patient understands that risks included but are not limited to stroke (1 in 1000), death (1 in 37), kidney failure [usually temporary] (1 in 500), bleeding (1 in 200), allergic reaction [possibly serious] (1 in 200).  3. HLD with LDL of 74 on lipitor  4. HTN controlled will need BB but will wait for cath 5. DM-2 per IM   COVID-19 Education: The signs and symptoms of COVID-19 were discussed with the patient and how to seek care for testing (follow up with PCP or arrange E-visit).  The importance of social distancing was discussed today.  Time:   Today, I have spent 20 minutes with the patient with telehealth technology discussing the above problems.     Medication  Adjustments/Labs and Tests Ordered: Current medicines are reviewed at length with the patient today.  Concerns regarding medicines are outlined above.   Tests Ordered: No orders of the defined types were placed in this encounter.   Medication Changes: No orders of the defined types were placed in this encounter.   Follow Up:  In Person in 2 week(s)  Signed, Cecilie Kicks, NP  02/19/2020 11:06 AM    [place]

## 2020-02-20 ENCOUNTER — Other Ambulatory Visit
Admission: RE | Admit: 2020-02-20 | Discharge: 2020-02-20 | Disposition: A | Discharge status: Home / Self Care | Payer: Medicare PPO | Source: Ambulatory Visit | Attending: Cardiology | Admitting: Cardiology

## 2020-02-20 ENCOUNTER — Telehealth: Payer: Self-pay | Admitting: Cardiology

## 2020-02-20 ENCOUNTER — Other Ambulatory Visit: Payer: Self-pay

## 2020-02-20 DIAGNOSIS — Z01812 Encounter for preprocedural laboratory examination: Secondary | ICD-10-CM | POA: Insufficient documentation

## 2020-02-20 DIAGNOSIS — Z20822 Contact with and (suspected) exposure to covid-19: Secondary | ICD-10-CM | POA: Insufficient documentation

## 2020-02-20 LAB — CBC
HCT: 43.2 % (ref 36.0–46.0)
Hemoglobin: 14.8 g/dL (ref 12.0–15.0)
MCH: 30.6 pg (ref 26.0–34.0)
MCHC: 34.3 g/dL (ref 30.0–36.0)
MCV: 89.3 fL (ref 80.0–100.0)
Platelets: 224 10*3/uL (ref 150–400)
RBC: 4.84 MIL/uL (ref 3.87–5.11)
RDW: 13.1 % (ref 11.5–15.5)
WBC: 4.8 10*3/uL (ref 4.0–10.5)
nRBC: 0 % (ref 0.0–0.2)

## 2020-02-20 LAB — SARS CORONAVIRUS 2 (TAT 6-24 HRS): SARS Coronavirus 2: NEGATIVE

## 2020-02-20 NOTE — Telephone Encounter (Signed)
New Message  Pt called and needed to speak with Carlyle about blood work on catherization. She thinks she made a mess and wants to know what to do next  Please call back asap

## 2020-02-20 NOTE — Telephone Encounter (Signed)
Spoke with the patient and advised her that where she had her lab work done was okay. We are able to see it in the system. Patient thanked me for the call.

## 2020-02-21 ENCOUNTER — Telehealth: Payer: Self-pay | Admitting: *Deleted

## 2020-02-21 NOTE — Telephone Encounter (Signed)
Pt contacted pre-catheterization scheduled at [facility] for: Friday February 22, 2020 7:30 AM Verified arrival time and place: [facility]) at: 5:30 AM   No solid food after midnight prior to cath, clear liquids until 5 AM day of procedure.  Hold: Metformin-day of procedure and 48 hours post procedure. Insulin-1/2 usual PM dose. HCTZ- AM of procedure  Except hold medications  AM meds can be  taken pre-cath with sip of water including: ASA 81 mg   Confirmed patient has responsible adult to drive home post procedure and observe 24 hours after arriving home: yes  Currently, due to Covid-19 pandemic, only one person will be allowed with patient. Must be the same person for patient's entire stay and will be required to wear a mask. They will be asked to wait in the waiting room for the duration of the patient's stay.  Patients are required to wear a mask when they enter the hospital.      COVID-19 Pre-Screening Questions:  . In the past 7 to 10 days have you had a cough,  shortness of breath, headache, congestion, fever (100 or greater) body aches, chills, sore throat, or sudden loss of taste or sense of smell? Shortness of breath, reason for procedure . Have you been around anyone with known Covid 19 in the past 7 to 10 days? no . Have you been around anyone who is awaiting Covid 19 test results in the past 7 to 10 days? . Have you been around anyone who  has mentioned symptoms of Covid 19 within the past 7 to 10 days? No  I reviewed procedure/mask/visitor instructions, COVID 19 screening questions with patient.

## 2020-02-22 ENCOUNTER — Encounter (HOSPITAL_COMMUNITY)
Admission: RE | Disposition: A | Discharge status: Home / Self Care | Payer: Self-pay | Source: Home / Self Care | Attending: Cardiovascular Disease

## 2020-02-22 ENCOUNTER — Other Ambulatory Visit: Payer: Self-pay

## 2020-02-22 ENCOUNTER — Ambulatory Visit (HOSPITAL_COMMUNITY)
Admission: RE | Admit: 2020-02-22 | Discharge: 2020-02-22 | Disposition: A | Discharge status: Home / Self Care | Payer: Medicare PPO | Attending: Cardiovascular Disease | Admitting: Cardiovascular Disease

## 2020-02-22 DIAGNOSIS — Z7982 Long term (current) use of aspirin: Secondary | ICD-10-CM | POA: Diagnosis not present

## 2020-02-22 DIAGNOSIS — E119 Type 2 diabetes mellitus without complications: Secondary | ICD-10-CM | POA: Diagnosis not present

## 2020-02-22 DIAGNOSIS — Z794 Long term (current) use of insulin: Secondary | ICD-10-CM | POA: Diagnosis not present

## 2020-02-22 DIAGNOSIS — I25119 Atherosclerotic heart disease of native coronary artery with unspecified angina pectoris: Secondary | ICD-10-CM

## 2020-02-22 DIAGNOSIS — E785 Hyperlipidemia, unspecified: Secondary | ICD-10-CM | POA: Insufficient documentation

## 2020-02-22 DIAGNOSIS — Z882 Allergy status to sulfonamides status: Secondary | ICD-10-CM | POA: Diagnosis not present

## 2020-02-22 DIAGNOSIS — Z880 Allergy status to penicillin: Secondary | ICD-10-CM | POA: Diagnosis not present

## 2020-02-22 DIAGNOSIS — I25118 Atherosclerotic heart disease of native coronary artery with other forms of angina pectoris: Secondary | ICD-10-CM | POA: Insufficient documentation

## 2020-02-22 DIAGNOSIS — Z79899 Other long term (current) drug therapy: Secondary | ICD-10-CM | POA: Diagnosis not present

## 2020-02-22 DIAGNOSIS — I10 Essential (primary) hypertension: Secondary | ICD-10-CM | POA: Diagnosis not present

## 2020-02-22 DIAGNOSIS — I2510 Atherosclerotic heart disease of native coronary artery without angina pectoris: Secondary | ICD-10-CM | POA: Diagnosis present

## 2020-02-22 DIAGNOSIS — E7800 Pure hypercholesterolemia, unspecified: Secondary | ICD-10-CM | POA: Insufficient documentation

## 2020-02-22 DIAGNOSIS — Z881 Allergy status to other antibiotic agents status: Secondary | ICD-10-CM | POA: Diagnosis not present

## 2020-02-22 HISTORY — PX: LEFT HEART CATH AND CORONARY ANGIOGRAPHY: CATH118249

## 2020-02-22 LAB — GLUCOSE, CAPILLARY
Glucose-Capillary: 110 mg/dL — ABNORMAL HIGH (ref 70–99)
Glucose-Capillary: 93 mg/dL (ref 70–99)

## 2020-02-22 SURGERY — LEFT HEART CATH AND CORONARY ANGIOGRAPHY
Anesthesia: LOCAL

## 2020-02-22 MED ORDER — MIDAZOLAM HCL 2 MG/2ML IJ SOLN
INTRAMUSCULAR | Status: AC
  Filled 2020-02-22: qty 2

## 2020-02-22 MED ORDER — SODIUM CHLORIDE 0.9 % WEIGHT BASED INFUSION
3.0000 mL/kg/h | INTRAVENOUS | Status: AC
  Administered 2020-02-22: 3 mL/kg/h via INTRAVENOUS

## 2020-02-22 MED ORDER — HEPARIN SODIUM (PORCINE) 1000 UNIT/ML IJ SOLN
INTRAMUSCULAR | Status: DC | PRN
  Administered 2020-02-22: 4000 [IU] via INTRAVENOUS

## 2020-02-22 MED ORDER — VERAPAMIL HCL 2.5 MG/ML IV SOLN
INTRAVENOUS | Status: DC | PRN
  Administered 2020-02-22: 10 mL via INTRA_ARTERIAL

## 2020-02-22 MED ORDER — SODIUM CHLORIDE 0.9 % WEIGHT BASED INFUSION: 1.0000 mL/kg/h | INTRAVENOUS | Status: DC

## 2020-02-22 MED ORDER — LABETALOL HCL 5 MG/ML IV SOLN: 10.0000 mg | INTRAVENOUS | Status: DC | PRN

## 2020-02-22 MED ORDER — CLOPIDOGREL BISULFATE 75 MG PO TABS: 75.0000 mg | ORAL_TABLET | Freq: Every day | ORAL | 11 refills | Status: DC

## 2020-02-22 MED ORDER — SODIUM CHLORIDE 0.9 % IV SOLN
INTRAVENOUS | Status: AC | PRN
  Administered 2020-02-22: 500 mL via INTRAVENOUS

## 2020-02-22 MED ORDER — SODIUM CHLORIDE 0.9 % IV SOLN: 250.0000 mL | INTRAVENOUS | Status: DC | PRN

## 2020-02-22 MED ORDER — LIDOCAINE HCL (PF) 1 % IJ SOLN
INTRAMUSCULAR | Status: AC
  Filled 2020-02-22: qty 30

## 2020-02-22 MED ORDER — SODIUM CHLORIDE 0.9% FLUSH: 3.0000 mL | INTRAVENOUS | Status: DC | PRN

## 2020-02-22 MED ORDER — ACETAMINOPHEN 325 MG PO TABS: 650.0000 mg | ORAL_TABLET | ORAL | Status: DC | PRN

## 2020-02-22 MED ORDER — VERAPAMIL HCL 2.5 MG/ML IV SOLN
INTRAVENOUS | Status: AC
  Filled 2020-02-22: qty 2

## 2020-02-22 MED ORDER — FENTANYL CITRATE (PF) 100 MCG/2ML IJ SOLN
INTRAMUSCULAR | Status: AC
  Filled 2020-02-22: qty 2

## 2020-02-22 MED ORDER — MIDAZOLAM HCL 2 MG/2ML IJ SOLN
INTRAMUSCULAR | Status: DC | PRN
  Administered 2020-02-22: 2 mg via INTRAVENOUS

## 2020-02-22 MED ORDER — IOHEXOL 350 MG/ML SOLN
INTRAVENOUS | Status: DC | PRN
  Administered 2020-02-22: 60 mL via INTRACARDIAC

## 2020-02-22 MED ORDER — ONDANSETRON HCL 4 MG/2ML IJ SOLN: 4.0000 mg | Freq: Four times a day (QID) | INTRAMUSCULAR | Status: DC | PRN

## 2020-02-22 MED ORDER — SODIUM CHLORIDE 0.9% FLUSH: 3.0000 mL | Freq: Two times a day (BID) | INTRAVENOUS | Status: DC

## 2020-02-22 MED ORDER — HEPARIN (PORCINE) IN NACL 2-0.9 UNITS/ML
INTRAMUSCULAR | Status: AC | PRN
  Administered 2020-02-22: 500 mL via INTRAVENOUS

## 2020-02-22 MED ORDER — ASPIRIN 81 MG PO CHEW: 81.0000 mg | CHEWABLE_TABLET | ORAL | Status: DC

## 2020-02-22 MED ORDER — LIDOCAINE HCL (PF) 1 % IJ SOLN
INTRAMUSCULAR | Status: DC | PRN
  Administered 2020-02-22: 2 mL

## 2020-02-22 MED ORDER — FENTANYL CITRATE (PF) 100 MCG/2ML IJ SOLN
INTRAMUSCULAR | Status: DC | PRN
  Administered 2020-02-22: 25 ug via INTRAVENOUS

## 2020-02-22 MED ORDER — HEPARIN SODIUM (PORCINE) 1000 UNIT/ML IJ SOLN
INTRAMUSCULAR | Status: AC
  Filled 2020-02-22: qty 1

## 2020-02-22 MED ORDER — CLOPIDOGREL BISULFATE 300 MG PO TABS
300.0000 mg | ORAL_TABLET | Freq: Once | ORAL | Status: AC
  Administered 2020-02-22: 300 mg via ORAL
  Filled 2020-02-22: qty 1

## 2020-02-22 MED ORDER — HYDRALAZINE HCL 20 MG/ML IJ SOLN: 10.0000 mg | INTRAMUSCULAR | Status: DC | PRN

## 2020-02-22 MED ORDER — HEPARIN (PORCINE) IN NACL 1000-0.9 UT/500ML-% IV SOLN
INTRAVENOUS | Status: AC
  Filled 2020-02-22: qty 1500

## 2020-02-22 MED ORDER — HEPARIN (PORCINE) IN NACL 1000-0.9 UT/500ML-% IV SOLN
INTRAVENOUS | Status: DC | PRN
  Administered 2020-02-22 (×2): 500 mL

## 2020-02-22 SURGICAL SUPPLY — 10 items
CATH 5FR JL3.5 JR4 ANG PIG MP (CATHETERS) ×2 IMPLANT
DEVICE RAD COMP TR BAND LRG (VASCULAR PRODUCTS) ×2 IMPLANT
GLIDESHEATH SLEND SS 6F .021 (SHEATH) ×2 IMPLANT
GUIDEWIRE INQWIRE 1.5J.035X260 (WIRE) ×1 IMPLANT
INQWIRE 1.5J .035X260CM (WIRE) ×2
KIT HEART LEFT (KITS) ×2 IMPLANT
PACK CARDIAC CATHETERIZATION (CUSTOM PROCEDURE TRAY) ×2 IMPLANT
TRANSDUCER W/STOPCOCK (MISCELLANEOUS) ×2 IMPLANT
TUBING CIL FLEX 10 FLL-RA (TUBING) ×2 IMPLANT
WIRE HI TORQ VERSACORE-J 145CM (WIRE) ×2 IMPLANT

## 2020-02-22 NOTE — Discharge Instructions (Signed)
Radial Site Care  This sheet gives you information about how to care for yourself after your procedure. Your health care provider may also give you more specific instructions. If you have problems or questions, contact your health care provider. What can I expect after the procedure? After the procedure, it is common to have:  Bruising and tenderness at the catheter insertion area. Follow these instructions at home: Medicines  Take over-the-counter and prescription medicines only as told by your health care provider. Insertion site care  Follow instructions from your health care provider about how to take care of your insertion site. Make sure you: ? Wash your hands with soap and water before you change your bandage (dressing). If soap and water are not available, use hand sanitizer. ? Change your dressing as told by your health care provider.  Check your insertion site every day for signs of infection. Check for: ? Redness, swelling, or pain. ? Fluid or blood. ? Pus or a bad smell. ? Warmth.  Do not take baths, swim, or use a hot tub until your health care provider approves.  You may shower 24-48 hours after the procedure, or as directed by your health care provider. ? Remove the dressing and gently wash the site with plain soap and water. ? Pat the area dry with a clean towel. ? Do not rub the site. That could cause bleeding.  Do not apply powder or lotion to the site. Activity   For 24 hours after the procedure, or as directed by your health care provider: ? Do not flex or bend the affected arm. ? Do not push or pull heavy objects with the affected arm. ? Do not drive yourself home from the hospital or clinic. You may drive 24 hours after the procedure unless your health care provider tells you not to. ? Do not operate machinery or power tools.  Do not lift anything that is heavier than 10 lb (4.5 kg), or the limit that you are told, until your health care provider says  that it is safe.  Ask your health care provider when it is okay to: ? Return to work or school. ? Resume usual physical activities or sports. ? Resume sexual activity. General instructions  If the catheter site starts to bleed, raise your arm and put firm pressure on the site. If the bleeding does not stop, get help right away. This is a medical emergency.  If you went home on the same day as your procedure, a responsible adult should be with you for the first 24 hours after you arrive home.  Keep all follow-up visits as told by your health care provider. This is important. Contact a health care provider if:  You have a fever.  You have redness, swelling, or yellow drainage around your insertion site. Get help right away if:  You have unusual pain at the radial site.  The catheter insertion area swells very fast.  The insertion area is bleeding, and the bleeding does not stop when you hold steady pressure on the area.  Your arm or hand becomes pale, cool, tingly, or numb. These symptoms may represent a serious problem that is an emergency. Do not wait to see if the symptoms will go away. Get medical help right away. Call your local emergency services (911 in the [place] Do not drive yourself to the hospital. Summary  After the procedure, it is common to have bruising and tenderness at the site.  Follow instructions from your  health care provider about how to take care of your radial site wound. Check the wound every day for signs of infection.  Do not lift anything that is heavier than 10 lb (4.5 kg), or the limit that you are told, until your health care provider says that it is safe. This information is not intended to replace advice given to you by your health care provider. Make sure you discuss any questions you have with your health care provider. Document Revised: 11/30/2017 Document Reviewed: 11/30/2017 Elsevier Patient Education  [place].   Moderate  Conscious Sedation, Adult, Care After These instructions provide you with information about caring for yourself after your procedure. Your health care provider may also give you more specific instructions. Your treatment has been planned according to current medical practices, but problems sometimes occur. Call your health care provider if you have any problems or questions after your procedure. What can I expect after the procedure? After your procedure, it is common:  To feel sleepy for several hours.  To feel clumsy and have poor balance for several hours.  To have poor judgment for several hours.  To vomit if you eat too soon. Follow these instructions at home: For at least 24 hours after the procedure:   Do not: ? Participate in activities where you could fall or become injured. ? Drive. ? Use heavy machinery. ? Drink alcohol. ? Take sleeping pills or medicines that cause drowsiness. ? Make important decisions or sign legal documents. ? Take care of children on your own.  Rest. Eating and drinking  Follow the diet recommended by your health care provider.  If you vomit: ? Drink water, juice, or soup when you can drink without vomiting. ? Make sure you have little or no nausea before eating solid foods. General instructions  Have a responsible adult stay with you until you are awake and alert.  Take over-the-counter and prescription medicines only as told by your health care provider.  If you smoke, do not smoke without supervision.  Keep all follow-up visits as told by your health care provider. This is important. Contact a health care provider if:  You keep feeling nauseous or you keep vomiting.  You feel light-headed.  You develop a rash.  You have a fever. Get help right away if:  You have trouble breathing. This information is not intended to replace advice given to you by your health care provider. Make sure you discuss any questions you have with your  health care provider. Document Revised: 10/07/2017 Document Reviewed: 02/14/2016 Elsevier Patient Education  2020 [employer].

## 2020-02-22 NOTE — Interval H&P Note (Signed)
History and Physical Interval Note:  02/22/2020 7:27 AM  Gloria [name]  has presented today for surgery, with the diagnosis of cad.  The various methods of treatment have been discussed with the patient and family. After consideration of risks, benefits and other options for treatment, the patient has consented to  Procedure(s): LEFT HEART CATH AND CORONARY ANGIOGRAPHY (N/A) as a surgical intervention.  The patient's history has been reviewed, patient examined, no change in status, stable for surgery.  I have reviewed the patient's chart and labs.  Questions were answered to the patient's satisfaction.    Cath Lab Visit (complete for each Cath Lab visit)  Clinical Evaluation Leading to the Procedure:   ACS: No.  Non-ACS:    Anginal Classification: CCS III  Anti-ischemic medical therapy: No Therapy  Non-Invasive Test Results: High-risk stress test findings: cardiac mortality >3%/year  Prior CABG: No previous CABG       [name]

## 2020-02-26 ENCOUNTER — Observation Stay (HOSPITAL_COMMUNITY)
Admission: RE | Admit: 2020-02-26 | Discharge: 2020-02-27 | Disposition: A | Discharge status: Home / Self Care | Payer: Medicare PPO | Attending: Cardiovascular Disease | Admitting: Cardiovascular Disease

## 2020-02-26 ENCOUNTER — Other Ambulatory Visit: Payer: Self-pay

## 2020-02-26 ENCOUNTER — Encounter (HOSPITAL_COMMUNITY): Payer: Self-pay | Admitting: Cardiovascular Disease

## 2020-02-26 ENCOUNTER — Encounter (HOSPITAL_COMMUNITY)
Admission: RE | Disposition: A | Discharge status: Home / Self Care | Payer: Medicare PPO | Source: Home / Self Care | Attending: Cardiovascular Disease

## 2020-02-26 DIAGNOSIS — I2584 Coronary atherosclerosis due to calcified coronary lesion: Secondary | ICD-10-CM | POA: Diagnosis not present

## 2020-02-26 DIAGNOSIS — Z794 Long term (current) use of insulin: Secondary | ICD-10-CM | POA: Diagnosis not present

## 2020-02-26 DIAGNOSIS — Z882 Allergy status to sulfonamides status: Secondary | ICD-10-CM | POA: Insufficient documentation

## 2020-02-26 DIAGNOSIS — I25119 Atherosclerotic heart disease of native coronary artery with unspecified angina pectoris: Secondary | ICD-10-CM | POA: Diagnosis not present

## 2020-02-26 DIAGNOSIS — E119 Type 2 diabetes mellitus without complications: Secondary | ICD-10-CM | POA: Diagnosis not present

## 2020-02-26 DIAGNOSIS — Z79899 Other long term (current) drug therapy: Secondary | ICD-10-CM | POA: Insufficient documentation

## 2020-02-26 DIAGNOSIS — Z880 Allergy status to penicillin: Secondary | ICD-10-CM | POA: Insufficient documentation

## 2020-02-26 DIAGNOSIS — Z881 Allergy status to other antibiotic agents status: Secondary | ICD-10-CM | POA: Insufficient documentation

## 2020-02-26 DIAGNOSIS — Z833 Family history of diabetes mellitus: Secondary | ICD-10-CM | POA: Diagnosis not present

## 2020-02-26 DIAGNOSIS — Z955 Presence of coronary angioplasty implant and graft: Secondary | ICD-10-CM

## 2020-02-26 DIAGNOSIS — E785 Hyperlipidemia, unspecified: Secondary | ICD-10-CM | POA: Diagnosis not present

## 2020-02-26 DIAGNOSIS — Z7982 Long term (current) use of aspirin: Secondary | ICD-10-CM | POA: Insufficient documentation

## 2020-02-26 DIAGNOSIS — E7800 Pure hypercholesterolemia, unspecified: Secondary | ICD-10-CM | POA: Diagnosis not present

## 2020-02-26 DIAGNOSIS — Z8249 Family history of ischemic heart disease and other diseases of the circulatory system: Secondary | ICD-10-CM | POA: Diagnosis not present

## 2020-02-26 DIAGNOSIS — I10 Essential (primary) hypertension: Secondary | ICD-10-CM | POA: Insufficient documentation

## 2020-02-26 DIAGNOSIS — R079 Chest pain, unspecified: Secondary | ICD-10-CM | POA: Diagnosis present

## 2020-02-26 DIAGNOSIS — I2510 Atherosclerotic heart disease of native coronary artery without angina pectoris: Secondary | ICD-10-CM | POA: Diagnosis present

## 2020-02-26 HISTORY — PX: CORONARY ATHERECTOMY: CATH118238

## 2020-02-26 LAB — BASIC METABOLIC PANEL
Anion gap: 9 (ref 5–15)
BUN: 17 mg/dL (ref 8–23)
CO2: 28 mmol/L (ref 22–32)
Calcium: 10.9 mg/dL — ABNORMAL HIGH (ref 8.9–10.3)
Chloride: 101 mmol/L (ref 98–111)
Creatinine, Ser: 0.59 mg/dL (ref 0.44–1.00)
GFR calc Af Amer: >60 mL/min (ref >60)
GFR calc non Af Amer: >60 mL/min (ref >60)
Glucose, Bld: 145 mg/dL — ABNORMAL HIGH (ref 70–99)
Potassium: 3.9 mmol/L (ref 3.5–5.1)
Sodium: 138 mmol/L (ref 135–145)

## 2020-02-26 LAB — CBC
HCT: 46.2 % — ABNORMAL HIGH (ref 36.0–46.0)
Hemoglobin: 15.4 g/dL — ABNORMAL HIGH (ref 12.0–15.0)
MCH: 30.9 pg (ref 26.0–34.0)
MCHC: 33.3 g/dL (ref 30.0–36.0)
MCV: 92.8 fL (ref 80.0–100.0)
Platelets: 232 10*3/uL (ref 150–400)
RBC: 4.98 MIL/uL (ref 3.87–5.11)
RDW: 13.2 % (ref 11.5–15.5)
WBC: 4.1 10*3/uL (ref 4.0–10.5)
nRBC: 0 % (ref 0.0–0.2)

## 2020-02-26 LAB — GLUCOSE, CAPILLARY
Glucose-Capillary: 145 mg/dL — ABNORMAL HIGH (ref 70–99)
Glucose-Capillary: 127 mg/dL — ABNORMAL HIGH (ref 70–99)
Glucose-Capillary: 91 mg/dL (ref 70–99)
Glucose-Capillary: 152 mg/dL — ABNORMAL HIGH (ref 70–99)

## 2020-02-26 LAB — POCT ACTIVATED CLOTTING TIME
Activated Clotting Time: 213 seconds
Activated Clotting Time: 219 seconds
Activated Clotting Time: 285 seconds

## 2020-02-26 SURGERY — CORONARY ATHERECTOMY
Anesthesia: LOCAL

## 2020-02-26 MED ORDER — MIDAZOLAM HCL 2 MG/2ML IJ SOLN
INTRAMUSCULAR | Status: DC | PRN
  Administered 2020-02-26: 1 mg via INTRAVENOUS

## 2020-02-26 MED ORDER — LIDOCAINE HCL (PF) 1 % IJ SOLN
INTRAMUSCULAR | Status: DC | PRN
  Administered 2020-02-26: 15 mL

## 2020-02-26 MED ORDER — LIDOCAINE HCL (PF) 1 % IJ SOLN
INTRAMUSCULAR | Status: AC
  Filled 2020-02-26: qty 30

## 2020-02-26 MED ORDER — FENTANYL CITRATE (PF) 100 MCG/2ML IJ SOLN
INTRAMUSCULAR | Status: AC
  Filled 2020-02-26: qty 2

## 2020-02-26 MED ORDER — HEPARIN (PORCINE) IN NACL 1000-0.9 UT/500ML-% IV SOLN
INTRAVENOUS | Status: AC
  Filled 2020-02-26: qty 1000

## 2020-02-26 MED ORDER — NITROGLYCERIN 1 MG/10 ML FOR IR/CATH LAB
INTRA_ARTERIAL | Status: AC
  Filled 2020-02-26: qty 10

## 2020-02-26 MED ORDER — SODIUM CHLORIDE 0.9 % WEIGHT BASED INFUSION: 1.0000 mL/kg/h | INTRAVENOUS | Status: AC

## 2020-02-26 MED ORDER — HEPARIN SODIUM (PORCINE) 1000 UNIT/ML IJ SOLN
INTRAMUSCULAR | Status: AC
  Filled 2020-02-26: qty 1

## 2020-02-26 MED ORDER — HEPARIN (PORCINE) IN NACL 1000-0.9 UT/500ML-% IV SOLN
INTRAVENOUS | Status: DC | PRN
  Administered 2020-02-26 (×2): 500 mL

## 2020-02-26 MED ORDER — ASPIRIN 81 MG PO CHEW: 81.0000 mg | CHEWABLE_TABLET | ORAL | Status: DC

## 2020-02-26 MED ORDER — CLOPIDOGREL BISULFATE 75 MG PO TABS: 75.0000 mg | ORAL_TABLET | ORAL | Status: DC

## 2020-02-26 MED ORDER — SODIUM CHLORIDE 0.9% FLUSH: 3.0000 mL | INTRAVENOUS | Status: DC | PRN

## 2020-02-26 MED ORDER — HEPARIN SODIUM (PORCINE) 1000 UNIT/ML IJ SOLN
INTRAMUSCULAR | Status: DC | PRN
  Administered 2020-02-26: 6000 [IU] via INTRAVENOUS
  Administered 2020-02-26: 3000 [IU] via INTRAVENOUS
  Administered 2020-02-26: 4000 [IU] via INTRAVENOUS

## 2020-02-26 MED ORDER — SODIUM CHLORIDE 0.9 % WEIGHT BASED INFUSION: 1.0000 mL/kg/h | INTRAVENOUS | Status: DC

## 2020-02-26 MED ORDER — MIDAZOLAM HCL 2 MG/2ML IJ SOLN
INTRAMUSCULAR | Status: AC
  Filled 2020-02-26: qty 2

## 2020-02-26 MED ORDER — SODIUM CHLORIDE 0.9 % IV SOLN: 250.0000 mL | INTRAVENOUS | Status: DC | PRN

## 2020-02-26 MED ORDER — HYDRALAZINE HCL 20 MG/ML IJ SOLN: 10.0000 mg | INTRAMUSCULAR | Status: AC | PRN

## 2020-02-26 MED ORDER — NITROGLYCERIN 1 MG/10 ML FOR IR/CATH LAB
INTRA_ARTERIAL | Status: DC | PRN
  Administered 2020-02-26: 100 ug via INTRACORONARY
  Administered 2020-02-26: 150 ug via INTRACORONARY

## 2020-02-26 MED ORDER — LABETALOL HCL 5 MG/ML IV SOLN: 10.0000 mg | INTRAVENOUS | Status: AC | PRN

## 2020-02-26 MED ORDER — DEXTROSE 5 % IV SOLN
INTRAVENOUS | Status: AC | PRN
  Administered 2020-02-26: 340 mg via INTRAVENOUS

## 2020-02-26 MED ORDER — SODIUM CHLORIDE 0.9 % WEIGHT BASED INFUSION
3.0000 mL/kg/h | INTRAVENOUS | Status: AC
  Administered 2020-02-26: 3 mL/kg/h via INTRAVENOUS

## 2020-02-26 MED ORDER — FENTANYL CITRATE (PF) 100 MCG/2ML IJ SOLN
INTRAMUSCULAR | Status: DC | PRN
  Administered 2020-02-26 (×2): 25 ug via INTRAVENOUS

## 2020-02-26 MED ORDER — SODIUM CHLORIDE 0.9% FLUSH: 3.0000 mL | Freq: Two times a day (BID) | INTRAVENOUS | Status: DC

## 2020-02-26 MED ORDER — ONDANSETRON HCL 4 MG/2ML IJ SOLN: 4.0000 mg | Freq: Four times a day (QID) | INTRAMUSCULAR | Status: DC | PRN

## 2020-02-26 MED ORDER — SODIUM CHLORIDE 0.9 % IV SOLN
Freq: Once | INTRAVENOUS | Status: DC
  Filled 2020-02-26: qty 13.6

## 2020-02-26 MED ORDER — LIDOCAINE-EPINEPHRINE 1 %-1:100000 IJ SOLN
INTRAMUSCULAR | Status: AC
  Filled 2020-02-26: qty 1

## 2020-02-26 MED ORDER — SODIUM CHLORIDE 0.9 % IV SOLN
INTRAVENOUS | Status: AC | PRN
  Administered 2020-02-26: 500 mL via INTRAVENOUS
  Administered 2020-02-26: 67 mL/h via INTRAVENOUS

## 2020-02-26 MED ORDER — IOHEXOL 350 MG/ML SOLN
INTRAVENOUS | Status: AC
  Filled 2020-02-26: qty 1

## 2020-02-26 MED ORDER — ACETAMINOPHEN 325 MG PO TABS: 650.0000 mg | ORAL_TABLET | ORAL | Status: DC | PRN

## 2020-02-26 SURGICAL SUPPLY — 23 items
BALLN SAPPHIRE 3.0X12 (BALLOONS) ×2
BALLN SAPPHIRE ~~LOC~~ 3.75X8 (BALLOONS) ×2 IMPLANT
BALLOON SAPPHIRE 3.0X12 (BALLOONS) ×1 IMPLANT
CATH LAUNCHER 6FR AL.75 (CATHETERS) ×2 IMPLANT
CATHETER LAUNCHER 6FR JR4 SH (CATHETERS) ×2 IMPLANT
CROWN DIAMONDBACK CLASSIC 1.25 (BURR) ×2 IMPLANT
DEVICE CLOSURE PERCLS PRGLD 6F (VASCULAR PRODUCTS) ×1 IMPLANT
FEM STOP ARCH (HEMOSTASIS) ×2
KIT ENCORE 26 ADVANTAGE (KITS) ×2 IMPLANT
KIT HEART LEFT (KITS) ×2 IMPLANT
LUBRICANT VIPERSLIDE CORONARY (MISCELLANEOUS) ×2 IMPLANT
PACK CARDIAC CATHETERIZATION (CUSTOM PROCEDURE TRAY) ×2 IMPLANT
PATCH THROMBIX TOPICAL PLAIN (HEMOSTASIS) ×2 IMPLANT
PERCLOSE PROGLIDE 6F (VASCULAR PRODUCTS) ×2
SHEATH PINNACLE 6F 10CM (SHEATH) ×2 IMPLANT
SHEATH PROBE COVER 6X72 (BAG) ×2 IMPLANT
STENT RESOLUTE ONYX 3.5X12 (Permanent Stent) ×2 IMPLANT
SYSTEM COMPRESSION FEMOSTOP (HEMOSTASIS) ×1 IMPLANT
TRANSDUCER W/STOPCOCK (MISCELLANEOUS) ×2 IMPLANT
TUBING CIL FLEX 10 FLL-RA (TUBING) ×2 IMPLANT
WIRE COUGAR XT STRL 190CM (WIRE) ×2 IMPLANT
WIRE EMERALD 3MM-J .035X150CM (WIRE) ×2 IMPLANT
WIRE VIPERWIRE COR FLEX .012 (WIRE) ×2 IMPLANT

## 2020-02-26 NOTE — Interval H&P Note (Signed)
Cath Lab Visit (complete for each Cath Lab visit)  Clinical Evaluation Leading to the Procedure:   ACS: No.  Non-ACS:    Anginal Classification: CCS III  Anti-ischemic medical therapy: No Therapy  Non-Invasive Test Results: High-risk stress test findings: cardiac mortality >3%/year  Prior CABG: No previous CABG  Pt presents for atherectomy and stenting of the RCA after undergoing cardiac cath showing severe single vessel CAD. She has been loaded with clopidogrel. Plan femoral access.  History and Physical Interval Note:  02/26/2020 6:22 AM  Gloria [name]  has presented today for surgery, with the diagnosis of CAD.  The various methods of treatment have been discussed with the patient and family. After consideration of risks, benefits and other options for treatment, the patient has consented to  Procedure(s): CORONARY ATHERECTOMY (N/A) as a surgical intervention.  The patient's history has been reviewed, patient examined, no change in status, stable for surgery.  I have reviewed the patient's chart and labs.  Questions were answered to the patient's satisfaction.     [name]

## 2020-02-26 NOTE — Progress Notes (Addendum)
Rt groin now has the femstop removed. Pressure dressing and a tegaderm to rt groin site. No hematoma or oozing from site. Colletta Maryland -RN pt nurse is updated. Pt leaves cath lab in stable condition. 2+ rt dp/pt

## 2020-02-26 NOTE — Progress Notes (Signed)
I am unable  to remove femstop from Rt groin due to pt oozing. Dr Burt Knack in to evaluate femstop. Rt groin is a level 0 and Rt dp/pt 2+. No bruising or hematoma.

## 2020-02-26 NOTE — Progress Notes (Signed)
Continued groin ooze. No hematoma. Patient comfortable with stable hemodynamics. Plan continue light femostop until oozing stops. Change from same day PCI to extended recovery in 6E plan overnight stay. Communicated plan with son, [place], over the phone. Advised RN would call when patient has a room assignment.   [name] 02/26/2020 1:00 PM

## 2020-02-26 NOTE — Progress Notes (Signed)
Tension applied to rt femstop band on rt groin is slightly released and no oozing from Rt groin site. Rt groin is soft. No hematoma or bruising. Rt dp/pt 2+. Pt is resting comfortably.

## 2020-02-26 NOTE — Discharge Instructions (Signed)
  HOLD METFORMIN FOR A FULL 48 HOURS AFTER DISCHARGE.  DRINK PLENTY OF FLUIDS FOR THE NEXT 2-3 DAYS.

## 2020-02-26 NOTE — Discharge Summary (Addendum)
The patient has been seen in conjunction with Cecilie Kicks, NP. All aspects of care have been considered and discussed. The patient has been personally interviewed, examined, and all clinical data has been reviewed.    Class 3 angina caused by heavily calcified RCA treated with OA and stenting with good result.  Had oozing from te right femoral after Perclose. Has ecchymosis but no hematoma this AM  Ambulation without difficulty.  Discharge today and f/u as outlined below.   Discharge Summary   Patient ID: Gloria [name] MRN: [id number]; DOB: 09-12-1946  Admit date: 02/26/2020 Discharge date: 02/27/2020  Primary Care Provider: Tonia Ghent, MD  Primary Cardiologist: Fransico Him, MD  Primary Electrophysiologist:  None   Discharge Diagnoses    Active Problems:   Coronary artery disease involving native coronary artery of native heart with angina pectoris [facility])   Chest pain    Diagnostic Studies/Procedures    Cardiac Catheterization 02/27/2020:   Prox LAD to Mid LAD lesion is 50% stenosed.  Mid Cx lesion is 40% stenosed.  3rd Mrg lesion is 80% stenosed.  Prox RCA to Mid RCA lesion is 95% stenosed.  A drug-eluting stent was successfully placed using a STENT RESOLUTE ONYX 3.5X12.  Post intervention, there is a 0% residual stenosis.   Successful atherectomy and stenting of severe calcific stenosis of the mid right coronary artery, reducing the lesion from 95% to 0% with TIMI-3 flow both pre and post procedure.  Diagnostic Dominance: Right  Intervention     _____________   History of Present Illness     Gloria [name] is a 74 y.o. female with a hx of DM2, heart murmur with normal echo in 2011 and HTN. She recently complained to her PCP that she was having problems with DOE when walking that she first noticed last fall when walking with her daughter in the neighborhood. She said that she was fine on flat ground but if she waked up an incline she would  have to stop due to SOB and chest pressure. This only occurred once last fall until a month prior to her office visit when a similar episode occurred again. This had gotten progressively worse to the point that every time she started to walk she would have DOE and chest pressure if she was going up an incline or up stairs. She did have asthma as a child. She said that occasionally the chest pressure would radiate to the back of her neck but no associated diaphoresis or nausea. She denied any palpitations, PND, orthopnea, LE edema, dizziness or syncope. She has no significant tobacco use except experimenting with it in her 20's. She does have a fm hx of CAD on both sides of her family. Her mother died in her 24's of an MI and thoracic aortic aneurysm.  She had cardiac CTA done and >70% stenosis in RCA, LAD, LCX  and >50% LM she was placed on ASA 81 mg daily.  She was recently seen in the office for follow up. Cardiac CTA was reviewed and cardiac cath with risks and benefits and possible results.    Outpatient cardiac catheterization was arranged for further evaluation. Underwent cardiac cath on 02/22/20 with severe single vessel disease of the RCA but heavily calcified. She was started on plavix and planned for staged atherectomy.   Hospital Course     The patient underwent cardiac cath as noted above with successful atherectomy/DES to the [facility] reducing lesion from 95% to 0% with TIMI 3  flow. Plan for DAPT with ASA/plavix for at least 6 months. Developed oozing from femoral cath site and required a femstop. Given this she was admitted overnight for observation. Morning labs were stable. Able to ambulate with cardiac rehab without recurrence chest pain.   Gloria [name] was seen by Dr. Burt Knack and determined stable for discharge home. Follow up with our office has been arranged. Instructions/precautions regarding cath site care were given prior to discharge. Medications are listed below. Pertinent  changes include n/a.  _____________  Did the patient have an acute coronary syndrome (MI, NSTEMI, STEMI, etc) this admission?:  No                               Did the patient have a percutaneous coronary intervention (stent / angioplasty)?:  Yes.     Cath/PCI Registry Performance & Quality Measures: 5. Aspirin prescribed? - Yes 6. ADP Receptor Inhibitor (Plavix/Clopidogrel, Brilinta/Ticagrelor or Effient/Prasugrel) prescribed (includes medically managed patients)? - Yes 7. High Intensity Statin (Lipitor 40-67m or Crestor 20-465m prescribed? - No - consider increasing at follow up if continues to tolerate current dose 8. For EF <40%, was ACEI/ARB prescribed? - Not Applicable (EF >/= 4000%9. For EF <40%, Aldosterone Antagonist (Spironolactone or Eplerenone) prescribed? - Not Applicable (EF >/= 4086%10. Cardiac Rehab Phase II ordered (Included Medically managed Patients)? - Yes   _____________   Discharge Vitals Blood pressure 127/67, pulse 81, temperature 98.1 F (36.7 C), temperature source Oral, resp. rate 18, height '5\' 6"'  (1.676 m), weight 68.6 kg, last menstrual period 08/08/2009, SpO2 98 %.  Filed Weights   02/26/20 0618 02/27/20 0412  Weight: 68 kg 68.6 kg    Last Labs & Radiologic Studies    CBC Recent Labs    02/26/20 0625 02/27/20 0424  WBC 4.1 4.8  HGB 15.4* 14.6  HCT 46.2* 43.8  MCV 92.8 92.0  PLT 232 20761 Basic Metabolic Panel Recent Labs    02/26/20 0625 02/27/20 0424  NA 138 139  K 3.9 4.7  CL 101 109  CO2 28 20*  GLUCOSE 145* 143*  BUN 17 13  CREATININE 0.59 0.52  CALCIUM 10.9* 10.5*   Liver Function Tests No results for input(s): AST, ALT, ALKPHOS, BILITOT, PROT, ALBUMIN in the last 72 hours. No results for input(s): LIPASE, AMYLASE in the last 72 hours. High Sensitivity Troponin:   No results for input(s): TROPONINIHS in the last 720 hours.  BNP Invalid input(s): POCBNP D-Dimer No results for input(s): DDIMER in the last 72  hours. Hemoglobin A1C No results for input(s): HGBA1C in the last 72 hours. Fasting Lipid Panel No results for input(s): CHOL, HDL, LDLCALC, TRIG, CHOLHDL, LDLDIRECT in the last 72 hours. Thyroid Function Tests No results for input(s): TSH, T4TOTAL, T3FREE, THYROIDAB in the last 72 hours.  Invalid input(s): FREET3 _____________  CARDIAC CATHETERIZATION  Result Date: 02/26/2020  Prox LAD to Mid LAD lesion is 50% stenosed.  Mid Cx lesion is 40% stenosed.  3rd Mrg lesion is 80% stenosed.  Prox RCA to Mid RCA lesion is 95% stenosed.  A drug-eluting stent was successfully placed using a STENT RESOLUTE ONYX 3.5X12.  Post intervention, there is a 0% residual stenosis.  Successful atherectomy and stenting of severe calcific stenosis of the mid right coronary artery, reducing the lesion from 95% to 0% with TIMI-3 flow both pre and post procedure.   CARDIAC CATHETERIZATION  Result Date: 02/22/2020 1.  Severe single-vessel coronary artery disease with severe calcific stenosis of a large, dominant RCA 2.  Heavily calcified coronary arteries with multivessel calcification but nonobstructive disease in the left main, LAD, and left circumflex with the exception of severe branch vessel disease in the distal circumflex appropriate for medical therapy 3.  Normal LV function with normal LVEDP, LVEF estimated at 55 to 65% Recommendations: The RCA is amenable to PCI.  The lesion is complex because of severe calcification and involvement of a large acute marginal branch.  Recommend start clopidogrel 300 mg x 1, then 75 mg daily.  Plan for atherectomy and stenting next week.  Plan reviewed with the patient and her husband.  CT CORONARY MORPH W/CTA COR W/SCORE W/CA W/CM &/OR WO/CM  Addendum Date: 02/15/2020   ADDENDUM REPORT: 02/15/2020 08:52 EXAM: OVER-READ INTERPRETATION  CT CHEST The following report is an over-read performed by radiologist Dr. Norlene Duel [facility] on 02/15/2020. This over-read  does not include interpretation of cardiac or coronary anatomy or pathology. The coronary calcium score/coronary CTA interpretation by the cardiologist is attached. COMPARISON:  None. FINDINGS: Cardiovascular: Aortic atherosclerosis. Mild cardiac enlargement. No pericardial effusion. Mediastinum/nodes: No mass or adenopathy identified. Lungs/pleura: Scarring identified within the right upper lobe and right middle lobe. Upper abdomen: No acute abnormality. Musculoskeletal: Moderate multilevel thoracic degenerative disc disease with vacuum disc and ventral endplate spurring. IMPRESSION: 1. No mass or adenopathy identified. 2. Thoracic degenerative disc disease. Electronically Signed   By: Kerby Moors M.D.   On: 02/15/2020 08:52   Result Date: 02/15/2020 CLINICAL DATA:  Hx of chestpain and dyspnea. Family hx of thoracic aortic aneurysm EXAM: Cardiac/Coronary  CTA TECHNIQUE: The patient was scanned on a Siemens Somatoform go.Top scanner. FINDINGS: A retrospective scan was triggered in the descending thoracic aorta. Axial non-contrast 3 mm slices were carried out through the heart. The data set was analyzed on a dedicated work station and scored using the [place]. Gantry rotation speed was 330 msecs and collimation was .6 mm. 126m of metoprolol po, 530miv and 0.8 mg of sl NTG was given. The 3D data set was reconstructed in 5% intervals of the 60-95 % of the R-R cycle. Diastolic phases were analyzed on a dedicated work station using MPR, MIP and VRT modes. The patient received 75 cc of contrast. Aorta: Normal size. Acending and descending aortic wall calcifications. No dissection. Aortic Valve:  Trileaflet. mild calcifications. Coronary Arteries:  Normal coronary origin.  Right dominance. RCA is a large dominant artery that gives rise to PDA and PLA. There is heavily calcified plaque thoughout the vessel causing severe stenosis (>70% ) Left main is a large artery that gives rise to LAD and LCX arteries. LAD is  a large vessel that has heavily calcified plaque in the proximal, mid and distal segments causing severe stenosis (>70% ) LCX is a non-dominant artery that gives rise to 2 obtuse marginal branches. There is heavily calcified plaque in the proximal to mid segments causing severe stenosis (>70% ) Other findings: Normal pulmonary vein drainage into the left atrium. Normal left atrial appendage without a thrombus. Normal size of the pulmonary artery. IMPRESSION: 1. Coronary calcium score of 6179. This was 9928ercentile for age and sex matched control. 2. Normal ascending aorta size 3. Normal coronary origin with right dominance. 4. Heavily calcified multivessel coronary artery disease causing severe stenosis (>70%) 5. CAD-RADS 4 Severe stenosis. (70-99% or > 50% left main). Cardiac catheterization or CT FFR is recommended. Consider symptom-guided anti-ischemic  pharmacotherapy as well as risk factor modification per guideline directed care. Additional analysis with CT FFR will be submitted and reported separately. Electronically Signed: By: Kate Sable M.D. On: 02/14/2020 17:17    Disposition   Pt is being discharged home today in good condition.  Follow-up Plans & Appointments    Follow-up Information    Isaiah Serge, NP Follow up on 03/12/2020.   Specialties: Cardiology, Radiology Why: at 1:45pm for your follow up appt. Contact information: [address] [place] [address] [phone]          Discharge Instructions    Call MD for:  redness, tenderness, or signs of infection (pain, swelling, redness, odor or green/yellow discharge around incision site)   Complete by: As directed    Diet - low sodium heart healthy   Complete by: As directed    Discharge instructions   Complete by: As directed    Groin Site Care Refer to this sheet in the next few weeks. These instructions provide you with information on caring for yourself after your procedure. Your caregiver may  also give you more specific instructions. Your treatment has been planned according to current medical practices, but problems sometimes occur. Call your caregiver if you have any problems or questions after your procedure. HOME CARE INSTRUCTIONS You may shower 24 hours after the procedure. Remove the bandage (dressing) and gently wash the site with plain soap and water. Gently pat the site dry.  Do not apply powder or lotion to the site.  Do not sit in a bathtub, swimming pool, or whirlpool for 5 to 7 days.  No bending, squatting, or lifting anything over 10 pounds (4.5 kg) as directed by your caregiver.  Inspect the site at least twice daily.  Do not drive home if you are discharged the same day of the procedure. Have someone else drive you.  You may drive 24 hours after the procedure unless otherwise instructed by your caregiver.  What to expect: Any bruising will usually fade within 1 to 2 weeks.  Blood that collects in the tissue (hematoma) may be painful to the touch. It should usually decrease in size and tenderness within 1 to 2 weeks.  SEEK IMMEDIATE MEDICAL CARE IF: You have unusual pain at the groin site or down the affected leg.  You have redness, warmth, swelling, or pain at the groin site.  You have drainage (other than a small amount of blood on the dressing).  You have chills.  You have a fever or persistent symptoms for more than 72 hours.  You have a fever and your symptoms suddenly get worse.  Your leg becomes pale, cool, tingly, or numb.  You have heavy bleeding from the site. Hold pressure on the site. Marland Kitchen  PLEASE DO NOT MISS ANY DOSES OF YOUR PLAVIX!!!!! Also keep a log of you blood pressures and bring back to your follow up appt. Please call the office with any questions.   Patients taking blood thinners should generally stay away from medicines like ibuprofen, Advil, Motrin, naproxen, and Aleve due to risk of stomach bleeding. You may take Tylenol as directed or talk to  your primary doctor about alternatives.  Some studies suggest Prilosec/Omeprazole interacts with Plavix. We changed your Prilosec/Omeprazole to the equivalent dose of Protonix for less chance of interaction.   Increase activity slowly   Complete by: As directed        Discharge Medications   Allergies as of 02/27/2020  Reactions   Sulfonamide Derivatives Other (See Comments)   REACTION: unspecified Childhood   Ciprofloxin Hcl [ciprofloxacin] Rash   itching   Penicillins Rash   REACTION: rash      Medication List    TAKE these medications   Accu-Chek Aviva Plus test strip Generic drug: glucose blood CHECK BLOOD SUGAR FOUR TIMES DAILY AS NEEDED   Accu-Chek Aviva Plus w/Device Kit Use to check blood sugar 4 times daily as needed.  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   Align 4 MG Caps Take 4 mg by mouth daily.   aspirin EC 81 MG tablet Take 1 tablet (81 mg total) by mouth daily. What changed: when to take this   atorvastatin 10 MG tablet Commonly known as: LIPITOR TAKE 1 TABLET BY MOUTH DAILY What changed: when to take this   B-D ULTRAFINE III SHORT PEN 31G X 8 MM Misc Generic drug: Insulin Pen Needle USE AS DIRECTED  ** DUE FOR OFFICE VISIT/PHYSICAL   clopidogrel 75 MG tablet Commonly known as: Plavix Take 1 tablet (75 mg total) by mouth daily.   hydrochlorothiazide 25 MG tablet Commonly known as: HYDRODIURIL TAKE ONE TABLET EVERY DAY   Levemir FlexTouch 100 UNIT/ML FlexPen Generic drug: insulin detemir INJECT 66 UNITS SUBCUTANEOUSLY AT BEDTIME What changed:   how much to take  how to take this  when to take this  additional instructions   METAMUCIL PO Take 1 tablet by mouth daily as needed (Constipation).   metFORMIN 1000 MG tablet Commonly known as: GLUCOPHAGE TAKE 1 TABLET BY MOUTH TWICE DAILY WITH MEALS What  changed: when to take this   quinapril 20 MG tablet Commonly known as: ACCUPRIL TAKE ONE TABLET BY MOUTH TWICE DAILY   vitamin C 500 MG tablet Commonly known as: ASCORBIC ACID Take 500 mg by mouth daily.   Vitamin D 50 MCG (2000 UT) Caps Take 2,000 Units by mouth daily.       Allergies Allergies  Allergen Reactions  . Sulfonamide Derivatives Other (See Comments)    REACTION: unspecified Childhood  . Ciprofloxin Hcl [Ciprofloxacin] Rash    itching  . Penicillins Rash    REACTION: rash    Outstanding Labs/Studies   N/a  Duration of Discharge Encounter   Greater than 30 minutes including physician time.  Signed, Reino Bellis, NP 02/27/2020, 9:24 AM

## 2020-02-27 ENCOUNTER — Other Ambulatory Visit (HOSPITAL_COMMUNITY): Payer: Medicare PPO

## 2020-02-27 DIAGNOSIS — Z794 Long term (current) use of insulin: Secondary | ICD-10-CM | POA: Diagnosis not present

## 2020-02-27 DIAGNOSIS — E7800 Pure hypercholesterolemia, unspecified: Secondary | ICD-10-CM | POA: Diagnosis not present

## 2020-02-27 DIAGNOSIS — E119 Type 2 diabetes mellitus without complications: Secondary | ICD-10-CM | POA: Diagnosis not present

## 2020-02-27 DIAGNOSIS — E785 Hyperlipidemia, unspecified: Secondary | ICD-10-CM | POA: Diagnosis not present

## 2020-02-27 DIAGNOSIS — I2584 Coronary atherosclerosis due to calcified coronary lesion: Secondary | ICD-10-CM | POA: Diagnosis not present

## 2020-02-27 DIAGNOSIS — Z79899 Other long term (current) drug therapy: Secondary | ICD-10-CM | POA: Diagnosis not present

## 2020-02-27 DIAGNOSIS — Z7982 Long term (current) use of aspirin: Secondary | ICD-10-CM | POA: Diagnosis not present

## 2020-02-27 DIAGNOSIS — I10 Essential (primary) hypertension: Secondary | ICD-10-CM | POA: Diagnosis not present

## 2020-02-27 DIAGNOSIS — I25119 Atherosclerotic heart disease of native coronary artery with unspecified angina pectoris: Secondary | ICD-10-CM | POA: Diagnosis not present

## 2020-02-27 LAB — CBC
HCT: 43.8 % (ref 36.0–46.0)
Hemoglobin: 14.6 g/dL (ref 12.0–15.0)
MCH: 30.7 pg (ref 26.0–34.0)
MCHC: 33.3 g/dL (ref 30.0–36.0)
MCV: 92 fL (ref 80.0–100.0)
Platelets: 208 10*3/uL (ref 150–400)
RBC: 4.76 MIL/uL (ref 3.87–5.11)
RDW: 13 % (ref 11.5–15.5)
WBC: 4.8 10*3/uL (ref 4.0–10.5)
nRBC: 0 % (ref 0.0–0.2)

## 2020-02-27 LAB — BASIC METABOLIC PANEL
Anion gap: 10 (ref 5–15)
BUN: 13 mg/dL (ref 8–23)
CO2: 20 mmol/L — ABNORMAL LOW (ref 22–32)
Calcium: 10.5 mg/dL — ABNORMAL HIGH (ref 8.9–10.3)
Chloride: 109 mmol/L (ref 98–111)
Creatinine, Ser: 0.52 mg/dL (ref 0.44–1.00)
GFR calc Af Amer: >60 mL/min (ref >60)
GFR calc non Af Amer: >60 mL/min (ref >60)
Glucose, Bld: 143 mg/dL — ABNORMAL HIGH (ref 70–99)
Potassium: 4.7 mmol/L (ref 3.5–5.1)
Sodium: 139 mmol/L (ref 135–145)

## 2020-02-27 LAB — GLUCOSE, CAPILLARY: Glucose-Capillary: 136 mg/dL — ABNORMAL HIGH (ref 70–99)

## 2020-02-27 MED ORDER — CLOPIDOGREL BISULFATE 75 MG PO TABS
75.0000 mg | ORAL_TABLET | Freq: Every day | ORAL | Status: DC
  Administered 2020-02-27: 75 mg via ORAL
  Filled 2020-02-27: qty 1

## 2020-02-27 MED ORDER — LISINOPRIL 20 MG PO TABS
20.0000 mg | ORAL_TABLET | Freq: Every day | ORAL | Status: DC
  Administered 2020-02-27: 20 mg via ORAL
  Filled 2020-02-27: qty 1

## 2020-02-27 MED ORDER — ASPIRIN EC 81 MG PO TBEC
81.0000 mg | DELAYED_RELEASE_TABLET | Freq: Every day | ORAL | Status: DC
  Administered 2020-02-27: 81 mg via ORAL
  Filled 2020-02-27: qty 1

## 2020-02-27 MED ORDER — HYDROCHLOROTHIAZIDE 25 MG PO TABS
25.0000 mg | ORAL_TABLET | Freq: Every day | ORAL | Status: DC
  Administered 2020-02-27: 25 mg via ORAL
  Filled 2020-02-27: qty 1

## 2020-02-27 NOTE — Progress Notes (Signed)
CARDIAC REHAB PHASE I   PRE:  Rate/Rhythm: 71 SR    BP: sitting 118/64    SaO2:   MODE:  Ambulation: 700 ft   POST:  Rate/Rhythm: 86 SR    BP: sitting 145/73     SaO2:   Tolerated well, no c/o except being hot toward end of walk. Discussed stent, restrictions, Plavix, diet, exercise, NTG and CRPII. Pt very receptive. Will refer to [place], ACSM 02/27/2020 9:53 AM

## 2020-02-27 NOTE — Progress Notes (Signed)
   There is ecchymosis at the right femoral access site.  A hematoma is not palpated.  Pedal pulses are 2+ and symmetric bilaterally.  She is ambulated without chest discomfort.  Labs are stable.  Kidney function and hemoglobin remain normal.    Ambulate with cardiac rehab.  Patient is ready for discharge with follow-up with Dr. Radford Pax and her primary physician Dr. Elveria Rising. Gloria [name].

## 2020-03-06 ENCOUNTER — Encounter: Payer: Medicare PPO | Attending: Cardiology

## 2020-03-06 ENCOUNTER — Other Ambulatory Visit: Payer: Self-pay

## 2020-03-06 DIAGNOSIS — Z955 Presence of coronary angioplasty implant and graft: Secondary | ICD-10-CM

## 2020-03-06 NOTE — Progress Notes (Signed)
Virtual Orientation performed. Patient informed when to come in for RD and EP orientation. Diagnosis can be found in [facility] 02/26/2020.

## 2020-03-07 DIAGNOSIS — N952 Postmenopausal atrophic vaginitis: Secondary | ICD-10-CM | POA: Diagnosis not present

## 2020-03-07 DIAGNOSIS — Z4689 Encounter for fitting and adjustment of other specified devices: Secondary | ICD-10-CM | POA: Diagnosis not present

## 2020-03-07 DIAGNOSIS — N8111 Cystocele, midline: Secondary | ICD-10-CM | POA: Diagnosis not present

## 2020-03-07 DIAGNOSIS — N993 Prolapse of vaginal vault after hysterectomy: Secondary | ICD-10-CM | POA: Diagnosis not present

## 2020-03-10 ENCOUNTER — Other Ambulatory Visit: Payer: Self-pay

## 2020-03-10 ENCOUNTER — Encounter: Payer: Medicare PPO | Attending: Cardiology | Admitting: *Deleted

## 2020-03-10 VITALS — Ht 64.6 in | Wt 151.1 lb

## 2020-03-10 DIAGNOSIS — Z955 Presence of coronary angioplasty implant and graft: Secondary | ICD-10-CM | POA: Diagnosis not present

## 2020-03-10 NOTE — Patient Instructions (Addendum)
Patient Instructions  Patient Details  Name: Gloria [name] MRN: [id number] Date of Birth: 1946-02-26 Referring Provider:  Sueanne Margarita, MD  Below are your personal goals for exercise, nutrition, and risk factors. Our goal is to help you stay on track towards obtaining and maintaining these goals. We will be discussing your progress on these goals with you throughout the program.  Initial Exercise Prescription: Initial Exercise Prescription - 03/10/20 1300      Date of Initial Exercise RX and Referring Provider   Date  03/10/20    Referring Provider  Fransico Him MD      Treadmill   MPH  2    Grade  0.5    Minutes  15    METs  2.81      NuStep   Level  2    SPM  80    Minutes  15    METs  2.5      REL-XR   Level  1    Speed  50    Minutes  15    METs  2.5      Biostep-RELP   Level  2    SPM  80    Minutes  15    METs  2      Prescription Details   Frequency (times per week)  3    Duration  Progress to 30 minutes of continuous aerobic without signs/symptoms of physical distress      Intensity   THRR 40-80% of Max Heartrate  102-131    Ratings of Perceived Exertion  11-13    Perceived Dyspnea  0-4      Progression   Progression  Continue to progress workloads to maintain intensity without signs/symptoms of physical distress.      Resistance Training   Training Prescription  Yes    Weight  4 lb    Reps  10-15       Exercise Goals: Frequency: Be able to perform aerobic exercise two to three times per week in program working toward 2-5 days per week of home exercise.  Intensity: Work with a perceived exertion of 11 (fairly light) - 15 (hard) while following your exercise prescription.  We will make changes to your prescription with you as you progress through the program.   Duration: Be able to do 30 to 45 minutes of continuous aerobic exercise in addition to a 5 minute warm-up and a 5 minute cool-down routine.   Nutrition Goals: Your personal  nutrition goals will be established when you do your nutrition analysis with the dietician.  The following are general nutrition guidelines to follow: Cholesterol < 200mg /day Sodium < 1500mg /day Fiber: Women over 50 yrs - 21 grams per day  Personal Goals: Personal Goals and Risk Factors at Admission - 03/10/20 1328      Core Components/Risk Factors/Patient Goals on Admission    Weight Management  Yes;Weight Maintenance    Intervention  Weight Management: Develop a combined nutrition and exercise program designed to reach desired caloric intake, while maintaining appropriate intake of nutrient and fiber, sodium and fats, and appropriate energy expenditure required for the weight goal.;Weight Management: Provide education and appropriate resources to help participant work on and attain dietary goals.    Admit Weight  151 lb 1.6 oz (68.5 kg)    Goal Weight: Short Term  145 lb (65.8 kg)    Goal Weight: Long Term  140 lb (63.5 kg)    Expected Outcomes  Short Term:  Continue to assess and modify interventions until short term weight is achieved;Weight Loss: Understanding of general recommendations for a balanced deficit meal plan, which promotes 1-2 lb weight loss per week and includes a negative energy balance of [id number] kcal/d;Understanding recommendations for meals to include 15-35% energy as protein, 25-35% energy from fat, 35-60% energy from carbohydrates, less than 200mg  of dietary cholesterol, 20-35 gm of total fiber daily;Understanding of distribution of calorie intake throughout the day with the consumption of 4-5 meals/snacks;Long Term: Adherence to nutrition and physical activity/exercise program aimed toward attainment of established weight goal    Diabetes  Yes    Intervention  Provide education about signs/symptoms and action to take for hypo/hyperglycemia.;Provide education about proper nutrition, including hydration, and aerobic/resistive exercise prescription along with prescribed  medications to achieve blood glucose in normal ranges: Fasting glucose 65-99 mg/dL    Expected Outcomes  Short Term: Participant verbalizes understanding of the signs/symptoms and immediate care of hyper/hypoglycemia, proper foot care and importance of medication, aerobic/resistive exercise and nutrition plan for blood glucose control.;Long Term: Attainment of HbA1C < 7%.    Lipids  Yes    Intervention  Provide education and support for participant on nutrition & aerobic/resistive exercise along with prescribed medications to achieve LDL 70mg , HDL >40mg .    Expected Outcomes  Short Term: Participant states understanding of desired cholesterol values and is compliant with medications prescribed. Participant is following exercise prescription and nutrition guidelines.;Long Term: Cholesterol controlled with medications as prescribed, with individualized exercise RX and with personalized nutrition plan. Value goals: LDL < 70mg , HDL > 40 mg.       Tobacco Use Initial Evaluation: Social History   Tobacco Use  Smoking Status Never Smoker  Smokeless Tobacco Never Used    Exercise Goals and Review: Exercise Goals    Row Name 03/10/20 1326             Exercise Goals   Increase Physical Activity  Yes       Intervention  Provide advice, education, support and counseling about physical activity/exercise needs.;Develop an individualized exercise prescription for aerobic and resistive training based on initial evaluation findings, risk stratification, comorbidities and participant's personal goals.       Expected Outcomes  Short Term: Attend rehab on a regular basis to increase amount of physical activity.;Long Term: Add in home exercise to make exercise part of routine and to increase amount of physical activity.;Long Term: Exercising regularly at least 3-5 days a week.       Increase Strength and Stamina  Yes       Intervention  Provide advice, education, support and counseling about physical  activity/exercise needs.;Develop an individualized exercise prescription for aerobic and resistive training based on initial evaluation findings, risk stratification, comorbidities and participant's personal goals.       Expected Outcomes  Short Term: Increase workloads from initial exercise prescription for resistance, speed, and METs.;Long Term: Improve cardiorespiratory fitness, muscular endurance and strength as measured by increased METs and functional capacity (6MWT);Short Term: Perform resistance training exercises routinely during rehab and add in resistance training at home       Able to understand and use rate of perceived exertion (RPE) scale  Yes       Intervention  Provide education and explanation on how to use RPE scale       Expected Outcomes  Short Term: Able to use RPE daily in rehab to express subjective intensity level;Long Term:  Able to use RPE to guide intensity level  when exercising independently       Able to understand and use Dyspnea scale  Yes       Intervention  Provide education and explanation on how to use Dyspnea scale       Expected Outcomes  Short Term: Able to use Dyspnea scale daily in rehab to express subjective sense of shortness of breath during exertion;Long Term: Able to use Dyspnea scale to guide intensity level when exercising independently       Knowledge and understanding of Target Heart Rate Range (THRR)  Yes       Intervention  Provide education and explanation of THRR including how the numbers were predicted and where they are located for reference       Expected Outcomes  Short Term: Able to state/look up THRR;Short Term: Able to use daily as guideline for intensity in rehab;Long Term: Able to use THRR to govern intensity when exercising independently       Able to check pulse independently  Yes       Intervention  Provide education and demonstration on how to check pulse in carotid and radial arteries.;Review the importance of being able to check your  own pulse for safety during independent exercise       Expected Outcomes  Short Term: Able to explain why pulse checking is important during independent exercise;Long Term: Able to check pulse independently and accurately       Understanding of Exercise Prescription  Yes       Intervention  Provide education, explanation, and written materials on patient's individual exercise prescription       Expected Outcomes  Short Term: Able to explain program exercise prescription;Long Term: Able to explain home exercise prescription to exercise independently          Copy of goals given to participant.

## 2020-03-10 NOTE — Progress Notes (Signed)
Cardiac Individual Treatment Plan  Patient Details  Name: Gloria [name] MRN: [id number] Date of Birth: 05-04-1946 Referring Provider:     Cardiac Rehab from 03/10/2020 in [facility] Cardiac and Pulmonary Rehab  Referring Provider  Fransico Him MD      Initial Encounter Date:    Cardiac Rehab from 03/10/2020 in [facility] Cardiac and Pulmonary Rehab  Date  03/10/20      Visit Diagnosis: Status post coronary artery stent placement  Patient's Home Medications on Admission:  Current Outpatient Medications:  .  ACCU-CHEK AVIVA PLUS test strip, CHECK BLOOD SUGAR FOUR TIMES DAILY AS NEEDED, Disp: 100 each, Rfl: 3 .  ACCU-CHEK FASTCLIX LANCETS MISC, Use to check blood sugar 4 times daily as needed.  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  aspirin EC 81 MG tablet, Take 1 tablet (81 mg total) by mouth daily. (Patient taking differently: Take 81 mg by mouth at bedtime. ), Disp: 90 tablet, Rfl: 3 .  atorvastatin (LIPITOR) 10 MG tablet, TAKE 1 TABLET BY MOUTH DAILY (Patient taking differently: Take 10 mg by mouth at bedtime. ), Disp: 90 tablet, Rfl: 2 .  Blood Glucose Monitoring Suppl (ACCU-CHEK AVIVA PLUS) W/DEVICE KIT, Use to check blood sugar 4 times daily as needed.  Diagnosis:  E11.9  Insulin Dependent, Disp: 1 kit, Rfl: 0 .  Cholecalciferol (VITAMIN D) 50 MCG (2000 UT) CAPS, Take 2,000 Units by mouth daily. , Disp: , Rfl:  .  clopidogrel (PLAVIX) 75 MG tablet, Take 1 tablet (75 mg total) by mouth daily., Disp: 30 tablet, Rfl: 11 .  hydrochlorothiazide (HYDRODIURIL) 25 MG tablet, TAKE ONE TABLET EVERY DAY (Patient taking differently: Take 25 mg by mouth daily. ), Disp: 90 tablet, Rfl: 2 .  insulin detemir (LEVEMIR FLEXTOUCH) 100 UNIT/ML FlexPen, INJECT 66 UNITS SUBCUTANEOUSLY AT BEDTIME (Patient taking differently: Inject 66 Units into the skin at bedtime. ), Disp: 90 mL, Rfl: 0 .  Insulin Pen Needle (B-D ULTRAFINE III SHORT PEN) 31G X 8 MM MISC, USE AS DIRECTED  ** DUE FOR OFFICE  VISIT/PHYSICAL, Disp: 100 each, Rfl: 0 .  metFORMIN (GLUCOPHAGE) 1000 MG tablet, TAKE 1 TABLET BY MOUTH TWICE DAILY WITH MEALS (Patient taking differently: Take 1,000 mg by mouth in the morning and at bedtime. ), Disp: 180 tablet, Rfl: 3 .  Probiotic Product (ALIGN) 4 MG CAPS, Take 4 mg by mouth daily. , Disp: , Rfl:  .  Psyllium (METAMUCIL PO), Take 1 tablet by mouth daily as needed (Constipation). , Disp: , Rfl:  .  quinapril (ACCUPRIL) 20 MG tablet, TAKE ONE TABLET BY MOUTH TWICE DAILY (Patient taking differently: Take 20 mg by mouth 2 (two) times daily. ), Disp: 180 tablet, Rfl: 2 .  vitamin C (ASCORBIC ACID) 500 MG tablet, Take 500 mg by mouth daily., Disp: , Rfl:   Past Medical History: Past Medical History:  Diagnosis Date  . ABDOMINAL PAIN OTHER SPECIFIED SITE 08/27/2010   Qualifier: Diagnosis of  By: Damita Dunnings MD, Phillip Heal    . Advance directive discussed with patient 01/03/2012   12/2011-AD d/w pt.  She has talked to husband but doesn't have living will yet.  Full code in discussion with patient today.  Would not want prolonged interventions if she were profoundly and/or permanently impaired, ie severe dementia or a condition with no hope of improvement.   01/13/15-Advance directive d/w pt- husband designated if patient were incapacitated.    . Allergic rhinitis   . Cardiac murmur 2011  with normal echo  . CARDIAC MURMUR 05/26/2010   Qualifier: Diagnosis of  By: Damita Dunnings MD, Phillip Heal    . Diabetes mellitus    Type II  . Diabetes mellitus without complication ([place]) 1/54/0086   Qualifier: Diagnosis of  By: Council Mechanic MD, Hilaria Ota   . Disorder of bone and cartilage 12/20/2007   2016 DXA: -1.4.  Consider repeat in 2021.     Marland Kitchen Essential hypertension, benign 03/30/2011  . Exertional shortness of breath 01/02/2020  . Female cystocele 08/10/2012  . Health care maintenance 02/09/2018  . Hemorrhoids   . Hyperlipidemia   . Hypertension   . Left shoulder pain 07/20/2016  . Medicare annual wellness visit,  subsequent 01/03/2012  . MENOPAUSE, SURGICAL 12/16/2009   Qualifier: Diagnosis of  By: Lurlean Nanny LPN, Regina    . Pure hypercholesterolemia 03/30/2011  . RENAL CALCULUS 05/24/2007   Qualifier: Diagnosis of  By: Council Mechanic MD, Hilaria Ota   . RHINITIS 12/24/2010   Qualifier: Diagnosis of  By: Damita Dunnings MD, Phillip Heal    . Snoring 01/02/2020  . Ureteral stone with hydronephrosis 08/02/2012    Tobacco Use: Social History   Tobacco Use  Smoking Status Never Smoker  Smokeless Tobacco Never Used    Labs: Recent Review Flowsheet Data    Labs for ITP Cardiac and Pulmonary Rehab Latest Ref Rng & Units 08/29/2017 02/02/2018 06/29/2019 10/01/2019 01/01/2020   Cholestrol 0 - 200 mg/dL - 125 132 - 119   LDLCALC 0 - 99 mg/dL - 79 85 - 74   HDL >39.00 mg/dL - 34.00(L) 33.20(L) - 32.70(L)   Trlycerides 0.0 - 149.0 mg/dL - 58.0 70.0 - 61.0   Hemoglobin A1c 4.0 - 5.6 % 7.7(H) 7.9(H) 8.1(H) 7.5(A) 7.4(A)       Exercise Target Goals: Exercise Program Goal: Individual exercise prescription set using results from initial 6 min walk test and THRR while considering  patient's activity barriers and safety.   Exercise Prescription Goal: Initial exercise prescription builds to 30-45 minutes a day of aerobic activity, 2-3 days per week.  Home exercise guidelines will be given to patient during program as part of exercise prescription that the participant will acknowledge.   Education: Aerobic Exercise & Resistance Training: - Gives group verbal and written instruction on the various components of exercise. Focuses on aerobic and resistive training programs and the benefits of this training and how to safely progress through these programs..   Education: Exercise & Equipment Safety: - Individual verbal instruction and demonstration of equipment use and safety with use of the equipment.   Cardiac Rehab from 03/10/2020 in [facility] Cardiac and Pulmonary Rehab  Date  03/06/20  Educator  [facility]  Instruction Review Code  1- Verbalizes  Understanding      Education: Exercise Physiology & General Exercise Guidelines: - Group verbal and written instruction with models to review the exercise physiology of the cardiovascular system and associated critical values. Provides general exercise guidelines with specific guidelines to those with heart or lung disease.    Education: Flexibility, Balance, Mind/Body Relaxation: Provides group verbal/written instruction on the benefits of flexibility and balance training, including mind/body exercise modes such as yoga, pilates and tai chi.  Demonstration and skill practice provided.   Activity Barriers & Risk Stratification: Activity Barriers & Cardiac Risk Stratification - 03/10/20 1317      Activity Barriers & Cardiac Risk Stratification   Activity Barriers  Arthritis;Joint Problems;Deconditioning;Shortness of Breath;Chest Pain/Angina;Balance Concerns   occasional shoulder and knee pain, limited ROM in shoulders especially overhead  Cardiac Risk Stratification  Moderate       6 Minute Walk: 6 Minute Walk    Row Name 03/10/20 1316         6 Minute Walk   Phase  Initial     Distance  1266 feet     Walk Time  6 minutes     # of Rest Breaks  0     MPH  2.4     METS  2.68     RPE  9     VO2 Peak  9.39     Symptoms  No     Resting HR  72 bpm     Resting BP  124/62     Resting Oxygen Saturation   96 %     Exercise Oxygen Saturation  during 6 min walk  96 %     Max Ex. HR  102 bpm     Max Ex. BP  128/64     2 Minute Post BP  116/64        Oxygen Initial Assessment:   Oxygen Re-Evaluation:   Oxygen Discharge (Final Oxygen Re-Evaluation):   Initial Exercise Prescription: Initial Exercise Prescription - 03/10/20 1300      Date of Initial Exercise RX and Referring Provider   Date  03/10/20    Referring Provider  Fransico Him MD      Treadmill   MPH  2    Grade  0.5    Minutes  15    METs  2.81      NuStep   Level  2    SPM  80    Minutes  15     METs  2.5      REL-XR   Level  1    Speed  50    Minutes  15    METs  2.5      Biostep-RELP   Level  2    SPM  80    Minutes  15    METs  2      Prescription Details   Frequency (times per week)  3    Duration  Progress to 30 minutes of continuous aerobic without signs/symptoms of physical distress      Intensity   THRR 40-80% of Max Heartrate  102-131    Ratings of Perceived Exertion  11-13    Perceived Dyspnea  0-4      Progression   Progression  Continue to progress workloads to maintain intensity without signs/symptoms of physical distress.      Resistance Training   Training Prescription  Yes    Weight  4 lb    Reps  10-15       Perform Capillary Blood Glucose checks as needed.  Exercise Prescription Changes: Exercise Prescription Changes    Row Name 03/10/20 1300             Response to Exercise   Blood Pressure (Admit)  124/62       Blood Pressure (Exercise)  128/64       Blood Pressure (Exit)  116/64       Heart Rate (Admit)  72 bpm       Heart Rate (Exercise)  102 bpm       Heart Rate (Exit)  73 bpm       Oxygen Saturation (Admit)  96 %       Oxygen Saturation (Exercise)  96 %       Rating  of Perceived Exertion (Exercise)  9       Symptoms  none       Comments  walk test results          Exercise Comments:   Exercise Goals and Review: Exercise Goals    Row Name 03/10/20 1326             Exercise Goals   Increase Physical Activity  Yes       Intervention  Provide advice, education, support and counseling about physical activity/exercise needs.;Develop an individualized exercise prescription for aerobic and resistive training based on initial evaluation findings, risk stratification, comorbidities and participant's personal goals.       Expected Outcomes  Short Term: Attend rehab on a regular basis to increase amount of physical activity.;Long Term: Add in home exercise to make exercise part of routine and to increase amount of physical  activity.;Long Term: Exercising regularly at least 3-5 days a week.       Increase Strength and Stamina  Yes       Intervention  Provide advice, education, support and counseling about physical activity/exercise needs.;Develop an individualized exercise prescription for aerobic and resistive training based on initial evaluation findings, risk stratification, comorbidities and participant's personal goals.       Expected Outcomes  Short Term: Increase workloads from initial exercise prescription for resistance, speed, and METs.;Long Term: Improve cardiorespiratory fitness, muscular endurance and strength as measured by increased METs and functional capacity (6MWT);Short Term: Perform resistance training exercises routinely during rehab and add in resistance training at home       Able to understand and use rate of perceived exertion (RPE) scale  Yes       Intervention  Provide education and explanation on how to use RPE scale       Expected Outcomes  Short Term: Able to use RPE daily in rehab to express subjective intensity level;Long Term:  Able to use RPE to guide intensity level when exercising independently       Able to understand and use Dyspnea scale  Yes       Intervention  Provide education and explanation on how to use Dyspnea scale       Expected Outcomes  Short Term: Able to use Dyspnea scale daily in rehab to express subjective sense of shortness of breath during exertion;Long Term: Able to use Dyspnea scale to guide intensity level when exercising independently       Knowledge and understanding of Target Heart Rate Range (THRR)  Yes       Intervention  Provide education and explanation of THRR including how the numbers were predicted and where they are located for reference       Expected Outcomes  Short Term: Able to state/look up THRR;Short Term: Able to use daily as guideline for intensity in rehab;Long Term: Able to use THRR to govern intensity when exercising independently       Able  to check pulse independently  Yes       Intervention  Provide education and demonstration on how to check pulse in carotid and radial arteries.;Review the importance of being able to check your own pulse for safety during independent exercise       Expected Outcomes  Short Term: Able to explain why pulse checking is important during independent exercise;Long Term: Able to check pulse independently and accurately       Understanding of Exercise Prescription  Yes       Intervention  Provide  education, explanation, and written materials on patient's individual exercise prescription       Expected Outcomes  Short Term: Able to explain program exercise prescription;Long Term: Able to explain home exercise prescription to exercise independently          Exercise Goals Re-Evaluation :   Discharge Exercise Prescription (Final Exercise Prescription Changes): Exercise Prescription Changes - 03/10/20 1300      Response to Exercise   Blood Pressure (Admit)  124/62    Blood Pressure (Exercise)  128/64    Blood Pressure (Exit)  116/64    Heart Rate (Admit)  72 bpm    Heart Rate (Exercise)  102 bpm    Heart Rate (Exit)  73 bpm    Oxygen Saturation (Admit)  96 %    Oxygen Saturation (Exercise)  96 %    Rating of Perceived Exertion (Exercise)  9    Symptoms  none    Comments  walk test results       Nutrition:  Target Goals: Understanding of nutrition guidelines, daily intake of sodium <1534m, cholesterol <2088m calories 30% from fat and 7% or less from saturated fats, daily to have 5 or more servings of fruits and vegetables.  Education: Controlling Sodium/Reading Food Labels -Group verbal and written material supporting the discussion of sodium use in heart healthy nutrition. Review and explanation with models, verbal and written materials for utilization of the food label.   Education: General Nutrition Guidelines/Fats and Fiber: -Group instruction provided by verbal, written material,  models and posters to present the general guidelines for heart healthy nutrition. Gives an explanation and review of dietary fats and fiber.   Biometrics: Pre Biometrics - 03/10/20 1326      Pre Biometrics   Height  5' 4.6" (1.641 m)    Weight  151 lb 1.6 oz (68.5 kg)    BMI (Calculated)  25.45    Single Leg Stand  7.78 seconds        Nutrition Therapy Plan and Nutrition Goals:   Nutrition Assessments: Nutrition Assessments - 03/10/20 1327      MEDFICTS Scores   Pre Score  20       MEDIFICTS Score Key:          ?70 Need to make dietary changes          40-70 Heart Healthy Diet         ? 40 Therapeutic Level Cholesterol Diet  Nutrition Goals Re-Evaluation:   Nutrition Goals Discharge (Final Nutrition Goals Re-Evaluation):   Psychosocial: Target Goals: Acknowledge presence or absence of significant depression and/or stress, maximize coping skills, provide positive support system. Participant is able to verbalize types and ability to use techniques and skills needed for reducing stress and depression.   Education: Depression - Provides group verbal and written instruction on the correlation between heart/lung disease and depressed mood, treatment options, and the stigmas associated with seeking treatment.   Education: Sleep Hygiene -Provides group verbal and written instruction about how sleep can affect your health.  Define sleep hygiene, discuss sleep cycles and impact of sleep habits. Review good sleep hygiene tips.     Education: Stress and Anxiety: - Provides group verbal and written instruction about the health risks of elevated stress and causes of high stress.  Discuss the correlation between heart/lung disease and anxiety and treatment options. Review healthy ways to manage with stress and anxiety.    Initial Review & Psychosocial Screening: Initial Psych Review & Screening - 03/06/20 1034  Initial Review   Current issues with  Current Sleep  Concerns      Family Dynamics   Good Support System?  Yes    Comments  She states that she has moderate sleep apnea. She has to go back to the dentist to get a mouthpiece to help with her Apnea. She can look to her husband, daughter and son for support.      Barriers   Psychosocial barriers to participate in program  The patient should benefit from training in stress management and relaxation.      Screening Interventions   Interventions  Encouraged to exercise;Provide feedback about the scores to participant;To provide support and resources with identified psychosocial needs    Expected Outcomes  Short Term goal: Utilizing psychosocial counselor, staff and physician to assist with identification of specific Stressors or current issues interfering with healing process. Setting desired goal for each stressor or current issue identified.;Long Term Goal: Stressors or current issues are controlled or eliminated.;Short Term goal: Identification and review with participant of any Quality of Life or Depression concerns found by scoring the questionnaire.;Long Term goal: The participant improves quality of Life and PHQ9 Scores as seen by post scores and/or verbalization of changes       Quality of Life Scores:  Quality of Life - 03/10/20 1327      Quality of Life   Select  Quality of Life      Quality of Life Scores   Health/Function Pre  21.43 %    Socioeconomic Pre  24.07 %    Psych/Spiritual Pre  23.14 %    Family Pre  28.8 %    GLOBAL Pre  23.41 %      Scores of 19 and below usually indicate a poorer quality of life in these areas.  A difference of  2-3 points is a clinically meaningful difference.  A difference of 2-3 points in the total score of the Quality of Life Index has been associated with significant improvement in overall quality of life, self-image, physical symptoms, and general health in studies assessing change in quality of life.  PHQ-9: Recent Review Flowsheet Data     Depression screen [facility] 2/9 03/10/2020 06/29/2019 02/02/2018 01/25/2017 01/15/2016   Decreased Interest 0 0 0 0 0   Down, Depressed, Hopeless 0 0 0 0 0   PHQ - 2 Score 0 0 0 0 0   Altered sleeping 2 0 0 - -   Tired, decreased energy 2 0 0 - -   Change in appetite 0 0 0 - -   Feeling bad or failure about yourself  0 0 0 - -   Trouble concentrating 0 0 0 - -   Moving slowly or fidgety/restless 0 0 0 - -   Suicidal thoughts 0 0 0 - -   PHQ-9 Score 4 0 0 - -   Difficult doing work/chores Somewhat difficult - Not difficult at all - -     Interpretation of Total Score  Total Score Depression Severity:  1-4 = Minimal depression, 5-9 = Mild depression, 10-14 = Moderate depression, 15-19 = Moderately severe depression, 20-27 = Severe depression   Psychosocial Evaluation and Intervention: Psychosocial Evaluation - 03/06/20 1038      Psychosocial Evaluation & Interventions   Interventions  Encouraged to exercise with the program and follow exercise prescription    Comments  She states that she has moderate sleep apnea. She has to go back to the dentist to get a  mouthpiece to help with her Apnea. She can look to her husband, daughter and son for support.    Expected Outcomes  Short: Attend [facility] stress management education to decrease stress. Long: Maintain exercise Post Heart[facility] to keep stress at a minimum.    Continue Psychosocial Services   Follow up required by staff       Psychosocial Re-Evaluation:   Psychosocial Discharge (Final Psychosocial Re-Evaluation):   Vocational Rehabilitation: Provide vocational rehab assistance to qualifying candidates.   Vocational Rehab Evaluation & Intervention:   Education: Education Goals: Education classes will be provided on a variety of topics geared toward better understanding of heart health and risk factor modification. Participant will state understanding/return demonstration of topics presented as noted by education test scores.  Learning  Barriers/Preferences: Learning Barriers/Preferences - 03/06/20 1036      Learning Barriers/Preferences   Learning Barriers  Sight    Learning Preferences  None       General Cardiac Education Topics:  AED/CPR: - Group verbal and written instruction with the use of models to demonstrate the basic use of the AED with the basic ABC's of resuscitation.   Anatomy & Physiology of the Heart: - Group verbal and written instruction and models provide basic cardiac anatomy and physiology, with the coronary electrical and arterial systems. Review of Valvular disease and Heart Failure   Cardiac Procedures: - Group verbal and written instruction to review commonly prescribed medications for heart disease. Reviews the medication, class of the drug, and side effects. Includes the steps to properly store meds and maintain the prescription regimen. (beta blockers and nitrates)   Cardiac Medications I: - Group verbal and written instruction to review commonly prescribed medications for heart disease. Reviews the medication, class of the drug, and side effects. Includes the steps to properly store meds and maintain the prescription regimen.   Cardiac Medications II: -Group verbal and written instruction to review commonly prescribed medications for heart disease. Reviews the medication, class of the drug, and side effects. (all other drug classes)    Go Sex-Intimacy & Heart Disease, Get SMART - Goal Setting: - Group verbal and written instruction through game format to discuss heart disease and the return to sexual intimacy. Provides group verbal and written material to discuss and apply goal setting through the application of the S.M.A.R.T. Method.   Other Matters of the Heart: - Provides group verbal, written materials and models to describe Stable Angina and Peripheral Artery. Includes description of the disease process and treatment options available to the cardiac patient.   Infection  Prevention: - Provides verbal and written material to individual with discussion of infection control including proper hand washing and proper equipment cleaning during exercise session.   Cardiac Rehab from 03/10/2020 in [facility] Cardiac and Pulmonary Rehab  Date  03/06/20  Educator  [facility]  Instruction Review Code  1- Verbalizes Understanding      Falls Prevention: - Provides verbal and written material to individual with discussion of falls prevention and safety.   Cardiac Rehab from 03/10/2020 in [facility] Cardiac and Pulmonary Rehab  Date  03/06/20  Educator  [facility]  Instruction Review Code  1- Verbalizes Understanding      Other: -Provides group and verbal instruction on various topics (see comments)   Knowledge Questionnaire Score: Knowledge Questionnaire Score - 03/10/20 1327      Knowledge Questionnaire Score   Pre Score  24/26 Education Focus: Exercise and Nutriton       Core Components/Risk Factors/Patient Goals at Admission:  Personal Goals and Risk Factors at Admission - 03/10/20 1328      Core Components/Risk Factors/Patient Goals on Admission    Weight Management  Yes;Weight Maintenance    Intervention  Weight Management: Develop a combined nutrition and exercise program designed to reach desired caloric intake, while maintaining appropriate intake of nutrient and fiber, sodium and fats, and appropriate energy expenditure required for the weight goal.;Weight Management: Provide education and appropriate resources to help participant work on and attain dietary goals.    Admit Weight  151 lb 1.6 oz (68.5 kg)    Goal Weight: Short Term  145 lb (65.8 kg)    Goal Weight: Long Term  140 lb (63.5 kg)    Expected Outcomes  Short Term: Continue to assess and modify interventions until short term weight is achieved;Weight Loss: Understanding of general recommendations for a balanced deficit meal plan, which promotes 1-2 lb weight loss per week and includes a negative energy balance of [phone]  kcal/d;Understanding recommendations for meals to include 15-35% energy as protein, 25-35% energy from fat, 35-60% energy from carbohydrates, less than 254m of dietary cholesterol, 20-35 gm of total fiber daily;Understanding of distribution of calorie intake throughout the day with the consumption of 4-5 meals/snacks;Long Term: Adherence to nutrition and physical activity/exercise program aimed toward attainment of established weight goal    Diabetes  Yes    Intervention  Provide education about signs/symptoms and action to take for hypo/hyperglycemia.;Provide education about proper nutrition, including hydration, and aerobic/resistive exercise prescription along with prescribed medications to achieve blood glucose in normal ranges: Fasting glucose 65-99 mg/dL    Expected Outcomes  Short Term: Participant verbalizes understanding of the signs/symptoms and immediate care of hyper/hypoglycemia, proper foot care and importance of medication, aerobic/resistive exercise and nutrition plan for blood glucose control.;Long Term: Attainment of HbA1C < 7%.    Lipids  Yes    Intervention  Provide education and support for participant on nutrition & aerobic/resistive exercise along with prescribed medications to achieve LDL <756m HDL >4045m   Expected Outcomes  Short Term: Participant states understanding of desired cholesterol values and is compliant with medications prescribed. Participant is following exercise prescription and nutrition guidelines.;Long Term: Cholesterol controlled with medications as prescribed, with individualized exercise RX and with personalized nutrition plan. Value goals: LDL < 75m63mDL > 40 mg.       Education:Diabetes - Individual verbal and written instruction to review signs/symptoms of diabetes, desired ranges of glucose level fasting, after meals and with exercise. Acknowledge that pre and post exercise glucose checks will be done for 3 sessions at entry of program.   [facility]  Date  03/06/20  Educator  J[facility]  [facility] Review Code  1- Verbalizes Understanding      Education: Know Your Numbers and Risk Factors: -Group verbal and written instruction about important numbers in your health.  Discussion of what are risk factors and how they play a role in the disease process.  Review of Cholesterol, Blood Pressure, Diabetes, and BMI and the role they play in your overall health.   Core Components/Risk Factors/Patient Goals Review:    Core Components/Risk Factors/Patient Goals at Discharge (Final Review):    ITP Comments: ITP Comments    Row Name 03/06/20 1040 03/10/20 1315         ITP Comments  Virtual Orientation performed. Patient informed when to come in for RD and EP orientation. Diagnosis can be found in [facility]/20[facility].  Completed 6MWT and gym orientation.  Initial ITP created and sent for review to Dr. Emily Filbert, Medical Director.         Comments: Initial ITP

## 2020-03-12 ENCOUNTER — Other Ambulatory Visit: Payer: Self-pay

## 2020-03-12 ENCOUNTER — Encounter: Payer: Self-pay | Admitting: Cardiology

## 2020-03-12 ENCOUNTER — Ambulatory Visit: Payer: Medicare PPO | Admitting: Cardiology

## 2020-03-12 VITALS — BP 118/64 | HR 72 | Ht 64.6 in | Wt 151.8 lb

## 2020-03-12 DIAGNOSIS — E119 Type 2 diabetes mellitus without complications: Secondary | ICD-10-CM | POA: Diagnosis not present

## 2020-03-12 DIAGNOSIS — I25119 Atherosclerotic heart disease of native coronary artery with unspecified angina pectoris: Secondary | ICD-10-CM

## 2020-03-12 DIAGNOSIS — E7800 Pure hypercholesterolemia, unspecified: Secondary | ICD-10-CM

## 2020-03-12 DIAGNOSIS — Z95820 Peripheral vascular angioplasty status with implants and grafts: Secondary | ICD-10-CM | POA: Diagnosis not present

## 2020-03-12 DIAGNOSIS — I2510 Atherosclerotic heart disease of native coronary artery without angina pectoris: Secondary | ICD-10-CM

## 2020-03-12 DIAGNOSIS — I10 Essential (primary) hypertension: Secondary | ICD-10-CM

## 2020-03-12 MED ORDER — FAMOTIDINE 20 MG PO TABS: 20.0000 mg | ORAL_TABLET | Freq: Every day | ORAL | 0 refills | Status: DC

## 2020-03-12 MED ORDER — NITROGLYCERIN 0.4 MG SL SUBL: 0.4000 mg | SUBLINGUAL_TABLET | SUBLINGUAL | 3 refills | Status: DC | PRN

## 2020-03-12 NOTE — Progress Notes (Signed)
Cardiology Office Note   Date:  03/12/2020   ID:  Gloria [name], DOB 03-27-1946, MRN [id number]  PCP:  Tonia Ghent, MD  Cardiologist:  Dr. Radford Pax    Chief Complaint  Patient presents with  . Hospitalization Follow-up      History of Present Illness: Gloria [name] is a 74 y.o. female who presents for post hospitalization.   hx of DM2, heart murmur with normal echo in 2011 and HTN. She recently complained to her PCP that she was having problems with DOE when walking that she first noticed last fall when walking with her daughter in the neighborhood. She said that she was fine on flat ground but if she waked up an incline she would have to stop due to SOB and chest pressure PCI proximal to [facility] 02/26/20  LAD, LCX and >50% LM Oozing at Rt femoral cath site Plan for DAPT with ASA/plavix for at least 6 months  No further issues with Rt groin cath site.  Healing small scab.  She begin cardiac rehab next week. She has done initial visit.  No angina like prior to PCI.  She has some heaviness in her chest that is constant.  She had it post procedure but worse.  Dr. Tamala Julian was aware and thought due to PCI with down stream debris.  Overall it keeps improving.   Does not wake from sleep and does not increase with walking.  She is walking 20 min in AM 12-15 min in evening. Has adjusted diet with new CAD.  She does not have any NTG so will prescribe for her.  She has been taking her BP twice a day and has been well controlled.  She can take if needed now, she will have it checked in cardiac rehab.    Past Medical History:  Diagnosis Date  . ABDOMINAL PAIN OTHER SPECIFIED SITE 08/27/2010   Qualifier: Diagnosis of  By: Damita Dunnings MD, Phillip Heal    . Advance directive discussed with patient 01/03/2012   12/2011-AD d/w pt.  She has talked to husband but doesn't have living will yet.  Full code in discussion with patient today.  Would not want prolonged interventions if she were profoundly and/or permanently  impaired, ie severe dementia or a condition with no hope of improvement.   01/13/15-Advance directive d/w pt- husband designated if patient were incapacitated.    . Allergic rhinitis   . Cardiac murmur 2011   with normal echo  . CARDIAC MURMUR 05/26/2010   Qualifier: Diagnosis of  By: Damita Dunnings MD, Phillip Heal    . Diabetes mellitus    Type II  . Diabetes mellitus without complication ([place]) 6/33/3545   Qualifier: Diagnosis of  By: Council Mechanic MD, Hilaria Ota   . Disorder of bone and cartilage 12/20/2007   2016 DXA: -1.4.  Consider repeat in 2021.     Marland Kitchen Essential hypertension, benign 03/30/2011  . Exertional shortness of breath 01/02/2020  . Female cystocele 08/10/2012  . Health care maintenance 02/09/2018  . Hemorrhoids   . Hyperlipidemia   . Hypertension   . Left shoulder pain 07/20/2016  . Medicare annual wellness visit, subsequent 01/03/2012  . MENOPAUSE, SURGICAL 12/16/2009   Qualifier: Diagnosis of  By: Lurlean Nanny LPN, Regina    . Pure hypercholesterolemia 03/30/2011  . RENAL CALCULUS 05/24/2007   Qualifier: Diagnosis of  By: Council Mechanic MD, Hilaria Ota   . RHINITIS 12/24/2010   Qualifier: Diagnosis of  By: Damita Dunnings MD, Phillip Heal    . Snoring 01/02/2020  .  Ureteral stone with hydronephrosis 08/02/2012    Past Surgical History:  Procedure Laterality Date  . ABDOMINAL HYSTERECTOMY    . BASAL CELL CARCINOMA EXCISION  08/23/2017   left upper arm  . CARPAL TUNNEL RELEASE     with bilateral releases  . CORONARY ATHERECTOMY N/A 02/26/2020   Procedure: CORONARY ATHERECTOMY;  Surgeon: [name], MD;  Location: [place] [facility];  Service: Cardiovascular;  Laterality: N/A;  . CYSTECTOMY     Left breast, right lumpectomy B9  . INGUINAL HERNIA REPAIR     Right (Dr. Annamaria Boots)  . LEFT HEART CATH AND CORONARY ANGIOGRAPHY N/A 02/22/2020   Procedure: LEFT HEART CATH AND CORONARY ANGIOGRAPHY;  Surgeon: [name], MD;  Location: [place] [facility];  Service: Cardiovascular;  Laterality: N/A;  . NSVD     X 2  . TVH  with vaginal prolapse  08/29/09   Transvag Tape w/Varitensor, Ant & Post Colporraphy, Uterosacral Lig susp, McCall Culdoplasty  . ueteroscopy  11/01   without laser secondary stone  . Urological procedure  10/22 - 08/31/09   [facility]     Current Outpatient Medications  Medication Sig Dispense Refill  . ACCU-CHEK AVIVA PLUS test strip CHECK BLOOD SUGAR FOUR TIMES DAILY AS NEEDED 100 each 3  . ACCU-CHEK FASTCLIX LANCETS MISC Use to check blood sugar 4 times daily as needed.  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 102 each 11  . aspirin EC 81 MG tablet Take 1 tablet (81 mg total) by mouth daily. 90 tablet 3  . atorvastatin (LIPITOR) 10 MG tablet TAKE 1 TABLET BY MOUTH DAILY 90 tablet 2  . Blood Glucose Monitoring Suppl (ACCU-CHEK AVIVA PLUS) W/DEVICE KIT Use to check blood sugar 4 times daily as needed.  Diagnosis:  E11.9  Insulin Dependent 1 kit 0  . Cholecalciferol (VITAMIN D) 50 MCG (2000 UT) CAPS Take 2,000 Units by mouth daily.     . clopidogrel (PLAVIX) 75 MG tablet Take 1 tablet (75 mg total) by mouth daily. 30 tablet 11  . hydrochlorothiazide (HYDRODIURIL) 25 MG tablet TAKE ONE TABLET EVERY DAY 90 tablet 2  . insulin detemir (LEVEMIR FLEXTOUCH) 100 UNIT/ML FlexPen INJECT 66 UNITS SUBCUTANEOUSLY AT BEDTIME 90 mL 0  . Insulin Pen Needle (B-D ULTRAFINE III SHORT PEN) 31G X 8 MM MISC USE AS DIRECTED  ** DUE FOR OFFICE VISIT/PHYSICAL 100 each 0  . metFORMIN (GLUCOPHAGE) 1000 MG tablet TAKE 1 TABLET BY MOUTH TWICE DAILY WITH MEALS 180 tablet 3  . Probiotic Product (ALIGN) 4 MG CAPS Take 4 mg by mouth daily.     . Psyllium (METAMUCIL PO) Take 1 tablet by mouth daily as needed (Constipation).     . quinapril (ACCUPRIL) 20 MG tablet TAKE ONE TABLET BY MOUTH TWICE DAILY 180 tablet 2  . vitamin C (ASCORBIC ACID) 500 MG tablet Take 500 mg by mouth daily.     No current facility-administered medications for this visit.    Allergies:   Sulfonamide derivatives, Ciprofloxin hcl [ciprofloxacin], and  Penicillins    Social History:  The patient  reports that she has never smoked. She has never used smokeless tobacco. She reports current alcohol use of about 2.0 - 3.0 standard drinks of alcohol per week. She reports that she does not use drugs.   Family History:  The patient's family history includes Alcohol abuse in her cousin; Breast cancer in her cousin; Diabetes (age of onset: 33) in her paternal aunt; Heart disease in her mother, paternal uncle, and paternal  uncle; Hypertension in her mother; Prostate cancer in her son; Stroke in her maternal aunt and paternal aunt; Thyroid disease in an other family member.    ROS:  General:no colds or fevers, no weight changes Skin:no rashes or ulcers HEENT:no blurred vision, no congestion CV:see HPI PUL:see HPI GI:no diarrhea constipation or melena, no indigestion GU:no hematuria, no dysuria MS:no joint pain, no claudication Neuro:no syncope, no lightheadedness Endo:+ diabetes with lower glucose so has decreased her insulin , no thyroid disease  Wt Readings from Last 3 Encounters:  03/12/20 151 lb 12 oz (68.8 kg)  03/10/20 151 lb 1.6 oz (68.5 kg)  02/27/20 151 lb 3.8 oz (68.6 kg)     PHYSICAL EXAM: VS:  BP 118/64   Pulse 72   Ht 5' 4.6" (1.641 m)   Wt 151 lb 12 oz (68.8 kg)   LMP 08/08/2009   SpO2 95%   BMI 25.57 kg/m  , BMI Body mass index is 25.57 kg/m. General:Pleasant affect, NAD Skin:Warm and dry, brisk capillary refill HEENT:normocephalic, sclera clear, mucus membranes moist Neck:supple, no JVD, no bruits  Heart:S1S2 RRR without murmur, gallup, rub or click Lungs:clear without rales, rhonchi, or wheezes LMB:EMLJ, non tender, + BS, do not palpate liver spleen or masses Ext:no lower ext edema, 2+ pedal pulses, 2+ radial pulses Neuro:alert and oriented X 3, MAE, follows commands, + facial symmetry    EKG:  EKG is ordered today. The ekg ordered today demonstrates SR at 72 no changes from hospital, stable EKG   Recent  Labs: 01/01/2020: ALT 16; TSH 0.58 02/27/2020: BUN 13; Creatinine, Ser 0.52; Hemoglobin 14.6; Platelets 208; Potassium 4.7; Sodium 139    Lipid Panel    Component Value Date/Time   CHOL 119 01/01/2020 0938   TRIG 61.0 01/01/2020 0938   HDL 32.70 (L) 01/01/2020 0938   CHOLHDL 4 01/01/2020 0938   VLDL 12.2 01/01/2020 0938   LDLCALC 74 01/01/2020 0938       Other studies Reviewed: Additional studies/ records that were reviewed today include: . Cardiac cath 02/22/20 1.  Severe single-vessel coronary artery disease with severe calcific stenosis of a large, dominant RCA 2.  Heavily calcified coronary arteries with multivessel calcification but nonobstructive disease in the left main, LAD, and left circumflex with the exception of severe branch vessel disease in the distal circumflex appropriate for medical therapy 3.  Normal LV function with normal LVEDP, LVEF estimated at 55 to 65%  Recommendations: The RCA is amenable to PCI.  The lesion is complex because of severe calcification and involvement of a large acute marginal branch.  Recommend start clopidogrel 300 mg x 1, then 75 mg daily.  Plan for atherectomy and stenting next week.  Plan reviewed with the patient and her husband.   Coronary arthrectomy   Prox LAD to Mid LAD lesion is 50% stenosed.  Mid Cx lesion is 40% stenosed.  3rd Mrg lesion is 80% stenosed.  Prox RCA to Mid RCA lesion is 95% stenosed.  A drug-eluting stent was successfully placed using a STENT RESOLUTE ONYX 3.5X12.  Post intervention, there is a 0% residual stenosis.   Successful atherectomy and stenting of severe calcific stenosis of the mid right coronary artery, reducing the lesion from 95% to 0% with TIMI-3 flow both pre and post procedure.  ASSESSMENT AND PLAN:  1.  CAD with s/p DES to pRCA to [facility] with atherectomy - has residual chest discomfort but not like her angina prior to stent.  Began post procedure.  Is easing  up with time.  We will add  pepcid 20 mg daily to see if it helps and if significant can try sl NTG.  Continue lipitor, plavix asa  To attend cardiac rehab.  2.  HTN continue meds BP controlled  3.  DM per IM  4.  HLD on statin follow up with PCP  Follow up in 2 months.     Current medicines are reviewed with the patient today.  The patient Has no concerns regarding medicines.  The following changes have been made:  See above Labs/ tests ordered today include:see above  Disposition:   FU:  see above  Signed, Cecilie Kicks, NP  03/12/2020 2:00 PM    [address], [place], [place], [place] Phone: [phone]; Fax: [phone]

## 2020-03-12 NOTE — Patient Instructions (Signed)
Medication Instructions:  Your physician has recommended you make the following change in your medication:  1.  START Pepcid 20 mg taking 1 daily 2.  START Nitroglycerin 0.4 s/l tablet... USE AS DIRECTED ON THE BOTTLE.  *If you need a refill on your cardiac medications before your next appointment, please call your pharmacy*   Lab Work: None ordered If you have labs (blood work) drawn today and your tests are completely normal, you will receive your results only by: Marland Kitchen MyChart Message (if you have MyChart) OR . A paper copy in the mail If you have any lab test that is abnormal or we need to change your treatment, we will call you to review the results.   Testing/Procedures: None ordered   Follow-Up: At [facility], you and your health needs are our priority.  As part of our continuing mission to provide you with exceptional heart care, we have created designated Provider Care Teams.  These Care Teams include your primary Cardiologist (physician) and Advanced Practice Providers (APPs -  Physician Assistants and Nurse Practitioners) who all work together to provide you with the care you need, when you need it.  We recommend signing up for the patient portal called "MyChart".  Sign up information is provided on this After Visit Summary.  MyChart is used to connect with patients for Virtual Visits (Telemedicine).  Patients are able to view lab/test results, encounter notes, upcoming appointments, etc.  Non-urgent messages can be sent to your provider as well.   To learn more about what you can do with MyChart, go to [email or web address].    Your next appointment:   2 month(s)    05/26/2020 2:20   The format for your next appointment:   In Person  Provider:   Fransico Him, MD   Other Instructions

## 2020-03-13 ENCOUNTER — Encounter: Payer: Medicare PPO | Admitting: *Deleted

## 2020-03-13 DIAGNOSIS — Z955 Presence of coronary angioplasty implant and graft: Secondary | ICD-10-CM

## 2020-03-13 LAB — GLUCOSE, CAPILLARY
Glucose-Capillary: 101 mg/dL — ABNORMAL HIGH (ref 70–99)
Glucose-Capillary: 103 mg/dL — ABNORMAL HIGH (ref 70–99)

## 2020-03-13 NOTE — Progress Notes (Signed)
Daily Session Note  Patient Details  Name: Gloria [name] MRN: [id number] Date of Birth: 09-14-1946 Referring Provider:     Cardiac Rehab from 03/10/2020 in [facility] Re[facility]  Referring Provider  Fransico Him MD      Encounter Date: 03/13/2020  Check In: Session Check In - 03/13/20 1348      Check-In   Supervising physician immediately available to respond to emergencies  See telemetry face sheet for immediately available ER MD    Location  [facility]-Cardiac & Pulmonary Rehab    Staff Present  Renita Papa, RN BSN;Joseph [address] [place], [place], [place], CCRP, CCET    Virtual Visit  No    Medication changes reported      No    Fall or balance concerns reported     No    Warm-up and Cool-down  Performed on first and last piece of equipment    Resistance Training Performed  Yes    VAD Patient?  No    PAD/SET Patient?  No      Pain Assessment   Currently in Pain?  No/denies          Social History   Tobacco Use  Smoking Status Never Smoker  Smokeless Tobacco Never Used    Goals Met:  Independence with exercise equipment Exercise tolerated well No report of cardiac concerns or symptoms Strength training completed today  Goals Unmet:  Not Applicable  Comments: First full day of exercise!  Patient was oriented to gym and equipment including functions, settings, policies, and procedures.  Patient's individual exercise prescription and treatment plan were reviewed.  All starting workloads were established based on the results of the 6 minute walk test done at initial orientation visit.  The plan for exercise progression was also introduced and progression will be customized based on patient's performance and goals.    Dr. Emily Filbert is Medical Director for [place] and [facility].

## 2020-03-16 ENCOUNTER — Encounter: Payer: Self-pay | Admitting: Family Medicine

## 2020-03-18 ENCOUNTER — Telehealth: Payer: Self-pay | Admitting: *Deleted

## 2020-03-18 NOTE — Telephone Encounter (Signed)
Call placed to pt re: message below. Left a message to call back.  

## 2020-03-18 NOTE — Telephone Encounter (Signed)
-----   Message from Gloria Serge, NP sent at 03/18/2020  4:00 PM EDT ----- Please check with pt if still has the chronic chest discomfort.  If she does please add imdur 15 mg daily per Dr. Radford Pax recommendations. Thanks. Gloria [name] ----- Message ----- From: Gloria Margarita, MD Sent: 03/16/2020   8:18 PM EDT To: Gloria Serge, NP  He does have other nonobstructive disease so also could try Imdur  Gloria [name] ----- Message ----- From: Gloria Serge, NP Sent: 03/13/2020   9:29 PM EDT To: Gloria Margarita, MD  I saw Gloria [name] recent abnormal cardiac CTA and cath with stent to RCA - since PTCA she has had chronic chest discomfort but no increase with exertion and mild it has improved since discharge.    She will try pepcid.

## 2020-03-19 ENCOUNTER — Other Ambulatory Visit: Payer: Self-pay

## 2020-03-19 ENCOUNTER — Encounter: Payer: Medicare PPO | Admitting: *Deleted

## 2020-03-19 DIAGNOSIS — Z955 Presence of coronary angioplasty implant and graft: Secondary | ICD-10-CM | POA: Diagnosis not present

## 2020-03-19 LAB — GLUCOSE, CAPILLARY
Glucose-Capillary: 109 mg/dL — ABNORMAL HIGH (ref 70–99)
Glucose-Capillary: 131 mg/dL — ABNORMAL HIGH (ref 70–99)

## 2020-03-19 NOTE — Progress Notes (Signed)
Daily Session Note  Patient Details  Name: SAMARI BITTINGER MRN: [id number] Date of Birth: 1945-12-19 Referring Provider:     Cardiac Rehab from 03/10/2020 in [facility] Cardiac and Pulmonary Rehab  Referring Provider  Fransico Him MD      Encounter Date: 03/19/2020  Check In: Session Check In - 03/19/20 1346      Check-In   Supervising physician immediately available to respond to emergencies  See telemetry face sheet for immediately available ER MD    Location  [facility]-Cardiac & Pulmonary Rehab    Staff Present  Renita Papa, RN BSN;Jessica [place], MA, RCEP, CCRP, CCET;Amanda Sommer, [place], ACSM CEP, Exercise Physiologist    Virtual Visit  No    Medication changes reported      No    Fall or balance concerns reported     No    Warm-up and Cool-down  Performed on first and last piece of equipment    Resistance Training Performed  Yes    VAD Patient?  No    PAD/SET Patient?  No      Pain Assessment   Currently in Pain?  No/denies          Social History   Tobacco Use  Smoking Status Never Smoker  Smokeless Tobacco Never Used    Goals Met:  Independence with exercise equipment Exercise tolerated well No report of cardiac concerns or symptoms Strength training completed today  Goals Unmet:  Not Applicable  Comments: Pt able to follow exercise prescription today without complaint.  Will continue to monitor for progression.    Dr. Emily Filbert is Medical Director for [place] and [facility].

## 2020-03-20 ENCOUNTER — Other Ambulatory Visit: Payer: Self-pay

## 2020-03-20 ENCOUNTER — Encounter: Payer: Medicare PPO | Admitting: *Deleted

## 2020-03-20 DIAGNOSIS — Z85828 Personal history of other malignant neoplasm of skin: Secondary | ICD-10-CM | POA: Diagnosis not present

## 2020-03-20 DIAGNOSIS — Z955 Presence of coronary angioplasty implant and graft: Secondary | ICD-10-CM

## 2020-03-20 DIAGNOSIS — L570 Actinic keratosis: Secondary | ICD-10-CM | POA: Diagnosis not present

## 2020-03-20 DIAGNOSIS — D2272 Melanocytic nevi of left lower limb, including hip: Secondary | ICD-10-CM | POA: Diagnosis not present

## 2020-03-20 DIAGNOSIS — C4441 Basal cell carcinoma of skin of scalp and neck: Secondary | ICD-10-CM | POA: Diagnosis not present

## 2020-03-20 DIAGNOSIS — L821 Other seborrheic keratosis: Secondary | ICD-10-CM | POA: Diagnosis not present

## 2020-03-20 DIAGNOSIS — X32XXXA Exposure to sunlight, initial encounter: Secondary | ICD-10-CM | POA: Diagnosis not present

## 2020-03-20 DIAGNOSIS — D485 Neoplasm of uncertain behavior of skin: Secondary | ICD-10-CM | POA: Diagnosis not present

## 2020-03-20 DIAGNOSIS — D2261 Melanocytic nevi of right upper limb, including shoulder: Secondary | ICD-10-CM | POA: Diagnosis not present

## 2020-03-20 DIAGNOSIS — D2262 Melanocytic nevi of left upper limb, including shoulder: Secondary | ICD-10-CM | POA: Diagnosis not present

## 2020-03-20 LAB — GLUCOSE, CAPILLARY
Glucose-Capillary: 124 mg/dL — ABNORMAL HIGH (ref 70–99)
Glucose-Capillary: 126 mg/dL — ABNORMAL HIGH (ref 70–99)

## 2020-03-20 NOTE — Progress Notes (Signed)
Daily Session Note  Patient Details  Name: MIAYA LAFONTANT MRN: [id number] Date of Birth: [birth year] Referring Provider:     Cardiac Rehab from 03/10/2020 in [facility] Cardiac and Pulmonary Rehab  Referring Provider  Fransico Him MD      Encounter Date: 03/20/2020  Check In: Session Check In - 03/20/20 1337      Check-In   Supervising physician immediately available to respond to emergencies  See telemetry face sheet for immediately available ER MD    Location  [facility]-Cardiac & Pulmonary Rehab    Staff Present  Alberteen Sam, MA, RCEP, CCRP, CCET;Joseph Alcus Dad, RN BSN    Virtual Visit  No    Medication changes reported      No    Warm-up and Cool-down  Performed on first and last piece of equipment    Resistance Training Performed  Yes    VAD Patient?  No    PAD/SET Patient?  No      Pain Assessment   Currently in Pain?  No/denies          Social History   Tobacco Use  Smoking Status Never Smoker  Smokeless Tobacco Never Used    Goals Met:  Independence with exercise equipment Exercise tolerated well No report of cardiac concerns or symptoms Strength training completed today  Goals Unmet:  Not Applicable  Comments: Pt able to follow exercise prescription today without complaint.  Will continue to monitor for progression.    Dr. Emily Filbert is Medical Director for [place] and [facility].

## 2020-03-21 ENCOUNTER — Other Ambulatory Visit: Payer: Self-pay | Admitting: Family Medicine

## 2020-03-21 DIAGNOSIS — E119 Type 2 diabetes mellitus without complications: Secondary | ICD-10-CM

## 2020-03-24 ENCOUNTER — Other Ambulatory Visit: Payer: Self-pay

## 2020-03-24 ENCOUNTER — Encounter: Payer: Medicare PPO | Admitting: *Deleted

## 2020-03-24 DIAGNOSIS — Z955 Presence of coronary angioplasty implant and graft: Secondary | ICD-10-CM

## 2020-03-24 NOTE — Progress Notes (Signed)
Daily Session Note  Patient Details  Name: Gloria [name] MRN: [id number] Date of Birth: 1946/04/22 Referring Provider:     Cardiac Rehab from 03/10/2020 in [facility] Card[facility] Pulmonary Rehab  Referring Provider  Fransico Him MD      Encounter Date: 03/24/2020  Check In: Session Check In - 03/24/20 1339      Check-In   Supervising physician immediately available to respond to emergencies  See telemetry face sheet for immediately available ER MD    Location  [facility]-Cardiac & Pulmonary Rehab    Staff Present  Renita Papa, RN BSN;Joseph [address] [place], [place], ACSM CEP, Exercise Physiologist    Virtual Visit  No    Medication changes reported      No    Fall or balance concerns reported     No    Warm-up and Cool-down  Performed on first and last piece of equipment    Resistance Training Performed  Yes    VAD Patient?  No    PAD/SET Patient?  No      Pain Assessment   Currently in Pain?  No/denies          Social History   Tobacco Use  Smoking Status Never Smoker  Smokeless Tobacco Never Used    Goals Met:  Independence with exercise equipment Exercise tolerated well No report of cardiac concerns or symptoms Strength training completed today  Goals Unmet:  Not Applicable  Comments: Pt able to follow exercise prescription today without complaint.  Will continue to monitor for progression.    Dr. Emily Filbert is Medical Director for [place] and [facility].

## 2020-03-26 ENCOUNTER — Encounter: Payer: Medicare PPO | Admitting: *Deleted

## 2020-03-26 ENCOUNTER — Encounter: Payer: Self-pay | Admitting: *Deleted

## 2020-03-26 ENCOUNTER — Other Ambulatory Visit: Payer: Self-pay

## 2020-03-26 DIAGNOSIS — Z955 Presence of coronary angioplasty implant and graft: Secondary | ICD-10-CM

## 2020-03-26 NOTE — Progress Notes (Signed)
Cardiac Individual Treatment Plan  Patient Details  Name: ANASTASYA JEWELL MRN: [id number] Date of Birth: [birth year] Referring Provider:     Cardiac Rehab from 03/10/2020 in [facility] Cardiac and Pulmonary Rehab  Referring Provider  Fransico Him MD      Initial Encounter Date:    Cardiac Rehab from 03/10/2020 in [facility] Cardiac and Pulmonary Rehab  Date  03/10/20      Visit Diagnosis: Status post coronary artery stent placement  Patient's Home Medications on Admission:  Current Outpatient Medications:  .  ACCU-CHEK AVIVA PLUS test strip, CHECK BLOOD SUGAR FOUR TIMES DAILY AS NEEDED, Disp: 100 each, Rfl: 3 .  ACCU-CHEK FASTCLIX LANCETS MISC, Use to check blood sugar 4 times daily as needed.  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  aspirin EC 81 MG tablet, Take 1 tablet (81 mg total) by mouth daily., Disp: 90 tablet, Rfl: 3 .  atorvastatin (LIPITOR) 10 MG tablet, TAKE 1 TABLET BY MOUTH DAILY, Disp: 90 tablet, Rfl: 2 .  Blood Glucose Monitoring Suppl (ACCU-CHEK AVIVA PLUS) W/DEVICE KIT, Use to check blood sugar 4 times daily as needed.  Diagnosis:  E11.9  Insulin Dependent, Disp: 1 kit, Rfl: 0 .  Cholecalciferol (VITAMIN D) 50 MCG (2000 UT) CAPS, Take 2,000 Units by mouth daily. , Disp: , Rfl:  .  clopidogrel (PLAVIX) 75 MG tablet, Take 1 tablet (75 mg total) by mouth daily., Disp: 30 tablet, Rfl: 11 .  famotidine (PEPCID) 20 MG tablet, Take 1 tablet (20 mg total) by mouth daily., Disp: 30 tablet, Rfl: 0 .  hydrochlorothiazide (HYDRODIURIL) 25 MG tablet, TAKE ONE TABLET EVERY DAY, Disp: 90 tablet, Rfl: 2 .  insulin detemir (LEVEMIR FLEXTOUCH) 100 UNIT/ML FlexPen, INJECT 66 UNITS SUBCUTANEOUSLY AT BEDTIME, Disp: 90 mL, Rfl: 0 .  Insulin Pen Needle (B-D ULTRAFINE III SHORT PEN) 31G X 8 MM MISC, USE AS DIRECTED  ** DUE FOR OFFICE VISIT/PHYSICAL, Disp: 100 each, Rfl: 0 .  metFORMIN (GLUCOPHAGE) 1000 MG tablet, TAKE 1 TABLET BY MOUTH TWICE DAILY WITH MEALS, Disp: 180 tablet, Rfl:  3 .  nitroGLYCERIN (NITROSTAT) 0.4 MG SL tablet, Place 1 tablet (0.4 mg total) under the tongue every 5 (five) minutes as needed., Disp: 25 tablet, Rfl: 3 .  Probiotic Product (ALIGN) 4 MG CAPS, Take 4 mg by mouth daily. , Disp: , Rfl:  .  Psyllium (METAMUCIL PO), Take 1 tablet by mouth daily as needed (Constipation). , Disp: , Rfl:  .  quinapril (ACCUPRIL) 20 MG tablet, TAKE ONE TABLET BY MOUTH TWICE DAILY, Disp: 180 tablet, Rfl: 2 .  vitamin C (ASCORBIC ACID) 500 MG tablet, Take 500 mg by mouth daily., Disp: , Rfl:   Past Medical History: Past Medical History:  Diagnosis Date  . ABDOMINAL PAIN OTHER SPECIFIED SITE 08/27/2010   Qualifier: Diagnosis of  By: Damita Dunnings MD, Phillip Heal    . Advance directive discussed with patient 01/03/2012   12/2011-AD d/w pt.  She has talked to husband but doesn't have living will yet.  Full code in discussion with patient today.  Would not want prolonged interventions if she were profoundly and/or permanently impaired, ie severe dementia or a condition with no hope of improvement.   01/13/15-Advance directive d/w pt- husband designated if patient were incapacitated.    . Allergic rhinitis   . Cardiac murmur 2011   with normal echo  . CARDIAC MURMUR 05/26/2010   Qualifier: Diagnosis of  By: Damita Dunnings MD, Phillip Heal    . Diabetes  mellitus    Type II  . Diabetes mellitus without complication ([place]) 4/40/1027   Qualifier: Diagnosis of  By: Council Mechanic MD, Hilaria Ota   . Disorder of bone and cartilage 12/20/2007   2016 DXA: -1.4.  Consider repeat in 2021.     Marland Kitchen Essential hypertension, benign 03/30/2011  . Exertional shortness of breath 01/02/2020  . Female cystocele 08/10/2012  . Health care maintenance 02/09/2018  . Hemorrhoids   . Hyperlipidemia   . Hypertension   . Left shoulder pain 07/20/2016  . Medicare annual wellness visit, subsequent 01/03/2012  . MENOPAUSE, SURGICAL 12/16/2009   Qualifier: Diagnosis of  By: Lurlean Nanny LPN, Regina    . Pure hypercholesterolemia 03/30/2011  . RENAL  CALCULUS 05/24/2007   Qualifier: Diagnosis of  By: Council Mechanic MD, Hilaria Ota   . RHINITIS 12/24/2010   Qualifier: Diagnosis of  By: Damita Dunnings MD, Phillip Heal    . Snoring 01/02/2020  . Ureteral stone with hydronephrosis 08/02/2012    Tobacco Use: Social History   Tobacco Use  Smoking Status Never Smoker  Smokeless Tobacco Never Used    Labs: Recent Review Flowsheet Data    Labs for ITP Cardiac and Pulmonary Rehab Latest Ref Rng & Units 08/29/2017 02/02/2018 06/29/2019 10/01/2019 01/01/2020   Cholestrol 0 - 200 mg/dL - 125 132 - 119   LDLCALC 0 - 99 mg/dL - 79 85 - 74   HDL >39.00 mg/dL - 34.00(L) 33.20(L) - 32.70(L)   Trlycerides 0.0 - 149.0 mg/dL - 58.0 70.0 - 61.0   Hemoglobin A1c 4.0 - 5.6 % 7.7(H) 7.9(H) 8.1(H) 7.5(A) 7.4(A)       Exercise Target Goals: Exercise Program Goal: Individual exercise prescription set using results from initial 6 min walk test and THRR while considering  patient's activity barriers and safety.   Exercise Prescription Goal: Initial exercise prescription builds to 30-45 minutes a day of aerobic activity, 2-3 days per week.  Home exercise guidelines will be given to patient during program as part of exercise prescription that the participant will acknowledge.   Education: Aerobic Exercise & Resistance Training: - Gives group verbal and written instruction on the various components of exercise. Focuses on aerobic and resistive training programs and the benefits of this training and how to safely progress through these programs..   Education: Exercise & Equipment Safety: - Individual verbal instruction and demonstration of equipment use and safety with use of the equipment.   Cardiac Rehab from 03/10/2020 in [facility] Cardiac and Pulmonary Rehab  Date  03/06/20  Educator  [facility]  Instruction Review Code  1- Verbalizes Understanding      Education: Exercise Physiology & General Exercise Guidelines: - Group verbal and written instruction with models to review the  exercise physiology of the cardiovascular system and associated critical values. Provides general exercise guidelines with specific guidelines to those with heart or lung disease.    Education: Flexibility, Balance, Mind/Body Relaxation: Provides group verbal/written instruction on the benefits of flexibility and balance training, including mind/body exercise modes such as yoga, pilates and tai chi.  Demonstration and skill practice provided.   Activity Barriers & Risk Stratification: Activity Barriers & Cardiac Risk Stratification - 03/10/20 1317      Activity Barriers & Cardiac Risk Stratification   Activity Barriers  Arthritis;Joint Problems;Deconditioning;Shortness of Breath;Chest Pain/Angina;Balance Concerns   occasional shoulder and knee pain, limited ROM in shoulders especially overhead   Cardiac Risk Stratification  Moderate       6 Minute Walk: 6 Minute Walk    Row  Name 03/10/20 1316         6 Minute Walk   Phase  Initial     Distance  1266 feet     Walk Time  6 minutes     # of Rest Breaks  0     MPH  2.4     METS  2.68     RPE  9     VO2 Peak  9.39     Symptoms  No     Resting HR  72 bpm     Resting BP  124/62     Resting Oxygen Saturation   96 %     Exercise Oxygen Saturation  during 6 min walk  96 %     Max Ex. HR  102 bpm     Max Ex. BP  128/64     2 Minute Post BP  116/64        Oxygen Initial Assessment:   Oxygen Re-Evaluation:   Oxygen Discharge (Final Oxygen Re-Evaluation):   Initial Exercise Prescription: Initial Exercise Prescription - 03/10/20 1300      Date of Initial Exercise RX and Referring Provider   Date  03/10/20    Referring Provider  Fransico Him MD      Treadmill   MPH  2    Grade  0.5    Minutes  15    METs  2.81      NuStep   Level  2    SPM  80    Minutes  15    METs  2.5      REL-XR   Level  1    Speed  50    Minutes  15    METs  2.5      Biostep-RELP   Level  2    SPM  80    Minutes  15    METs  2       Prescription Details   Frequency (times per week)  3    Duration  Progress to 30 minutes of continuous aerobic without signs/symptoms of physical distress      Intensity   THRR 40-80% of Max Heartrate  102-131    Ratings of Perceived Exertion  11-13    Perceived Dyspnea  0-4      Progression   Progression  Continue to progress workloads to maintain intensity without signs/symptoms of physical distress.      Resistance Training   Training Prescription  Yes    Weight  4 lb    Reps  10-15       Perform Capillary Blood Glucose checks as needed.  Exercise Prescription Changes: Exercise Prescription Changes    Row Name 03/10/20 1300 03/17/20 1600           Response to Exercise   Blood Pressure (Admit)  124/62  126/62      Blood Pressure (Exercise)  128/64  170/70      Blood Pressure (Exit)  116/64  124/64      Heart Rate (Admit)  72 bpm  76 bpm      Heart Rate (Exercise)  102 bpm  113 bpm      Heart Rate (Exit)  73 bpm  87 bpm      Oxygen Saturation (Admit)  96 %  --      Oxygen Saturation (Exercise)  96 %  --      Rating of Perceived Exertion (Exercise)  9  15  Symptoms  none  none      Comments  walk test results  --      Duration  --  Progress to 30 minutes of  aerobic without signs/symptoms of physical distress      Intensity  --  THRR unchanged        Progression   Progression  --  Continue to progress workloads to maintain intensity without signs/symptoms of physical distress.      Average METs  --  3        Resistance Training   Training Prescription  --  Yes      Weight  --  4 lb      Reps  --  10-15        NuStep   Level  --  2      SPM  --  80      Minutes  --  15      METs  --  3        Biostep-RELP   Level  --  2      SPM  --  80      Minutes  --  15      METs  --  3         Exercise Comments:   Exercise Goals and Review: Exercise Goals    Row Name 03/10/20 1326             Exercise Goals   Increase Physical Activity  Yes        Intervention  Provide advice, education, support and counseling about physical activity/exercise needs.;Develop an individualized exercise prescription for aerobic and resistive training based on initial evaluation findings, risk stratification, comorbidities and participant's personal goals.       Expected Outcomes  Short Term: Attend rehab on a regular basis to increase amount of physical activity.;Long Term: Add in home exercise to make exercise part of routine and to increase amount of physical activity.;Long Term: Exercising regularly at least 3-5 days a week.       Increase Strength and Stamina  Yes       Intervention  Provide advice, education, support and counseling about physical activity/exercise needs.;Develop an individualized exercise prescription for aerobic and resistive training based on initial evaluation findings, risk stratification, comorbidities and participant's personal goals.       Expected Outcomes  Short Term: Increase workloads from initial exercise prescription for resistance, speed, and METs.;Long Term: Improve cardiorespiratory fitness, muscular endurance and strength as measured by increased METs and functional capacity (6MWT);Short Term: Perform resistance training exercises routinely during rehab and add in resistance training at home       Able to understand and use rate of perceived exertion (RPE) scale  Yes       Intervention  Provide education and explanation on how to use RPE scale       Expected Outcomes  Short Term: Able to use RPE daily in rehab to express subjective intensity level;Long Term:  Able to use RPE to guide intensity level when exercising independently       Able to understand and use Dyspnea scale  Yes       Intervention  Provide education and explanation on how to use Dyspnea scale       Expected Outcomes  Short Term: Able to use Dyspnea scale daily in rehab to express subjective sense of shortness of breath during exertion;Long Term: Able to use  Dyspnea scale to guide intensity  level when exercising independently       Knowledge and understanding of Target Heart Rate Range (THRR)  Yes       Intervention  Provide education and explanation of THRR including how the numbers were predicted and where they are located for reference       Expected Outcomes  Short Term: Able to state/look up THRR;Short Term: Able to use daily as guideline for intensity in rehab;Long Term: Able to use THRR to govern intensity when exercising independently       Able to check pulse independently  Yes       Intervention  Provide education and demonstration on how to check pulse in carotid and radial arteries.;Review the importance of being able to check your own pulse for safety during independent exercise       Expected Outcomes  Short Term: Able to explain why pulse checking is important during independent exercise;Long Term: Able to check pulse independently and accurately       Understanding of Exercise Prescription  Yes       Intervention  Provide education, explanation, and written materials on patient's individual exercise prescription       Expected Outcomes  Short Term: Able to explain program exercise prescription;Long Term: Able to explain home exercise prescription to exercise independently          Exercise Goals Re-Evaluation : Exercise Goals Re-Evaluation    [place] Name 03/13/20 1349             Exercise Goal Re-Evaluation   Exercise Goals Review  Increase Physical Activity;Able to understand and use rate of perceived exertion (RPE) scale;Knowledge and understanding of Target Heart Rate Range (THRR);Understanding of Exercise Prescription;Increase Strength and Stamina;Able to check pulse independently       Comments  Reviewed RPE and dyspnea scales, THR and program prescription with pt today.  Pt voiced understanding and was given a copy of goals to take home.       Expected Outcomes  Short: Use RPE daily to regulate intensity. Long: Follow program  prescription in THR.          Discharge Exercise Prescription (Final Exercise Prescription Changes): Exercise Prescription Changes - 03/17/20 1600      Response to Exercise   Blood Pressure (Admit)  126/62    Blood Pressure (Exercise)  170/70    Blood Pressure (Exit)  124/64    Heart Rate (Admit)  76 bpm    Heart Rate (Exercise)  113 bpm    Heart Rate (Exit)  87 bpm    Rating of Perceived Exertion (Exercise)  15    Symptoms  none    Duration  Progress to 30 minutes of  aerobic without signs/symptoms of physical distress    Intensity  THRR unchanged      Progression   Progression  Continue to progress workloads to maintain intensity without signs/symptoms of physical distress.    Average METs  3      Resistance Training   Training Prescription  Yes    Weight  4 lb    Reps  10-15      NuStep   Level  2    SPM  80    Minutes  15    METs  3      Biostep-RELP   Level  2    SPM  80    Minutes  15    METs  3       Nutrition:  Target Goals: Understanding  of nutrition guidelines, daily intake of sodium <159m, cholesterol <2023m calories 30% from fat and 7% or less from saturated fats, daily to have 5 or more servings of fruits and vegetables.  Education: Controlling Sodium/Reading Food Labels -Group verbal and written material supporting the discussion of sodium use in heart healthy nutrition. Review and explanation with models, verbal and written materials for utilization of the food label.   Education: General Nutrition Guidelines/Fats and Fiber: -Group instruction provided by verbal, written material, models and posters to present the general guidelines for heart healthy nutrition. Gives an explanation and review of dietary fats and fiber.   Biometrics: Pre Biometrics - 03/10/20 1326      Pre Biometrics   Height  5' 4.6" (1.641 m)    Weight  151 lb 1.6 oz (68.5 kg)    BMI (Calculated)  25.45    Single Leg Stand  7.78 seconds        Nutrition Therapy Plan  and Nutrition Goals:   Nutrition Assessments: Nutrition Assessments - 03/10/20 1327      MEDFICTS Scores   Pre Score  20       MEDIFICTS Score Key:          ?70 Need to make dietary changes          40-70 Heart Healthy Diet         ? 40 Therapeutic Level Cholesterol Diet  Nutrition Goals Re-Evaluation:   Nutrition Goals Discharge (Final Nutrition Goals Re-Evaluation):   Psychosocial: Target Goals: Acknowledge presence or absence of significant depression and/or stress, maximize coping skills, provide positive support system. Participant is able to verbalize types and ability to use techniques and skills needed for reducing stress and depression.   Education: Depression - Provides group verbal and written instruction on the correlation between heart/lung disease and depressed mood, treatment options, and the stigmas associated with seeking treatment.   Education: Sleep Hygiene -Provides group verbal and written instruction about how sleep can affect your health.  Define sleep hygiene, discuss sleep cycles and impact of sleep habits. Review good sleep hygiene tips.     Education: Stress and Anxiety: - Provides group verbal and written instruction about the health risks of elevated stress and causes of high stress.  Discuss the correlation between heart/lung disease and anxiety and treatment options. Review healthy ways to manage with stress and anxiety.    Initial Review & Psychosocial Screening: Initial Psych Review & Screening - 03/06/20 1034      Initial Review   Current issues with  Current Sleep Concerns      Family Dynamics   Good Support System?  Yes    Comments  She states that she has moderate sleep apnea. She has to go back to the dentist to get a mouthpiece to help with her Apnea. She can look to her husband, daughter and son for support.      Barriers   Psychosocial barriers to participate in program  The patient should benefit from training in stress  management and relaxation.      Screening Interventions   Interventions  Encouraged to exercise;Provide feedback about the scores to participant;To provide support and resources with identified psychosocial needs    Expected Outcomes  Short Term goal: Utilizing psychosocial counselor, staff and physician to assist with identification of specific Stressors or current issues interfering with healing process. Setting desired goal for each stressor or current issue identified.;Long Term Goal: Stressors or current issues are controlled or eliminated.;Short Term goal:  Identification and review with participant of any Quality of Life or Depression concerns found by scoring the questionnaire.;Long Term goal: The participant improves quality of Life and PHQ9 Scores as seen by post scores and/or verbalization of changes       Quality of Life Scores:  Quality of Life - 03/10/20 1327      Quality of Life   Select  Quality of Life      Quality of Life Scores   Health/Function Pre  21.43 %    Socioeconomic Pre  24.07 %    Psych/Spiritual Pre  23.14 %    Family Pre  28.8 %    GLOBAL Pre  23.41 %      Scores of 19 and below usually indicate a poorer quality of life in these areas.  A difference of  2-3 points is a clinically meaningful difference.  A difference of 2-3 points in the total score of the Quality of Life Index has been associated with significant improvement in overall quality of life, self-image, physical symptoms, and general health in studies assessing change in quality of life.  PHQ-9: Recent Review Flowsheet Data    Depression screen [facility] 2/9 03/10/2020 06/29/2019 02/02/2018 01/25/2017 01/15/2016   Decreased Interest 0 0 0 0 0   Down, Depressed, Hopeless 0 0 0 0 0   PHQ - 2 Score 0 0 0 0 0   Altered sleeping 2 0 0 - -   Tired, decreased energy 2 0 0 - -   Change in appetite 0 0 0 - -   Feeling bad or failure about yourself  0 0 0 - -   Trouble concentrating 0 0 0 - -   Moving slowly or  fidgety/restless 0 0 0 - -   Suicidal thoughts 0 0 0 - -   PHQ-9 Score 4 0 0 - -   Difficult doing work/chores Somewhat difficult - Not difficult at all - -     Interpretation of Total Score  Total Score Depression Severity:  1-4 = Minimal depression, 5-9 = Mild depression, 10-14 = Moderate depression, 15-19 = Moderately severe depression, 20-27 = Severe depression   Psychosocial Evaluation and Intervention: Psychosocial Evaluation - 03/06/20 1038      Psychosocial Evaluation & Interventions   Interventions  Encouraged to exercise with the program and follow exercise prescription    Comments  She states that she has moderate sleep apnea. She has to go back to the dentist to get a mouthpiece to help with her Apnea. She can look to her husband, daughter and son for support.    Expected Outcomes  Short: Attend HeartTrack stress management education to decrease stress. Long: Maintain exercise Post HeartTrack to keep stress at a minimum.    Continue Psychosocial Services   Follow up required by staff       Psychosocial Re-Evaluation:   Psychosocial Discharge (Final Psychosocial Re-Evaluation):   Vocational Rehabilitation: Provide vocational rehab assistance to qualifying candidates.   Vocational Rehab Evaluation & Intervention:   Education: Education Goals: Education classes will be provided on a variety of topics geared toward better understanding of heart health and risk factor modification. Participant will state understanding/return demonstration of topics presented as noted by education test scores.  Learning Barriers/Preferences: Learning Barriers/Preferences - 03/06/20 1036      Learning Barriers/Preferences   Learning Barriers  Sight    Learning Preferences  None       General Cardiac Education Topics:  AED/CPR: - Group verbal  and written instruction with the use of models to demonstrate the basic use of the AED with the basic ABC's of resuscitation.   Anatomy &  Physiology of the Heart: - Group verbal and written instruction and models provide basic cardiac anatomy and physiology, with the coronary electrical and arterial systems. Review of Valvular disease and Heart Failure   Cardiac Procedures: - Group verbal and written instruction to review commonly prescribed medications for heart disease. Reviews the medication, class of the drug, and side effects. Includes the steps to properly store meds and maintain the prescription regimen. (beta blockers and nitrates)   Cardiac Medications I: - Group verbal and written instruction to review commonly prescribed medications for heart disease. Reviews the medication, class of the drug, and side effects. Includes the steps to properly store meds and maintain the prescription regimen.   Cardiac Medications II: -Group verbal and written instruction to review commonly prescribed medications for heart disease. Reviews the medication, class of the drug, and side effects. (all other drug classes)    Go Sex-Intimacy & Heart Disease, Get SMART - Goal Setting: - Group verbal and written instruction through game format to discuss heart disease and the return to sexual intimacy. Provides group verbal and written material to discuss and apply goal setting through the application of the S.M.A.R.T. Method.   Other Matters of the Heart: - Provides group verbal, written materials and models to describe Stable Angina and Peripheral Artery. Includes description of the disease process and treatment options available to the cardiac patient.   Infection Prevention: - Provides verbal and written material to individual with discussion of infection control including proper hand washing and proper equipment cleaning during exercise session.   Cardiac Rehab from 03/10/2020 in [facility] Cardiac and Pulmonary Rehab  Date  03/06/20  Educator  [facility]  Instruction Review Code  1- Verbalizes Understanding      Falls Prevention: - Provides  verbal and written material to individual with discussion of falls prevention and safety.   Cardiac Rehab from 03/10/2020 in [facility] Cardiac and Pulmonary Rehab  Date  03/06/20  Educator  [facility]  Instruction Review Code  1- Verbalizes Understanding      Other: -Provides group and verbal instruction on various topics (see comments)   Knowledge Questionnaire Score: Knowledge Questionnaire Score - 03/10/20 1327      Knowledge Questionnaire Score   Pre Score  24/26 Education Focus: Exercise and Nutriton       Core Components/Risk Factors/Patient Goals at Admission: Personal Goals and Risk Factors at Admission - 03/10/20 1328      Core Components/Risk Factors/Patient Goals on Admission    Weight Management  Yes;Weight Maintenance    Intervention  Weight Management: Develop a combined nutrition and exercise program designed to reach desired caloric intake, while maintaining appropriate intake of nutrient and fiber, sodium and fats, and appropriate energy expenditure required for the weight goal.;Weight Management: Provide education and appropriate resources to help participant work on and attain dietary goals.    Admit Weight  151 lb 1.6 oz (68.5 kg)    Goal Weight: Short Term  145 lb (65.8 kg)    Goal Weight: Long Term  140 lb (63.5 kg)    Expected Outcomes  Short Term: Continue to assess and modify interventions until short term weight is achieved;Weight Loss: Understanding of general recommendations for a balanced deficit meal plan, which promotes 1-2 lb weight loss per week and includes a negative energy balance of [phone] kcal/d;Understanding recommendations for meals to  include 15-35% energy as protein, 25-35% energy from fat, 35-60% energy from carbohydrates, less than 258m of dietary cholesterol, 20-35 gm of total fiber daily;Understanding of distribution of calorie intake throughout the day with the consumption of 4-5 meals/snacks;Long Term: Adherence to nutrition and physical  activity/exercise program aimed toward attainment of established weight goal    Diabetes  Yes    Intervention  Provide education about signs/symptoms and action to take for hypo/hyperglycemia.;Provide education about proper nutrition, including hydration, and aerobic/resistive exercise prescription along with prescribed medications to achieve blood glucose in normal ranges: Fasting glucose 65-99 mg/dL    Expected Outcomes  Short Term: Participant verbalizes understanding of the signs/symptoms and immediate care of hyper/hypoglycemia, proper foot care and importance of medication, aerobic/resistive exercise and nutrition plan for blood glucose control.;Long Term: Attainment of HbA1C < 7%.    Lipids  Yes    Intervention  Provide education and support for participant on nutrition & aerobic/resistive exercise along with prescribed medications to achieve LDL <715m HDL >4057m   Expected Outcomes  Short Term: Participant states understanding of desired cholesterol values and is compliant with medications prescribed. Participant is following exercise prescription and nutrition guidelines.;Long Term: Cholesterol controlled with medications as prescribed, with individualized exercise RX and with personalized nutrition plan. Value goals: LDL < 75m34mDL > 40 mg.       Education:Diabetes - Individual verbal and written instruction to review signs/symptoms of diabetes, desired ranges of glucose level fasting, after meals and with exercise. Acknowledge that pre and post exercise glucose checks will be done for 3 sessions at entry of program.   [facility]  Date  03/06/20  Educator  JH  [facility]ruction Review Code  1- Verbalizes Understanding      Education: Know Your Numbers and Risk Factors: -Group verbal and written instruction about important numbers in your health.  Discussion of what are risk factors and how they play a role in the disease process.  Review  of Cholesterol, Blood Pressure, Diabetes, and BMI and the role they play in your overall health.   Core Components/Risk Factors/Patient Goals Review:    Core Components/Risk Factors/Patient Goals at Discharge (Final Review):    ITP Comments: ITP Comments    Row Name 03/06/20 1040 03/10/20 1315 03/13/20 1349 03/26/20 0613     ITP Comments  Virtual Orientation performed. Patient informed when to come in for RD and EP orientation. Diagnosis can be found in [facility].  Completed 6MWT and gym orientation.  Initial ITP created and sent for review to Dr. MarkEmily Filbertdical Director.  First full day of exercise!  Patient was oriented to gym and equipment including functions, settings, policies, and procedures.  Patient's individual exercise prescription and treatment plan were reviewed.  All starting workloads were established based on the results of the 6 minute walk test done at initial orientation visit.  The plan for exercise progression was also introduced and progression will be customized based on patient's performance and goals  30 Day review completed. ITP review done, changes made as directed,and approval shown by signature of  progScientist, research (life sciences)    Comments:

## 2020-03-26 NOTE — Telephone Encounter (Signed)
Spoke with pt and she stated that since she started Pepcid, it has helped tremendously.  She also stated that she has started cardiac rehab, and she feels ok with waiting to see if it works itself out. Pt advised to call us if she needs [place]. Pt was grateful for the follow-up.

## 2020-03-26 NOTE — Progress Notes (Signed)
Daily Session Note  Patient Details  Name: Gloria [name] MRN: [id number] Date of Birth: Mar 14, 1946 Referring Provider:     Cardiac Rehab from 03/10/2020 in [facility] Cardiac and Pulmonary Rehab  Referring Provider  Fransico Him MD      Encounter Date: 03/26/2020  Check In: Session Check In - 03/26/20 1327      Check-In   Supervising physician immediately available to respond to emergencies  See telemetry face sheet for immediately available ER MD    Location  [facility]-Cardiac & Pulmonary Rehab    Staff Present  Renita Papa, RN BSN;Jessica [place]ova, MA, RCEP, CCRP, CCET;Amanda Sommer, [place], ACSM CEP, Exercise Physiologist;Melissa Caiola RDN, LDN    Virtual Visit  No    Medication changes reported      No    Fall or balance concerns reported     No    Warm-up and Cool-down  Performed on first and last piece of equipment    Resistance Training Performed  Yes    VAD Patient?  No    PAD/SET Patient?  No      Pain Assessment   Currently in Pain?  No/denies          Social History   Tobacco Use  Smoking Status Never Smoker  Smokeless Tobacco Never Used    Goals Met:  Independence with exercise equipment Exercise tolerated well No report of cardiac concerns or symptoms Strength training completed today  Goals Unmet:  Not Applicable  Comments: Pt able to follow exercise prescription today without complaint.  Will continue to monitor for progression.    Dr. Emily Filbert is Medical Director for [place] and [facility].

## 2020-03-27 ENCOUNTER — Encounter: Payer: Medicare PPO | Admitting: *Deleted

## 2020-03-27 ENCOUNTER — Other Ambulatory Visit: Payer: Self-pay

## 2020-03-27 DIAGNOSIS — Z955 Presence of coronary angioplasty implant and graft: Secondary | ICD-10-CM

## 2020-03-27 NOTE — Progress Notes (Signed)
Daily Session Note  Patient Details  Name: Gloria [name] MRN: [id number] Date of Birth: 01/29/1946 Referring Provider:     Cardiac Rehab from 03/10/2020 in [facility] Cardiac and Pulmonary Rehab  Referring Provider  Turner, Traci MD      Encounter Date: 03/27/2020  Check In: Session Check In - 03/27/20 1326      Check-In   Supervising physician immediately available to respond to emergencies  See telemetry face sheet for immediately available ER MD    Location  [facility]-Cardiac & Pulmonary Rehab    Staff Present  Meredith Craven, RN BSN;Jessica Hawkins, MA, RCEP, CCRP, CCET;Amanda Sommer, BA, ACSM CEP, Exercise Physiologist    Virtual Visit  No    Medication changes reported      No    Fall or balance concerns reported     No    Warm-up and Cool-down  Performed on first and last piece of equipment    Resistance Training Performed  Yes    VAD Patient?  No    PAD/SET Patient?  No      Pain Assessment   Currently in Pain?  No/denies          Social History   Tobacco Use  Smoking Status Never Smoker  Smokeless Tobacco Never Used    Goals Met:  Independence with exercise equipment Exercise tolerated well No report of cardiac concerns or symptoms Strength training completed today  Goals Unmet:  Not Applicable  Comments: Pt able to follow exercise prescription today without complaint.  Will continue to monitor for progression.    Dr. Mark Miller is Medical Director for [facility] and [facility]. 

## 2020-03-28 ENCOUNTER — Ambulatory Visit (HOSPITAL_COMMUNITY): Payer: Medicare PPO | Attending: Cardiology

## 2020-03-28 ENCOUNTER — Other Ambulatory Visit: Payer: Self-pay

## 2020-03-28 DIAGNOSIS — I209 Angina pectoris, unspecified: Secondary | ICD-10-CM | POA: Diagnosis not present

## 2020-03-28 DIAGNOSIS — R011 Cardiac murmur, unspecified: Secondary | ICD-10-CM | POA: Insufficient documentation

## 2020-03-28 DIAGNOSIS — I350 Nonrheumatic aortic (valve) stenosis: Secondary | ICD-10-CM | POA: Diagnosis not present

## 2020-03-31 ENCOUNTER — Ambulatory Visit: Payer: Medicare PPO | Admitting: Family Medicine

## 2020-03-31 ENCOUNTER — Ambulatory Visit (INDEPENDENT_AMBULATORY_CARE_PROVIDER_SITE_OTHER)
Admission: RE | Admit: 2020-03-31 | Discharge: 2020-03-31 | Disposition: A | Discharge status: Home / Self Care | Payer: Medicare PPO | Source: Ambulatory Visit | Attending: Family Medicine | Admitting: Family Medicine

## 2020-03-31 ENCOUNTER — Telehealth: Payer: Self-pay | Admitting: Cardiology

## 2020-03-31 ENCOUNTER — Encounter: Payer: Medicare PPO | Admitting: *Deleted

## 2020-03-31 ENCOUNTER — Encounter: Payer: Self-pay | Admitting: Family Medicine

## 2020-03-31 ENCOUNTER — Other Ambulatory Visit: Payer: Self-pay

## 2020-03-31 VITALS — BP 104/60 | HR 74 | Temp 97.6°F | Ht 64.5 in | Wt 147.0 lb

## 2020-03-31 DIAGNOSIS — E119 Type 2 diabetes mellitus without complications: Secondary | ICD-10-CM

## 2020-03-31 DIAGNOSIS — I25119 Atherosclerotic heart disease of native coronary artery with unspecified angina pectoris: Secondary | ICD-10-CM | POA: Diagnosis not present

## 2020-03-31 DIAGNOSIS — M79601 Pain in right arm: Secondary | ICD-10-CM

## 2020-03-31 DIAGNOSIS — Z955 Presence of coronary angioplasty implant and graft: Secondary | ICD-10-CM

## 2020-03-31 DIAGNOSIS — M47812 Spondylosis without myelopathy or radiculopathy, cervical region: Secondary | ICD-10-CM | POA: Diagnosis not present

## 2020-03-31 LAB — LIPID PANEL
Cholesterol: 128 mg/dL (ref 0–200)
HDL: 31.3 mg/dL — ABNORMAL LOW (ref >39.00)
LDL Cholesterol: 81 mg/dL (ref 0–99)
NonHDL: 96.85
Total CHOL/HDL Ratio: 4
Triglycerides: 77 mg/dL (ref 0.0–149.0)
VLDL: 15.4 mg/dL (ref 0.0–40.0)

## 2020-03-31 LAB — POCT GLYCOSYLATED HEMOGLOBIN (HGB A1C): Hemoglobin A1C: 7 % — AB (ref 4.0–5.6)

## 2020-03-31 MED ORDER — LEVEMIR FLEXTOUCH 100 UNIT/ML ~~LOC~~ SOPN: PEN_INJECTOR | SUBCUTANEOUS | 0 refills | Status: DC

## 2020-03-31 NOTE — Telephone Encounter (Signed)
New Message    Pt is returning call to [facility] for echo results  Call transferred to  [facility]

## 2020-03-31 NOTE — Progress Notes (Unsigned)
The patient has been notified of the result and verbalized understanding.  All questions (if any) were answered. Antonieta Iba, RN 03/31/2020 2:52 PM

## 2020-03-31 NOTE — Progress Notes (Signed)
Daily Session Note  Patient Details  Name: Gloria [name] MRN: [id number] Date of Birth: November 13, 1945 Referring Provider:     Cardiac Rehab from 03/10/2020 in [facility] Cardiac and Pulmonary Rehab  Referring Provider  Fransico Him MD      Encounter Date: 03/31/2020  Check In: Session Check In - 03/31/20 1337      Check-In   Supervising physician immediately available to respond to emergencies  See telemetry face sheet for immediately available ER MD    Location  [facility]-Cardiac & Pulmonary Rehab    Staff Present  Renita Papa, RN BSN;Joseph Hood RCP,RRT,BSRT;Melissa [place] RDN, LDN    Virtual Visit  No    Medication changes reported      No    Fall or balance concerns reported     No    Warm-up and Cool-down  Performed on first and last piece of equipment    Resistance Training Performed  Yes    VAD Patient?  No    PAD/SET Patient?  No      Pain Assessment   Currently in Pain?  No/denies          Social History   Tobacco Use  Smoking Status Never Smoker  Smokeless Tobacco Never Used    Goals Met:  Independence with exercise equipment Exercise tolerated well No report of cardiac concerns or symptoms Strength training completed today  Goals Unmet:  Not Applicable  Comments: Pt able to follow exercise prescription today without complaint.  Will continue to monitor for progression.    Dr. Emily Filbert is Medical Director for [place] and [facility].

## 2020-03-31 NOTE — Patient Instructions (Addendum)
If you have low sugars, then keep cutting back on insulin.  Go to the lab on the way out.   If you have mychart we'll likely use that to update you.    Take care.  Glad to see you. If you get lightheaded, then skip HCTZ and update me.  Plan on recheck in 3 months.

## 2020-03-31 NOTE — Progress Notes (Signed)
is visit occurred during the SARS-CoV-2 public health emergency.  Safety protocols were in place, including screening questions prior to the visit, additional usage of staff PPE, and extensive cleaning of exam room while observing appropriate contact time as indicated for disinfecting solutions.  Diabetes:  Using medications without difficulties: yes Hypoglycemic episodes: no Hyperglycemic episodes: not since she has cut back on insulin, d/w pt Feet problems:no  Blood Sugars averaging: <100 initially, she cut back on insulin, now 90-120 eye exam within last year: yes She is on less insulin with inc work on diet.    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  She had some sig pain down the R arm, not just at the shoulder joint.  She has normal ROM at the R shoulder.  Waking up at night with pain.    CAD d/w pt.  Echo d/w pt.   Doing well.  She is working on diet and exercise.  She cut back on salt.   Lipids pending.  No SOB with walking- that is clearly better.  pepcid helped with heaviness in the chest that was likely died related (ie inc in beans). No exertional SOB.    Meds, vitals, and allergies reviewed.  ROS: Per HPI unless specifically indicated in ROS section   GEN: nad, alert and oriented HEENT: ncat NECK: supple w/o LA CV: rrr. PULM: ctab, no inc wob ABD: soft, +bs EXT: no edema SKIN: no acute rash R trap ttp. Normal neck ROM.

## 2020-04-02 ENCOUNTER — Other Ambulatory Visit: Payer: Self-pay | Admitting: Family Medicine

## 2020-04-02 ENCOUNTER — Other Ambulatory Visit: Payer: Self-pay

## 2020-04-02 ENCOUNTER — Encounter: Payer: Medicare PPO | Admitting: *Deleted

## 2020-04-02 DIAGNOSIS — Z955 Presence of coronary angioplasty implant and graft: Secondary | ICD-10-CM

## 2020-04-02 DIAGNOSIS — G8929 Other chronic pain: Secondary | ICD-10-CM | POA: Insufficient documentation

## 2020-04-02 MED ORDER — ATORVASTATIN CALCIUM 10 MG PO TABS: 20.0000 mg | ORAL_TABLET | Freq: Every day | ORAL | Status: DC

## 2020-04-02 NOTE — Assessment & Plan Note (Signed)
Normal neck range of motion and sensation/grip normal for the bilateral hands.  Reasonable to check plain films of the neck in the meantime.  See notes on imaging.  Okay for outpatient follow-up.

## 2020-04-02 NOTE — Assessment & Plan Note (Signed)
Not short of breath now.  Doing well.  Discussed options.  If she is getting lightheaded at all then I want her to cut back on hydrochlorothiazide and update me.  She agrees.  See notes on labs.

## 2020-04-02 NOTE — Progress Notes (Signed)
Daily Session Note  Patient Details  Name: Gloria [name] MRN: [id number] Date of Birth: [birth year] Referring Provider:     Cardiac Rehab from 03/10/2020 in [facility] Cardiac and Pulmonary Rehab  Referring Provider  Fransico Him MD      Encounter Date: 04/02/2020  Check In: Session Check In - 04/02/20 1329      Check-In   Supervising physician immediately available to respond to emergencies  See telemetry face sheet for immediately available ER MD    Location  [facility]-Cardiac & Pulmonary Rehab    Staff Present  Renita Papa, RN BSN;Jessica [place], MA, RCEP, CCRP, CCET;Melissa [place] RDN, [place]e [place], [place], ACSM CEP, Exercise Physiologist    Virtual Visit  No    Medication changes reported      No    Fall or balance concerns reported     No    Warm-up and Cool-down  Performed on first and last piece of equipment    Resistance Training Performed  Yes    VAD Patient?  No    PAD/SET Patient?  No      Pain Assessment   Currently in Pain?  No/denies          Social History   Tobacco Use  Smoking Status Never Smoker  Smokeless Tobacco Never Used    Goals Met:  Independence with exercise equipment Exercise tolerated well No report of cardiac concerns or symptoms Strength training completed today  Goals Unmet:  Not Applicable  Comments: Pt able to follow exercise prescription today without complaint.  Will continue to monitor for progression.    Dr. Emily Filbert is Medical Director for [place] and [facility].

## 2020-04-02 NOTE — Assessment & Plan Note (Signed)
She is working on her diet and she has been able to cut back on insulin.  See orders.  See notes on labs.  Okay for outpatient follow-up.  See after visit summary.

## 2020-04-03 ENCOUNTER — Other Ambulatory Visit: Payer: Self-pay

## 2020-04-03 ENCOUNTER — Encounter: Payer: Medicare PPO | Admitting: *Deleted

## 2020-04-03 DIAGNOSIS — Z955 Presence of coronary angioplasty implant and graft: Secondary | ICD-10-CM | POA: Diagnosis not present

## 2020-04-03 DIAGNOSIS — R922 Inconclusive mammogram: Secondary | ICD-10-CM | POA: Diagnosis not present

## 2020-04-03 LAB — HM MAMMOGRAPHY

## 2020-04-03 NOTE — Progress Notes (Signed)
Daily Session Note  Patient Details  Name: Gloria [name] MRN: [id number] Date of Birth: 1946/09/06 Referring Provider:     Cardiac Rehab from 03/10/2020 in [facility] Cardiac and Pulmonary Rehab  Referring Provider  Fransico Him MD      Encounter Date: 04/03/2020  Check In: Session Check In - 04/03/20 1418      Check-In   Supervising physician immediately available to respond to emergencies  See telemetry face sheet for immediately available ER MD    Location  [facility]-Cardiac & Pulmonary Rehab    Staff Present  Justin Mend RCP,RRT,BSRT;Morrill Bomkamp Sherryll Burger, RN BSN;Jessica Luan Pulling, MA, RCEP, CCRP, CCET    Virtual Visit  No    Medication changes reported      No    Fall or balance concerns reported     No    Warm-up and Cool-down  Performed on first and last piece of equipment    Resistance Training Performed  Yes    VAD Patient?  No    PAD/SET Patient?  No      Pain Assessment   Currently in Pain?  No/denies          Social History   Tobacco Use  Smoking Status Never Smoker  Smokeless Tobacco Never Used    Goals Met:  Independence with exercise equipment Exercise tolerated well No report of cardiac concerns or symptoms Strength training completed today  Goals Unmet:  Not Applicable  Comments: Pt able to follow exercise prescription today without complaint.  Will continue to monitor for progression.    Dr. Emily Filbert is Medical Director for [place] and [facility].

## 2020-04-08 ENCOUNTER — Other Ambulatory Visit: Payer: Self-pay | Admitting: Cardiology

## 2020-04-08 ENCOUNTER — Encounter: Payer: Self-pay | Admitting: Family Medicine

## 2020-04-08 ENCOUNTER — Other Ambulatory Visit: Payer: Self-pay | Admitting: Family Medicine

## 2020-04-09 ENCOUNTER — Other Ambulatory Visit: Payer: Self-pay

## 2020-04-09 ENCOUNTER — Encounter: Payer: Medicare PPO | Attending: Cardiology | Admitting: *Deleted

## 2020-04-09 DIAGNOSIS — Z955 Presence of coronary angioplasty implant and graft: Secondary | ICD-10-CM | POA: Diagnosis not present

## 2020-04-09 NOTE — Progress Notes (Signed)
Daily Session Note  Patient Details  Name: Gloria [name] MRN: [id number] Date of Birth: April 28, 1946 Referring Provider:     Cardiac Rehab from 03/10/2020 in [facility] Cardiac and Pulmonary Rehab  Referring Provider  Fransico Him MD      Encounter Date: 04/09/2020  Check In: Session Check In - 04/09/20 1328      Check-In   Supervising physician immediately available to respond to emergencies  See telemetry face sheet for immediately available ER MD    Staff Present  Renita Papa, RN Vickki Hearing, BA, ACSM CEP, Exercise Physiologist;Jessica Luan Pulling, MA, RCEP, CCRP, CCET    Virtual Visit  No    Medication changes reported      Yes    Comments  increased Lipitor to 29m    Fall or balance concerns reported     No    Warm-up and Cool-down  Performed on first and last piece of equipment    Resistance Training Performed  Yes    VAD Patient?  No    PAD/SET Patient?  No      Pain Assessment   Currently in Pain?  No/denies          Social History   Tobacco Use  Smoking Status Never Smoker  Smokeless Tobacco Never Used    Goals Met:  Independence with exercise equipment Exercise tolerated well No report of cardiac concerns or symptoms Strength training completed today  Goals Unmet:  Not Applicable  Comments: Pt able to follow exercise prescription today without complaint.  Will continue to monitor for progression.    Dr. MEmily Filbertis Medical Director for [facility].

## 2020-04-09 NOTE — Telephone Encounter (Signed)
Pt's pharmacy is requesting a refill on famotidine. Would Dr. Radford Pax like to refill this medication? Please address

## 2020-04-10 ENCOUNTER — Other Ambulatory Visit: Payer: Self-pay

## 2020-04-10 ENCOUNTER — Encounter: Payer: Medicare PPO | Admitting: *Deleted

## 2020-04-10 DIAGNOSIS — Z955 Presence of coronary angioplasty implant and graft: Secondary | ICD-10-CM | POA: Diagnosis not present

## 2020-04-10 NOTE — Progress Notes (Signed)
Daily Session Note  Patient Details  Name: RIKI BERNINGER MRN: [id number] Date of Birth: 1945-12-16 Referring Provider:     Cardiac Rehab from 03/10/2020 in [facility] Cardiac and Pulmonary Rehab  Referring Provider  Fransico Him MD      Encounter Date: 04/10/2020  Check In: Session Check In - 04/10/20 1328      Check-In   Supervising physician immediately available to respond to emergencies  See telemetry face sheet for immediately available ER MD    Location  [facility]-Cardiac & Pulmonary Rehab    Staff Present  Renita Papa, RN Vickki Hearing, BA, ACSM CEP, Exercise Physiologist;Kara Eliezer Bottom, MS Exercise Physiologist;Laureen Owens Shark, BS, RRT, CPFT    Virtual Visit  No    Medication changes reported      No    Fall or balance concerns reported     No    Warm-up and Cool-down  Performed on first and last piece of equipment    Resistance Training Performed  Yes    VAD Patient?  No    PAD/SET Patient?  No      Pain Assessment   Currently in Pain?  No/denies          Social History   Tobacco Use  Smoking Status Never Smoker  Smokeless Tobacco Never Used    Goals Met:  Independence with exercise equipment Exercise tolerated well No report of cardiac concerns or symptoms Strength training completed today  Goals Unmet:  Not Applicable  Comments: Pt able to follow exercise prescription today without complaint.  Will continue to monitor for progression.    Dr. Emily Filbert is Medical Director for [place] and [facility].

## 2020-04-11 ENCOUNTER — Encounter: Payer: Self-pay | Admitting: Family Medicine

## 2020-04-13 ENCOUNTER — Other Ambulatory Visit: Payer: Self-pay | Admitting: Family Medicine

## 2020-04-13 DIAGNOSIS — I25119 Atherosclerotic heart disease of native coronary artery with unspecified angina pectoris: Secondary | ICD-10-CM

## 2020-04-14 ENCOUNTER — Other Ambulatory Visit: Payer: Self-pay

## 2020-04-14 DIAGNOSIS — Z955 Presence of coronary angioplasty implant and graft: Secondary | ICD-10-CM | POA: Diagnosis not present

## 2020-04-14 NOTE — Progress Notes (Signed)
Daily Session Note  Patient Details  Name: Gloria [name] MRN: [id number] Date of Birth: [birth year] Referring Provider:     Cardiac Rehab from 03/10/2020 in [facility] Cardiac and Pulmonary Rehab  Referring Provider  Fransico Him MD      Encounter Date: 04/14/2020  Check In: Session Check In - 04/14/20 1418      Check-In   Supervising physician immediately available to respond to emergencies  See telemetry face sheet for immediately available ER MD    Location  [facility]-Cardiac & Pulmonary Rehab    Staff Present  Basilia Jumbo, RN, Margurite Auerbach, MS Exercise Physiologist;Kelly Amedeo Plenty, BS, ACSM CEP, Exercise Physiologist;Melissa [place] RDN, LDN;Jessica [place], MA, RCEP, CCRP, CCET;Joseph [place]    Virtual Visit  No    Medication changes reported      No    Fall or balance concerns reported     No    Tobacco Cessation  No Change    Warm-up and Cool-down  Performed on first and last piece of equipment    Resistance Training Performed  Yes    VAD Patient?  No    PAD/SET Patient?  No      Pain Assessment   Currently in Pain?  No/denies          Social History   Tobacco Use  Smoking Status Never Smoker  Smokeless Tobacco Never Used    Goals Met:  Independence with exercise equipment Exercise tolerated well No report of cardiac concerns or symptoms  Goals Unmet:  Not Applicable  Comments: Pt able to follow exercise prescription today without complaint.  Will continue to monitor for progression.    Dr. Emily Filbert is Medical Director for [place] and [facility].

## 2020-04-16 ENCOUNTER — Encounter: Payer: Medicare PPO | Admitting: *Deleted

## 2020-04-16 ENCOUNTER — Other Ambulatory Visit: Payer: Self-pay

## 2020-04-16 DIAGNOSIS — Z955 Presence of coronary angioplasty implant and graft: Secondary | ICD-10-CM

## 2020-04-16 NOTE — Progress Notes (Signed)
Daily Session Note  Patient Details  Name: Gloria [name] MRN: [id number] Date of Birth: [birth year] Referring Provider:     Cardiac Rehab from 03/10/2020 in [facility] Cardiac and Pulmonary Rehab  Referring Provider  Fransico Him MD      Encounter Date: 04/16/2020  Check In: Session Check In - 04/16/20 1344      Check-In   Supervising physician immediately available to respond to emergencies  See telemetry face sheet for immediately available ER MD    Location  [facility]-Cardiac & Pulmonary Rehab    Staff Present  Heath Lark, RN, BSN, CCRP;Laureen Owens Shark, BS, RRT, CPFT;Melissa Caiola RDN, LDN    Virtual Visit  No    Medication changes reported      No    Fall or balance concerns reported     No    Warm-up and Cool-down  Performed on first and last piece of equipment    Resistance Training Performed  Yes    VAD Patient?  No    PAD/SET Patient?  No      Pain Assessment   Currently in Pain?  No/denies          Social History   Tobacco Use  Smoking Status Never Smoker  Smokeless Tobacco Never Used    Goals Met:  Independence with exercise equipment Exercise tolerated well No report of cardiac concerns or symptoms  Goals Unmet:  Not Applicable  Comments: Pt able to follow exercise prescription today without complaint.  Will continue to monitor for progression.    Dr. Emily Filbert is Medical Director for [place] and [facility].

## 2020-04-21 ENCOUNTER — Encounter: Payer: Medicare PPO | Admitting: *Deleted

## 2020-04-21 ENCOUNTER — Other Ambulatory Visit: Payer: Self-pay

## 2020-04-21 DIAGNOSIS — Z955 Presence of coronary angioplasty implant and graft: Secondary | ICD-10-CM | POA: Diagnosis not present

## 2020-04-21 NOTE — Progress Notes (Signed)
Daily Session Note  Patient Details  Name: Gloria [name] MRN: [id number] Date of Birth: 04-19-1946 Referring Provider:     Cardiac Rehab from 03/10/2020 in [facility] Cardiac and Pulmonary Rehab  Referring Provider Fransico Him MD      Encounter Date: 04/21/2020  Check In:  Session Check In - 04/21/20 1333      Check-In   Supervising physician immediately available to respond to emergencies See telemetry face sheet for immediately available ER MD    Location [facility]-Cardiac & Pulmonary Rehab    Staff Present Renita Papa, RN BSN;Joseph [address] [place], [place], ACSM CEP, Exercise Physiologist    Virtual Visit No    Medication changes reported     No    Fall or balance concerns reported    No    Warm-up and Cool-down Performed on first and last piece of equipment    Resistance Training Performed Yes    VAD Patient? No    PAD/SET Patient? No      Pain Assessment   Currently in Pain? No/denies              Social History   Tobacco Use  Smoking Status Never Smoker  Smokeless Tobacco Never Used    Goals Met:  Independence with exercise equipment Exercise tolerated well No report of cardiac concerns or symptoms Strength training completed today  Goals Unmet:  Not Applicable  Comments: Pt able to follow exercise prescription today without complaint.  Will continue to monitor for progression.    Dr. Emily Filbert is Medical Director for [place] and [facility].

## 2020-04-23 ENCOUNTER — Other Ambulatory Visit: Payer: Self-pay

## 2020-04-23 ENCOUNTER — Encounter: Payer: Self-pay | Admitting: *Deleted

## 2020-04-23 ENCOUNTER — Encounter: Payer: Medicare PPO | Admitting: *Deleted

## 2020-04-23 DIAGNOSIS — Z955 Presence of coronary angioplasty implant and graft: Secondary | ICD-10-CM

## 2020-04-23 NOTE — Progress Notes (Signed)
Daily Session Note  Patient Details  Name: Gloria [name] MRN: [id number] Date of Birth: 1946/01/04 Referring Provider:     Cardiac Rehab from 03/10/2020 in [facility]  Referring Provider Fransico Him MD      Encounter Date: 04/23/2020  Check In:  Session Check In - 04/23/20 1326      Check-In   Supervising physician immediately available to respond to emergencies See telemetry face sheet for immediately available ER MD    Location [facility] Pul[facility]    Staff Present Renita Papa, RN BSN;Melissa Caiola RDN, Rowe Pavy, BA, ACSM CEP, Exercise Physiologist    Virtual Visit No    Medication changes reported     No    Fall or balance concerns reported    No    Warm-up and Cool-down Performed on first and last piece of equipment    Resistance Training Performed Yes    VAD Patient? No    PAD/SET Patient? No      Pain Assessment   Currently in Pain? No/denies              Social History   Tobacco Use  Smoking Status Never Smoker  Smokeless Tobacco Never Used    Goals Met:  Independence with exercise equipment Exercise tolerated well No report of cardiac concerns or symptoms Strength training completed today  Goals Unmet:  Not Applicable  Comments: Pt able to follow exercise prescription today without complaint.  Will continue to monitor for progression.    Dr. Emily Filbert is Medical Director for [place] and [facility].

## 2020-04-23 NOTE — Progress Notes (Signed)
Cardiac Individual Treatment Plan  Patient Details  Name: Gloria [name] MRN: [id number] Date of Birth: Mar 22, 1946 Referring Provider:     Cardiac Rehab from 03/10/2020 in [facility] Cardiac and Pulmonary Rehab  Referring Provider Fransico Him MD      Initial Encounter Date:    Cardiac Rehab from 03/10/2020 in [facility] Cardiac and Pulmonary Rehab  Date 03/10/20      Visit Diagnosis: Status post coronary artery stent placement  Patient's Home Medications on Admission:  Current Outpatient Medications:  .  ACCU-CHEK AVIVA PLUS test strip, CHECK BLOOD SUGAR FOUR TIMES DAILY AS NEEDED, Disp: 100 each, Rfl: 3 .  ACCU-CHEK FASTCLIX LANCETS MISC, Use to check blood sugar 4 times daily as needed.  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  aspirin EC 81 MG tablet, Take 1 tablet (81 mg total) by mouth daily., Disp: 90 tablet, Rfl: 3 .  atorvastatin (LIPITOR) 10 MG tablet, Take 2 tablets (20 mg total) by mouth daily., Disp: , Rfl:  .  Blood Glucose Monitoring Suppl (ACCU-CHEK AVIVA PLUS) W/DEVICE KIT, Use to check blood sugar 4 times daily as needed.  Diagnosis:  E11.9  Insulin Dependent, Disp: 1 kit, Rfl: 0 .  Cholecalciferol (VITAMIN D) 50 MCG (2000 UT) CAPS, Take 2,000 Units by mouth daily. , Disp: , Rfl:  .  clopidogrel (PLAVIX) 75 MG tablet, Take 1 tablet (75 mg total) by mouth daily., Disp: 30 tablet, Rfl: 11 .  conjugated estrogens (PREMARIN) vaginal cream, Place vaginally., Disp: , Rfl:  .  famotidine (PEPCID) 20 MG tablet, TAKE ONE TABLET EVERY DAY, Disp: 30 tablet, Rfl: 0 .  hydrochlorothiazide (HYDRODIURIL) 25 MG tablet, TAKE ONE TABLET EVERY DAY, Disp: 90 tablet, Rfl: 2 .  insulin detemir (LEVEMIR FLEXTOUCH) 100 UNIT/ML FlexPen, INJECT 40 UNITS SUBCUTANEOUSLY AT BEDTIME, Disp: 90 mL, Rfl: 0 .  Insulin Pen Needle (B-D ULTRAFINE III SHORT PEN) 31G X 8 MM MISC, USE AS DIRECTED  ** DUE FOR OFFICE VISIT/PHYSICAL, Disp: 100 each, Rfl: 0 .  metFORMIN (GLUCOPHAGE) 1000 MG tablet,  TAKE 1 TABLET BY MOUTH TWICE DAILY WITH MEALS, Disp: 180 tablet, Rfl: 3 .  nitroGLYCERIN (NITROSTAT) 0.4 MG SL tablet, Place 1 tablet (0.4 mg total) under the tongue every 5 (five) minutes as needed., Disp: 25 tablet, Rfl: 3 .  Probiotic Product (ALIGN) 4 MG CAPS, Take 4 mg by mouth daily. , Disp: , Rfl:  .  Psyllium (METAMUCIL PO), Take 1 tablet by mouth daily as needed (Constipation). , Disp: , Rfl:  .  quinapril (ACCUPRIL) 20 MG tablet, TAKE ONE TABLET BY MOUTH TWICE DAILY, Disp: 180 tablet, Rfl: 2 .  vitamin C (ASCORBIC ACID) 500 MG tablet, Take 500 mg by mouth daily., Disp: , Rfl:   Past Medical History: Past Medical History:  Diagnosis Date  . ABDOMINAL PAIN OTHER SPECIFIED SITE 08/27/2010   Qualifier: Diagnosis of  By: Damita Dunnings MD, Phillip Heal    . Advance directive discussed with patient 01/03/2012   12/2011-AD d/w pt.  She has talked to husband but doesn't have living will yet.  Full code in discussion with patient today.  Would not want prolonged interventions if she were profoundly and/or permanently impaired, ie severe dementia or a condition with no hope of improvement.   01/13/15-Advance directive d/w pt- husband designated if patient were incapacitated.    . Allergic rhinitis   . Cardiac murmur 2011   with normal echo  . CARDIAC MURMUR 05/26/2010   Qualifier: Diagnosis of  By: Damita Dunnings MD, Phillip Heal    . Diabetes mellitus    Type II  . Diabetes mellitus without complication ([place]) 3/78/5885   Qualifier: Diagnosis of  By: Council Mechanic MD, Hilaria Ota   . Disorder of bone and cartilage 12/20/2007   2016 DXA: -1.4.  Consider repeat in 2021.     Marland Kitchen Essential hypertension, benign 03/30/2011  . Exertional shortness of breath 01/02/2020  . Female cystocele 08/10/2012  . Health care maintenance 02/09/2018  . Hemorrhoids   . Hyperlipidemia   . Hypertension   . Left shoulder pain 07/20/2016  . Medicare annual wellness visit, subsequent 01/03/2012  . MENOPAUSE, SURGICAL 12/16/2009   Qualifier: Diagnosis of   By: Lurlean Nanny LPN, Regina    . Pure hypercholesterolemia 03/30/2011  . RENAL CALCULUS 05/24/2007   Qualifier: Diagnosis of  By: Council Mechanic MD, Hilaria Ota   . RHINITIS 12/24/2010   Qualifier: Diagnosis of  By: Damita Dunnings MD, Phillip Heal    . Snoring 01/02/2020  . Ureteral stone with hydronephrosis 08/02/2012    Tobacco Use: Social History   Tobacco Use  Smoking Status Never Smoker  Smokeless Tobacco Never Used    Labs: Recent Review Flowsheet Data    Labs for ITP Cardiac and Pulmonary Rehab Latest Ref Rng & Units 02/02/2018 06/29/2019 10/01/2019 01/01/2020 03/31/2020   Cholestrol 0 - 200 mg/dL 125 132 - 119 128   LDLCALC 0 - 99 mg/dL 79 85 - 74 81   HDL >39.00 mg/dL 34.00(L) 33.20(L) - 32.70(L) 31.30(L)   Trlycerides 0 - 149 mg/dL 58.0 70.0 - 61.0 77.0   Hemoglobin A1c 4.0 - 5.6 % 7.9(H) 8.1(H) 7.5(A) 7.4(A) 7.0(A)       Exercise Target Goals: Exercise Program Goal: Individual exercise prescription set using results from initial 6 min walk test and THRR while considering  patient's activity barriers and safety.   Exercise Prescription Goal: Initial exercise prescription builds to 30-45 minutes a day of aerobic activity, 2-3 days per week.  Home exercise guidelines will be given to patient during program as part of exercise prescription that the participant will acknowledge.   Education: Aerobic Exercise & Resistance Training: - Gives group verbal and written instruction on the various components of exercise. Focuses on aerobic and resistive training programs and the benefits of this training and how to safely progress through these programs..   Education: Exercise & Equipment Safety: - Individual verbal instruction and demonstration of equipment use and safety with use of the equipment.   Cardiac Rehab from 03/10/2020 in [facility] Cardiac and Pulmonary Rehab  Date 03/06/20  Educator [facility]  Instruction Review Code 1- Verbalizes Understanding      Education: Exercise Physiology & General Exercise  Guidelines: - Group verbal and written instruction with models to review the exercise physiology of the cardiovascular system and associated critical values. Provides general exercise guidelines with specific guidelines to those with heart or lung disease.    Education: Flexibility, Balance, Mind/Body Relaxation: Provides group verbal/written instruction on the benefits of flexibility and balance training, including mind/body exercise modes such as yoga, pilates and tai chi.  Demonstration and skill practice provided.   Activity Barriers & Risk Stratification:  Activity Barriers & Cardiac Risk Stratification - 03/10/20 1317      Activity Barriers & Cardiac Risk Stratification   Activity Barriers Arthritis;Joint Problems;Deconditioning;Shortness of Breath;Chest Pain/Angina;Balance Concerns   occasional shoulder and knee pain, limited ROM in shoulders especially overhead   Cardiac Risk Stratification Moderate           6  Minute Walk:  6 Minute Walk    Row Name 03/10/20 1316         6 Minute Walk   Phase Initial     Distance 1266 feet     Walk Time 6 minutes     # of Rest Breaks 0     MPH 2.4     METS 2.68     RPE 9     VO2 Peak 9.39     Symptoms No     Resting HR 72 bpm     Resting BP 124/62     Resting Oxygen Saturation  96 %     Exercise Oxygen Saturation  during 6 min walk 96 %     Max Ex. HR 102 bpm     Max Ex. BP 128/64     2 Minute Post BP 116/64            Oxygen Initial Assessment:   Oxygen Re-Evaluation:   Oxygen Discharge (Final Oxygen Re-Evaluation):   Initial Exercise Prescription:  Initial Exercise Prescription - 03/10/20 1300      Date of Initial Exercise RX and Referring Provider   Date 03/10/20    Referring Provider Fransico Him MD      Treadmill   MPH 2    Grade 0.5    Minutes 15    METs 2.81      NuStep   Level 2    SPM 80    Minutes 15    METs 2.5      REL-XR   Level 1    Speed 50    Minutes 15    METs 2.5       Biostep-RELP   Level 2    SPM 80    Minutes 15    METs 2      Prescription Details   Frequency (times per week) 3    Duration Progress to 30 minutes of continuous aerobic without signs/symptoms of physical distress      Intensity   THRR 40-80% of Max Heartrate 102-131    Ratings of Perceived Exertion 11-13    Perceived Dyspnea 0-4      Progression   Progression Continue to progress workloads to maintain intensity without signs/symptoms of physical distress.      Resistance Training   Training Prescription Yes    Weight 4 lb    Reps 10-15           Perform Capillary Blood Glucose checks as needed.  Exercise Prescription Changes:  Exercise Prescription Changes    Row Name 03/10/20 1300 03/17/20 1600 04/01/20 1600 04/17/20 1300       Response to Exercise   Blood Pressure (Admit) 124/62 126/62 124/64 118/60    Blood Pressure (Exercise) 128/64 170/70 140/70 146/68    Blood Pressure (Exit) 116/64 124/64 122/64 122/64    Heart Rate (Admit) 72 bpm 76 bpm 99 bpm 87 bpm    Heart Rate (Exercise) 102 bpm 113 bpm 112 bpm 104 bpm    Heart Rate (Exit) 73 bpm 87 bpm 87 bpm 82 bpm    Oxygen Saturation (Admit) 96 % -- -- --    Oxygen Saturation (Exercise) 96 % -- -- --    Rating of Perceived Exertion (Exercise) _0 Symptoms none none none none    Comments walk test results -- -- --    Duration -- Progress to 30 minutes of  aerobic without signs/symptoms of physical distress  Continue with 30 min of aerobic exercise without signs/symptoms of physical distress. Continue with 30 min of aerobic exercise without signs/symptoms of physical distress.    Intensity -- THRR unchanged THRR unchanged THRR unchanged      Progression   Progression -- Continue to progress workloads to maintain intensity without signs/symptoms of physical distress. Continue to progress workloads to maintain intensity without signs/symptoms of physical distress. Continue to progress workloads to maintain  intensity without signs/symptoms of physical distress.    Average METs -- 3 3.45 2.68      Resistance Training   Training Prescription -- Yes Yes Yes    Weight -- 4 lb 4 lb 4 lb    Reps -- 10-15 10-15 10-15      Interval Training   Interval Training -- -- No No      Treadmill   MPH -- -- 2.3 2.5    Grade -- -- 1 1    Minutes -- -- 15 15    METs -- -- 2.67 3.26      NuStep   Level -- _0 SPM -- 80 -- --    Minutes -- _1 METs -- 3 2.5 2.1      REL-XR   Level -- -- 3 3    Minutes -- -- 15 15    METs -- -- 4.2 --      Biostep-RELP   Level -- 2 3 --    SPM -- 80 -- --    Minutes -- 15 15 --    METs -- 3 4 --      Home Exercise Plan   Plans to continue exercise at -- -- Home (comment)  walking Home (comment)  walking    Frequency -- -- Add 2 additional days to program exercise sessions. Add 2 additional days to program exercise sessions.    Initial Home Exercises Provided -- -- 03/26/20 03/26/20           Exercise Comments:   Exercise Goals and Review:  Exercise Goals    Row Name 03/10/20 1326             Exercise Goals   Increase Physical Activity Yes       Intervention Provide advice, education, support and counseling about physical activity/exercise needs.;Develop an individualized exercise prescription for aerobic and resistive training based on initial evaluation findings, risk stratification, comorbidities and participant's personal goals.       Expected Outcomes Short Term: Attend rehab on a regular basis to increase amount of physical activity.;Long Term: Add in home exercise to make exercise part of routine and to increase amount of physical activity.;Long Term: Exercising regularly at least 3-5 days a week.       Increase Strength and Stamina Yes       Intervention Provide advice, education, support and counseling about physical activity/exercise needs.;Develop an individualized exercise prescription for aerobic and resistive training based  on initial evaluation findings, risk stratification, comorbidities and participant's personal goals.       Expected Outcomes Short Term: Increase workloads from initial exercise prescription for resistance, speed, and METs.;Long Term: Improve cardiorespiratory fitness, muscular endurance and strength as measured by increased METs and functional capacity (6MWT);Short Term: Perform resistance training exercises routinely during rehab and add in resistance training at home       Able to understand and use rate of perceived exertion (RPE) scale Yes  Intervention Provide education and explanation on how to use RPE scale       Expected Outcomes Short Term: Able to use RPE daily in rehab to express subjective intensity level;Long Term:  Able to use RPE to guide intensity level when exercising independently       Able to understand and use Dyspnea scale Yes       Intervention Provide education and explanation on how to use Dyspnea scale       Expected Outcomes Short Term: Able to use Dyspnea scale daily in rehab to express subjective sense of shortness of breath during exertion;Long Term: Able to use Dyspnea scale to guide intensity level when exercising independently       Knowledge and understanding of Target Heart Rate Range (THRR) Yes       Intervention Provide education and explanation of THRR including how the numbers were predicted and where they are located for reference       Expected Outcomes Short Term: Able to state/look up THRR;Short Term: Able to use daily as guideline for intensity in rehab;Long Term: Able to use THRR to govern intensity when exercising independently       Able to check pulse independently Yes       Intervention Provide education and demonstration on how to check pulse in carotid and radial arteries.;Review the importance of being able to check your own pulse for safety during independent exercise       Expected Outcomes Short Term: Able to explain why pulse checking is  important during independent exercise;Long Term: Able to check pulse independently and accurately       Understanding of Exercise Prescription Yes       Intervention Provide education, explanation, and written materials on patient's individual exercise prescription       Expected Outcomes Short Term: Able to explain program exercise prescription;Long Term: Able to explain home exercise prescription to exercise independently              Exercise Goals Re-Evaluation :  Exercise Goals Re-Evaluation    Row Name 03/13/20 1349 04/01/20 1559 04/04/20 0752 04/17/20 1319       Exercise Goal Re-Evaluation   Exercise Goals Review Increase Physical Activity;Able to understand and use rate of perceived exertion (RPE) scale;Knowledge and understanding of Target Heart Rate Range (THRR);Understanding of Exercise Prescription;Increase Strength and Stamina;Able to check pulse independently Increase Physical Activity;Increase Strength and Stamina;Understanding of Exercise Prescription Increase Physical Activity;Increase Strength and Stamina;Understanding of Exercise Prescription Increase Physical Activity;Increase Strength and Stamina;Understanding of Exercise Prescription    Comments Reviewed RPE and dyspnea scales, THR and program prescription with pt today.  Pt voiced understanding and was given a copy of goals to take home. Monaye is doing well in rehab.  She is already up to level 3 on the XR and 4 METs on the BioStep.  We will continue to monitor his progression. Oyinkansola is doing well in rehab.  She is now walking twice a week on her off days for a 1 mile.  She is now up to 23 min without getting SOB.  She can tell that her stamina is starting to improve. Vannia is doing well in rehab. She is uo to level 3 on the XR and using 4 lb weights.  We will continue to monitor her progress.    Expected Outcomes Short: Use RPE daily to regulate intensity. Long: Follow program prescription in THR. Short: Add more incline to  treadmill Long: Continue to improve stamina. Short:  Continue to add in walking on off days  Long: Continue to improve stamina. Short: Increase NuStep Long: Continue to improve stamina.           Discharge Exercise Prescription (Final Exercise Prescription Changes):  Exercise Prescription Changes - 04/17/20 1300      Response to Exercise   Blood Pressure (Admit) 118/60    Blood Pressure (Exercise) 146/68    Blood Pressure (Exit) 122/64    Heart Rate (Admit) 87 bpm    Heart Rate (Exercise) 104 bpm    Heart Rate (Exit) 82 bpm    Rating of Perceived Exertion (Exercise) 13    Symptoms none    Duration Continue with 30 min of aerobic exercise without signs/symptoms of physical distress.    Intensity THRR unchanged      Progression   Progression Continue to progress workloads to maintain intensity without signs/symptoms of physical distress.    Average METs 2.68      Resistance Training   Training Prescription Yes    Weight 4 lb    Reps 10-15      Interval Training   Interval Training No      Treadmill   MPH 2.5    Grade 1    Minutes 15    METs 3.26      NuStep   Level 3    Minutes 15    METs 2.1      REL-XR   Level 3    Minutes 15      Home Exercise Plan   Plans to continue exercise at Home (comment)   walking   Frequency Add 2 additional days to program exercise sessions.    Initial Home Exercises Provided 03/26/20           Nutrition:  Target Goals: Understanding of nutrition guidelines, daily intake of sodium <1516m, cholesterol <2069m calories 30% from fat and 7% or less from saturated fats, daily to have 5 or more servings of fruits and vegetables.  Education: Controlling Sodium/Reading Food Labels -Group verbal and written material supporting the discussion of sodium use in heart healthy nutrition. Review and explanation with models, verbal and written materials for utilization of the food label.   Education: General Nutrition Guidelines/Fats and  Fiber: -Group instruction provided by verbal, written material, models and posters to present the general guidelines for heart healthy nutrition. Gives an explanation and review of dietary fats and fiber.   Biometrics:  Pre Biometrics - 03/10/20 1326      Pre Biometrics   Height 5' 4.6" (1.641 m)    Weight 151 lb 1.6 oz (68.5 kg)    BMI (Calculated) 25.45    Single Leg Stand 7.78 seconds            Nutrition Therapy Plan and Nutrition Goals:  Nutrition Therapy & Goals - 04/14/20 1428      Nutrition Therapy   Diet Low Na, heart healthy, diabetes friendly    Protein (specify units) 50-55g    Fiber 25 grams    Whole Grain Foods 3 servings    Saturated Fats 12 max. grams    Fruits and Vegetables 5 servings/day    Sodium 1.5 grams      Personal Nutrition Goals   Nutrition Goal ST: add protein and fat to breakfast - nuts or nut butter (fruit salad, yogurt) LT: increase stamina and increase energy (has half the energy she used to, gets fatigued very easily)    Comments Pt has recently changed her  diet after heart event - more fish, more chicken, less salt, less sugar, less saturated fat, salads, etc. Pt is including healthy fats like canola oil and olive oil. Discussed MyPlate heart healthy eating, diabetes friendly eating.      Intervention Plan   Intervention Prescribe, educate and counsel regarding individualized specific dietary modifications aiming towards targeted core components such as weight, hypertension, lipid management, diabetes, heart failure and other comorbidities.;Nutrition handout(s) given to patient.    Expected Outcomes Long Term Goal: Adherence to prescribed nutrition plan.;Short Term Goal: A plan has been developed with personal nutrition goals set during dietitian appointment.;Short Term Goal: Understand basic principles of dietary content, such as calories, fat, sodium, cholesterol and nutrients.           Nutrition Assessments:  Nutrition Assessments -  03/10/20 1327      MEDFICTS Scores   Pre Score 20           MEDIFICTS Score Key:          ?70 Need to make dietary changes          40-70 Heart Healthy Diet         ? 40 Therapeutic Level Cholesterol Diet  Nutrition Goals Re-Evaluation:   Nutrition Goals Discharge (Final Nutrition Goals Re-Evaluation):   Psychosocial: Target Goals: Acknowledge presence or absence of significant depression and/or stress, maximize coping skills, provide positive support system. Participant is able to verbalize types and ability to use techniques and skills needed for reducing stress and depression.   Education: Depression - Provides group verbal and written instruction on the correlation between heart/lung disease and depressed mood, treatment options, and the stigmas associated with seeking treatment.   Education: Sleep Hygiene -Provides group verbal and written instruction about how sleep can affect your health.  Define sleep hygiene, discuss sleep cycles and impact of sleep habits. Review good sleep hygiene tips.     Education: Stress and Anxiety: - Provides group verbal and written instruction about the health risks of elevated stress and causes of high stress.  Discuss the correlation between heart/lung disease and anxiety and treatment options. Review healthy ways to manage with stress and anxiety.    Initial Review & Psychosocial Screening:  Initial Psych Review & Screening - 03/06/20 1034      Initial Review   Current issues with Current Sleep Concerns      Family Dynamics   Good Support System? Yes    Comments She states that she has moderate sleep apnea. She has to go back to the dentist to get a mouthpiece to help with her Apnea. She can look to her husband, daughter and son for support.      Barriers   Psychosocial barriers to participate in program The patient should benefit from training in stress management and relaxation.      Screening Interventions   Interventions  Encouraged to exercise;Provide feedback about the scores to participant;To provide support and resources with identified psychosocial needs    Expected Outcomes Short Term goal: Utilizing psychosocial counselor, staff and physician to assist with identification of specific Stressors or current issues interfering with healing process. Setting desired goal for each stressor or current issue identified.;Long Term Goal: Stressors or current issues are controlled or eliminated.;Short Term goal: Identification and review with participant of any Quality of Life or Depression concerns found by scoring the questionnaire.;Long Term goal: The participant improves quality of Life and PHQ9 Scores as seen by post scores and/or verbalization of changes  Quality of Life Scores:   Quality of Life - 03/10/20 1327      Quality of Life   Select Quality of Life      Quality of Life Scores   Health/Function Pre 21.43 %    Socioeconomic Pre 24.07 %    Psych/Spiritual Pre 23.14 %    Family Pre 28.8 %    GLOBAL Pre 23.41 %          Scores of 19 and below usually indicate a poorer quality of life in these areas.  A difference of  2-3 points is a clinically meaningful difference.  A difference of 2-3 points in the total score of the Quality of Life Index has been associated with significant improvement in overall quality of life, self-image, physical symptoms, and general health in studies assessing change in quality of life.  PHQ-9: Recent Review Flowsheet Data    Depression screen [facility] 2/9 03/10/2020 06/29/2019 02/02/2018 01/25/2017 01/15/2016   Decreased Interest 0 0 0 0 0   Down, Depressed, Hopeless 0 0 0 0 0   PHQ - 2 Score 0 0 0 0 0   Altered sleeping 2 0 0 - -   Tired, decreased energy 2 0 0 - -   Change in appetite 0 0 0 - -   Feeling bad or failure about yourself  0 0 0 - -   Trouble concentrating 0 0 0 - -   Moving slowly or fidgety/restless 0 0 0 - -   Suicidal thoughts 0 0 0 - -   PHQ-9 Score  4 0 0 - -   Difficult doing work/chores Somewhat difficult - Not difficult at all - -     Interpretation of Total Score  Total Score Depression Severity:  1-4 = Minimal depression, 5-9 = Mild depression, 10-14 = Moderate depression, 15-19 = Moderately severe depression, 20-27 = Severe depression   Psychosocial Evaluation and Intervention:  Psychosocial Evaluation - 03/06/20 1038      Psychosocial Evaluation & Interventions   Interventions Encouraged to exercise with the program and follow exercise prescription    Comments She states that she has moderate sleep apnea. She has to go back to the dentist to get a mouthpiece to help with her Apnea. She can look to her husband, daughter and son for support.    Expected Outcomes Short: Attend HeartTrack stress management education to decrease stress. Long: Maintain exercise Post HeartTrack to keep stress at a minimum.    Continue Psychosocial Services  Follow up required by staff           Psychosocial Re-Evaluation:  Psychosocial Re-Evaluation    Turner Name 04/04/20 [phone]             Psychosocial Re-Evaluation   Current issues with Current Sleep Concerns       Comments Overall, Eden is doing well mentally.  She denies having any major stressors in her life.  Her biggest problem contnues to be her sleep.  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  She is trying a new pillow which helps some.       Expected Outcomes Short: Continue to find comfortable sleeping positions  Long: Continue stay positive.       Interventions Encouraged to attend Cardiac Rehabilitation for the exercise       Continue Psychosocial Services  Follow up required by staff  Psychosocial Discharge (Final Psychosocial Re-Evaluation):  Psychosocial Re-Evaluation - 04/04/20 0753      Psychosocial Re-Evaluation   Current issues with Current Sleep Concerns    Comments Overall, Sharisse is doing  well mentally.  She denies having any major stressors in her life.  Her biggest problem contnues to be her sleep.  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  She is trying a new pillow which helps some.    Expected Outcomes Short: Continue to find comfortable sleeping positions  Long: Continue stay positive.    Interventions Encouraged to attend Cardiac Rehabilitation for the exercise    Continue Psychosocial Services  Follow up required by staff           Vocational Rehabilitation: Provide vocational rehab assistance to qualifying candidates.   Vocational Rehab Evaluation & Intervention:   Education: Education Goals: Education classes will be provided on a variety of topics geared toward better understanding of heart health and risk factor modification. Participant will state understanding/return demonstration of topics presented as noted by education test scores.  Learning Barriers/Preferences:  Learning Barriers/Preferences - 03/06/20 1036      Learning Barriers/Preferences   Learning Barriers Sight    Learning Preferences None           General Cardiac Education Topics:  AED/CPR: - Group verbal and written instruction with the use of models to demonstrate the basic use of the AED with the basic ABC's of resuscitation.   Anatomy & Physiology of the Heart: - Group verbal and written instruction and models provide basic cardiac anatomy and physiology, with the coronary electrical and arterial systems. Review of Valvular disease and Heart Failure   Cardiac Procedures: - Group verbal and written instruction to review commonly prescribed medications for heart disease. Reviews the medication, class of the drug, and side effects. Includes the steps to properly store meds and maintain the prescription regimen. (beta blockers and nitrates)   Cardiac Medications I: - Group verbal and written instruction to review  commonly prescribed medications for heart disease. Reviews the medication, class of the drug, and side effects. Includes the steps to properly store meds and maintain the prescription regimen.   Cardiac Medications II: -Group verbal and written instruction to review commonly prescribed medications for heart disease. Reviews the medication, class of the drug, and side effects. (all other drug classes)    Go Sex-Intimacy & Heart Disease, Get SMART - Goal Setting: - Group verbal and written instruction through game format to discuss heart disease and the return to sexual intimacy. Provides group verbal and written material to discuss and apply goal setting through the application of the S.M.A.R.T. Method.   Other Matters of the Heart: - Provides group verbal, written materials and models to describe Stable Angina and Peripheral Artery. Includes description of the disease process and treatment options available to the cardiac patient.   Infection Prevention: - Provides verbal and written material to individual with discussion of infection control including proper hand washing and proper equipment cleaning during exercise session.   Cardiac Rehab from 03/10/2020 in [facility] Cardiac and Pulmonary Rehab  Date 03/06/20  Educator [facility]  Instruction Review Code 1- Verbalizes Understanding      Falls Prevention: - Provides verbal and written material to individual with discussion of falls prevention and safety.   Cardiac Rehab from 03/10/2020 in [facility] Cardiac and Pulmonary Rehab  Date 03/06/20  Educator [facility]  Instruction Review Code  1- Verbalizes Understanding      Other: -Provides group and verbal instruction on various topics (see comments)   Knowledge Questionnaire Score:  Knowledge Questionnaire Score - 03/10/20 1327      Knowledge Questionnaire Score   Pre Score 24/26 Education Focus: Exercise and Nutriton           Core Components/Risk Factors/Patient Goals at Admission:  Personal Goals  and Risk Factors at Admission - 03/10/20 1328      Core Components/Risk Factors/Patient Goals on Admission    Weight Management Yes;Weight Maintenance    Intervention Weight Management: Develop a combined nutrition and exercise program designed to reach desired caloric intake, while maintaining appropriate intake of nutrient and fiber, sodium and fats, and appropriate energy expenditure required for the weight goal.;Weight Management: Provide education and appropriate resources to help participant work on and attain dietary goals.    Admit Weight 151 lb 1.6 oz (68.5 kg)    Goal Weight: Short Term 145 lb (65.8 kg)    Goal Weight: Long Term 140 lb (63.5 kg)    Expected Outcomes Short Term: Continue to assess and modify interventions until short term weight is achieved;Weight Loss: Understanding of general recommendations for a balanced deficit meal plan, which promotes 1-2 lb weight loss per week and includes a negative energy balance of [phone] kcal/d;Understanding recommendations for meals to include 15-35% energy as protein, 25-35% energy from fat, 35-60% energy from carbohydrates, less than 259m of dietary cholesterol, 20-35 gm of total fiber daily;Understanding of distribution of calorie intake throughout the day with the consumption of 4-5 meals/snacks;Long Term: Adherence to nutrition and physical activity/exercise program aimed toward attainment of established weight goal    Diabetes Yes    Intervention Provide education about signs/symptoms and action to take for hypo/hyperglycemia.;Provide education about proper nutrition, including hydration, and aerobic/resistive exercise prescription along with prescribed medications to achieve blood glucose in normal ranges: Fasting glucose 65-99 mg/dL    Expected Outcomes Short Term: Participant verbalizes understanding of the signs/symptoms and immediate care of hyper/hypoglycemia, proper foot care and importance of medication, aerobic/resistive exercise  and nutrition plan for blood glucose control.;Long Term: Attainment of HbA1C < 7%.    Lipids Yes    Intervention Provide education and support for participant on nutrition & aerobic/resistive exercise along with prescribed medications to achieve LDL <753m HDL >4069m   Expected Outcomes Short Term: Participant states understanding of desired cholesterol values and is compliant with medications prescribed. Participant is following exercise prescription and nutrition guidelines.;Long Term: Cholesterol controlled with medications as prescribed, with individualized exercise RX and with personalized nutrition plan. Value goals: LDL < 51m24mDL > 40 mg.           Education:Diabetes - Individual verbal and written instruction to review signs/symptoms of diabetes, desired ranges of glucose level fasting, after meals and with exercise. Acknowledge that pre and post exercise glucose checks will be done for 3 sessions at entry of program.   [facility]  Date 03/06/20  [facility]- Verbalizes [facility]      Education: Know Your Numbers and Risk Factors: -Group verbal and written instruction about important numbers in your health.  Discussion of what are risk factors and how they play a role in the disease process.  Review of Cholesterol, Blood Pressure, Diabetes, and BMI and the role they play in your overall health.   Core Components/Risk Factors/Patient Goals Review:   Goals and  Risk Factor Review    Row Name 04/04/20 0845             Core Components/Risk Factors/Patient Goals Review   Personal Goals Review Weight Management/Obesity;Hypertension;Lipids       Review Rebel is doing well in rehab.  Her weight goes up and down daily, but overall is staying under 150 lb now! She is pleased with her progress. Her blood pressures have been doing well and she is checking them at home as well.  She had her lipids checked  this week and they are improving, but she is hoping for more improvement with exercise versus increased dose.       Expected Outcomes Short: Continue to exercise regularly Long: Continue to monitor risk factors.              Core Components/Risk Factors/Patient Goals at Discharge (Final Review):   Goals and Risk Factor Review - 04/04/20 0845      Core Components/Risk Factors/Patient Goals Review   Personal Goals Review Weight Management/Obesity;Hypertension;Lipids    Review Arlys is doing well in rehab.  Her weight goes up and down daily, but overall is staying under 150 lb now! She is pleased with her progress. Her blood pressures have been doing well and she is checking them at home as well.  She had her lipids checked this week and they are improving, but she is hoping for more improvement with exercise versus increased dose.    Expected Outcomes Short: Continue to exercise regularly Long: Continue to monitor risk factors.           ITP Comments:  ITP Comments    Row Name 03/06/20 1040 03/10/20 1315 03/13/20 1349 03/26/20 0613 04/23/20 0555   ITP Comments Virtual Orientation performed. Patient informed when to come in for RD and EP orientation. Diagnosis can be found in [facility] 02/26/2020. Completed 6MWT and gym orientation.  Initial ITP created and sent for review to Dr. Emily Filbert, Medical Director. First full day of exercise!  Patient was oriented to gym and equipment including functions, settings, policies, and procedures.  Patient's individual exercise prescription and treatment plan were reviewed.  All starting workloads were established based on the results of the 6 minute walk test done at initial orientation visit.  The plan for exercise progression was also introduced and progression will be customized based on patient's performance and goals 30 Day review completed. ITP review done, changes made as directed,and approval shown by signature of  Scientist, research (life sciences). 30 Day review  completed. Medical Director ITP review done, changes made as directed, and signed approval by Medical Director.          Comments: 30 Day review completed. Medical Director ITP review done, changes made as directed, and signed approval by Medical Director.

## 2020-04-24 ENCOUNTER — Other Ambulatory Visit: Payer: Self-pay

## 2020-04-24 ENCOUNTER — Encounter: Payer: Medicare PPO | Admitting: *Deleted

## 2020-04-24 DIAGNOSIS — Z955 Presence of coronary angioplasty implant and graft: Secondary | ICD-10-CM | POA: Diagnosis not present

## 2020-04-24 NOTE — Progress Notes (Signed)
Daily Session Note  Patient Details  Name: STEFHANIE KACHMAR MRN: [id number] Date of Birth: [birth year] Referring Provider:     Cardiac Rehab from 03/10/2020 in [facility] Cardiac and Pulmonary Rehab  Referring Provider Fransico Him MD      Encounter Date: 04/24/2020  Check In:  Session Check In - 04/24/20 1328      Check-In   Supervising physician immediately available to respond to emergencies See telemetry face sheet for immediately available ER MD    Location [facility]-Cardiac & Pulmonary Rehab    Staff Present Renita Papa, RN BSN;Joseph [address] [place], [place], [place], CCRP, CCET    Virtual Visit No    Medication changes reported     No    Fall or balance concerns reported    No    Warm-up and Cool-down Performed on first and last piece of equipment    Resistance Training Performed Yes    VAD Patient? No    PAD/SET Patient? No      Pain Assessment   Currently in Pain? No/denies              Social History   Tobacco Use  Smoking Status Never Smoker  Smokeless Tobacco Never Used    Goals Met:  Independence with exercise equipment Exercise tolerated well No report of cardiac concerns or symptoms Strength training completed today  Goals Unmet:  Not Applicable  Comments: Pt able to follow exercise prescription today without complaint.  Will continue to monitor for progression.    Dr. Emily Filbert is Medical Director for [place] and [facility].

## 2020-04-28 ENCOUNTER — Encounter: Payer: Medicare PPO | Admitting: *Deleted

## 2020-04-28 ENCOUNTER — Other Ambulatory Visit: Payer: Self-pay

## 2020-04-28 DIAGNOSIS — Z955 Presence of coronary angioplasty implant and graft: Secondary | ICD-10-CM | POA: Diagnosis not present

## 2020-04-28 DIAGNOSIS — G4733 Obstructive sleep apnea (adult) (pediatric): Secondary | ICD-10-CM | POA: Diagnosis not present

## 2020-04-28 NOTE — Progress Notes (Signed)
Daily Session Note  Patient Details  Name: Gloria [name] MRN: [id number] Date of Birth: September 22, 1946 Referring Provider:     Cardiac Rehab from 03/10/2020 in [facility] Cardiac and Pulmonary Rehab  Referring Provider Fransico Him MD      Encounter Date: 04/28/2020  Check In:  Session Check In - 04/28/20 1326      Check-In   Supervising physician immediately available to respond to emergencies See telemetry face sheet for immediately available ER MD    Location [facility]-Cardiac & Pulmonary Rehab    Staff Present Renita Papa, RN BSN;Joseph Hood RCP,RRT,BSRT;Melissa Wenonah RDN, LDN    Virtual Visit No    Medication changes reported     No    Fall or balance concerns reported    No    Warm-up and Cool-down Performed on first and last piece of equipment    Resistance Training Performed Yes    VAD Patient? No    PAD/SET Patient? No      Pain Assessment   Currently in Pain? No/denies              Social History   Tobacco Use  Smoking Status Never Smoker  Smokeless Tobacco Never Used    Goals Met:  Independence with exercise equipment Exercise tolerated well No report of cardiac concerns or symptoms Strength training completed today  Goals Unmet:  Not Applicable  Comments: Pt able to follow exercise prescription today without complaint.  Will continue to monitor for progression.    Dr. Emily Filbert is Medical Director for [place] and [facility].

## 2020-04-30 ENCOUNTER — Encounter: Payer: Medicare PPO | Admitting: *Deleted

## 2020-04-30 ENCOUNTER — Other Ambulatory Visit: Payer: Self-pay

## 2020-04-30 DIAGNOSIS — M19012 Primary osteoarthritis, left shoulder: Secondary | ICD-10-CM | POA: Diagnosis not present

## 2020-04-30 DIAGNOSIS — Z955 Presence of coronary angioplasty implant and graft: Secondary | ICD-10-CM

## 2020-04-30 DIAGNOSIS — M19011 Primary osteoarthritis, right shoulder: Secondary | ICD-10-CM | POA: Diagnosis not present

## 2020-04-30 NOTE — Progress Notes (Signed)
Daily Session Note  Patient Details  Name: Gloria [name] MRN: [id number] Date of Birth: [birth year] Referring Provider:     Cardiac Rehab from 03/10/2020 in [facility] Cardiac and Pulmonary Rehab  Referring Provider Fransico Him MD      Encounter Date: 04/30/2020  Check In:  Session Check In - 04/30/20 1329      Check-In   Supervising physician immediately available to respond to emergencies See telemetry face sheet for immediately available ER MD    Location [facility]-Cardiac & Pulmonary Rehab    Staff Present Renita Papa, RN BSN;Melissa Caiola RDN, Rowe Pavy, BA, ACSM CEP, Exercise Physiologist;Jessica Luan Pulling, MA, RCEP, CCRP, CCET    Virtual Visit No    Medication changes reported     No    Fall or balance concerns reported    No    Warm-up and Cool-down Performed on first and last piece of equipment    Resistance Training Performed Yes    VAD Patient? No    PAD/SET Patient? No      Pain Assessment   Currently in Pain? No/denies              Social History   Tobacco Use  Smoking Status Never Smoker  Smokeless Tobacco Never Used    Goals Met:  Independence with exercise equipment Exercise tolerated well No report of cardiac concerns or symptoms Strength training completed today  Goals Unmet:  Not Applicable  Comments: Pt able to follow exercise prescription today without complaint.  Will continue to monitor for progression.    Dr. Emily Filbert is Medical Director for [place] and [facility].

## 2020-05-01 ENCOUNTER — Encounter: Payer: Medicare PPO | Admitting: *Deleted

## 2020-05-01 ENCOUNTER — Other Ambulatory Visit: Payer: Self-pay

## 2020-05-01 DIAGNOSIS — Z955 Presence of coronary angioplasty implant and graft: Secondary | ICD-10-CM

## 2020-05-01 NOTE — Progress Notes (Signed)
Daily Session Note  Patient Details  Name: Gloria [name] MRN: [id number] Date of Birth: [birth year] Referring Provider:     Cardiac Rehab from 03/10/2020 in [facility] Cardiac and Pulmonary Rehab  Referring Provider Fransico Him MD      Encounter Date: 05/01/2020  Check In:  Session Check In - 05/01/20 1330      Check-In   Supervising physician immediately available to respond to emergencies See telemetry face sheet for immediately available ER MD    Location [facility]-Cardiac & Pulmonary Rehab    Staff Present Renita Papa, RN BSN;Joseph [address] [place], [place], [place], CCRP, CCET    Virtual Visit No    Medication changes reported     No    Fall or balance concerns reported    No    Warm-up and Cool-down Performed on first and last piece of equipment    Resistance Training Performed Yes    VAD Patient? No    PAD/SET Patient? No      Pain Assessment   Currently in Pain? No/denies              Social History   Tobacco Use  Smoking Status Never Smoker  Smokeless Tobacco Never Used    Goals Met:  Independence with exercise equipment Exercise tolerated well No report of cardiac concerns or symptoms Strength training completed today  Goals Unmet:  Not Applicable  Comments: Pt able to follow exercise prescription today without complaint.  Will continue to monitor for progression.    Dr. Emily Filbert is Medical Director for [place] and [facility].

## 2020-05-05 ENCOUNTER — Other Ambulatory Visit: Payer: Self-pay

## 2020-05-05 ENCOUNTER — Encounter: Payer: Medicare PPO | Admitting: *Deleted

## 2020-05-05 DIAGNOSIS — Z955 Presence of coronary angioplasty implant and graft: Secondary | ICD-10-CM

## 2020-05-05 NOTE — Progress Notes (Signed)
Daily Session Note  Patient Details  Name: Gloria [name] MRN: [id number] Date of Birth: December 29, 1945 Referring Provider:     Cardiac Rehab from 03/10/2020 in [facility] Cardiac and Pulmonary Rehab  Referring Provider Fransico Him MD      Encounter Date: 05/05/2020  Check In:  Session Check In - 05/05/20 1325      Check-In   Supervising physician immediately available to respond to emergencies See telemetry face sheet for immediately available ER MD    Location [facility]-Cardiac & Pulmonary Rehab    Staff Present Renita Papa, RN BSN;Jessica Luan Pulling, MA, RCEP, CCRP, CCET    Virtual Visit No    Medication changes reported     No    Fall or balance concerns reported    No    Warm-up and Cool-down Performed on first and last piece of equipment    Resistance Training Performed Yes    VAD Patient? No    PAD/SET Patient? No      Pain Assessment   Currently in Pain? No/denies              Social History   Tobacco Use  Smoking Status Never Smoker  Smokeless Tobacco Never Used    Goals Met:  Independence with exercise equipment Exercise tolerated well No report of cardiac concerns or symptoms Strength training completed today  Goals Unmet:  Not Applicable  Comments: Pt able to follow exercise prescription today without complaint.  Will continue to monitor for progression.    Dr. Emily Filbert is Medical Director for [place] and [facility].

## 2020-05-06 DIAGNOSIS — G8929 Other chronic pain: Secondary | ICD-10-CM | POA: Diagnosis not present

## 2020-05-06 DIAGNOSIS — M25511 Pain in right shoulder: Secondary | ICD-10-CM | POA: Diagnosis not present

## 2020-05-07 ENCOUNTER — Encounter: Payer: Medicare PPO | Admitting: *Deleted

## 2020-05-07 ENCOUNTER — Other Ambulatory Visit: Payer: Self-pay

## 2020-05-07 DIAGNOSIS — Z955 Presence of coronary angioplasty implant and graft: Secondary | ICD-10-CM | POA: Diagnosis not present

## 2020-05-07 NOTE — Progress Notes (Signed)
Daily Session Note  Patient Details  Name: SHAQUINTA PERUSKI MRN: [id number] Date of Birth: 1946/09/07 Referring Provider:     Cardiac Rehab from 03/10/2020 in [facility] Cardiac and Pulmonary Rehab  Referring Provider Fransico Him MD      Encounter Date: 05/07/2020  Check In:  Session Check In - 05/07/20 1345      Check-In   Supervising physician immediately available to respond to emergencies See telemetry face sheet for immediately available ER MD    Location [facility]-Cardiac & Pulmonary Rehab    Staff Present Renita Papa, RN BSN;Jessica Luan Pulling, MA, RCEP, CCRP, CCET;Laureen Marmaduke, BS, RRT, CPFT    Virtual Visit No    Medication changes reported     No    Fall or balance concerns reported    No    Warm-up and Cool-down Performed on first and last piece of equipment    Resistance Training Performed Yes    VAD Patient? No    PAD/SET Patient? No      Pain Assessment   Currently in Pain? No/denies              Social History   Tobacco Use  Smoking Status Never Smoker  Smokeless Tobacco Never Used    Goals Met:  Independence with exercise equipment Exercise tolerated well No report of cardiac concerns or symptoms Strength training completed today  Goals Unmet:  Not Applicable  Comments: Pt able to follow exercise prescription today without complaint.  Will continue to monitor for progression.    Dr. Emily Filbert is Medical Director for [place] and [facility].

## 2020-05-08 ENCOUNTER — Encounter: Payer: Medicare PPO | Attending: Cardiology | Admitting: *Deleted

## 2020-05-08 ENCOUNTER — Other Ambulatory Visit: Payer: Self-pay

## 2020-05-08 DIAGNOSIS — Z955 Presence of coronary angioplasty implant and graft: Secondary | ICD-10-CM | POA: Diagnosis not present

## 2020-05-08 NOTE — Progress Notes (Signed)
Daily Session Note  Patient Details  Name: CHARONDA HEFTER MRN: [id number] Date of Birth: Feb 07, 1946 Referring Provider:     Cardiac Rehab from 03/10/2020 in [facility] Cardiac and Pulmonary Rehab  Referring Provider Fransico Him MD      Encounter Date: 05/08/2020  Check In:  Session Check In - 05/08/20 1329      Check-In   Supervising physician immediately available to respond to emergencies See telemetry face sheet for immediately available ER MD    Location [facility]-Cardiac & Pulmonary Rehab    Staff Present Renita Papa, RN BSN;Joseph Lou [place], [place] Exercise Physiologist;Jessica [place], [place], RCEP, CCRP, CCET    Virtual Visit No    Medication changes reported     No    Fall or balance concerns reported    No    Warm-up and Cool-down Performed on first and last piece of equipment    Resistance Training Performed Yes    VAD Patient? No    PAD/SET Patient? No      Pain Assessment   Currently in Pain? No/denies              Social History   Tobacco Use  Smoking Status Never Smoker  Smokeless Tobacco Never Used    Goals Met:  Independence with exercise equipment Exercise tolerated well No report of cardiac concerns or symptoms Strength training completed today  Goals Unmet:  Not Applicable  Comments: Pt able to follow exercise prescription today without complaint.  Will continue to monitor for progression.    Dr. Emily Filbert is Medical Director for [place] and [facility].

## 2020-05-09 DIAGNOSIS — N993 Prolapse of vaginal vault after hysterectomy: Secondary | ICD-10-CM | POA: Diagnosis not present

## 2020-05-09 DIAGNOSIS — Z4689 Encounter for fitting and adjustment of other specified devices: Secondary | ICD-10-CM | POA: Diagnosis not present

## 2020-05-09 DIAGNOSIS — N952 Postmenopausal atrophic vaginitis: Secondary | ICD-10-CM | POA: Diagnosis not present

## 2020-05-09 DIAGNOSIS — N8111 Cystocele, midline: Secondary | ICD-10-CM | POA: Diagnosis not present

## 2020-05-13 NOTE — Telephone Encounter (Signed)
Donnetta Hutching, DDS, PA faxed our office to let us know that 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 Faxed on 03/15/2020.

## 2020-05-14 ENCOUNTER — Other Ambulatory Visit (INDEPENDENT_AMBULATORY_CARE_PROVIDER_SITE_OTHER): Payer: Medicare PPO

## 2020-05-14 ENCOUNTER — Encounter: Payer: Medicare PPO | Admitting: *Deleted

## 2020-05-14 ENCOUNTER — Other Ambulatory Visit: Payer: Self-pay

## 2020-05-14 DIAGNOSIS — Z955 Presence of coronary angioplasty implant and graft: Secondary | ICD-10-CM

## 2020-05-14 DIAGNOSIS — I25119 Atherosclerotic heart disease of native coronary artery with unspecified angina pectoris: Secondary | ICD-10-CM

## 2020-05-14 LAB — HEPATIC FUNCTION PANEL
ALT: 20 U/L (ref 0–35)
AST: 19 U/L (ref 0–37)
Albumin: 4.2 g/dL (ref 3.5–5.2)
Alkaline Phosphatase: 84 U/L (ref 39–117)
Bilirubin, Direct: 0.3 mg/dL (ref 0.0–0.3)
Total Bilirubin: 1.6 mg/dL — ABNORMAL HIGH (ref 0.2–1.2)
Total Protein: 6.6 g/dL (ref 6.0–8.3)

## 2020-05-14 LAB — LIPID PANEL
Cholesterol: 101 mg/dL (ref 0–200)
HDL: 31.1 mg/dL — ABNORMAL LOW (ref >39.00)
LDL Cholesterol: 58 mg/dL (ref 0–99)
NonHDL: 69.91
Total CHOL/HDL Ratio: 3
Triglycerides: 59 mg/dL (ref 0.0–149.0)
VLDL: 11.8 mg/dL (ref 0.0–40.0)

## 2020-05-14 NOTE — Progress Notes (Signed)
Daily Session Note  Patient Details  Name: Gloria [name] MRN: [id number] Date of Birth: 09/09/1946 Referring Provider:     Cardiac Rehab from 03/10/2020 in [facility] Cardiac and Pulmonary Rehab  Referring Provider Fransico Him MD      Encounter Date: 05/14/2020  Check In:  Session Check In - 05/14/20 1413      Check-In   Supervising physician immediately available to respond to emergencies See telemetry face sheet for immediately available ER MD    Location [facility]-Cardiac & Pulmonary Rehab    Staff Present Renita Papa, RN BSN;Joseph Foy [place], [place], ACSM CEP, Exercise Physiologist;Kara Eliezer Bottom, MS Exercise Physiologist    Virtual Visit No    Medication changes reported     No    Fall or balance concerns reported    No    Warm-up and Cool-down Performed on first and last piece of equipment    Resistance Training Performed Yes    VAD Patient? No    PAD/SET Patient? No      Pain Assessment   Currently in Pain? No/denies              Social History   Tobacco Use  Smoking Status Never Smoker  Smokeless Tobacco Never Used    Goals Met:  Independence with exercise equipment Exercise tolerated well No report of cardiac concerns or symptoms Strength training completed today  Goals Unmet:  Not Applicable  Comments: Pt able to follow exercise prescription today without complaint.  Will continue to monitor for progression.    Dr. Emily Filbert is Medical Director for [place] and [facility].

## 2020-05-15 ENCOUNTER — Other Ambulatory Visit: Payer: Self-pay

## 2020-05-15 ENCOUNTER — Encounter: Payer: Medicare PPO | Admitting: *Deleted

## 2020-05-15 DIAGNOSIS — Z955 Presence of coronary angioplasty implant and graft: Secondary | ICD-10-CM | POA: Diagnosis not present

## 2020-05-15 NOTE — Progress Notes (Signed)
Daily Session Note  Patient Details  Name: Gloria [name] MRN: [id number] Date of Birth: 1946/09/20 Referring Provider:     Cardiac Rehab from 03/10/2020 in [facility] Cardiac and Pulmonary Rehab  Referring Provider Fransico Him MD      Encounter Date: 05/15/2020  Check In:  Session Check In - 05/15/20 1408      Check-In   Supervising physician immediately available to respond to emergencies See telemetry face sheet for immediately available ER MD    Location [facility]-Cardiac & Pulmonary Rehab    Staff Present Renita Papa, RN BSN;Joseph [address] [place], [place], [place], CCRP, CCET    Virtual Visit No    Medication changes reported     No    Fall or balance concerns reported    No    Warm-up and Cool-down Performed on first and last piece of equipment    Resistance Training Performed Yes    VAD Patient? No    PAD/SET Patient? No      Pain Assessment   Currently in Pain? No/denies              Social History   Tobacco Use  Smoking Status Never Smoker  Smokeless Tobacco Never Used    Goals Met:  Independence with exercise equipment Exercise tolerated well No report of cardiac concerns or symptoms Strength training completed today  Goals Unmet:  Not Applicable  Comments: Pt able to follow exercise prescription today without complaint.  Will continue to monitor for progression.    Dr. Emily Filbert is Medical Director for [facility].

## 2020-05-19 ENCOUNTER — Other Ambulatory Visit: Payer: Self-pay

## 2020-05-19 ENCOUNTER — Encounter: Payer: Medicare PPO | Admitting: *Deleted

## 2020-05-19 DIAGNOSIS — Z955 Presence of coronary angioplasty implant and graft: Secondary | ICD-10-CM

## 2020-05-19 DIAGNOSIS — M25511 Pain in right shoulder: Secondary | ICD-10-CM | POA: Diagnosis not present

## 2020-05-19 DIAGNOSIS — G8929 Other chronic pain: Secondary | ICD-10-CM | POA: Diagnosis not present

## 2020-05-19 NOTE — Telephone Encounter (Signed)
Patient left a voicemail stating that she has been taking Atorvastatin 10 mg and taking two of those daily. Patient stated she would like to change that to a 20 mg pill so that she can just take one if that is okay. Patient stated that she is using [facility] now.

## 2020-05-19 NOTE — Telephone Encounter (Signed)
Pt left v/m requesting refill lipitor; I was unable to reach pt on phone and left v/m requesting cb with dosage of lipitor pt is presently taking and instructions of how pt is supposed to take med and what pharmacy. FYI it [place] LPN.

## 2020-05-19 NOTE — Progress Notes (Signed)
Daily Session Note  Patient Details  Name: Gloria [name] MRN: [id number] Date of Birth: [birth year] Referring Provider:     Cardiac Rehab from 03/10/2020 in [facility] Cardiac and Pulmonary Rehab  Referring Provider Fransico Him MD      Encounter Date: 05/19/2020  Check In:  Session Check In - 05/19/20 1413      Check-In   Supervising physician immediately available to respond to emergencies See telemetry face sheet for immediately available ER MD    Location [facility]-Cardiac & Pulmonary Rehab    Staff Present Renita Papa, RN Margurite Auerbach, MS Exercise Physiologist;Kelly Amedeo Plenty, BS, ACSM CEP, Exercise Physiologist    Virtual Visit No    Medication changes reported     No    Fall or balance concerns reported    No    Warm-up and Cool-down Performed on first and last piece of equipment    Resistance Training Performed Yes    VAD Patient? No    PAD/SET Patient? No      Pain Assessment   Currently in Pain? No/denies              Social History   Tobacco Use  Smoking Status Never Smoker  Smokeless Tobacco Never Used    Goals Met:  Independence with exercise equipment Exercise tolerated well No report of cardiac concerns or symptoms Strength training completed today  Goals Unmet:  Not Applicable  Comments: Pt able to follow exercise prescription today without complaint.  Will continue to monitor for progression.    Dr. Emily Filbert is Medical Director for [facility].

## 2020-05-20 MED ORDER — ATORVASTATIN CALCIUM 20 MG PO TABS: 20.0000 mg | ORAL_TABLET | Freq: Every day | ORAL | 3 refills | Status: DC

## 2020-05-20 NOTE — Telephone Encounter (Signed)
Sent 20 mg pills.  Thanks.

## 2020-05-21 ENCOUNTER — Encounter: Payer: Medicare PPO | Admitting: *Deleted

## 2020-05-21 ENCOUNTER — Encounter: Payer: Self-pay | Admitting: *Deleted

## 2020-05-21 ENCOUNTER — Other Ambulatory Visit: Payer: Self-pay

## 2020-05-21 DIAGNOSIS — Z955 Presence of coronary angioplasty implant and graft: Secondary | ICD-10-CM | POA: Diagnosis not present

## 2020-05-21 NOTE — Progress Notes (Signed)
Daily Session Note  Patient Details  Name: Gloria [name] MRN: [id number] Date of Birth: 1946/08/07 Referring Provider:     Cardiac Rehab from 03/10/2020 in [facility] Cardiac and Pulmonary Rehab  Referring Provider Fransico Him MD      Encounter Date: 05/21/2020  Check In:  Session Check In - 05/21/20 1406      Check-In   Supervising physician immediately available to respond to emergencies See telemetry face sheet for immediately available ER MD    Location [facility]-Cardiac & Pulmonary Rehab    Staff Present Renita Papa, RN BSN;Joseph Lou [place], [place] Exercise Physiologist    Virtual Visit No    Medication changes reported     No    Fall or balance concerns reported    No    Warm-up and Cool-down Performed on first and last piece of equipment    Resistance Training Performed Yes    VAD Patient? No    PAD/SET Patient? No      Pain Assessment   Currently in Pain? No/denies              Social History   Tobacco Use  Smoking Status Never Smoker  Smokeless Tobacco Never Used    Goals Met:  Independence with exercise equipment Exercise tolerated well No report of cardiac concerns or symptoms Strength training completed today  Goals Unmet:  Not Applicable  Comments: Pt able to follow exercise prescription today without complaint.  Will continue to monitor for progression.    Dr. Emily Filbert is Medical Director for [facility].

## 2020-05-21 NOTE — Progress Notes (Signed)
Cardiac Individual Treatment Plan  Patient Details  Name: Gloria [name] MRN: [id number] Date of Birth: 05/31/1946 Referring Provider:     Cardiac Rehab from 03/10/2020 in [facility] Cardiac and Pulmonary Rehab  Referring Provider Fransico Him MD      Initial Encounter Date:    Cardiac Rehab from 03/10/2020 in [facility] Cardiac and Pulmonary Rehab  Date 03/10/20      Visit Diagnosis: Status post coronary artery stent placement  Patient's Home Medications on Admission:  Current Outpatient Medications:  .  ACCU-CHEK AVIVA PLUS test strip, CHECK BLOOD SUGAR FOUR TIMES DAILY AS NEEDED, Disp: 100 each, Rfl: 3 .  ACCU-CHEK FASTCLIX LANCETS MISC, Use to check blood sugar 4 times daily as needed.  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  aspirin EC 81 MG tablet, Take 1 tablet (81 mg total) by mouth daily., Disp: 90 tablet, Rfl: 3 .  atorvastatin (LIPITOR) 20 MG tablet, Take 1 tablet (20 mg total) by mouth daily., Disp: 90 tablet, Rfl: 3 .  Blood Glucose Monitoring Suppl (ACCU-CHEK AVIVA PLUS) W/DEVICE KIT, Use to check blood sugar 4 times daily as needed.  Diagnosis:  E11.9  Insulin Dependent, Disp: 1 kit, Rfl: 0 .  Cholecalciferol (VITAMIN D) 50 MCG (2000 UT) CAPS, Take 2,000 Units by mouth daily. , Disp: , Rfl:  .  clopidogrel (PLAVIX) 75 MG tablet, Take 1 tablet (75 mg total) by mouth daily., Disp: 30 tablet, Rfl: 11 .  conjugated estrogens (PREMARIN) vaginal cream, Place vaginally., Disp: , Rfl:  .  famotidine (PEPCID) 20 MG tablet, TAKE ONE TABLET EVERY DAY, Disp: 30 tablet, Rfl: 0 .  hydrochlorothiazide (HYDRODIURIL) 25 MG tablet, TAKE ONE TABLET EVERY DAY, Disp: 90 tablet, Rfl: 2 .  insulin detemir (LEVEMIR FLEXTOUCH) 100 UNIT/ML FlexPen, INJECT 40 UNITS SUBCUTANEOUSLY AT BEDTIME, Disp: 90 mL, Rfl: 0 .  Insulin Pen Needle (B-D ULTRAFINE III SHORT PEN) 31G X 8 MM MISC, USE AS DIRECTED  ** DUE FOR OFFICE VISIT/PHYSICAL, Disp: 100 each, Rfl: 0 .  metFORMIN (GLUCOPHAGE) 1000 MG  tablet, TAKE 1 TABLET BY MOUTH TWICE DAILY WITH MEALS, Disp: 180 tablet, Rfl: 3 .  nitroGLYCERIN (NITROSTAT) 0.4 MG SL tablet, Place 1 tablet (0.4 mg total) under the tongue every 5 (five) minutes as needed., Disp: 25 tablet, Rfl: 3 .  Probiotic Product (ALIGN) 4 MG CAPS, Take 4 mg by mouth daily. , Disp: , Rfl:  .  Psyllium (METAMUCIL PO), Take 1 tablet by mouth daily as needed (Constipation). , Disp: , Rfl:  .  quinapril (ACCUPRIL) 20 MG tablet, TAKE ONE TABLET BY MOUTH TWICE DAILY, Disp: 180 tablet, Rfl: 2 .  vitamin C (ASCORBIC ACID) 500 MG tablet, Take 500 mg by mouth daily., Disp: , Rfl:   Past Medical History: Past Medical History:  Diagnosis Date  . ABDOMINAL PAIN OTHER SPECIFIED SITE 08/27/2010   Qualifier: Diagnosis of  By: Damita Dunnings MD, Phillip Heal    . Advance directive discussed with patient 01/03/2012   12/2011-AD d/w pt.  She has talked to husband but doesn't have living will yet.  Full code in discussion with patient today.  Would not want prolonged interventions if she were profoundly and/or permanently impaired, ie severe dementia or a condition with no hope of improvement.   01/13/15-Advance directive d/w pt- husband designated if patient were incapacitated.    . Allergic rhinitis   . Cardiac murmur 2011   with normal echo  . CARDIAC MURMUR 05/26/2010   Qualifier: Diagnosis of  By: Damita Dunnings MD, Phillip Heal    . Diabetes mellitus    Type II  . Diabetes mellitus without complication ([place]) 03/26/8415   Qualifier: Diagnosis of  By: Council Mechanic MD, Hilaria Ota   . Disorder of bone and cartilage 12/20/2007   2016 DXA: -1.4.  Consider repeat in 2021.     Marland Kitchen Essential hypertension, benign 03/30/2011  . Exertional shortness of breath 01/02/2020  . Female cystocele 08/10/2012  . Health care maintenance 02/09/2018  . Hemorrhoids   . Hyperlipidemia   . Hypertension   . Left shoulder pain 07/20/2016  . Medicare annual wellness visit, subsequent 01/03/2012  . MENOPAUSE, SURGICAL 12/16/2009   Qualifier:  Diagnosis of  By: Lurlean Nanny LPN, Regina    . Pure hypercholesterolemia 03/30/2011  . RENAL CALCULUS 05/24/2007   Qualifier: Diagnosis of  By: Council Mechanic MD, Hilaria Ota   . RHINITIS 12/24/2010   Qualifier: Diagnosis of  By: Damita Dunnings MD, Phillip Heal    . Snoring 01/02/2020  . Ureteral stone with hydronephrosis 08/02/2012    Tobacco Use: Social History   Tobacco Use  Smoking Status Never Smoker  Smokeless Tobacco Never Used    Labs: Recent Review Flowsheet Data    Labs for ITP Cardiac and Pulmonary Rehab Latest Ref Rng & Units 06/29/2019 10/01/2019 01/01/2020 03/31/2020 05/14/2020   Cholestrol 0 - 200 mg/dL 132 - 119 128 101   LDLCALC 0 - 99 mg/dL 85 - 74 81 58   HDL >39.00 mg/dL 33.20(L) - 32.70(L) 31.30(L) 31.10(L)   Trlycerides 0 - 149 mg/dL 70.0 - 61.0 77.0 59.0   Hemoglobin A1c 4.0 - 5.6 % 8.1(H) 7.5(A) 7.4(A) 7.0(A) -       Exercise Target Goals: Exercise Program Goal: Individual exercise prescription set using results from initial 6 min walk test and THRR while considering  patient's activity barriers and safety.   Exercise Prescription Goal: Initial exercise prescription builds to 30-45 minutes a day of aerobic activity, 2-3 days per week.  Home exercise guidelines will be given to patient during program as part of exercise prescription that the participant will acknowledge.   Education: Aerobic Exercise & Resistance Training: - Gives group verbal and written instruction on the various components of exercise. Focuses on aerobic and resistive training programs and the benefits of this training and how to safely progress through these programs..   Education: Exercise & Equipment Safety: - Individual verbal instruction and demonstration of equipment use and safety with use of the equipment.   Cardiac Rehab from 03/10/2020 in [facility] Cardiac and Pulmonary Rehab  Date 03/06/20  Educator [facility]  Instruction Review Code 1- Verbalizes Understanding      Education: Exercise Physiology & General  Exercise Guidelines: - Group verbal and written instruction with models to review the exercise physiology of the cardiovascular system and associated critical values. Provides general exercise guidelines with specific guidelines to those with heart or lung disease.    Education: Flexibility, Balance, Mind/Body Relaxation: Provides group verbal/written instruction on the benefits of flexibility and balance training, including mind/body exercise modes such as yoga, pilates and tai chi.  Demonstration and skill practice provided.   Activity Barriers & Risk Stratification:  Activity Barriers & Cardiac Risk Stratification - 03/10/20 1317      Activity Barriers & Cardiac Risk Stratification   Activity Barriers Arthritis;Joint Problems;Deconditioning;Shortness of Breath;Chest Pain/Angina;Balance Concerns   occasional shoulder and knee pain, limited ROM in shoulders especially overhead   Cardiac Risk Stratification Moderate           6  Minute Walk:  6 Minute Walk    Row Name 03/10/20 1316         6 Minute Walk   Phase Initial     Distance 1266 feet     Walk Time 6 minutes     # of Rest Breaks 0     MPH 2.4     METS 2.68     RPE 9     VO2 Peak 9.39     Symptoms No     Resting HR 72 bpm     Resting BP 124/62     Resting Oxygen Saturation  96 %     Exercise Oxygen Saturation  during 6 min walk 96 %     Max Ex. HR 102 bpm     Max Ex. BP 128/64     2 Minute Post BP 116/64            Oxygen Initial Assessment:   Oxygen Re-Evaluation:   Oxygen Discharge (Final Oxygen Re-Evaluation):   Initial Exercise Prescription:  Initial Exercise Prescription - 03/10/20 1300      Date of Initial Exercise RX and Referring Provider   Date 03/10/20    Referring Provider Fransico Him MD      Treadmill   MPH 2    Grade 0.5    Minutes 15    METs 2.81      NuStep   Level 2    SPM 80    Minutes 15    METs 2.5      REL-XR   Level 1    Speed 50    Minutes 15    METs 2.5        Biostep-RELP   Level 2    SPM 80    Minutes 15    METs 2      Prescription Details   Frequency (times per week) 3    Duration Progress to 30 minutes of continuous aerobic without signs/symptoms of physical distress      Intensity   THRR 40-80% of Max Heartrate 102-131    Ratings of Perceived Exertion 11-13    Perceived Dyspnea 0-4      Progression   Progression Continue to progress workloads to maintain intensity without signs/symptoms of physical distress.      Resistance Training   Training Prescription Yes    Weight 4 lb    Reps 10-15           Perform Capillary Blood Glucose checks as needed.  Exercise Prescription Changes:  Exercise Prescription Changes    Row Name 03/10/20 1300 03/17/20 1600 04/01/20 1600 04/17/20 1300 04/28/20 1400     Response to Exercise   Blood Pressure (Admit) 124/62 126/62 124/64 118/60 102/58   Blood Pressure (Exercise) 128/64 170/70 140/70 146/68 154/62   Blood Pressure (Exit) 116/64 124/64 122/64 122/64 100/58   Heart Rate (Admit) 72 bpm 76 bpm 99 bpm 87 bpm 76 bpm   Heart Rate (Exercise) 102 bpm 113 bpm 112 bpm 104 bpm 111 bpm   Heart Rate (Exit) 73 bpm 87 bpm 87 bpm 82 bpm 77 bpm   Oxygen Saturation (Admit) 96 % -- -- -- --   Oxygen Saturation (Exercise) 96 % -- -- -- --   Rating of Perceived Exertion (Exercise) '9 15 13 13 13   ' Symptoms none none none none none   Comments walk test results -- -- -- --   Duration -- Progress to 30 minutes of  aerobic without  signs/symptoms of physical distress Continue with 30 min of aerobic exercise without signs/symptoms of physical distress. Continue with 30 min of aerobic exercise without signs/symptoms of physical distress. Continue with 30 min of aerobic exercise without signs/symptoms of physical distress.   Intensity -- THRR unchanged THRR unchanged THRR unchanged THRR unchanged     Progression   Progression -- Continue to progress workloads to maintain intensity without signs/symptoms of  physical distress. Continue to progress workloads to maintain intensity without signs/symptoms of physical distress. Continue to progress workloads to maintain intensity without signs/symptoms of physical distress. Continue to progress workloads to maintain intensity without signs/symptoms of physical distress.   Average METs -- 3 3.45 2.68 3.42     Resistance Training   Training Prescription -- Yes Yes Yes Yes   Weight -- 4 lb 4 lb 4 lb 4 lb   Reps -- 10-15 10-15 10-15 10-15     Interval Training   Interval Training -- -- No No No     Treadmill   MPH -- -- 2.3 2.5 2.5   Grade -- -- '1 1 1   ' Minutes -- -- '15 15 15   ' METs -- -- 2.67 3.26 3.26     NuStep   Level -- '2 3 3 4   ' SPM -- 80 -- -- --   Minutes -- '15 15 15 15   ' METs -- 3 2.5 2.1 3.3     REL-XR   Level -- -- '3 3 3   ' Minutes -- -- '15 15 15   ' METs -- -- 4.2 -- 4.1     Biostep-RELP   Level -- 2 3 -- 3   SPM -- 80 -- -- --   Minutes -- 15 15 -- 15   METs -- 3 4 -- 3     Home Exercise Plan   Plans to continue exercise at -- -- Home (comment)  walking Home (comment)  walking Home (comment)  walking   Frequency -- -- Add 2 additional days to program exercise sessions. Add 2 additional days to program exercise sessions. Add 2 additional days to program exercise sessions.   Initial Home Exercises Provided -- -- 03/26/20 03/26/20 03/26/20   Row Name 05/14/20 1400             Response to Exercise   Blood Pressure (Admit) 126/70       Blood Pressure (Exercise) 138/64       Blood Pressure (Exit) 114/62       Heart Rate (Admit) 76 bpm       Heart Rate (Exercise) 101 bpm       Heart Rate (Exit) 82 bpm       Rating of Perceived Exertion (Exercise) 15       Symptoms none       Duration Continue with 30 min of aerobic exercise without signs/symptoms of physical distress.       Intensity THRR unchanged         Progression   Progression Continue to progress workloads to maintain intensity without signs/symptoms of physical  distress.       Average METs 3.6         Resistance Training   Training Prescription Yes       Weight 4 lb       Reps 10-15         Interval Training   Interval Training No         Treadmill   MPH 2.7  Grade 1       Minutes 15       METs 3.75         REL-XR   Level 3       Speed 50       Minutes 15       METs 3.4         Home Exercise Plan   Plans to continue exercise at Home (comment)  walking       Frequency Add 2 additional days to program exercise sessions.       Initial Home Exercises Provided 03/26/20              Exercise Comments:   Exercise Goals and Review:  Exercise Goals    Row Name 03/10/20 1326             Exercise Goals   Increase Physical Activity Yes       Intervention Provide advice, education, support and counseling about physical activity/exercise needs.;Develop an individualized exercise prescription for aerobic and resistive training based on initial evaluation findings, risk stratification, comorbidities and participant's personal goals.       Expected Outcomes Short Term: Attend rehab on a regular basis to increase amount of physical activity.;Long Term: Add in home exercise to make exercise part of routine and to increase amount of physical activity.;Long Term: Exercising regularly at least 3-5 days a week.       Increase Strength and Stamina Yes       Intervention Provide advice, education, support and counseling about physical activity/exercise needs.;Develop an individualized exercise prescription for aerobic and resistive training based on initial evaluation findings, risk stratification, comorbidities and participant's personal goals.       Expected Outcomes Short Term: Increase workloads from initial exercise prescription for resistance, speed, and METs.;Long Term: Improve cardiorespiratory fitness, muscular endurance and strength as measured by increased METs and functional capacity (6MWT);Short Term: Perform resistance training  exercises routinely during rehab and add in resistance training at home       Able to understand and use rate of perceived exertion (RPE) scale Yes       Intervention Provide education and explanation on how to use RPE scale       Expected Outcomes Short Term: Able to use RPE daily in rehab to express subjective intensity level;Long Term:  Able to use RPE to guide intensity level when exercising independently       Able to understand and use Dyspnea scale Yes       Intervention Provide education and explanation on how to use Dyspnea scale       Expected Outcomes Short Term: Able to use Dyspnea scale daily in rehab to express subjective sense of shortness of breath during exertion;Long Term: Able to use Dyspnea scale to guide intensity level when exercising independently       Knowledge and understanding of Target Heart Rate Range (THRR) Yes       Intervention Provide education and explanation of THRR including how the numbers were predicted and where they are located for reference       Expected Outcomes Short Term: Able to state/look up THRR;Short Term: Able to use daily as guideline for intensity in rehab;Long Term: Able to use THRR to govern intensity when exercising independently       Able to check pulse independently Yes       Intervention Provide education and demonstration on how to check pulse in carotid and radial arteries.;Review the importance  of being able to check your own pulse for safety during independent exercise       Expected Outcomes Short Term: Able to explain why pulse checking is important during independent exercise;Long Term: Able to check pulse independently and accurately       Understanding of Exercise Prescription Yes       Intervention Provide education, explanation, and written materials on patient's individual exercise prescription       Expected Outcomes Short Term: Able to explain program exercise prescription;Long Term: Able to explain home exercise prescription to  exercise independently              Exercise Goals Re-Evaluation :  Exercise Goals Re-Evaluation    Row Name 03/13/20 1349 04/01/20 1559 04/04/20 0752 04/17/20 1319 04/28/20 1409     Exercise Goal Re-Evaluation   Exercise Goals Review Increase Physical Activity;Able to understand and use rate of perceived exertion (RPE) scale;Knowledge and understanding of Target Heart Rate Range (THRR);Understanding of Exercise Prescription;Increase Strength and Stamina;Able to check pulse independently Increase Physical Activity;Increase Strength and Stamina;Understanding of Exercise Prescription Increase Physical Activity;Increase Strength and Stamina;Understanding of Exercise Prescription Increase Physical Activity;Increase Strength and Stamina;Understanding of Exercise Prescription Increase Physical Activity;Increase Strength and Stamina;Understanding of Exercise Prescription   Comments Reviewed RPE and dyspnea scales, THR and program prescription with pt today.  Pt voiced understanding and was given a copy of goals to take home. Gloria [name] is doing well in rehab.  She is already up to level 3 on the XR and 4 METs on the BioStep.  We will continue to monitor his progression. Gloria [name] is doing well in rehab.  She is now walking twice a week on her off days for a 1 mile.  She is now up to 23 min without getting SOB.  She can tell that her stamina is starting to improve. Gloria [name] is doing well in rehab. She is uo to level 3 on the XR and using 4 lb weights.  We will continue to monitor her progress. Gloria [name] is walking at home outside program sessions.  She walks for 30 min at a time and takes the steps whenever possible.  She has been able to walk faster per mile without shortness of breath.   Expected Outcomes Short: Use RPE daily to regulate intensity. Long: Follow program prescription in THR. Short: Add more incline to treadmill Long: Continue to improve stamina. Short: Continue to add in walking on off days  Long: Continue to  improve stamina. Short: Increase NuStep Long: Continue to improve stamina. Short: continue to exercise on her own Long: increase stamina   Row Name 04/28/20 1449 05/14/20 1505           Exercise Goal Re-Evaluation   Exercise Goals Review Increase Physical Activity;Increase Strength and Stamina;Understanding of Exercise Prescription Increase Physical Activity;Increase Strength and Stamina;Understanding of Exercise Prescription      Comments -- Gloria [name] has increased speed on TM.  She was also able to do 5 min on the elliptical.  Staff will monitor progress.      Expected Outcomes -- Short: continue to attend consistently   Long:  increase stamina and MET level             Discharge Exercise Prescription (Final Exercise Prescription Changes):  Exercise Prescription Changes - 05/14/20 1400      Response to Exercise   Blood Pressure (Admit) 126/70    Blood Pressure (Exercise) 138/64    Blood Pressure (Exit) 114/62    Heart Rate (Admit)  76 bpm    Heart Rate (Exercise) 101 bpm    Heart Rate (Exit) 82 bpm    Rating of Perceived Exertion (Exercise) 15    Symptoms none    Duration Continue with 30 min of aerobic exercise without signs/symptoms of physical distress.    Intensity THRR unchanged      Progression   Progression Continue to progress workloads to maintain intensity without signs/symptoms of physical distress.    Average METs 3.6      Resistance Training   Training Prescription Yes    Weight 4 lb    Reps 10-15      Interval Training   Interval Training No      Treadmill   MPH 2.7    Grade 1    Minutes 15    METs 3.75      REL-XR   Level 3    Speed 50    Minutes 15    METs 3.4      Home Exercise Plan   Plans to continue exercise at Home (comment)   walking   Frequency Add 2 additional days to program exercise sessions.    Initial Home Exercises Provided 03/26/20           Nutrition:  Target Goals: Understanding of nutrition guidelines, daily intake of  sodium <1550m, cholesterol <2069m calories 30% from fat and 7% or less from saturated fats, daily to have 5 or more servings of fruits and vegetables.  Education: Controlling Sodium/Reading Food Labels -Group verbal and written material supporting the discussion of sodium use in heart healthy nutrition. Review and explanation with models, verbal and written materials for utilization of the food label.   Education: General Nutrition Guidelines/Fats and Fiber: -Group instruction provided by verbal, written material, models and posters to present the general guidelines for heart healthy nutrition. Gives an explanation and review of dietary fats and fiber.   Biometrics:  Pre Biometrics - 03/10/20 1326      Pre Biometrics   Height 5' 4.6" (1.641 m)    Weight 151 lb 1.6 oz (68.5 kg)    BMI (Calculated) 25.45    Single Leg Stand 7.78 seconds            Nutrition Therapy Plan and Nutrition Goals:  Nutrition Therapy & Goals - 04/14/20 1428      Nutrition Therapy   Diet Low Na, heart healthy, diabetes friendly    Protein (specify units) 50-55g    Fiber 25 grams    Whole Grain Foods 3 servings    Saturated Fats 12 max. grams    Fruits and Vegetables 5 servings/day    Sodium 1.5 grams      Personal Nutrition Goals   Nutrition Goal ST: add protein and fat to breakfast - nuts or nut butter (fruit salad, yogurt) LT: increase stamina and increase energy (has half the energy she used to, gets fatigued very easily)    Comments Pt has recently changed her diet after heart event - more fish, more chicken, less salt, less sugar, less saturated fat, salads, etc. Pt is including healthy fats like canola oil and olive oil. Discussed MyPlate heart healthy eating, diabetes friendly eating.      Intervention Plan   Intervention Prescribe, educate and counsel regarding individualized specific dietary modifications aiming towards targeted core components such as weight, hypertension, lipid management,  diabetes, heart failure and other comorbidities.;Nutrition handout(s) given to patient.    Expected Outcomes Long Term Goal: Adherence to prescribed nutrition  plan.;Short Term Goal: A plan has been developed with personal nutrition goals set during dietitian appointment.;Short Term Goal: Understand basic principles of dietary content, such as calories, fat, sodium, cholesterol and nutrients.           Nutrition Assessments:  Nutrition Assessments - 03/10/20 1327      MEDFICTS Scores   Pre Score 20           MEDIFICTS Score Key:          ?70 Need to make dietary changes          40-70 Heart Healthy Diet         ? 40 Therapeutic Level Cholesterol Diet  Nutrition Goals Re-Evaluation:  Nutrition Goals Re-Evaluation    [place] Name 04/28/20 1413             Goals   Nutrition Goal ST: continue to have protein with breakfast Long: maintain energy              Nutrition Goals Discharge (Final Nutrition Goals Re-Evaluation):  Nutrition Goals Re-Evaluation - 04/28/20 1413      Goals   Nutrition Goal ST: continue to have protein with breakfast Long: maintain energy           Psychosocial: Target Goals: Acknowledge presence or absence of significant depression and/or stress, maximize coping skills, provide positive support system. Participant is able to verbalize types and ability to use techniques and skills needed for reducing stress and depression.   Education: Depression - Provides group verbal and written instruction on the correlation between heart/lung disease and depressed mood, treatment options, and the stigmas associated with seeking treatment.   Education: Sleep Hygiene -Provides group verbal and written instruction about how sleep can affect your health.  Define sleep hygiene, discuss sleep cycles and impact of sleep habits. Review good sleep hygiene tips.     Education: Stress and Anxiety: - Provides group verbal and written instruction about the health risks  of elevated stress and causes of high stress.  Discuss the correlation between heart/lung disease and anxiety and treatment options. Review healthy ways to manage with stress and anxiety.    Initial Review & Psychosocial Screening:  Initial Psych Review & Screening - 03/06/20 1034      Initial Review   Current issues with Current Sleep Concerns      Family Dynamics   Good Support System? Yes    Comments She states that she has moderate sleep apnea. She has to go back to the dentist to get a mouthpiece to help with her Apnea. She can look to her husband, daughter and son for support.      Barriers   Psychosocial barriers to participate in program The patient should benefit from training in stress management and relaxation.      Screening Interventions   Interventions Encouraged to exercise;Provide feedback about the scores to participant;To provide support and resources with identified psychosocial needs    Expected Outcomes Short Term goal: Utilizing psychosocial counselor, staff and physician to assist with identification of specific Stressors or current issues interfering with healing process. Setting desired goal for each stressor or current issue identified.;Long Term Goal: Stressors or current issues are controlled or eliminated.;Short Term goal: Identification and review with participant of any Quality of Life or Depression concerns found by scoring the questionnaire.;Long Term goal: The participant improves quality of Life and PHQ9 Scores as seen by post scores and/or verbalization of changes  Quality of Life Scores:   Quality of Life - 03/10/20 1327      Quality of Life   Select Quality of Life      Quality of Life Scores   Health/Function Pre 21.43 %    Socioeconomic Pre 24.07 %    Psych/Spiritual Pre 23.14 %    Family Pre 28.8 %    GLOBAL Pre 23.41 %          Scores of 19 and below usually indicate a poorer quality of life in these areas.  A difference of   2-3 points is a clinically meaningful difference.  A difference of 2-3 points in the total score of the Quality of Life Index has been associated with significant improvement in overall quality of life, self-image, physical symptoms, and general health in studies assessing change in quality of life.  PHQ-9: Recent Review Flowsheet Data    Depression screen [facility] 2/9 03/10/2020 06/29/2019 02/02/2018 01/25/2017 01/15/2016   Decreased Interest 0 0 0 0 0   Down, Depressed, Hopeless 0 0 0 0 0   PHQ - 2 Score 0 0 0 0 0   Altered sleeping 2 0 0 - -   Tired, decreased energy 2 0 0 - -   Change in appetite 0 0 0 - -   Feeling bad or failure about yourself  0 0 0 - -   Trouble concentrating 0 0 0 - -   Moving slowly or fidgety/restless 0 0 0 - -   Suicidal thoughts 0 0 0 - -   PHQ-9 Score 4 0 0 - -   Difficult doing work/chores Somewhat difficult - Not difficult at all - -     Interpretation of Total Score  Total Score Depression Severity:  1-4 = Minimal depression, 5-9 = Mild depression, 10-14 = Moderate depression, 15-19 = Moderately severe depression, 20-27 = Severe depression   Psychosocial Evaluation and Intervention:  Psychosocial Evaluation - 03/06/20 1038      Psychosocial Evaluation & Interventions   Interventions Encouraged to exercise with the program and follow exercise prescription    Comments She states that she has moderate sleep apnea. She has to go back to the dentist to get a mouthpiece to help with her Apnea. She can look to her husband, daughter and son for support.    Expected Outcomes Short: Attend [facility] stress management education to decrease stress. Long: Maintain exercise Post [facility] to keep stress at a minimum.    Continue Psychosocial Services  Follow up required by staff           Psychosocial Re-Evaluation:  Psychosocial Re-Evaluation    [place] Name 04/04/20 0753 04/28/20 1411           Psychosocial Re-Evaluation   Current issues with Current Sleep Concerns  Current Sleep Concerns      Comments Overall, Gloria [name] is doing well mentally.  She denies having any major stressors in her life.  Her biggest problem contnues to be her sleep.  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  She is trying a new pillow which helps some. Gloria [name] says here new pillow is helping her to sleep.  She goes to Dr Wednesday about her shoulder.  She is trying a mouthpiece instead of a CPAP to see how that helps      Expected Outcomes Short: Continue to find comfortable sleeping positions  Long: Continue stay positive. Short:  continue to work on sleep Long : maintain positive attitude      Interventions Encouraged to attend Cardiac Rehabilitation for the exercise Encouraged to attend Cardiac Rehabilitation for the exercise      Continue Psychosocial Services  Follow up required by staff --             Psychosocial Discharge (Final Psychosocial Re-Evaluation):  Psychosocial Re-Evaluation - 04/28/20 1411      Psychosocial Re-Evaluation   Current issues with Current Sleep Concerns    Comments Gloria [name] says here new pillow is helping her to sleep.  She goes to Dr Wednesday about her shoulder.  She is trying a mouthpiece instead of a CPAP to see how that helps    Expected Outcomes Short: continue to work on sleep Long : maintain positive attitude    Interventions Encouraged to attend Cardiac Rehabilitation for the exercise           Vocational Rehabilitation: Provide vocational rehab assistance to qualifying candidates.   Vocational Rehab Evaluation & Intervention:   Education: Education Goals: Education classes will be provided on a variety of topics geared toward better understanding of heart health and risk factor modification. Participant will state understanding/return demonstration of topics presented as noted by education test scores.  Learning Barriers/Preferences:  Learning Barriers/Preferences - 03/06/20  1036      Learning Barriers/Preferences   Learning Barriers Sight    Learning Preferences None           General Cardiac Education Topics:  AED/CPR: - Group verbal and written instruction with the use of models to demonstrate the basic use of the AED with the basic ABC's of resuscitation.   Anatomy & Physiology of the Heart: - Group verbal and written instruction and models provide basic cardiac anatomy and physiology, with the coronary electrical and arterial systems. Review of Valvular disease and Heart Failure   Cardiac Procedures: - Group verbal and written instruction to review commonly prescribed medications for heart disease. Reviews the medication, class of the drug, and side effects. Includes the steps to properly store meds and maintain the prescription regimen. (beta blockers and nitrates)   Cardiac Medications I: - Group verbal and written instruction to review commonly prescribed medications for heart disease. Reviews the medication, class of the drug, and side effects. Includes the steps to properly store meds and maintain the prescription regimen.   Cardiac Medications II: -Group verbal and written instruction to review commonly prescribed medications for heart disease. Reviews the medication, class of the drug, and side effects. (all other drug classes)    Go Sex-Intimacy & Heart Disease, Get SMART - Goal Setting: - Group verbal and written instruction through game format to discuss heart disease and the return to sexual intimacy. Provides group verbal and written material to discuss and apply goal setting through the application of the S.M.A.R.T. Method.   Other Matters of the Heart: - Provides group verbal, written materials and models to describe Stable Angina and Peripheral Artery. Includes description of the disease process and treatment options available to the cardiac patient.   Infection Prevention: - Provides verbal and written material to individual  with discussion of infection control including proper hand washing and proper equipment cleaning during exercise session.   Cardiac Rehab from 03/10/2020 in [facility] Cardiac and Pulmonary Rehab  Date 03/06/20  Educator [facility]  Instruction Review Code 1- Verbalizes Understanding      Falls Prevention: - Provides verbal and written material to individual with discussion of falls prevention  and safety.   Cardiac Rehab from 03/10/2020 in [facility] Cardiac and Pulmonary Rehab  Date 03/06/20  Educator [facility]  Instruction Review Code 1- Verbalizes Understanding      Other: -Provides group and verbal instruction on various topics (see comments)   Knowledge Questionnaire Score:  Knowledge Questionnaire Score - 03/10/20 1327      Knowledge Questionnaire Score   Pre Score 24/26 Education Focus: Exercise and Nutriton           Core Components/Risk Factors/Patient Goals at Admission:  Personal Goals and Risk Factors at Admission - 03/10/20 1328      Core Components/Risk Factors/Patient Goals on Admission    Weight Management Yes;Weight Maintenance    Intervention Weight Management: Develop a combined nutrition and exercise program designed to reach desired caloric intake, while maintaining appropriate intake of nutrient and fiber, sodium and fats, and appropriate energy expenditure required for the weight goal.;Weight Management: Provide education and appropriate resources to help participant work on and attain dietary goals.    Admit Weight 151 lb 1.6 oz (68.5 kg)    Goal Weight: Short Term 145 lb (65.8 kg)    Goal Weight: Long Term 140 lb (63.5 kg)    Expected Outcomes Short Term: Continue to assess and modify interventions until short term weight is achieved;Weight Loss: Understanding of general recommendations for a balanced deficit meal plan, which promotes 1-2 lb weight loss per week and includes a negative energy balance of [phone] kcal/d;Understanding recommendations for meals to include 15-35% energy  as protein, 25-35% energy from fat, 35-60% energy from carbohydrates, less than 286m of dietary cholesterol, 20-35 gm of total fiber daily;Understanding of distribution of calorie intake throughout the day with the consumption of 4-5 meals/snacks;Long Term: Adherence to nutrition and physical activity/exercise program aimed toward attainment of established weight goal    Diabetes Yes    Intervention Provide education about signs/symptoms and action to take for hypo/hyperglycemia.;Provide education about proper nutrition, including hydration, and aerobic/resistive exercise prescription along with prescribed medications to achieve blood glucose in normal ranges: Fasting glucose 65-99 mg/dL    Expected Outcomes Short Term: Participant verbalizes understanding of the signs/symptoms and immediate care of hyper/hypoglycemia, proper foot care and importance of medication, aerobic/resistive exercise and nutrition plan for blood glucose control.;Long Term: Attainment of HbA1C < 7%.    Lipids Yes    Intervention Provide education and support for participant on nutrition & aerobic/resistive exercise along with prescribed medications to achieve LDL <753m HDL >4041m   Expected Outcomes Short Term: Participant states understanding of desired cholesterol values and is compliant with medications prescribed. Participant is following exercise prescription and nutrition guidelines.;Long Term: Cholesterol controlled with medications as prescribed, with individualized exercise RX and with personalized nutrition plan. Value goals: LDL < 45m79mDL > 40 mg.           Education:Diabetes - Individual verbal and written instruction to review signs/symptoms of diabetes, desired ranges of glucose level fasting, after meals and with exercise. Acknowledge that pre and post exercise glucose checks will be done for 3 sessions at entry of program.   [facility]  Date 03/06/20   [facility]      Education: Know Your Numbers and Risk Factors: -Group verbal and written instruction about important numbers in your health.  Discussion of what are risk factors and how they play a role in the disease process.  Review of Cholesterol, Blood  Pressure, Diabetes, and BMI and the role they play in your overall health.   Core Components/Risk Factors/Patient Goals Review:   Goals and Risk Factor Review    Row Name 04/04/20 0845 04/28/20 1406           Core Components/Risk Factors/Patient Goals Review   Personal Goals Review Weight Management/Obesity;Hypertension;Lipids --      Review Gloria [name] is doing well in rehab.  Her weight goes up and down daily, but overall is staying under 150 lb now! She is pleased with her progress. Her blood pressures have been doing well and she is checking them at home as well.  She had her lipids checked this week and they are improving, but she is hoping for more improvement with exercise versus increased dose. Gloria [name]'s weight is tsaying under 150.  Her Lipitor was increased to 39m and she has to rechecked again in August.  We discussed how exercise helps HDL.      Expected Outcomes Short: Continue to exercise regularly Long: Continue to monitor risk factors. Short : continue to exercise and take meds as directed Long: manage risk factors             Core Components/Risk Factors/Patient Goals at Discharge (Final Review):   Goals and Risk Factor Review - 04/28/20 1406      Core Components/Risk Factors/Patient Goals Review   Review Gloria [name]'s weight is tsaying under 150.  Her Lipitor was increased to 250mand she has to rechecked again in August.  We discussed how exercise helps HDL.    Expected Outcomes Short : continue to exercise and take meds as directed Long: manage risk factors           ITP Comments:  ITP Comments    Row Name 03/06/20 1040 03/10/20 1315 03/13/20 1349 03/26/20 0613  04/23/20 0555   ITP Comments Virtual Orientation performed. Patient informed when to come in for RD and EP orientation. [facility] Completed 6MWT and gym orientation.  Initial ITP created and sent for review to Dr. MaEmily FilbertMedical Director. First full day of exercise!  Patient was oriented to gym and equipment including functions, settings, policies, and procedures.  Patient's individual exercise prescription and treatment plan were reviewed.  All starting workloads were established based on the results of the 6 minute walk test done at initial orientation visit.  The plan for exercise progression was also introduced and progression will be customized based on patient's performance and goals 30 Day review completed. ITP review done, changes made as directed,and approval shown by signature of  prScientist, research (life sciences)30 Day review completed. Medical Director ITP review done, changes made as directed, and signed approval by Medical Director.   [place] 05/21/20 0647           ITP Comments 30 Day review completed. Medical Director ITP review done, changes made as directed, and signed approval by Medical Director.              Comments:

## 2020-05-22 ENCOUNTER — Other Ambulatory Visit: Payer: Self-pay

## 2020-05-22 ENCOUNTER — Encounter: Payer: Medicare PPO | Admitting: *Deleted

## 2020-05-22 DIAGNOSIS — Z955 Presence of coronary angioplasty implant and graft: Secondary | ICD-10-CM | POA: Diagnosis not present

## 2020-05-22 NOTE — Progress Notes (Signed)
Daily Session Note  Patient Details  Name: Gloria [name] MRN: [id number] Date of Birth: 1946-07-09 Referring Provider:     Cardiac Rehab from 03/10/2020 in [facility] Cardiac and Pulmonary Rehab  Referring Provider Fransico Him MD      Encounter Date: 05/22/2020  Check In:  Session Check In - 05/22/20 1405      Check-In   Supervising physician immediately available to respond to emergencies See telemetry face sheet for immediately available ER MD    Location [facility]-Cardiac & Pulmonary Rehab    Staff Present Renita Papa, RN BSN;Joseph Hood RCP,RRT,BSRT;Laureen Golconda, [place], RRT, CPFT    Virtual Visit No    Medication changes reported     No    Fall or balance concerns reported    No    Warm-up and Cool-down Performed on first and last piece of equipment    Resistance Training Performed Yes    VAD Patient? No    PAD/SET Patient? No      Pain Assessment   Currently in Pain? No/denies              Social History   Tobacco Use  Smoking Status Never Smoker  Smokeless Tobacco Never Used    Goals Met:  Independence with exercise equipment Exercise tolerated well No report of cardiac concerns or symptoms Strength training completed today  Goals Unmet:  Not Applicable  Comments: Pt able to follow exercise prescription today without complaint.  Will continue to monitor for progression.    Dr. Emily Filbert is Medical Director for [place] and [facility].

## 2020-05-26 ENCOUNTER — Other Ambulatory Visit: Payer: Self-pay

## 2020-05-26 ENCOUNTER — Ambulatory Visit: Payer: Medicare PPO | Admitting: Cardiology

## 2020-05-26 VITALS — BP 110/56 | HR 69 | Ht 65.0 in | Wt 149.0 lb

## 2020-05-26 DIAGNOSIS — E119 Type 2 diabetes mellitus without complications: Secondary | ICD-10-CM | POA: Diagnosis not present

## 2020-05-26 DIAGNOSIS — E7800 Pure hypercholesterolemia, unspecified: Secondary | ICD-10-CM | POA: Diagnosis not present

## 2020-05-26 DIAGNOSIS — I350 Nonrheumatic aortic (valve) stenosis: Secondary | ICD-10-CM

## 2020-05-26 DIAGNOSIS — I2583 Coronary atherosclerosis due to lipid rich plaque: Secondary | ICD-10-CM

## 2020-05-26 DIAGNOSIS — I2510 Atherosclerotic heart disease of native coronary artery without angina pectoris: Secondary | ICD-10-CM | POA: Diagnosis not present

## 2020-05-26 DIAGNOSIS — I10 Essential (primary) hypertension: Secondary | ICD-10-CM

## 2020-05-26 DIAGNOSIS — G8929 Other chronic pain: Secondary | ICD-10-CM | POA: Diagnosis not present

## 2020-05-26 DIAGNOSIS — M25511 Pain in right shoulder: Secondary | ICD-10-CM | POA: Diagnosis not present

## 2020-05-26 NOTE — Patient Instructions (Addendum)
Medication Instructions:  Your physician recommends that you continue on your current medications as directed. Please refer to the Current Medication list given to you today.  *If you need a refill on your cardiac medications before your next appointment, please call your pharmacy*  Testing/Procedures: Your physician has requested that you have an echocardiogram in one year. Echocardiography is a painless test that uses sound waves to create images of your heart. It provides your doctor with information about the size and shape of your heart and how well your heart's chambers and valves are working. This procedure takes approximately one hour. There are no restrictions for this procedure.  Follow-Up: At [facility], you and your health needs are our priority.  As part of our continuing mission to provide you with exceptional heart care, we have created designated Provider Care Teams.  These Care Teams include your primary Cardiologist (physician) and Advanced Practice Providers (APPs -  Physician Assistants and Nurse Practitioners) who all work together to provide you with the care you need, when you need it.  Your next appointment:   1 year(s)  The format for your next appointment:   In Person  Provider:   You may see Traci Turner, MD or one of the following Advanced Practice Providers on your designated Care Team:    Dayna Dunn, PA-C  Michele Lenze, PA-C   

## 2020-05-26 NOTE — Progress Notes (Signed)
Cardiology Consult Note    Date:  05/26/2020   ID:  Gloria [name], DOB Jun 03, 1946, MRN [id number]  PCP:  Gloria Ghent, MD  Cardiologist:  Gloria Him, MD   Chief Complaint  Patient presents with  . Coronary Artery Disease  . Hypertension  . Hyperlipidemia    History of Present Illness:  Gloria [name] is a 74 y.o. female with a hx of DM2, heart murmur with normal echo in 2011 and HTN.  She recently complained to her PCP that she was having problems with DOE when walking that she first noticed last fall when walking with her daughter in the neighborhood.  She said that she was fine on flat ground but if she waked up an incline she would have to stop due to SOB and chest pressure.  She did have asthma as a child.  She also complained of chest pressure raidating to the back of her neck but no associated diaphoresis or nausea. She does have a fm hx of CAD on both sides of her family.  Her mother died in her 7's of an MI and thoracic aortic aneurysm.  She underwent coronary CTA showing a markedly elevated Coronary Ca score of 6179 and severe heavily calcified multivessel CAD.  She underwent cardiac cath showing 50% prox to mid LAD, 40% mLCx, 80% OM3, 95% prox to mid RCA and underwent PCI of the RCA. She is here today for followup and is doing well.  She denies any chest pain or pressure, SOB, DOE, PND, orthopnea, LE edema, dizziness, palpitations or syncope. She is compliant with her meds and is tolerating meds with no SE.     Past Medical History:  Diagnosis Date  . ABDOMINAL PAIN OTHER SPECIFIED SITE 08/27/2010   Qualifier: Diagnosis of  By: Damita Dunnings MD, Phillip Heal    . Advance directive discussed with patient 01/03/2012   12/2011-AD d/w pt.  She has talked to husband but doesn't have living will yet.  Full code in discussion with patient today.  Would not want prolonged interventions if she were profoundly and/or permanently impaired, ie severe dementia or a condition with no hope of  improvement.   01/13/15-Advance directive d/w pt- husband designated if patient were incapacitated.    . Allergic rhinitis   . Cardiac murmur 2011   with normal echo  . CARDIAC MURMUR 05/26/2010   Qualifier: Diagnosis of  By: Damita Dunnings MD, Phillip Heal    . Diabetes mellitus    Type II  . Diabetes mellitus without complication ([place]) 8/34/1962   Qualifier: Diagnosis of  By: Council Mechanic MD, Hilaria Ota   . Disorder of bone and cartilage 12/20/2007   2016 DXA: -1.4.  Consider repeat in 2021.     Marland Kitchen Essential hypertension, benign 03/30/2011  . Exertional shortness of breath 01/02/2020  . Female cystocele 08/10/2012  . Health care maintenance 02/09/2018  . Hemorrhoids   . Hyperlipidemia   . Hypertension   . Left shoulder pain 07/20/2016  . Medicare annual wellness visit, subsequent 01/03/2012  . MENOPAUSE, SURGICAL 12/16/2009   Qualifier: Diagnosis of  By: Lurlean Nanny LPN, Regina    . Pure hypercholesterolemia 03/30/2011  . RENAL CALCULUS 05/24/2007   Qualifier: Diagnosis of  By: Council Mechanic MD, Hilaria Ota   . RHINITIS 12/24/2010   Qualifier: Diagnosis of  By: Damita Dunnings MD, Phillip Heal    . Snoring 01/02/2020  . Ureteral stone with hydronephrosis 08/02/2012    Past Surgical History:  Procedure Laterality Date  . ABDOMINAL HYSTERECTOMY    .  BASAL CELL CARCINOMA EXCISION  08/23/2017   left upper arm  . CARPAL TUNNEL RELEASE     with bilateral releases  . CORONARY ATHERECTOMY N/A 02/26/2020   Procedure: CORONARY ATHERECTOMY;  Surgeon: [name], MD;  Location: [facility];  Service: Cardiovascular;  Laterality: N/A;  . CYSTECTOMY     Left breast, right lumpectomy B9  . INGUINAL HERNIA REPAIR     Right (Dr. Annamaria Boots)  . LEFT HEART CATH AND CORONARY ANGIOGRAPHY N/A 02/22/2020   Procedure: LEFT HEART CATH AND CORONARY ANGIOGRAPHY;  Surgeon: [name], MD;  Location: [facility];  Service: Cardiovascular;  Laterality: N/A;  . NSVD     X 2  . TVH with vaginal prolapse  08/29/09   Transvag Tape  w/Varitensor, Ant & Post Colporraphy, Uterosacral Lig susp, McCall Culdoplasty  . ueteroscopy  11/01   without laser secondary stone  . Urological procedure  10/22 - 08/31/09   [facility]    Current Medications: Current Meds  Medication Sig  . ACCU-CHEK AVIVA PLUS test strip CHECK BLOOD SUGAR FOUR TIMES DAILY AS NEEDED  . ACCU-CHEK FASTCLIX LANCETS MISC Use to check blood sugar 4 times daily as needed.  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  Marland Kitchen aspirin EC 81 MG tablet Take 1 tablet (81 mg total) by mouth daily.  Marland Kitchen atorvastatin (LIPITOR) 20 MG tablet Take 1 tablet (20 mg total) by mouth daily.  . Blood Glucose Monitoring Suppl (ACCU-CHEK AVIVA PLUS) W/DEVICE KIT Use to check blood sugar 4 times daily as needed.  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 Cholecalciferol (VITAMIN D) 50 MCG (2000 UT) CAPS Take 2,000 Units by mouth daily.   . clopidogrel (PLAVIX) 75 MG tablet Take 1 tablet (75 mg total) by mouth daily.  Marland Kitchen conjugated estrogens (PREMARIN) vaginal cream Place vaginally.  . hydrochlorothiazide (HYDRODIURIL) 25 MG tablet TAKE ONE TABLET EVERY DAY  . insulin detemir (LEVEMIR FLEXTOUCH) 100 UNIT/ML FlexPen INJECT 40 UNITS SUBCUTANEOUSLY AT BEDTIME  . Insulin Pen Needle (B-D ULTRAFINE III SHORT PEN) 31G X 8 MM MISC USE AS DIRECTED  ** DUE FOR OFFICE VISIT/PHYSICAL  . metFORMIN (GLUCOPHAGE) 1000 MG tablet TAKE 1 TABLET BY MOUTH TWICE DAILY WITH MEALS  . nitroGLYCERIN (NITROSTAT) 0.4 MG SL tablet Place 1 tablet (0.4 mg total) under the tongue every 5 (five) minutes as needed.  . Probiotic Product (ALIGN) 4 MG CAPS Take 4 mg by mouth daily.   . Psyllium (METAMUCIL PO) Take 1 tablet by mouth daily as needed (Constipation).   . quinapril (ACCUPRIL) 20 MG tablet TAKE ONE TABLET BY MOUTH TWICE DAILY  . vitamin C (ASCORBIC ACID) 500 MG tablet Take 500 mg by mouth daily.    Allergies:   Sulfonamide derivatives, Ciprofloxin hcl [ciprofloxacin], and Penicillins   Social History   Socioeconomic  History  . Marital status: Married    Spouse name: Not on file  . Number of children: 2  . Years of education: Not on file  . Highest education level: Not on file  Occupational History  . Occupation: Retired Pharmacist, hospital (primary education) from the [employer]    Employer: RETIRED  Tobacco Use  . Smoking status: Never Smoker  . Smokeless tobacco: Never Used  Vaping Use  . Vaping Use: Never used  Substance and Sexual Activity  . Alcohol use: Yes    Alcohol/week: 2.0 - 3.0 standard drinks    Types: 2 - 3 Glasses of wine per week  . Drug use: No  .  Sexual activity: Not Currently  Other Topics Concern  . Not on file  Social History Narrative   Retired Pharmacist, hospital   Marital Status: Married, 1968   Children: 2 children out of the home   Social Determinants of Health   Financial Resource Strain: Low Risk   . Difficulty of Paying Living Expenses: Not hard at all  Food Insecurity: No Food Insecurity  . Worried About Charity fundraiser in the Last Year: Never true  . Ran Out of Food in the Last Year: Never true  Transportation Needs: No Transportation Needs  . Lack of Transportation (Medical): No  . Lack of Transportation (Non-Medical): No  Physical Activity: Insufficiently Active  . Days of Exercise per Week: 2 days  . Minutes of Exercise per Session: 60 min  Stress: No Stress Concern Present  . Feeling of Stress : Not at all  Social Connections:   . Frequency of Communication with Friends and Family:   . Frequency of Social Gatherings with Friends and Family:   . Attends Religious Services:   . Active Member of Clubs or Organizations:   . Attends Archivist Meetings:   Marland Kitchen Marital Status:      Family History:  The patient's family history includes Alcohol abuse in her cousin; Breast cancer in her cousin; Diabetes (age of onset: 60) in her paternal aunt; Heart disease in her mother, paternal uncle, and paternal uncle; Hypertension in her mother; Prostate  cancer in her son; Stroke in her maternal aunt and paternal aunt; Thyroid disease in an other family member.   ROS:   Please see the history of present illness.    ROS All other systems reviewed and are negative.  No flowsheet data found.  Cardiac Studies Reviewed:  Cardiac Cath 02/2020 Conclusion    Prox LAD to Mid LAD lesion is 50% stenosed.  Mid Cx lesion is 40% stenosed.  3rd Mrg lesion is 80% stenosed.  Prox RCA to Mid RCA lesion is 95% stenosed.  A drug-eluting stent was successfully placed using a STENT RESOLUTE ONYX 3.5X12.  Post intervention, there is a 0% residual stenosis.   Successful atherectomy and stenting of severe calcific stenosis of the mid right coronary artery, reducing the lesion from 95% to 0% with TIMI-3 flow both pre and post procedure.  2D echo 03/2020 IMPRESSIONS   1. Normal LV systolic function; grade 1 diastolic dysfunction; mild LVH;  mildly dilated ascending aorta; moderate AS (mean gradient 25 mmHg);  moderate LAE.  2. Left ventricular ejection fraction, by estimation, is 60 to 65%. The  left ventricle has normal function. The left ventricle has no regional  wall motion abnormalities. There is mild left ventricular hypertrophy.  Left ventricular diastolic parameters  are consistent with Grade I diastolic dysfunction (impaired relaxation).  3. Right ventricular systolic function is normal. The right ventricular  size is normal. There is mildly elevated pulmonary artery systolic  pressure.  4. Left atrial size was moderately dilated.  5. The mitral valve is normal in structure. Trivial mitral valve  regurgitation. No evidence of mitral stenosis.  6. The aortic valve has an indeterminant number of cusps. Aortic valve  regurgitation is not visualized. Moderate aortic valve stenosis.  7. The inferior vena cava is normal in size with greater than 50%  respiratory variability, suggesting right atrial pressure of 3 mmHg.     PHYSICAL  EXAM:   VS:  BP (!) 110/56   Pulse 69   Ht _0  (1.651 m)  Wt 149 lb (67.6 kg)   LMP 08/08/2009   SpO2 98%   BMI 24.79 kg/m    GEN: Well nourished, well developed, in no acute distress  HEENT: normal  Neck: no JVD, carotid bruits, or masses Cardiac: RRR; no rubs, or gallops,no edema.  Intact distal pulses bilaterally. 2/6 SM at LLSB to apex Respiratory:  clear to auscultation bilaterally, normal work of breathing GI: soft, nontender, nondistended, + BS MS: no deformity or atrophy  Skin: warm and dry, no rash Neuro:  Alert and Oriented x 3, Strength and sensation are intact Psych: euthymic mood, full affect  Wt Readings from Last 3 Encounters:  05/26/20 149 lb (67.6 kg)  03/31/20 147 lb (66.7 kg)  03/12/20 151 lb 12 oz (68.8 kg)      Studies/Labs Reviewed:   EKG:  EKG is not ordered today.    Recent Labs: 01/01/2020: TSH 0.58 02/27/2020: BUN 13; Creatinine, Ser 0.52; Hemoglobin 14.6; Platelets 208; Potassium 4.7; Sodium 139 05/14/2020: ALT 20   Lipid Panel    Component Value Date/Time   CHOL 101 05/14/2020 0903   TRIG 59.0 05/14/2020 0903   HDL 31.10 (L) 05/14/2020 0903   CHOLHDL 3 05/14/2020 0903   VLDL 11.8 05/14/2020 0903   LDLCALC 58 05/14/2020 0903    Additional studies/ records that were reviewed today include:  Office notes from PCP    ASSESSMENT:    1. DOE (dyspnea on exertion)   2. Murmur, cardiac   3. DM type 2, goal HbA1c < 7% (HCC)   4. Essential hypertension, benign      PLAN:  In order of problems listed above:  1. ASCAD -recently presented with complaints of chest pain and found to have significant CAD - coronary CTA showed a markedly elevated Coronary Ca score of 6179 and severe heavily calcified multivessel CAD.  She underwent cardiac cath showing 50% prox to mid LAD, 40% mLCx, 80% OM3, 95% prox to mid RCA and underwent PCI of the RCA. -she denies any anginal symptoms -continue ASA 73m daily, Plavix 779mdaily, and statin  2. Aortic  Stenosis -2D echo for heart murmur showed moderate AS with mean AVG 2588m and DI 0.45 -repeat 2D echo in 1 year  3. DM type 2 -followed by PCP -continue metformin, statin and ACE I  4.  HTN -BP controlled -continue HCTZ 80m66mily and Quinapril 20mg16m -Creatinine 0.52 in April 2021  5.  HLD -LDL goal < 70 -LDL was 58 earlier this month -continue atorvastatin 20mg 80my    Medication Adjustments/Labs and Tests Ordered: Current medicines are reviewed at length with the patient today.  Concerns regarding medicines are outlined above.  Medication changes, Labs and Tests ordered today are listed in the Patient Instructions below.  There are no Patient Instructions on file for this visit.   Signed, Darick Fetters Gloria Him7/19/2021 2:25 PM    [facility] [id number]: [phone]

## 2020-05-26 NOTE — Addendum Note (Signed)
Addended by: Antonieta Iba on: 05/26/2020 02:37 PM   Modules accepted: Orders

## 2020-05-27 DIAGNOSIS — C4441 Basal cell carcinoma of skin of scalp and neck: Secondary | ICD-10-CM | POA: Diagnosis not present

## 2020-05-29 ENCOUNTER — Encounter: Payer: Medicare PPO | Admitting: *Deleted

## 2020-05-29 ENCOUNTER — Other Ambulatory Visit: Payer: Self-pay | Admitting: Family Medicine

## 2020-05-29 ENCOUNTER — Other Ambulatory Visit: Payer: Self-pay

## 2020-05-29 DIAGNOSIS — Z955 Presence of coronary angioplasty implant and graft: Secondary | ICD-10-CM | POA: Diagnosis not present

## 2020-05-29 NOTE — Progress Notes (Signed)
Daily Session Note  Patient Details  Name: Gloria [name] MRN: [id number] Date of Birth: 10/21/1946 Referring Provider:     Cardiac Rehab from 03/10/2020 in [facility]  Referring Provider Fransico Him MD      Encounter Date: 05/29/2020  Check In:  Session Check In - 05/29/20 1355      Check-In   Supervising physician immediately available to respond to emergencies See telemetry face sheet for immediately available ER MD    Location [facility]-Cardiac & Pulmonary Rehab    Staff Present Renita Papa, RN Margurite Auerbach, MS Exercise Physiologist;Joseph Foy [place], [place], ACSM CEP, Exercise Physiologist    Virtual Visit No    Medication changes reported     No    Fall or balance concerns reported    No    Warm-up and Cool-down Performed on first and last piece of equipment    Resistance Training Performed Yes    VAD Patient? No    PAD/SET Patient? No      Pain Assessment   Currently in Pain? No/denies              Social History   Tobacco Use  Smoking Status Never Smoker  Smokeless Tobacco Never Used    Goals Met:  Independence with exercise equipment Exercise tolerated well No report of cardiac concerns or symptoms Strength training completed today  Goals Unmet:  Not Applicable  Comments: Pt able to follow exercise prescription today without complaint.  Will continue to monitor for progression.  6 Minute Walk    Row Name 03/10/20 1316         6 Minute Walk   Phase Initial     Distance 1266 feet     Walk Time 6 minutes     # of Rest Breaks 0     MPH 2.4     METS 2.68     RPE 9     VO2 Peak 9.39     Symptoms No     Resting HR 72 bpm     Resting BP 124/62     Resting Oxygen Saturation  96 %     Exercise Oxygen Saturation  during 6 min walk 96 %     Max Ex. HR 102 bpm     Max Ex. BP 128/64     2 Minute Post BP 116/64             Dr. Emily Filbert is Medical Director for [place] and  [facility].

## 2020-06-02 ENCOUNTER — Other Ambulatory Visit: Payer: Self-pay

## 2020-06-02 ENCOUNTER — Encounter: Payer: Medicare PPO | Admitting: *Deleted

## 2020-06-02 DIAGNOSIS — Z955 Presence of coronary angioplasty implant and graft: Secondary | ICD-10-CM | POA: Diagnosis not present

## 2020-06-02 NOTE — Progress Notes (Signed)
Daily Session Note  Patient Details  Name: Gloria [name] MRN: [id number] Date of Birth: [birth year] Referring Provider:     Cardiac Rehab from 03/10/2020 in [facility] Re[facility]  Referring Provider Fransico Him MD      Encounter Date: 06/02/2020  Check In:  Session Check In - 06/02/20 1355      Check-In   Supervising physician immediately available to respond to emergencies See telemetry face sheet for immediately available ER MD    Location [facility]    Staff Present Renita Papa, RN BSN;Joseph [address] [place], [place], ACSM CEP, Exercise Physiologist;Amanda Oletta [place], [place], ACSM CEP, Exercise Physiologist    Virtual Visit No    Medication changes reported     No    Fall or balance concerns reported    No    Warm-up and Cool-down Performed on first and last piece of equipment    Resistance Training Performed Yes    VAD Patient? No    PAD/SET Patient? No      Pain Assessment   Currently in Pain? No/denies              Social History   Tobacco Use  Smoking Status Never Smoker  Smokeless Tobacco Never Used    Goals Met:  Independence with exercise equipment Exercise tolerated well No report of cardiac concerns or symptoms Strength training completed today  Goals Unmet:  Not Applicable  Comments: Pt able to follow exercise prescription today without complaint.  Will continue to monitor for progression.    Dr. Emily Filbert is Medical Director for [place] and [facility].

## 2020-06-04 ENCOUNTER — Encounter: Payer: Medicare PPO | Admitting: *Deleted

## 2020-06-04 ENCOUNTER — Other Ambulatory Visit: Payer: Self-pay

## 2020-06-04 DIAGNOSIS — Z955 Presence of coronary angioplasty implant and graft: Secondary | ICD-10-CM | POA: Diagnosis not present

## 2020-06-04 NOTE — Progress Notes (Signed)
Daily Session Note  Patient Details  Name: Gloria [name] MRN: [id number] Date of Birth: Jan 17, 1946 Referring Provider:     Cardiac Rehab from 03/10/2020 in [facility] Card[facility] and [facility] Re[facility]  Referring Provider Fransico Him MD      Encounter Date: 06/04/2020  Check In:  Session Check In - 06/04/20 1354      Check-In   Supervising physician immediately available to respond to emergencies See telemetry face sheet for immediately available ER MD    Location [facility]    Staff Present Renita Papa, RN BSN;Joseph [address] [place], [place], [place], CCRP, [place], [place], RRT, CPFT    Virtual Visit No    Medication changes reported     No    Fall or balance concerns reported    No    Warm-up and Cool-down Performed on first and last piece of equipment    Resistance Training Performed Yes    VAD Patient? No    PAD/SET Patient? No      Pain Assessment   Currently in Pain? No/denies              Social History   Tobacco Use  Smoking Status Never Smoker  Smokeless Tobacco Never Used    Goals Met:  Independence with exercise equipment Exercise tolerated well No report of cardiac concerns or symptoms Strength training completed today  Goals Unmet:  Not Applicable  Comments: Pt able to follow exercise prescription today without complaint.  Will continue to monitor for progression.    Dr. Emily Filbert is Medical Director for [place] and [facility].

## 2020-06-05 ENCOUNTER — Encounter: Payer: Medicare PPO | Admitting: *Deleted

## 2020-06-05 ENCOUNTER — Other Ambulatory Visit: Payer: Self-pay

## 2020-06-05 DIAGNOSIS — Z955 Presence of coronary angioplasty implant and graft: Secondary | ICD-10-CM

## 2020-06-05 NOTE — Progress Notes (Signed)
Daily Session Note  Patient Details  Name: Gloria [name] MRN: [id number] Date of Birth: 05-27-1946 Referring Provider:     Cardiac Rehab from 03/10/2020 in [facility] Cardiac and Pulmonary Rehab  Referring Provider Fransico Him MD      Encounter Date: 06/05/2020  Check In:  Session Check In - 06/05/20 1356      Check-In   Supervising physician immediately available to respond to emergencies See telemetry face sheet for immediately available ER MD    Location [facility]    Staff Present Renita Papa, RN BSN;Jessica Luan [place], MA, RCEP, CCRP, CCET;Joseph [place] RCP,RRT,BSRT    Virtual Visit No    Medication changes reported     No    Fall or balance concerns reported    No    Warm-up and Cool-down Performed on first and last piece of equipment    Resistance Training Performed Yes    VAD Patient? No    PAD/SET Patient? No      Pain Assessment   Currently in Pain? No/denies              Social History   Tobacco Use  Smoking Status Never Smoker  Smokeless Tobacco Never Used    Goals Met:  Independence with exercise equipment Exercise tolerated well No report of cardiac concerns or symptoms Strength training completed today  Goals Unmet:  Not Applicable  Comments: Pt able to follow exercise prescription today without complaint.  Will continue to monitor for progression.    Dr. Emily Filbert is Medical Director for [place] and [facility].

## 2020-06-09 ENCOUNTER — Other Ambulatory Visit: Payer: Self-pay

## 2020-06-09 ENCOUNTER — Encounter: Payer: Medicare PPO | Attending: Cardiology | Admitting: *Deleted

## 2020-06-09 DIAGNOSIS — Z955 Presence of coronary angioplasty implant and graft: Secondary | ICD-10-CM | POA: Insufficient documentation

## 2020-06-09 NOTE — Progress Notes (Signed)
Daily Session Note  Patient Details  Name: Gloria [name] MRN: [id number] Date of Birth: Jun 25, 1946 Referring Provider:     Cardiac Rehab from 03/10/2020 in [facility]  Referring Provider Fransico Him MD      Encounter Date: 06/09/2020  Check In:  Session Check In - 06/09/20 1359      Check-In   Supervising physician immediately available to respond to emergencies See telemetry face sheet for immediately available ER MD    Location [facility]    Staff Present Renita Papa, RN Margurite Auerbach, MS Exercise Physiologist;Kelly Amedeo Plenty, BS, ACSM CEP, Exercise Physiologist    Virtual Visit No    Medication changes reported     No    Fall or balance concerns reported    No    Warm-up and Cool-down Performed on first and last piece of equipment    Resistance Training Performed Yes    VAD Patient? No    PAD/SET Patient? No      Pain Assessment   Currently in Pain? No/denies              Social History   Tobacco Use  Smoking Status Never Smoker  Smokeless Tobacco Never Used    Goals Met:  Independence with exercise equipment Exercise tolerated well No report of cardiac concerns or symptoms Strength training completed today  Goals Unmet:  Not Applicable  Comments: Pt able to follow exercise prescription today without complaint.  Will continue to monitor for progression.    Dr. Emily Filbert is Medical Director for [place] and [facility].

## 2020-06-10 NOTE — Patient Instructions (Addendum)
Discharge Patient Instructions  Patient Details  Name: Gloria [name] MRN: [id number] Date of Birth: 04-18-1946 Referring Provider:  Sueanne Margarita, MD   Number of Visits: 59  Reason for Discharge:  Patient reached a stable level of exercise. Patient independent in their exercise. Patient has met program and personal goals.  Smoking History:  Social History   Tobacco Use  Smoking Status Never Smoker  Smokeless Tobacco Never Used    Diagnosis:  Status post coronary artery stent placement  Initial Exercise Prescription:  Initial Exercise Prescription - 03/10/20 1300      Date of Initial Exercise RX and Referring Provider   Date 03/10/20    Referring Provider Fransico Him MD      Treadmill   MPH 2    Grade 0.5    Minutes 15    METs 2.81      NuStep   Level 2    SPM 80    Minutes 15    METs 2.5      REL-XR   Level 1    Speed 50    Minutes 15    METs 2.5      Biostep-RELP   Level 2    SPM 80    Minutes 15    METs 2      Prescription Details   Frequency (times per week) 3    Duration Progress to 30 minutes of continuous aerobic without signs/symptoms of physical distress      Intensity   THRR 40-80% of Max Heartrate 102-131    Ratings of Perceived Exertion 11-13    Perceived Dyspnea 0-4      Progression   Progression Continue to progress workloads to maintain intensity without signs/symptoms of physical distress.      Resistance Training   Training Prescription Yes    Weight 4 lb    Reps 10-15           Discharge Exercise Prescription (Final Exercise Prescription Changes):  Exercise Prescription Changes - 05/26/20 1300      Response to Exercise   Blood Pressure (Admit) 120/70    Blood Pressure (Exercise) 128/70    Blood Pressure (Exit) 122/66    Heart Rate (Admit) 60 bpm    Heart Rate (Exercise) 118 bpm    Heart Rate (Exit) 80 bpm    Rating of Perceived Exertion (Exercise) 13    Symptoms none    Duration Continue with 30 min of  aerobic exercise without signs/symptoms of physical distress.    Intensity THRR unchanged      Progression   Progression Continue to progress workloads to maintain intensity without signs/symptoms of physical distress.    Average METs 4.36      Resistance Training   Training Prescription Yes    Weight 3 lb    Reps 10-15      Interval Training   Interval Training No      Treadmill   MPH 3    Grade 1    Minutes 15    METs 3.71      REL-XR   Level 3    Minutes 15    METs 5      Home Exercise Plan   Plans to continue exercise at Home (comment)   walking   Frequency Add 2 additional days to program exercise sessions.    Initial Home Exercises Provided 03/26/20           Functional Capacity:  6 Minute Walk  [place] Name 03/10/20 1316 05/29/20 1443       6 Minute Walk   Phase Initial Discharge    Distance 1266 feet 1662.5 feet    Distance % Change -- 31.3 %    Distance Feet Change -- 396.5 ft    Walk Time 6 minutes 6 minutes    # of Rest Breaks 0 0    MPH 2.4 2.4    METS 2.68 3.86    RPE 9 13    Perceived Dyspnea  -- 0    VO2 Peak 9.39 13.5    Symptoms No No    Resting HR 72 bpm 75 bpm    Resting BP 124/62 124/64    Resting Oxygen Saturation  96 % --    Exercise Oxygen Saturation  during 6 min walk 96 % --    Max Ex. HR 102 bpm 118 bpm    Max Ex. BP 128/64 164/64    2 Minute Post BP 116/64 --           Quality of Life:  Quality of Life - 03/10/20 1327      Quality of Life   Select Quality of Life      Quality of Life Scores   Health/Function Pre 21.43 %    Socioeconomic Pre 24.07 %    Psych/Spiritual Pre 23.14 %    Family Pre 28.8 %    GLOBAL Pre 23.41 %           Personal Goals: Goals established at orientation with interventions provided to work toward goal.  Personal Goals and Risk Factors at Admission - 03/10/20 1328      Core Components/Risk Factors/Patient Goals on Admission    Weight Management Yes;Weight Maintenance     Intervention Weight Management: Develop a combined nutrition and exercise program designed to reach desired caloric intake, while maintaining appropriate intake of nutrient and fiber, sodium and fats, and appropriate energy expenditure required for the weight goal.;Weight Management: Provide education and appropriate resources to help participant work on and attain dietary goals.    Admit Weight 151 lb 1.6 oz (68.5 kg)    Goal Weight: Short Term 145 lb (65.8 kg)    Goal Weight: Long Term 140 lb (63.5 kg)    Expected Outcomes Short Term: Continue to assess and modify interventions until short term weight is achieved;Weight Loss: Understanding of general recommendations for a balanced deficit meal plan, which promotes 1-2 lb weight loss per week and includes a negative energy balance of [phone] kcal/d;Understanding recommendations for meals to include 15-35% energy as protein, 25-35% energy from fat, 35-60% energy from carbohydrates, less than 259m of dietary cholesterol, 20-35 gm of total fiber daily;Understanding of distribution of calorie intake throughout the day with the consumption of 4-5 meals/snacks;Long Term: Adherence to nutrition and physical activity/exercise program aimed toward attainment of established weight goal    Diabetes Yes    Intervention Provide education about signs/symptoms and action to take for hypo/hyperglycemia.;Provide education about proper nutrition, including hydration, and aerobic/resistive exercise prescription along with prescribed medications to achieve blood glucose in normal ranges: Fasting glucose 65-99 mg/dL    Expected Outcomes Short Term: Participant verbalizes understanding of the signs/symptoms and immediate care of hyper/hypoglycemia, proper foot care and importance of medication, aerobic/resistive exercise and nutrition plan for blood glucose control.;Long Term: Attainment of HbA1C < 7%.    Lipids Yes    Intervention Provide education and support for  participant on nutrition & aerobic/resistive exercise along with prescribed  medications to achieve LDL <59m, HDL >414m    Expected Outcomes Short Term: Participant states understanding of desired cholesterol values and is compliant with medications prescribed. Participant is following exercise prescription and nutrition guidelines.;Long Term: Cholesterol controlled with medications as prescribed, with individualized exercise RX and with personalized nutrition plan. Value goals: LDL < 701mHDL > 40 mg.            Personal Goals Discharge:  Goals and Risk Factor Review - 06/04/20 1409      Core Components/Risk Factors/Patient Goals Review   Personal Goals Review Weight Management/Obesity;Hypertension;Lipids;Diabetes    Review NanDerian around 146-148 lb and she still wants to lose more.  She is getting frustrated with the plateu.  She is now on half the amount of insulin as when she started and sugars have been good. Blood pressures continue to do well. She plans to continue to keep close eye on these things.    Expected Outcomes Continue to work on weight loss and monitor risk factors.           Exercise Goals and Review:  Exercise Goals    Row Name 03/10/20 1326             Exercise Goals   Increase Physical Activity Yes       Intervention Provide advice, education, support and counseling about physical activity/exercise needs.;Develop an individualized exercise prescription for aerobic and resistive training based on initial evaluation findings, risk stratification, comorbidities and participant's personal goals.       Expected Outcomes Short Term: Attend rehab on a regular basis to increase amount of physical activity.;Long Term: Add in home exercise to make exercise part of routine and to increase amount of physical activity.;Long Term: Exercising regularly at least 3-5 days a week.       Increase Strength and Stamina Yes       Intervention Provide advice, education, support and  counseling about physical activity/exercise needs.;Develop an individualized exercise prescription for aerobic and resistive training based on initial evaluation findings, risk stratification, comorbidities and participant's personal goals.       Expected Outcomes Short Term: Increase workloads from initial exercise prescription for resistance, speed, and METs.;Long Term: Improve cardiorespiratory fitness, muscular endurance and strength as measured by increased METs and functional capacity (6MWT);Short Term: Perform resistance training exercises routinely during rehab and add in resistance training at home       Able to understand and use rate of perceived exertion (RPE) scale Yes       Intervention Provide education and explanation on how to use RPE scale       Expected Outcomes Short Term: Able to use RPE daily in rehab to express subjective intensity level;Long Term:  Able to use RPE to guide intensity level when exercising independently       Able to understand and use Dyspnea scale Yes       Intervention Provide education and explanation on how to use Dyspnea scale       Expected Outcomes Short Term: Able to use Dyspnea scale daily in rehab to express subjective sense of shortness of breath during exertion;Long Term: Able to use Dyspnea scale to guide intensity level when exercising independently       Knowledge and understanding of Target Heart Rate Range (THRR) Yes       Intervention Provide education and explanation of THRR including how the numbers were predicted and where they are located for reference       Expected  Outcomes Short Term: Able to state/look up THRR;Short Term: Able to use daily as guideline for intensity in rehab;Long Term: Able to use THRR to govern intensity when exercising independently       Able to check pulse independently Yes       Intervention Provide education and demonstration on how to check pulse in carotid and radial arteries.;Review the importance of being able to  check your own pulse for safety during independent exercise       Expected Outcomes Short Term: Able to explain why pulse checking is important during independent exercise;Long Term: Able to check pulse independently and accurately       Understanding of Exercise Prescription Yes       Intervention Provide education, explanation, and written materials on patient's individual exercise prescription       Expected Outcomes Short Term: Able to explain program exercise prescription;Long Term: Able to explain home exercise prescription to exercise independently              Exercise Goals Re-Evaluation:  Exercise Goals Re-Evaluation    Row Name 03/13/20 1349 04/01/20 1559 04/04/20 0752 04/17/20 1319 04/28/20 1409     Exercise Goal Re-Evaluation   Exercise Goals Review Increase Physical Activity;Able to understand and use rate of perceived exertion (RPE) scale;Knowledge and understanding of Target Heart Rate Range (THRR);Understanding of Exercise Prescription;Increase Strength and Stamina;Able to check pulse independently Increase Physical Activity;Increase Strength and Stamina;Understanding of Exercise Prescription Increase Physical Activity;Increase Strength and Stamina;Understanding of Exercise Prescription Increase Physical Activity;Increase Strength and Stamina;Understanding of Exercise Prescription Increase Physical Activity;Increase Strength and Stamina;Understanding of Exercise Prescription   Comments Reviewed RPE and dyspnea scales, THR and program prescription with pt today.  Pt voiced understanding and was given a copy of goals to take home. Terilynn is doing well in rehab.  She is already up to level 3 on the XR and 4 METs on the BioStep.  We will continue to monitor his progression. Eymi is doing well in rehab.  She is now walking twice a week on her off days for a 1 mile.  She is now up to 23 min without getting SOB.  She can tell that her stamina is starting to improve. Lyanne is doing well in  rehab. She is uo to level 3 on the XR and using 4 lb weights.  We will continue to monitor her progress. Nelva is walking at home outside program sessions.  She walks for 30 min at a time and takes the steps whenever possible.  She has been able to walk faster per mile without shortness of breath.   Expected Outcomes Short: Use RPE daily to regulate intensity. Long: Follow program prescription in THR. Short: Add more incline to treadmill Long: Continue to improve stamina. Short: Continue to add in walking on off days  Long: Continue to improve stamina. Short: Increase NuStep Long: Continue to improve stamina. Short: continue to exercise on her own Long: increase stamina   Row Name 04/28/20 1449 05/14/20 1505 05/26/20 1345 06/04/20 1407       Exercise Goal Re-Evaluation   Exercise Goals Review Increase Physical Activity;Increase Strength and Stamina;Understanding of Exercise Prescription Increase Physical Activity;Increase Strength and Stamina;Understanding of Exercise Prescription Increase Physical Activity;Increase Strength and Stamina;Understanding of Exercise Prescription Increase Physical Activity;Increase Strength and Stamina;Understanding of Exercise Prescription    Comments -- Calyn has increased speed on TM.  She was also able to do 5 min on the elliptical.  Staff will monitor progress. Maia is  nearing graduation.  We expect to see an increase in her post 6MWT.  She is up to 5 MET on the XR.   We will continue to monitor her progress. Jaclyn will be graduating next week.  She improved her post 6MWT by 31.3 %!!  She is planning to walk and go to [employer] (Tues/Thurs) via zoom.    Expected Outcomes -- Short: continue to attend consistently   Long:  increase stamina and MET level Short: Improve post 6MWT Long: Continue to improve stamina. Graduate and continue to exercise independently           Nutrition & Weight - Outcomes:  Pre Biometrics - 03/10/20 1326      Pre Biometrics   Height  5' 4.6" (1.641 m)    Weight 151 lb 1.6 oz (68.5 kg)    BMI (Calculated) 25.45    Single Leg Stand 7.78 seconds            Nutrition:  Nutrition Therapy & Goals - 04/14/20 1428      Nutrition Therapy   Diet Low Na, heart healthy, diabetes friendly    Protein (specify units) 50-55g    Fiber 25 grams    Whole Grain Foods 3 servings    Saturated Fats 12 max. grams    Fruits and Vegetables 5 servings/day    Sodium 1.5 grams      Personal Nutrition Goals   Nutrition Goal ST: add protein and fat to breakfast - nuts or nut butter (fruit salad, yogurt) LT: increase stamina and increase energy (has half the energy she used to, gets fatigued very easily)    Comments Pt has recently changed her diet after heart event - more fish, more chicken, less salt, less sugar, less saturated fat, salads, etc. Pt is including healthy fats like canola oil and olive oil. Discussed MyPlate heart healthy eating, diabetes friendly eating.      Intervention Plan   Intervention Prescribe, educate and counsel regarding individualized specific dietary modifications aiming towards targeted core components such as weight, hypertension, lipid management, diabetes, heart failure and other comorbidities.;Nutrition handout(s) given to patient.    Expected Outcomes Long Term Goal: Adherence to prescribed nutrition plan.;Short Term Goal: A plan has been developed with personal nutrition goals set during dietitian appointment.;Short Term Goal: Understand basic principles of dietary content, such as calories, fat, sodium, cholesterol and nutrients.           Nutrition Discharge:  Nutrition Assessments - 03/10/20 1327      MEDFICTS Scores   Pre Score 20           Education Questionnaire Score:  Knowledge Questionnaire Score - 03/10/20 1327      Knowledge Questionnaire Score   Pre Score 24/26 Education Focus: Exercise and Nutriton           Goals reviewed with patient; copy given to patient.

## 2020-06-11 ENCOUNTER — Encounter: Payer: Medicare PPO | Admitting: *Deleted

## 2020-06-11 ENCOUNTER — Other Ambulatory Visit: Payer: Self-pay

## 2020-06-11 DIAGNOSIS — Z955 Presence of coronary angioplasty implant and graft: Secondary | ICD-10-CM

## 2020-06-11 NOTE — Progress Notes (Signed)
Cardiac Individual Treatment Plan  Patient Details  Name: Gloria [name] MRN: [id number] Date of Birth: Sep 17, 1946 Referring Provider:     Cardiac Rehab from 03/10/2020 in [facility] Cardiac and Pulmonary Rehab  Referring Provider Fransico Him MD      Initial Encounter Date:    Cardiac Rehab from 03/10/2020 in [facility] Cardiac and Pulmonary Rehab  Date 03/10/20      Visit Diagnosis: Status post coronary artery stent placement  Patient's Home Medications on Admission:  Current Outpatient Medications:    ACCU-CHEK AVIVA PLUS test strip, CHECK BLOOD SUGAR FOUR TIMES DAILY AS NEEDED, Disp: 100 each, Rfl: 3   ACCU-CHEK FASTCLIX LANCETS MISC, Use to check blood sugar 4 times daily as needed.  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  Diagnosis:  E11.9  Insulin Dependent, Disp: 1 kit, Rfl: 0   Cholecalciferol (VITAMIN D) 50 MCG (2000 UT) CAPS, Take 2,000 Units by mouth daily. , Disp: , Rfl:    clopidogrel (PLAVIX) 75 MG tablet, Take 1 tablet (75 mg total) by mouth daily., Disp: 30 tablet, Rfl: 11   conjugated estrogens (PREMARIN) vaginal cream, Place vaginally., Disp: , Rfl:    hydrochlorothiazide (HYDRODIURIL) 25 MG tablet, TAKE ONE TABLET EVERY DAY, Disp: 90 tablet, Rfl: 2   insulin detemir (LEVEMIR FLEXTOUCH) 100 UNIT/ML FlexPen, INJECT 40 UNITS SUBCUTANEOUSLY AT BEDTIME, Disp: 90 mL, Rfl: 0   Insulin Pen Needle (B-D ULTRAFINE III SHORT PEN) 31G X 8 MM MISC, USE AS DIRECTED  ** DUE FOR OFFICE VISIT/PHYSICAL, Disp: 100 each, Rfl: 0   metFORMIN (GLUCOPHAGE) 1000 MG tablet, TAKE 1 TABLET BY MOUTH TWICE DAILY WITH MEALS, Disp: 180 tablet, Rfl: 3    nitroGLYCERIN (NITROSTAT) 0.4 MG SL tablet, Place 1 tablet (0.4 mg total) under the tongue every 5 (five) minutes as needed., Disp: 25 tablet, Rfl: 3   Probiotic Product (ALIGN) 4 MG CAPS, Take 4 mg by mouth daily. , Disp: , Rfl:    Psyllium (METAMUCIL PO), Take 1 tablet by mouth daily as needed (Constipation). , Disp: , Rfl:    quinapril (ACCUPRIL) 20 MG tablet, TAKE ONE TABLET BY MOUTH TWICE DAILY, Disp: 180 tablet, Rfl: 2   vitamin C (ASCORBIC ACID) 500 MG tablet, Take 500 mg by mouth daily., Disp: , Rfl:   Past Medical History: Past Medical History:  Diagnosis Date   ABDOMINAL PAIN OTHER SPECIFIED SITE 08/27/2010   Qualifier: Diagnosis of  By: Damita Dunnings MD, Phillip Heal     Advance directive discussed with patient 01/03/2012   12/2011-AD d/w pt.  She has talked to husband but doesn't have living will yet.  Full code in discussion with patient today.  Would not want prolonged interventions if she were profoundly and/or permanently impaired, ie severe dementia or a condition with no hope of improvement.   01/13/15-Advance directive d/w pt- husband designated if patient were incapacitated.     Allergic rhinitis    Cardiac murmur 2011   with normal echo   CARDIAC MURMUR 05/26/2010   Qualifier: Diagnosis of  By: Damita Dunnings MD, Phillip Heal     Diabetes mellitus    Type II  Diabetes mellitus without complication ([place]) 0/86/7619   Qualifier: Diagnosis of  By: Council Mechanic MD, Hilaria Ota    Disorder of bone and cartilage 12/20/2007   2016 DXA: -1.4.  Consider repeat in 2021.      Essential hypertension, benign 03/30/2011   Exertional shortness of breath 01/02/2020   Female cystocele 08/10/2012   Health care maintenance 02/09/2018   Hemorrhoids    Hyperlipidemia    Hypertension    Left shoulder pain 07/20/2016   Medicare annual wellness visit, subsequent 01/03/2012   MENOPAUSE, SURGICAL 12/16/2009   Qualifier: Diagnosis of  By: Lurlean Nanny LPN, Regina     Pure hypercholesterolemia 03/30/2011   RENAL  CALCULUS 05/24/2007   Qualifier: Diagnosis of  By: Council Mechanic MD, Hilaria Ota    RHINITIS 12/24/2010   Qualifier: Diagnosis of  By: Damita Dunnings MD, Phillip Heal     Snoring 01/02/2020   Ureteral stone with hydronephrosis 08/02/2012    Tobacco Use: Social History   Tobacco Use  Smoking Status Never Smoker  Smokeless Tobacco Never Used    Labs: Recent Review Flowsheet Data    Labs for ITP Cardiac and Pulmonary Rehab Latest Ref Rng & Units 06/29/2019 10/01/2019 01/01/2020 03/31/2020 05/14/2020   Cholestrol 0 - 200 mg/dL 132 - 119 128 101   LDLCALC 0 - 99 mg/dL 85 - 74 81 58   HDL >39.00 mg/dL 33.20(L) - 32.70(L) 31.30(L) 31.10(L)   Trlycerides 0 - 149 mg/dL 70.0 - 61.0 77.0 59.0   Hemoglobin A1c 4.0 - 5.6 % 8.1(H) 7.5(A) 7.4(A) 7.0(A) -       Exercise Target Goals: Exercise Program Goal: Individual exercise prescription set using results from initial 6 min walk test and THRR while considering  patients activity barriers and safety.   Exercise Prescription Goal: Initial exercise prescription builds to 30-45 minutes a day of aerobic activity, 2-3 days per week.  Home exercise guidelines will be given to patient during program as part of exercise prescription that the participant will acknowledge.   Education: Aerobic Exercise & Resistance Training: - Gives group verbal and written instruction on the various components of exercise. Focuses on aerobic and resistive training programs and the benefits of this training and how to safely progress through these programs..   Education: Exercise & Equipment Safety: - Individual verbal instruction and demonstration of equipment use and safety with use of the equipment.   Cardiac Rehab from 06/11/2020 in [facility] Cardiac and Pulmonary Rehab  Date 03/06/20  Educator [facility]  Instruction Review Code 1- Verbalizes Understanding      Education: Exercise Physiology & General Exercise Guidelines: - Group verbal and written instruction with models to review the  exercise physiology of the cardiovascular system and associated critical values. Provides general exercise guidelines with specific guidelines to those with heart or lung disease.    Cardiac Rehab from 06/11/2020 in [facility] Cardiac and Pulmonary Rehab  Date 05/21/20  Educator [facility]  Instruction Review Code 1- Verbalizes Understanding      Education: Flexibility, Balance, Mind/Body Relaxation: Provides group verbal/written instruction on the benefits of flexibility and balance training, including mind/body exercise modes such as yoga, pilates and tai chi.  Demonstration and skill practice provided.   Activity Barriers & Risk Stratification:  Activity Barriers & Cardiac Risk Stratification - 03/10/20 1317      Activity Barriers & Cardiac Risk Stratification   Activity Barriers Arthritis;Joint Problems;Deconditioning;Shortness of Breath;Chest Pain/Angina;Balance Concerns   occasional shoulder and knee pain, limited ROM in shoulders especially overhead   Cardiac Risk Stratification Moderate  6 Minute Walk:  6 Minute Walk    Row Name 03/10/20 1316 05/29/20 1443       6 Minute Walk   Phase Initial Discharge    Distance 1266 feet 1662.5 feet    Distance % Change -- 31.3 %    Distance Feet Change -- 396.5 ft    Walk Time 6 minutes 6 minutes    # of Rest Breaks 0 0    MPH 2.4 2.4    METS 2.68 3.86    RPE 9 13    Perceived Dyspnea  -- 0    VO2 Peak 9.39 13.5    Symptoms No No    Resting HR 72 bpm 75 bpm    Resting BP 124/62 124/64    Resting Oxygen Saturation  96 % --    Exercise Oxygen Saturation  during 6 min walk 96 % --    Max Ex. HR 102 bpm 118 bpm    Max Ex. BP 128/64 164/64    2 Minute Post BP 116/64 --           Oxygen Initial Assessment:   Oxygen Re-Evaluation:   Oxygen Discharge (Final Oxygen Re-Evaluation):   Initial Exercise Prescription:  Initial Exercise Prescription - 03/10/20 1300      Date of Initial Exercise RX and Referring Provider   Date  03/10/20    Referring Provider Fransico Him MD      Treadmill   MPH 2    Grade 0.5    Minutes 15    METs 2.81      NuStep   Level 2    SPM 80    Minutes 15    METs 2.5      REL-XR   Level 1    Speed 50    Minutes 15    METs 2.5      Biostep-RELP   Level 2    SPM 80    Minutes 15    METs 2      Prescription Details   Frequency (times per week) 3    Duration Progress to 30 minutes of continuous aerobic without signs/symptoms of physical distress      Intensity   THRR 40-80% of Max Heartrate 102-131    Ratings of Perceived Exertion 11-13    Perceived Dyspnea 0-4      Progression   Progression Continue to progress workloads to maintain intensity without signs/symptoms of physical distress.      Resistance Training   Training Prescription Yes    Weight 4 lb    Reps 10-15           Perform Capillary Blood Glucose checks as needed.  Exercise Prescription Changes:   Exercise Prescription Changes    Row Name 03/10/20 1300 03/17/20 1600 04/01/20 1600 04/17/20 1300 04/28/20 1400     Response to Exercise   Blood Pressure (Admit) 124/62 126/62 124/64 118/60 102/58   Blood Pressure (Exercise) 128/64 170/70 140/70 146/68 154/62   Blood Pressure (Exit) 116/64 124/64 122/64 122/64 100/58   Heart Rate (Admit) 72 bpm 76 bpm 99 bpm 87 bpm 76 bpm   Heart Rate (Exercise) 102 bpm 113 bpm 112 bpm 104 bpm 111 bpm   Heart Rate (Exit) 73 bpm 87 bpm 87 bpm 82 bpm 77 bpm   Oxygen Saturation (Admit) 96 % -- -- -- --   Oxygen Saturation (Exercise) 96 % -- -- -- --   Rating of Perceived Exertion (Exercise) _0 13  13   Symptoms _0    Comments walk test results -- -- -- --   Duration -- Progress to 30 minutes of  aerobic without signs/symptoms of physical distress Continue with 30 min of aerobic exercise without signs/symptoms of physical distress. Continue with 30 min of aerobic exercise without signs/symptoms of physical distress. Continue with 30 min of  aerobic exercise without signs/symptoms of physical distress.   Intensity -- THRR unchanged THRR unchanged THRR unchanged THRR unchanged     Progression   Progression -- Continue to progress workloads to maintain intensity without signs/symptoms of physical distress. Continue to progress workloads to maintain intensity without signs/symptoms of physical distress. Continue to progress workloads to maintain intensity without signs/symptoms of physical distress. Continue to progress workloads to maintain intensity without signs/symptoms of physical distress.   Average METs -- 3 3.45 2.68 3.42     Resistance Training   Training Prescription -- Yes Yes Yes Yes   Weight -- 4 lb 4 lb 4 lb 4 lb   Reps -- 10-15 10-15 10-15 10-15     Interval Training   Interval Training -- -- No No No     Treadmill   MPH -- -- 2.3 2.5 2.5   Grade -- -- _1 Minutes -- -- _2 METs -- -- 2.67 3.26 3.26     NuStep   Level -- _3 SPM -- 80 -- -- --   Minutes -- _4 METs -- 3 2.5 2.1 3.3     REL-XR   Level -- -- _5 Minutes -- -- _6 METs -- -- 4.2 -- 4.1     Biostep-RELP   Level -- 2 3 -- 3   SPM -- 80 -- -- --   Minutes -- 15 15 -- 15   METs -- 3 4 -- 3     Home Exercise Plan   Plans to continue exercise at -- -- Home (comment)  walking Home (comment)  walking Home (comment)  walking   Frequency -- -- Add 2 additional days to program exercise sessions. Add 2 additional days to program exercise sessions. Add 2 additional days to program exercise sessions.   Initial Home Exercises Provided -- -- 03/26/20 03/26/20 03/26/20   Row Name 05/14/20 1400 05/26/20 1300           Response to Exercise   Blood Pressure (Admit) 126/70 120/70      Blood Pressure (Exercise) 138/64 128/70      Blood Pressure (Exit) 114/62 122/66      Heart Rate (Admit) 76 bpm 60 bpm      Heart Rate (Exercise) 101 bpm 118 bpm      Heart Rate (Exit) 82 bpm 80 bpm      Rating of Perceived  Exertion (Exercise) 15 13      Symptoms none none      Duration Continue with 30 min of aerobic exercise without signs/symptoms of physical distress. Continue with 30 min of aerobic exercise without signs/symptoms of physical distress.      Intensity THRR unchanged THRR unchanged        Progression   Progression Continue to progress workloads to maintain intensity without signs/symptoms of physical distress. Continue to progress workloads to maintain intensity without signs/symptoms of physical distress.      Average METs 3.6 4.36  Resistance Training   Training Prescription Yes Yes      Weight 4 lb 3 lb      Reps 10-15 10-15        Interval Training   Interval Training No No        Treadmill   MPH 2.7 3      Grade 1 1      Minutes 15 15      METs 3.75 3.71        REL-XR   Level 3 3      Speed 50 --      Minutes 15 15      METs 3.4 5        Home Exercise Plan   Plans to continue exercise at Home (comment)  walking Home (comment)  walking      Frequency Add 2 additional days to program exercise sessions. Add 2 additional days to program exercise sessions.      Initial Home Exercises Provided 03/26/20 03/26/20             Exercise Comments:   Exercise Goals and Review:   Exercise Goals    Row Name 03/10/20 1326             Exercise Goals   Increase Physical Activity Yes       Intervention Provide advice, education, support and counseling about physical activity/exercise needs.;Develop an individualized exercise prescription for aerobic and resistive training based on initial evaluation findings, risk stratification, comorbidities and participant's personal goals.       Expected Outcomes Short Term: Attend rehab on a regular basis to increase amount of physical activity.;Long Term: Add in home exercise to make exercise part of routine and to increase amount of physical activity.;Long Term: Exercising regularly at least 3-5 days a week.       Increase Strength  and Stamina Yes       Intervention Provide advice, education, support and counseling about physical activity/exercise needs.;Develop an individualized exercise prescription for aerobic and resistive training based on initial evaluation findings, risk stratification, comorbidities and participant's personal goals.       Expected Outcomes Short Term: Increase workloads from initial exercise prescription for resistance, speed, and METs.;Long Term: Improve cardiorespiratory fitness, muscular endurance and strength as measured by increased METs and functional capacity (6MWT);Short Term: Perform resistance training exercises routinely during rehab and add in resistance training at home       Able to understand and use rate of perceived exertion (RPE) scale Yes       Intervention Provide education and explanation on how to use RPE scale       Expected Outcomes Short Term: Able to use RPE daily in rehab to express subjective intensity level;Long Term:  Able to use RPE to guide intensity level when exercising independently       Able to understand and use Dyspnea scale Yes       Intervention Provide education and explanation on how to use Dyspnea scale       Expected Outcomes Short Term: Able to use Dyspnea scale daily in rehab to express subjective sense of shortness of breath during exertion;Long Term: Able to use Dyspnea scale to guide intensity level when exercising independently       Knowledge and understanding of Target Heart Rate Range (THRR) Yes       Intervention Provide education and explanation of THRR including how the numbers were predicted and where they are located for reference  Expected Outcomes Short Term: Able to state/look up THRR;Short Term: Able to use daily as guideline for intensity in rehab;Long Term: Able to use THRR to govern intensity when exercising independently       Able to check pulse independently Yes       Intervention Provide education and demonstration on how to check  pulse in carotid and radial arteries.;Review the importance of being able to check your own pulse for safety during independent exercise       Expected Outcomes Short Term: Able to explain why pulse checking is important during independent exercise;Long Term: Able to check pulse independently and accurately       Understanding of Exercise Prescription Yes       Intervention Provide education, explanation, and written materials on patient's individual exercise prescription       Expected Outcomes Short Term: Able to explain program exercise prescription;Long Term: Able to explain home exercise prescription to exercise independently              Exercise Goals Re-Evaluation :  Exercise Goals Re-Evaluation    Row Name 03/13/20 1349 04/01/20 1559 04/04/20 0752 04/17/20 1319 04/28/20 1409     Exercise Goal Re-Evaluation   Exercise Goals Review Increase Physical Activity;Able to understand and use rate of perceived exertion (RPE) scale;Knowledge and understanding of Target Heart Rate Range (THRR);Understanding of Exercise Prescription;Increase Strength and Stamina;Able to check pulse independently Increase Physical Activity;Increase Strength and Stamina;Understanding of Exercise Prescription Increase Physical Activity;Increase Strength and Stamina;Understanding of Exercise Prescription Increase Physical Activity;Increase Strength and Stamina;Understanding of Exercise Prescription Increase Physical Activity;Increase Strength and Stamina;Understanding of Exercise Prescription   Comments Reviewed RPE and dyspnea scales, THR and program prescription with pt today.  Pt voiced understanding and was given a copy of goals to take home. Gloria [name] is doing well in rehab.  She is already up to level 3 on the XR and 4 METs on the BioStep.  We will continue to monitor his progression. Gloria [name] is doing well in rehab.  She is now walking twice a week on her off days for a 1 mile.  She is now up to 23 min without getting SOB.   She can tell that her stamina is starting to improve. Gloria [name] is doing well in rehab. She is uo to level 3 on the XR and using 4 lb weights.  We will continue to monitor her progress. Gloria [name] is walking at home outside program sessions.  She walks for 30 min at a time and takes the steps whenever possible.  She has been able to walk faster per mile without shortness of breath.   Expected Outcomes Short: Use RPE daily to regulate intensity. Long: Follow program prescription in THR. Short: Add more incline to treadmill Long: Continue to improve stamina. Short: Continue to add in walking on off days  Long: Continue to improve stamina. Short: Increase NuStep Long: Continue to improve stamina. Short: continue to exercise on her own Long: increase stamina   Row Name 04/28/20 1449 05/14/20 1505 05/26/20 1345 06/04/20 1407       Exercise Goal Re-Evaluation   Exercise Goals Review Increase Physical Activity;Increase Strength and Stamina;Understanding of Exercise Prescription Increase Physical Activity;Increase Strength and Stamina;Understanding of Exercise Prescription Increase Physical Activity;Increase Strength and Stamina;Understanding of Exercise Prescription Increase Physical Activity;Increase Strength and Stamina;Understanding of Exercise Prescription    Comments -- Gloria [name] has increased speed on TM.  She was also able to do 5 min on the elliptical.  Staff will monitor progress.  Gloria [name] is nearing graduation.  We expect to see an increase in her post 6MWT.  She is up to 5 MET on the XR.   We will continue to monitor her progress. Gloria [name] will be graduating next week.  She improved her post 6MWT by 31.3 %!!  She is planning to walk and go to [employer] (Tues/Thurs) via zoom.    Expected Outcomes -- Short: continue to attend consistently   Long:  increase stamina and MET level Short: Improve post 6MWT Long: Continue to improve stamina. Graduate and continue to exercise independently           Discharge Exercise  Prescription (Final Exercise Prescription Changes):  Exercise Prescription Changes - 05/26/20 1300      Response to Exercise   Blood Pressure (Admit) 120/70    Blood Pressure (Exercise) 128/70    Blood Pressure (Exit) 122/66    Heart Rate (Admit) 60 bpm    Heart Rate (Exercise) 118 bpm    Heart Rate (Exit) 80 bpm    Rating of Perceived Exertion (Exercise) 13    Symptoms none    Duration Continue with 30 min of aerobic exercise without signs/symptoms of physical distress.    Intensity THRR unchanged      Progression   Progression Continue to progress workloads to maintain intensity without signs/symptoms of physical distress.    Average METs 4.36      Resistance Training   Training Prescription Yes    Weight 3 lb    Reps 10-15      Interval Training   Interval Training No      Treadmill   MPH 3    Grade 1    Minutes 15    METs 3.71      REL-XR   Level 3    Minutes 15    METs 5      Home Exercise Plan   Plans to continue exercise at Home (comment)   walking   Frequency Add 2 additional days to program exercise sessions.    Initial Home Exercises Provided 03/26/20           Nutrition:  Target Goals: Understanding of nutrition guidelines, daily intake of sodium <1525m, cholesterol <2081m calories 30% from fat and 7% or less from saturated fats, daily to have 5 or more servings of fruits and vegetables.  Education: Controlling Sodium/Reading Food Labels -Group verbal and written material supporting the discussion of sodium use in heart healthy nutrition. Review and explanation with models, verbal and written materials for utilization of the food label.   Education: General Nutrition Guidelines/Fats and Fiber: -Group instruction provided by verbal, written material, models and posters to present the general guidelines for heart healthy nutrition. Gives an explanation and review of dietary fats and fiber.   Biometrics:  Pre Biometrics - 03/10/20 1326      Pre  Biometrics   Height 5' 4.6" (1.641 m)    Weight 151 lb 1.6 oz (68.5 kg)    BMI (Calculated) 25.45    Single Leg Stand 7.78 seconds            Nutrition Therapy Plan and Nutrition Goals:  Nutrition Therapy & Goals - 04/14/20 1428      Nutrition Therapy   Diet Low Na, heart healthy, diabetes friendly    Protein (specify units) 50-55g    Fiber 25 grams    Whole Grain Foods 3 servings    Saturated Fats 12 max. grams    Fruits  and Vegetables 5 servings/day    Sodium 1.5 grams      Personal Nutrition Goals   Nutrition Goal ST: add protein and fat to breakfast - nuts or nut butter (fruit salad, yogurt) LT: increase stamina and increase energy (has half the energy she used to, gets fatigued very easily)    Comments Pt has recently changed her diet after heart event - more fish, more chicken, less salt, less sugar, less saturated fat, salads, etc. Pt is including healthy fats like canola oil and olive oil. Discussed MyPlate heart healthy eating, diabetes friendly eating.      Intervention Plan   Intervention Prescribe, educate and counsel regarding individualized specific dietary modifications aiming towards targeted core components such as weight, hypertension, lipid management, diabetes, heart failure and other comorbidities.;Nutrition handout(s) given to patient.    Expected Outcomes Long Term Goal: Adherence to prescribed nutrition plan.;Short Term Goal: A plan has been developed with personal nutrition goals set during dietitian appointment.;Short Term Goal: Understand basic principles of dietary content, such as calories, fat, sodium, cholesterol and nutrients.           Nutrition Assessments:  Nutrition Assessments - 06/11/20 1413      MEDFICTS Scores   Post Score 6           MEDIFICTS Score Key:          ?70 Need to make dietary changes          40-70 Heart Healthy Diet         ? 40 Therapeutic Level Cholesterol Diet  Nutrition Goals Re-Evaluation:  Nutrition Goals  Re-Evaluation    [place] Name 04/28/20 1413 06/02/20 1418 06/04/20 1431         Goals   Nutrition Goal ST: continue to have protein with breakfast Long: maintain energy ST: try some of the heart healthy desserts from the recipe list LT: maintain energy --     Comment -- Gloria [name] reports doing well with he diet, but has a hard time with sugar cravings. Gave her some heart healthy ideas. Gloria [name] is excited and open to trying them. Gloria [name] has done well with her diet.  She is still working on her cravings.     Expected Outcome -- ST: try some of the heart healthy desserts from the recipe list LT: maintain energy Continue with heart health eating.            Nutrition Goals Discharge (Final Nutrition Goals Re-Evaluation):  Nutrition Goals Re-Evaluation - 06/04/20 1431      Goals   Comment Gloria [name] has done well with her diet.  She is still working on her cravings.    Expected Outcome Continue with heart health eating.           Psychosocial: Target Goals: Acknowledge presence or absence of significant depression and/or stress, maximize coping skills, provide positive support system. Participant is able to verbalize types and ability to use techniques and skills needed for reducing stress and depression.   Education: Depression - Provides group verbal and written instruction on the correlation between heart/lung disease and depressed mood, treatment options, and the stigmas associated with seeking treatment.   Cardiac Rehab from 06/11/2020 in [facility] Cardiac and Pulmonary Rehab  Date 06/11/20  Educator SB  Instruction Review Code 1- [employer] Understanding      Education: Sleep Hygiene -Provides group verbal and written instruction about how sleep can affect your health.  Define sleep hygiene, discuss sleep cycles and impact of sleep habits. Review  good sleep hygiene tips.     Education: Stress and Anxiety: - Provides group verbal and written instruction about the health risks of elevated stress  and causes of high stress.  Discuss the correlation between heart/lung disease and anxiety and treatment options. Review healthy ways to manage with stress and anxiety.   Cardiac Rehab from 06/11/2020 in [facility] Cardiac and Pulmonary Rehab  Date 06/11/20  Educator SB  Instruction Review Code 1- Verbalizes Understanding       Initial Review & Psychosocial Screening:  Initial Psych Review & Screening - 03/06/20 1034      Initial Review   Current issues with Current Sleep Concerns      Family Dynamics   Good Support System? Yes    Comments She states that she has moderate sleep apnea. She has to go back to the dentist to get a mouthpiece to help with her Apnea. She can look to her husband, daughter and son for support.      Barriers   Psychosocial barriers to participate in program The patient should benefit from training in stress management and relaxation.      Screening Interventions   Interventions Encouraged to exercise;Provide feedback about the scores to participant;To provide support and resources with identified psychosocial needs    Expected Outcomes Short Term goal: Utilizing psychosocial counselor, staff and physician to assist with identification of specific Stressors or current issues interfering with healing process. Setting desired goal for each stressor or current issue identified.;Long Term Goal: Stressors or current issues are controlled or eliminated.;Short Term goal: Identification and review with participant of any Quality of Life or Depression concerns found by scoring the questionnaire.;Long Term goal: The participant improves quality of Life and PHQ9 Scores as seen by post scores and/or verbalization of changes           Quality of Life Scores:   Quality of Life - 06/11/20 1412      Quality of Life Scores   Health/Function Pre 21.43 %    Health/Function Post 23.9 %    Health/Function % Change 11.53 %    Socioeconomic Pre 24.07 %    Socioeconomic Post 24.64 %     Socioeconomic % Change  2.37 %    Psych/Spiritual Pre 23.14 %    Psych/Spiritual Post 23.64 %    Psych/Spiritual % Change 2.16 %    Family Pre 28.8 %    Family Post 26.4 %    Family % Change -8.33 %    GLOBAL Pre 23.41 %    GLOBAL Post 24.37 %    GLOBAL % Change 4.1 %          Scores of 19 and below usually indicate a poorer quality of life in these areas.  A difference of  2-3 points is a clinically meaningful difference.  A difference of 2-3 points in the total score of the Quality of Life Index has been associated with significant improvement in overall quality of life, self-image, physical symptoms, and general health in studies assessing change in quality of life.  PHQ-9: Recent Review Flowsheet Data    Depression screen [facility] 2/9 06/11/2020 03/10/2020 06/29/2019 02/02/2018 01/25/2017   Decreased Interest 0 0 0 0 0   Down, Depressed, Hopeless 0 0 0 0 0   PHQ - 2 Score 0 0 0 0 0   Altered sleeping 0 2 0 0 -   Tired, decreased energy 1 2 0 0 -   Change in appetite 1 0 0  0 -   Feeling bad or failure about yourself  0 0 0 0 -   Trouble concentrating 0 0 0 0 -   Moving slowly or fidgety/restless 0 0 0 0 -   Suicidal thoughts 0 0 0 0 -   PHQ-9 Score 2 4 0 0 -   Difficult doing work/chores Not difficult at all Somewhat difficult - Not difficult at all -     Interpretation of Total Score  Total Score Depression Severity:  1-4 = Minimal depression, 5-9 = Mild depression, 10-14 = Moderate depression, 15-19 = Moderately severe depression, 20-27 = Severe depression   Psychosocial Evaluation and Intervention:  Psychosocial Evaluation - 06/04/20 1408      Discharge Psychosocial Assessment & Intervention   Comments Gloria [name] has enjoyed rehab.  She has really enjoyed getting back into the habit of exericse again.  In the past, she has also put herself second and now she is making time for her.           Psychosocial Re-Evaluation:  Psychosocial Re-Evaluation    Row Name 04/04/20 0753  04/28/20 1411           Psychosocial Re-Evaluation   Current issues with Current Sleep Concerns Current Sleep Concerns      Comments Overall, Gloria [name] is doing well mentally.  She denies having any major stressors in her life.  Her biggest problem contnues to be her sleep.  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  She is trying a new pillow which helps some. Gloria [name] says here new pillow is helping her to sleep.  She goes to Dr Wednesday about her shoulder.  She is trying a mouthpiece instead of a CPAP to see how that helps      Expected Outcomes Short: Continue to find comfortable sleeping positions  Long: Continue stay positive. Short: continue to work on sleep Long : maintain positive attitude      Interventions Encouraged to attend Cardiac Rehabilitation for the exercise Encouraged to attend Cardiac Rehabilitation for the exercise      Continue Psychosocial Services  Follow up required by staff --             Psychosocial Discharge (Final Psychosocial Re-Evaluation):  Psychosocial Re-Evaluation - 04/28/20 1411      Psychosocial Re-Evaluation   Current issues with Current Sleep Concerns    Comments Gloria [name] says here new pillow is helping her to sleep.  She goes to Dr Wednesday about her shoulder.  She is trying a mouthpiece instead of a CPAP to see how that helps    Expected Outcomes Short: continue to work on sleep Long : maintain positive attitude    Interventions Encouraged to attend Cardiac Rehabilitation for the exercise           Vocational Rehabilitation: Provide vocational rehab assistance to qualifying candidates.   Vocational Rehab Evaluation & Intervention:   Education: Education Goals: Education classes will be provided on a variety of topics geared toward better understanding of heart health and risk factor modification. Participant will state understanding/return demonstration of topics presented as noted  by education test scores.  Learning Barriers/Preferences:  Learning Barriers/Preferences - 03/06/20 1036      Learning Barriers/Preferences   Learning Barriers Sight    Learning Preferences None           General Cardiac Education Topics:  AED/CPR: - Group verbal and written instruction with the use  of models to demonstrate the basic use of the AED with the basic ABC's of resuscitation.   Anatomy & Physiology of the Heart: - Group verbal and written instruction and models provide basic cardiac anatomy and physiology, with the coronary electrical and arterial systems. Review of Valvular disease and Heart Failure   Cardiac Procedures: - Group verbal and written instruction to review commonly prescribed medications for heart disease. Reviews the medication, class of the drug, and side effects. Includes the steps to properly store meds and maintain the prescription regimen. (beta blockers and nitrates)   Cardiac Medications I: - Group verbal and written instruction to review commonly prescribed medications for heart disease. Reviews the medication, class of the drug, and side effects. Includes the steps to properly store meds and maintain the prescription regimen.   Cardiac Rehab from 06/11/2020 in [facility] Cardiac and Pulmonary Rehab  Date 06/04/20  Educator SB  Instruction Review Code 1- Verbalizes Understanding      Cardiac Medications II: -Group verbal and written instruction to review commonly prescribed medications for heart disease. Reviews the medication, class of the drug, and side effects. (all other drug classes)    Go Sex-Intimacy & Heart Disease, Get SMART - Goal Setting: - Group verbal and written instruction through game format to discuss heart disease and the return to sexual intimacy. Provides group verbal and written material to discuss and apply goal setting through the application of the S.M.A.R.T. Method.   Other Matters of the Heart: - Provides group verbal,  written materials and models to describe Stable Angina and Peripheral Artery. Includes description of the disease process and treatment options available to the cardiac patient.   Infection Prevention: - Provides verbal and written material to individual with discussion of infection control including proper hand washing and proper equipment cleaning during exercise session.   Cardiac Rehab from 06/11/2020 in [facility] Cardiac and Pulmonary Rehab  Date 03/06/20  Educator [facility]  Instruction Review Code 1- Verbalizes Understanding      Falls Prevention: - Provides verbal and written material to individual with discussion of falls prevention and safety.   Cardiac Rehab from 06/11/2020 in [facility] Cardiac and Pulmonary Rehab  Date 03/06/20  Educator [facility]  Instruction Review Code 1- Verbalizes Understanding      Other: -Provides group and verbal instruction on various topics (see comments)   Knowledge Questionnaire Score:  Knowledge Questionnaire Score - 06/11/20 1412      Knowledge Questionnaire Score   Post Score 26/26           Core Components/Risk Factors/Patient Goals at Admission:  Personal Goals and Risk Factors at Admission - 03/10/20 1328      Core Components/Risk Factors/Patient Goals on Admission    Weight Management Yes;Weight Maintenance    Intervention Weight Management: Develop a combined nutrition and exercise program designed to reach desired caloric intake, while maintaining appropriate intake of nutrient and fiber, sodium and fats, and appropriate energy expenditure required for the weight goal.;Weight Management: Provide education and appropriate resources to help participant work on and attain dietary goals.    Admit Weight 151 lb 1.6 oz (68.5 kg)    Goal Weight: Short Term 145 lb (65.8 kg)    Goal Weight: Long Term 140 lb (63.5 kg)    Expected Outcomes Short Term: Continue to assess and modify interventions until short term weight is achieved;Weight Loss: Understanding of  general recommendations for a balanced deficit meal plan, which promotes 1-2 lb weight loss per week and includes a  negative energy balance of [phone] kcal/d;Understanding recommendations for meals to include 15-35% energy as protein, 25-35% energy from fat, 35-60% energy from carbohydrates, less than 247m of dietary cholesterol, 20-35 gm of total fiber daily;Understanding of distribution of calorie intake throughout the day with the consumption of 4-5 meals/snacks;Long Term: Adherence to nutrition and physical activity/exercise program aimed toward attainment of established weight goal    Diabetes Yes    Intervention Provide education about signs/symptoms and action to take for hypo/hyperglycemia.;Provide education about proper nutrition, including hydration, and aerobic/resistive exercise prescription along with prescribed medications to achieve blood glucose in normal ranges: Fasting glucose 65-99 mg/dL    Expected Outcomes Short Term: Participant verbalizes understanding of the signs/symptoms and immediate care of hyper/hypoglycemia, proper foot care and importance of medication, aerobic/resistive exercise and nutrition plan for blood glucose control.;Long Term: Attainment of HbA1C < 7%.    Lipids Yes    Intervention Provide education and support for participant on nutrition & aerobic/resistive exercise along with prescribed medications to achieve LDL <798m HDL >4044m   Expected Outcomes Short Term: Participant states understanding of desired cholesterol values and is compliant with medications prescribed. Participant is following exercise prescription and nutrition guidelines.;Long Term: Cholesterol controlled with medications as prescribed, with individualized exercise RX and with personalized nutrition plan. Value goals: LDL < 28m23mDL > 40 mg.           Education:Diabetes - Individual verbal and written instruction to review signs/symptoms of diabetes, desired ranges of glucose level  fasting, after meals and with exercise. Acknowledge that pre and post exercise glucose checks will be done for 3 sessions at entry of program.   [facility]  Date 03/06/20  [facility] Past[facility] Review Code 1- Verbalizes Understanding      Education: Know Your Numbers and Risk Factors: -Group verbal and written instruction about important numbers in your health.  Discussion of what are risk factors and how they play a role in the disease process.  Review of Cholesterol, Blood Pressure, Diabetes, and BMI and the role they play in your overall health.   Core Components/Risk Factors/Patient Goals Review:   Goals and Risk Factor Review    Row Name 04/04/20 0845 04/28/20 1406 06/04/20 1409         Core Components/Risk Factors/Patient Goals Review   Personal Goals Review Weight Management/Obesity;Hypertension;Lipids -- Weight Management/Obesity;Hypertension;Lipids;Diabetes     Review Gloria [name] well in rehab.  Her weight goes up and down daily, but overall is staying under 150 lb now! She is pleased with her progress. Her blood pressures have been doing well and she is checking them at home as well.  She had her lipids checked this week and they are improving, but she is hoping for more improvement with exercise versus increased dose. Gloria [name]'s weight is tsaying under 150.  Her Lipitor was increased to 20mg73m she has to rechecked again in August.  We discussed how exercise helps HDL. NancyAcelynnround 146-148 lb and she still wants to lose more.  She is getting frustrated with the plateu.  She is now on half the amount of insulin as when she started and sugars have been good. Blood pressures continue to do well. She plans to continue to keep close eye on these things.     Expected Outcomes Short: Continue to exercise regularly Long: Continue to monitor risk factors. Short : continue to exercise and take meds as directed Long: manage  risk factors  Continue to work on weight loss and monitor risk factors.            Core Components/Risk Factors/Patient Goals at Discharge (Final Review):   Goals and Risk Factor Review - 06/04/20 1409      Core Components/Risk Factors/Patient Goals Review   Personal Goals Review Weight Management/Obesity;Hypertension;Lipids;Diabetes    Review Gloria [name] is around 146-148 lb and she still wants to lose more.  She is getting frustrated with the plateu.  She is now on half the amount of insulin as when she started and sugars have been good. Blood pressures continue to do well. She plans to continue to keep close eye on these things.    Expected Outcomes Continue to work on weight loss and monitor risk factors.           ITP Comments:  ITP Comments    Row Name 03/06/20 1040 03/10/20 1315 03/13/20 1349 03/26/20 0613 04/23/20 0555   ITP Comments Virtual Orientation performed. Patient informed when to come in for RD and EP orientation. Diagnosis can be found in [facility] 02/26/2020. Completed 6MWT and gym orientation.  Initial ITP created and sent for review to Dr. Emily Filbert, Medical Director. First full day of exercise!  Patient was oriented to gym and equipment including functions, settings, policies, and procedures.  Patient's individual exercise prescription and treatment plan were reviewed.  All starting workloads were established based on the results of the 6 minute walk test done at initial orientation visit.  The plan for exercise progression was also introduced and progression will be customized based on patient's performance and goals 30 Day review completed. ITP review done, changes made as directed,and approval shown by signature of  Scientist, research (life sciences). 30 Day review completed. Medical Director ITP review done, changes made as directed, and signed approval by Medical Director.   [place] Name 05/21/20 0647 06/11/20 1412         ITP Comments 30 Day review completed. Medical Director ITP review done, changes  made as directed, and signed approval by Medical Director. Sharisa graduated today from  rehab with 36 sessions completed.  Details of the patient's exercise prescription and what She needs to do in order to continue the prescription and progress were discussed with patient.  Patient was given a copy of prescription and goals.  Patient verbalized understanding.  [name] plans to continue to exercise by walking at home.             Comments: Discharge ITP

## 2020-06-11 NOTE — Progress Notes (Signed)
Discharge Progress Report  Patient Details  Name: Gloria [name] MRN: [id number] Date of Birth: 31-Mar-1946 Referring Provider:     Cardiac Rehab from 03/10/2020 in [facility] Cardiac and Pulmonary Rehab  Referring Provider Fransico Him MD       Number of Visits: 36  Reason for Discharge:  Patient reached a stable level of exercise. Patient independent in their exercise. Patient has met program and personal goals.  Smoking History:  Social History   Tobacco Use  Smoking Status Never Smoker  Smokeless Tobacco Never Used    Diagnosis:  Status post coronary artery stent placement  ADL UCSD:   Initial Exercise Prescription:  Initial Exercise Prescription - 03/10/20 1300      Date of Initial Exercise RX and Referring Provider   Date 03/10/20    Referring Provider Fransico Him MD      Treadmill   MPH 2    Grade 0.5    Minutes 15    METs 2.81      NuStep   Level 2    SPM 80    Minutes 15    METs 2.5      REL-XR   Level 1    Speed 50    Minutes 15    METs 2.5      Biostep-RELP   Level 2    SPM 80    Minutes 15    METs 2      Prescription Details   Frequency (times per week) 3    Duration Progress to 30 minutes of continuous aerobic without signs/symptoms of physical distress      Intensity   THRR 40-80% of Max Heartrate 102-131    Ratings of Perceived Exertion 11-13    Perceived Dyspnea 0-4      Progression   Progression Continue to progress workloads to maintain intensity without signs/symptoms of physical distress.      Resistance Training   Training Prescription Yes    Weight 4 lb    Reps 10-15           Discharge Exercise Prescription (Final Exercise Prescription Changes):  Exercise Prescription Changes - 05/26/20 1300      Response to Exercise   Blood Pressure (Admit) 120/70    Blood Pressure (Exercise) 128/70    Blood Pressure (Exit) 122/66    Heart Rate (Admit) 60 bpm    Heart Rate (Exercise) 118 bpm    Heart Rate (Exit) 80 bpm     Rating of Perceived Exertion (Exercise) 13    Symptoms none    Duration Continue with 30 min of aerobic exercise without signs/symptoms of physical distress.    Intensity THRR unchanged      Progression   Progression Continue to progress workloads to maintain intensity without signs/symptoms of physical distress.    Average METs 4.36      Resistance Training   Training Prescription Yes    Weight 3 lb    Reps 10-15      Interval Training   Interval Training No      Treadmill   MPH 3    Grade 1    Minutes 15    METs 3.71      REL-XR   Level 3    Minutes 15    METs 5      Home Exercise Plan   Plans to continue exercise at Home (comment)   walking   Frequency Add 2 additional days to program exercise sessions.  Initial Home Exercises Provided 03/26/20           Functional Capacity:  6 Minute Walk    Row Name 03/10/20 1316 05/29/20 1443       6 Minute Walk   Phase Initial Discharge    Distance 1266 feet 1662.5 feet    Distance % Change -- 31.3 %    Distance Feet Change -- 396.5 ft    Walk Time 6 minutes 6 minutes    # of Rest Breaks 0 0    MPH 2.4 2.4    METS 2.68 3.86    RPE 9 13    Perceived Dyspnea  -- 0    VO2 Peak 9.39 13.5    Symptoms No No    Resting HR 72 bpm 75 bpm    Resting BP 124/62 124/64    Resting Oxygen Saturation  96 % --    Exercise Oxygen Saturation  during 6 min walk 96 % --    Max Ex. HR 102 bpm 118 bpm    Max Ex. BP 128/64 164/64    2 Minute Post BP 116/64 --           Psychological, QOL, Others - Outcomes: PHQ 2/9: Depression screen [facility] 2/9 06/11/2020 03/10/2020 06/29/2019 02/02/2018 01/25/2017  Decreased Interest 0 0 0 0 0  Down, Depressed, Hopeless 0 0 0 0 0  PHQ - 2 Score 0 0 0 0 0  Altered sleeping 0 2 0 0 -  Tired, decreased energy 1 2 0 0 -  Change in appetite 1 0 0 0 -  Feeling bad or failure about yourself  0 0 0 0 -  Trouble concentrating 0 0 0 0 -  Moving slowly or fidgety/restless 0 0 0 0 -  Suicidal thoughts 0 0 0  0 -  PHQ-9 Score 2 4 0 0 -  Difficult doing work/chores Not difficult at all Somewhat difficult - Not difficult at all -  Some recent data might be hidden    Quality of Life:  Quality of Life - 06/11/20 1412      Quality of Life Scores   Health/Function Pre 21.43 %    Health/Function Post 23.9 %    Health/Function % Change 11.53 %    Socioeconomic Pre 24.07 %    Socioeconomic Post 24.64 %    Socioeconomic % Change  2.37 %    Psych/Spiritual Pre 23.14 %    Psych/Spiritual Post 23.64 %    Psych/Spiritual % Change 2.16 %    Family Pre 28.8 %    Family Post 26.4 %    Family % Change -8.33 %    GLOBAL Pre 23.41 %    GLOBAL Post 24.37 %    GLOBAL % Change 4.1 %           Nutrition & Weight - Outcomes:  Pre Biometrics - 03/10/20 1326      Pre Biometrics   Height 5' 4.6" (1.641 m)    Weight 151 lb 1.6 oz (68.5 kg)    BMI (Calculated) 25.45    Single Leg Stand 7.78 seconds            Nutrition:  Nutrition Therapy & Goals - 04/14/20 1428      Nutrition Therapy   Diet Low Na, heart healthy, diabetes friendly    Protein (specify units) 50-55g    Fiber 25 grams    Whole Grain Foods 3 servings    Saturated Fats 12 max. grams  Fruits and Vegetables 5 servings/day    Sodium 1.5 grams      Personal Nutrition Goals   Nutrition Goal ST: add protein and fat to breakfast - nuts or nut butter (fruit salad, yogurt) LT: increase stamina and increase energy (has half the energy she used to, gets fatigued very easily)    Comments Pt has recently changed her diet after heart event - more fish, more chicken, less salt, less sugar, less saturated fat, salads, etc. Pt is including healthy fats like canola oil and olive oil. Discussed MyPlate heart healthy eating, diabetes friendly eating.      Intervention Plan   Intervention Prescribe, educate and counsel regarding individualized specific dietary modifications aiming towards targeted core components such as weight, hypertension,  lipid management, diabetes, heart failure and other comorbidities.;Nutrition handout(s) given to patient.    Expected Outcomes Long Term Goal: Adherence to prescribed nutrition plan.;Short Term Goal: A plan has been developed with personal nutrition goals set during dietitian appointment.;Short Term Goal: Understand basic principles of dietary content, such as calories, fat, sodium, cholesterol and nutrients.           Nutrition Discharge:  Nutrition Assessments - 06/11/20 1413      MEDFICTS Scores   Post Score 6           Education Questionnaire Score:  Knowledge Questionnaire Score - 06/11/20 1412      Knowledge Questionnaire Score   Post Score 26/26           Goals reviewed with patient; copy given to patient.

## 2020-06-11 NOTE — Progress Notes (Signed)
Daily Session Note  Patient Details  Name: Gloria [name] MRN: [id number] Date of Birth: Jun 05, 1946 Referring Provider:     Cardiac Rehab from 03/10/2020 in [facility]  Referring Provider Gloria Him MD      Encounter Date: 06/11/2020  Check In:  Session Check In - 06/11/20 1408      Check-In   Supervising physician immediately available to respond to emergencies See telemetry face sheet for immediately available ER MD    Location [facility]Cardiac [facility] Pulmonary Rehab    Staff Present Gloria Papa, RN BSN;Gloria [name], [place] Exercise Physiologist;Gloria Bice, RN, BSN, CCRP    Virtual Visit No    Medication changes reported     No    Fall or balance concerns reported    No    Warm-up and Cool-down Performed on first and last piece of equipment    Resistance Training Performed Yes    VAD Patient? No    PAD/SET Patient? No      Pain Assessment   Currently in Pain? No/denies              Social History   Tobacco Use  Smoking Status Never Smoker  Smokeless Tobacco Never Used    Goals Met:  Independence with exercise equipment Exercise tolerated well No report of cardiac concerns or symptoms Strength training completed today  Goals Unmet:  Not Applicable  Comments:  Gloria [name] graduated today from  rehab with 36 sessions completed.  Details of the patient's exercise prescription and what She needs to do in order to continue the prescription and progress were discussed with patient.  Patient was given a copy of prescription and goals.  Patient verbalized understanding.  Gloria [name] plans to continue to exercise by walking at home.    Dr. Emily [name] is Medical Director for [place] and [facility].

## 2020-06-17 DIAGNOSIS — M25511 Pain in right shoulder: Secondary | ICD-10-CM | POA: Diagnosis not present

## 2020-06-17 DIAGNOSIS — G8929 Other chronic pain: Secondary | ICD-10-CM | POA: Diagnosis not present

## 2020-06-30 ENCOUNTER — Other Ambulatory Visit: Payer: Self-pay | Admitting: Family Medicine

## 2020-07-01 ENCOUNTER — Telehealth: Payer: Self-pay | Admitting: Family Medicine

## 2020-07-01 ENCOUNTER — Encounter: Payer: Self-pay | Admitting: Family Medicine

## 2020-07-01 ENCOUNTER — Other Ambulatory Visit: Payer: Self-pay

## 2020-07-01 ENCOUNTER — Ambulatory Visit: Payer: Medicare PPO | Admitting: Family Medicine

## 2020-07-01 VITALS — BP 114/64 | HR 70 | Temp 95.9°F | Ht 65.0 in | Wt 146.0 lb

## 2020-07-01 DIAGNOSIS — E119 Type 2 diabetes mellitus without complications: Secondary | ICD-10-CM | POA: Diagnosis not present

## 2020-07-01 DIAGNOSIS — I2510 Atherosclerotic heart disease of native coronary artery without angina pectoris: Secondary | ICD-10-CM | POA: Diagnosis not present

## 2020-07-01 LAB — POCT GLYCOSYLATED HEMOGLOBIN (HGB A1C): Hemoglobin A1C: 6.7 % — AB (ref 4.0–5.6)

## 2020-07-01 MED ORDER — LEVEMIR FLEXTOUCH 100 UNIT/ML ~~LOC~~ SOPN
25.0000 [IU] | PEN_INJECTOR | Freq: Every day | SUBCUTANEOUS | Status: DC
Start: 2020-07-01 — End: 2020-07-15

## 2020-07-01 NOTE — Progress Notes (Signed)
This visit occurred during the SARS-CoV-2 public health emergency.  Safety protocols were in place, including screening questions prior to the visit, additional usage of staff PPE, and extensive cleaning of exam room while observing appropriate contact time as indicated for disinfecting solutions.  Diabetes:  Using medications without difficulties: yes Hypoglycemic episodes: see below, cautions d/w pt.  Rare episode.  D/w pt about tapering insulin if needed.   Hyperglycemic episodes: no Feet problems:no Blood Sugars averaging: 123 yesterday AM, lower after exercise. Down to 99 later in the day. eye exam within last year:  yes A1c 6.7, improved.  D/w pt.  She is cutting out carbs. She is on less insulin, still on metformin but sugar is improved.     Using CPAP, compliant, with good effect.  Sleeping well.  CAD.  Currently on ASA and plavix.  No bleeding.  Some occ bruising as expected with insulin injection.  I sent phone note to cardiology about DAPT duration.  He can walk w/o troubles.  She has been going to cardiac rehab.  No exertional CP.    Meds, vitals, and allergies reviewed.   ROS: Per HPI unless specifically indicated in ROS section   GEN: nad, alert and oriented HEENT: ncat NECK: supple w/o LA CV: rrr. PULM: ctab, no inc wob ABD: soft, +bs EXT: no edema SKIN: no acute rash  Diabetic foot exam: Normal inspection No skin breakdown No calluses  Normal DP pulses Normal sensation to light touch and monofilament Nails normal

## 2020-07-01 NOTE — Telephone Encounter (Signed)
Patient needs to remain on ASA and Plavix indefintely

## 2020-07-01 NOTE — Patient Instructions (Addendum)
If you have any low sugars, then keep cutting your insulin by 1 unit at a time.   Take care.  Glad to see you. Plan on recheck in about 3-4 months with A1c at the visit like today.   Update me as needed.

## 2020-07-01 NOTE — Telephone Encounter (Signed)
Left message for patient that she should remain on both ASA and Plavix indefinitely. Advised to call back with any questions.

## 2020-07-01 NOTE — Telephone Encounter (Signed)
How long do you want patient to take aspirin and plavix?  Please update patient with your rec.  Thanks.

## 2020-07-02 NOTE — Telephone Encounter (Signed)
Noted. Thanks.

## 2020-07-06 NOTE — Assessment & Plan Note (Signed)
See follow-up phone note regarding aspirin and Plavix use.  No chest pain.

## 2020-07-06 NOTE — Assessment & Plan Note (Signed)
A1c 6.7, improved.  D/w pt.  She is cutting out carbs. She is on less insulin, still on metformin but sugar is improved.

## 2020-07-15 ENCOUNTER — Other Ambulatory Visit: Payer: Self-pay | Admitting: Family Medicine

## 2020-07-16 DIAGNOSIS — M25511 Pain in right shoulder: Secondary | ICD-10-CM | POA: Diagnosis not present

## 2020-07-16 DIAGNOSIS — G8929 Other chronic pain: Secondary | ICD-10-CM | POA: Diagnosis not present

## 2020-08-19 ENCOUNTER — Encounter: Payer: Self-pay | Admitting: Family Medicine

## 2020-09-08 DIAGNOSIS — N952 Postmenopausal atrophic vaginitis: Secondary | ICD-10-CM | POA: Diagnosis not present

## 2020-09-08 DIAGNOSIS — N993 Prolapse of vaginal vault after hysterectomy: Secondary | ICD-10-CM | POA: Diagnosis not present

## 2020-09-08 DIAGNOSIS — N8111 Cystocele, midline: Secondary | ICD-10-CM | POA: Diagnosis not present

## 2020-09-08 DIAGNOSIS — Z4689 Encounter for fitting and adjustment of other specified devices: Secondary | ICD-10-CM | POA: Diagnosis not present

## 2020-09-08 DIAGNOSIS — N816 Rectocele: Secondary | ICD-10-CM | POA: Diagnosis not present

## 2020-09-30 DIAGNOSIS — Z85828 Personal history of other malignant neoplasm of skin: Secondary | ICD-10-CM | POA: Diagnosis not present

## 2020-09-30 DIAGNOSIS — D2261 Melanocytic nevi of right upper limb, including shoulder: Secondary | ICD-10-CM | POA: Diagnosis not present

## 2020-09-30 DIAGNOSIS — D2271 Melanocytic nevi of right lower limb, including hip: Secondary | ICD-10-CM | POA: Diagnosis not present

## 2020-09-30 DIAGNOSIS — D2262 Melanocytic nevi of left upper limb, including shoulder: Secondary | ICD-10-CM | POA: Diagnosis not present

## 2020-09-30 DIAGNOSIS — L570 Actinic keratosis: Secondary | ICD-10-CM | POA: Diagnosis not present

## 2020-09-30 DIAGNOSIS — L821 Other seborrheic keratosis: Secondary | ICD-10-CM | POA: Diagnosis not present

## 2020-09-30 DIAGNOSIS — D225 Melanocytic nevi of trunk: Secondary | ICD-10-CM | POA: Diagnosis not present

## 2020-10-06 ENCOUNTER — Encounter: Payer: Self-pay | Admitting: Family Medicine

## 2020-10-06 ENCOUNTER — Ambulatory Visit: Payer: Medicare PPO | Admitting: Family Medicine

## 2020-10-06 ENCOUNTER — Other Ambulatory Visit: Payer: Self-pay

## 2020-10-06 VITALS — BP 140/60 | HR 74 | Temp 98.2°F | Ht 66.0 in | Wt 143.4 lb

## 2020-10-06 DIAGNOSIS — L659 Nonscarring hair loss, unspecified: Secondary | ICD-10-CM

## 2020-10-06 DIAGNOSIS — E119 Type 2 diabetes mellitus without complications: Secondary | ICD-10-CM

## 2020-10-06 LAB — CBC WITH DIFFERENTIAL/PLATELET
Basophils Absolute: 0 10*3/uL (ref 0.0–0.1)
Basophils Relative: 1.1 % (ref 0.0–3.0)
Eosinophils Absolute: 0.1 10*3/uL (ref 0.0–0.7)
Eosinophils Relative: 3.3 % (ref 0.0–5.0)
HCT: 42.3 % (ref 36.0–46.0)
Hemoglobin: 14.4 g/dL (ref 12.0–15.0)
Lymphocytes Relative: 31.7 % (ref 12.0–46.0)
Lymphs Abs: 1.2 10*3/uL (ref 0.7–4.0)
MCHC: 34 g/dL (ref 30.0–36.0)
MCV: 92 fl (ref 78.0–100.0)
Monocytes Absolute: 0.3 10*3/uL (ref 0.1–1.0)
Monocytes Relative: 9.1 % (ref 3.0–12.0)
Neutro Abs: 2.1 10*3/uL (ref 1.4–7.7)
Neutrophils Relative %: 54.8 % (ref 43.0–77.0)
Platelets: 200 10*3/uL (ref 150.0–400.0)
RBC: 4.6 Mil/uL (ref 3.87–5.11)
RDW: 13.3 % (ref 11.5–15.5)
WBC: 3.8 10*3/uL — ABNORMAL LOW (ref 4.0–10.5)

## 2020-10-06 LAB — T4, FREE: Free T4: 1.08 ng/dL (ref 0.60–1.60)

## 2020-10-06 LAB — COMPREHENSIVE METABOLIC PANEL
ALT: 17 U/L (ref 0–35)
AST: 15 U/L (ref 0–37)
Albumin: 4.1 g/dL (ref 3.5–5.2)
Alkaline Phosphatase: 74 U/L (ref 39–117)
BUN: 16 mg/dL (ref 6–23)
CO2: 31 mEq/L (ref 19–32)
Calcium: 10.5 mg/dL (ref 8.4–10.5)
Chloride: 102 mEq/L (ref 96–112)
Creatinine, Ser: 0.49 mg/dL (ref 0.40–1.20)
GFR: 92.43 mL/min (ref >60.00)
Glucose, Bld: 134 mg/dL — ABNORMAL HIGH (ref 70–99)
Potassium: 4.3 mEq/L (ref 3.5–5.1)
Sodium: 140 mEq/L (ref 135–145)
Total Bilirubin: 2 mg/dL — ABNORMAL HIGH (ref 0.2–1.2)
Total Protein: 6.4 g/dL (ref 6.0–8.3)

## 2020-10-06 LAB — TSH: TSH: 0.47 u[IU]/mL (ref 0.35–4.50)

## 2020-10-06 LAB — HEMOGLOBIN A1C: Hgb A1c MFr Bld: 7.3 % — ABNORMAL HIGH (ref 4.6–6.5)

## 2020-10-06 MED ORDER — LEVEMIR FLEXTOUCH 100 UNIT/ML ~~LOC~~ SOPN
PEN_INJECTOR | SUBCUTANEOUS | Status: DC
Start: 2020-10-06 — End: 2021-01-05

## 2020-10-06 MED ORDER — BIOTIN 5 MG PO TABS: ORAL_TABLET | ORAL | 0 refills | Status: DC

## 2020-10-06 NOTE — Patient Instructions (Addendum)
Reasonable try biotin and see if that helps with your hair.   Go to the lab on the way out.   If you have mychart we'll likely use that to update you.    Take care.  Glad to see you. Don't change your meds for now.  Plan on recheck in about 3-4 months.

## 2020-10-06 NOTE — Progress Notes (Signed)
This visit occurred during the SARS-CoV-2 public health emergency.  Safety protocols were in place, including screening questions prior to the visit, additional usage of staff PPE, and extensive cleaning of exam room while observing appropriate contact time as indicated for disinfecting solutions.  Diabetes:  Using medications without difficulties: yes, insulin and metformin.   Hypoglycemic episodes: no Hyperglycemic episodes: no Feet problems: no Blood Sugars averaging: usually ~ 120 Intentional weight loss with diet and exercise.  Lower insulin dose now.  A1c is still reasonable, especially considering she is on a lower dose of insulin now.  She is going to f/u with gynecology/urology re: pessary.  She had some irritation with hard pessary and plavix use.    She noted more hair loss in the last few months.  Only new med this year was plavix.  No CP, not SOB.  Diffuse loss.    Meds, vitals, and allergies reviewed.   ROS: Per HPI unless specifically indicated in ROS section   GEN: nad, alert and oriented HEENT: ncat NECK: supple w/o LA CV: rrr. Soft SEM noted.   PULM: ctab, no inc wob ABD: soft, +bs EXT: no edema SKIN: no acute rash She has some diffuse thinning of hair noted.  She does not have patchy alopecia.

## 2020-10-08 ENCOUNTER — Encounter: Payer: Self-pay | Admitting: Family Medicine

## 2020-10-08 ENCOUNTER — Other Ambulatory Visit: Payer: Self-pay | Admitting: Family Medicine

## 2020-10-08 DIAGNOSIS — L659 Nonscarring hair loss, unspecified: Secondary | ICD-10-CM | POA: Insufficient documentation

## 2020-10-08 NOTE — Assessment & Plan Note (Signed)
Intentional weight loss with diet and exercise.  Lower insulin dose now.  A1c is still reasonable, especially considering she is on a lower dose of insulin now.  Continue insulin Metformin and recheck periodically.  See after visit summary.

## 2020-10-08 NOTE — Assessment & Plan Note (Signed)
Unclear source.  Reasonable to check routine labs and try adding on biotin.  Okay for outpatient follow-up.  She agrees with plan.

## 2020-10-14 DIAGNOSIS — L650 Telogen effluvium: Secondary | ICD-10-CM | POA: Diagnosis not present

## 2020-10-14 DIAGNOSIS — L648 Other androgenic alopecia: Secondary | ICD-10-CM | POA: Diagnosis not present

## 2020-10-17 ENCOUNTER — Ambulatory Visit: Payer: Medicare PPO

## 2020-10-17 DIAGNOSIS — N993 Prolapse of vaginal vault after hysterectomy: Secondary | ICD-10-CM | POA: Diagnosis not present

## 2020-10-17 DIAGNOSIS — N8111 Cystocele, midline: Secondary | ICD-10-CM | POA: Diagnosis not present

## 2020-10-17 DIAGNOSIS — N816 Rectocele: Secondary | ICD-10-CM | POA: Diagnosis not present

## 2020-10-17 DIAGNOSIS — N952 Postmenopausal atrophic vaginitis: Secondary | ICD-10-CM | POA: Diagnosis not present

## 2020-10-17 DIAGNOSIS — Z4689 Encounter for fitting and adjustment of other specified devices: Secondary | ICD-10-CM | POA: Diagnosis not present

## 2020-12-15 DIAGNOSIS — H52203 Unspecified astigmatism, bilateral: Secondary | ICD-10-CM | POA: Diagnosis not present

## 2020-12-15 DIAGNOSIS — H5213 Myopia, bilateral: Secondary | ICD-10-CM | POA: Diagnosis not present

## 2020-12-15 DIAGNOSIS — E119 Type 2 diabetes mellitus without complications: Secondary | ICD-10-CM | POA: Diagnosis not present

## 2020-12-15 DIAGNOSIS — H2513 Age-related nuclear cataract, bilateral: Secondary | ICD-10-CM | POA: Diagnosis not present

## 2020-12-15 DIAGNOSIS — H524 Presbyopia: Secondary | ICD-10-CM | POA: Diagnosis not present

## 2020-12-15 LAB — HM DIABETES EYE EXAM

## 2020-12-29 ENCOUNTER — Other Ambulatory Visit: Payer: Self-pay | Admitting: Family Medicine

## 2021-01-05 ENCOUNTER — Other Ambulatory Visit: Payer: Self-pay

## 2021-01-05 ENCOUNTER — Ambulatory Visit: Payer: Medicare PPO | Admitting: Family Medicine

## 2021-01-05 ENCOUNTER — Encounter: Payer: Self-pay | Admitting: Family Medicine

## 2021-01-05 VITALS — BP 106/64 | HR 83 | Temp 97.5°F | Ht 66.0 in | Wt 145.0 lb

## 2021-01-05 DIAGNOSIS — E119 Type 2 diabetes mellitus without complications: Secondary | ICD-10-CM

## 2021-01-05 DIAGNOSIS — I10 Essential (primary) hypertension: Secondary | ICD-10-CM

## 2021-01-05 LAB — POCT GLYCOSYLATED HEMOGLOBIN (HGB A1C): Hemoglobin A1C: 8.3 % — AB (ref 4.0–5.6)

## 2021-01-05 MED ORDER — QUINAPRIL HCL 20 MG PO TABS
30.0000 mg | ORAL_TABLET | Freq: Every day | ORAL | Status: DC
Start: 2021-01-05 — End: 2021-03-23

## 2021-01-05 MED ORDER — LEVEMIR FLEXTOUCH 100 UNIT/ML ~~LOC~~ SOPN
PEN_INJECTOR | SUBCUTANEOUS | Status: DC
Start: 2021-01-05 — End: 2021-04-13

## 2021-01-05 NOTE — Assessment & Plan Note (Signed)
Recheck in 3 months.  Continue as is for now.  Sugars improved recently with inc in exercise and inc in insulin dose.  She agrees.  Continue metformin and insulin.

## 2021-01-05 NOTE — Progress Notes (Signed)
This visit occurred during the SARS-CoV-2 public health emergency.  Safety protocols were in place, including screening questions prior to the visit, additional usage of staff PPE, and extensive cleaning of exam room while observing appropriate contact time as indicated for disinfecting solutions.  Diabetes:  Using medications without difficulties:yes Hypoglycemic episodes:no Hyperglycemic episodes: up to 179, with inc in insulin noted.   Feet problems: normal sensation Blood Sugars averaging: ~125 recently, on higher dose of insulin eye exam within last year: yes, 12/15/20 She noted drier mouth recently.   Unclear source.  She had to increase her insulin up to 30 units in the meantime.    A1c d/w pt at OV.    She had seen dermatology about hair loss, w/o clear cause seen.    Lower BP noted.  occ lightheaded.  Cautions d/w pt.  D/w pt about dec quinapril, down to 30mg  a day.    She had URI for about 1 month around Christmas, that eventually got better.  She is back to walking on treadmill after she prev had some knee pain. She had tolerated walking for the last week.    Meds, vitals, and allergies reviewed.  ROS: Per HPI unless specifically indicated in ROS section   GEN: nad, alert and oriented HEENT:ncat NECK: supple w/o LA CV: rrr. PULM: ctab, no inc wob ABD: soft, +bs EXT: no edema SKIN: well perfused.

## 2021-01-05 NOTE — Assessment & Plan Note (Signed)
Lower BP noted.  occ lightheaded.  Cautions d/w pt.  D/w pt about dec quinapril, down to 30mg  a day.    Routed to cardiology as FYI and to scheduled cardiac f/u with patient.  Appreciate cards input.    She is walking w/o CP.

## 2021-01-05 NOTE — Patient Instructions (Addendum)
Cut back on quinapril, down to 1.5 tabs a day.  Update me as needed.  Take care.  Glad to see you. Thanks for your effort.   Plan on recheck in about 3 months with A1c like today.    I'll check with cardiology (Dr. Radford Pax) about follow up.

## 2021-01-14 ENCOUNTER — Other Ambulatory Visit: Payer: Self-pay | Admitting: Cardiovascular Disease

## 2021-01-15 DIAGNOSIS — N816 Rectocele: Secondary | ICD-10-CM | POA: Diagnosis not present

## 2021-01-15 DIAGNOSIS — T8389XA Other specified complication of genitourinary prosthetic devices, implants and grafts, initial encounter: Secondary | ICD-10-CM | POA: Diagnosis not present

## 2021-01-15 DIAGNOSIS — Z4689 Encounter for fitting and adjustment of other specified devices: Secondary | ICD-10-CM | POA: Diagnosis not present

## 2021-01-15 DIAGNOSIS — N993 Prolapse of vaginal vault after hysterectomy: Secondary | ICD-10-CM | POA: Diagnosis not present

## 2021-01-15 DIAGNOSIS — N952 Postmenopausal atrophic vaginitis: Secondary | ICD-10-CM | POA: Diagnosis not present

## 2021-01-15 DIAGNOSIS — N8111 Cystocele, midline: Secondary | ICD-10-CM | POA: Diagnosis not present

## 2021-01-15 DIAGNOSIS — N898 Other specified noninflammatory disorders of vagina: Secondary | ICD-10-CM | POA: Diagnosis not present

## 2021-01-20 ENCOUNTER — Other Ambulatory Visit: Payer: Self-pay | Admitting: Family Medicine

## 2021-02-23 DIAGNOSIS — Z1231 Encounter for screening mammogram for malignant neoplasm of breast: Secondary | ICD-10-CM | POA: Diagnosis not present

## 2021-02-23 LAB — HM MAMMOGRAPHY

## 2021-02-27 DIAGNOSIS — N8111 Cystocele, midline: Secondary | ICD-10-CM | POA: Diagnosis not present

## 2021-02-27 DIAGNOSIS — N952 Postmenopausal atrophic vaginitis: Secondary | ICD-10-CM | POA: Diagnosis not present

## 2021-02-27 DIAGNOSIS — N816 Rectocele: Secondary | ICD-10-CM | POA: Diagnosis not present

## 2021-02-27 DIAGNOSIS — Z4689 Encounter for fitting and adjustment of other specified devices: Secondary | ICD-10-CM | POA: Diagnosis not present

## 2021-02-27 DIAGNOSIS — N993 Prolapse of vaginal vault after hysterectomy: Secondary | ICD-10-CM | POA: Diagnosis not present

## 2021-03-23 ENCOUNTER — Other Ambulatory Visit: Payer: Self-pay | Admitting: Family Medicine

## 2021-03-25 ENCOUNTER — Other Ambulatory Visit: Payer: Self-pay | Admitting: Family Medicine

## 2021-04-10 ENCOUNTER — Ambulatory Visit: Payer: Medicare PPO | Admitting: Family Medicine

## 2021-04-13 ENCOUNTER — Other Ambulatory Visit: Payer: Self-pay

## 2021-04-13 ENCOUNTER — Ambulatory Visit: Payer: Medicare PPO | Admitting: Family Medicine

## 2021-04-13 ENCOUNTER — Encounter: Payer: Self-pay | Admitting: Family Medicine

## 2021-04-13 VITALS — BP 118/70 | HR 80 | Temp 97.8°F | Ht 66.0 in | Wt 147.0 lb

## 2021-04-13 DIAGNOSIS — I10 Essential (primary) hypertension: Secondary | ICD-10-CM

## 2021-04-13 DIAGNOSIS — E119 Type 2 diabetes mellitus without complications: Secondary | ICD-10-CM | POA: Diagnosis not present

## 2021-04-13 LAB — POCT GLYCOSYLATED HEMOGLOBIN (HGB A1C): Hemoglobin A1C: 8.4 % — AB (ref 4.0–5.6)

## 2021-04-13 MED ORDER — QUINAPRIL HCL 20 MG PO TABS: 30.0000 mg | ORAL_TABLET | Freq: Every day | ORAL | Status: DC

## 2021-04-13 MED ORDER — LEVEMIR FLEXTOUCH 100 UNIT/ML ~~LOC~~ SOPN: PEN_INJECTOR | SUBCUTANEOUS | Status: DC

## 2021-04-13 NOTE — Patient Instructions (Signed)
See about filling out the grid with pre and post meal sugars.   We'll go from there.  Plan on recheck in about 3 months with A1c at the visit.  Take care.  Glad to see you.

## 2021-04-13 NOTE — Progress Notes (Signed)
This visit occurred during the SARS-CoV-2 public health emergency.  Safety protocols were in place, including screening questions prior to the visit, additional usage of staff PPE, and extensive cleaning of exam room while observing appropriate contact time as indicated for disinfecting solutions.  Diabetes:  Using medications without difficulties: yes Hypoglycemic episodes: no Hyperglycemic episodes: see below.   Feet problems: no Blood Sugars averaging: ~130 in the AM. Lowest 86.   Metformin 1000mg  BID and insulin 38 units.   D/w pt about checking pre and post meal sugars.    She is taking quinapril 30mg  a daily.  BP reasonable and she is much less lightheaded (rarely now with position change).  Cautions d/w pt.  She could dec by another 10mg  if needed, d/w pt.   She had pessary f/u with urogyn.  She has f/u pending.  I will defer.  She agrees.  Meds, vitals, and allergies reviewed.  ROS: Per HPI unless specifically indicated in ROS section   GEN: nad, alert and oriented HEENT: ncat NECK: supple w/o LA CV: rrr. SEM noted (echo pending) PULM: ctab, no inc wob ABD: soft, +bs EXT: no edema SKIN: no acute rash

## 2021-04-15 NOTE — Assessment & Plan Note (Signed)
She is taking quinapril 30mg  a daily.  BP reasonable and she is much less lightheaded (rarely now with position change).  Cautions d/w pt.  She could dec by another 10mg  if needed, d/w pt.

## 2021-04-15 NOTE — Assessment & Plan Note (Signed)
Discussed options.  I asked her to fill out a grid with pre and post meal sugars.   We'll go from there.  We opted not to make any medication changes at this time. We may need to increase her insulin dose overall but decrease her p.m. metformin to try to limit nocturnal low sugars. Plan on recheck in about 3 months with A1c at the visit.

## 2021-04-20 ENCOUNTER — Encounter: Payer: Self-pay | Admitting: Family Medicine

## 2021-04-23 ENCOUNTER — Other Ambulatory Visit: Payer: Self-pay | Admitting: Family Medicine

## 2021-04-23 DIAGNOSIS — E119 Type 2 diabetes mellitus without complications: Secondary | ICD-10-CM

## 2021-05-12 ENCOUNTER — Other Ambulatory Visit: Payer: Self-pay | Admitting: Family Medicine

## 2021-05-18 ENCOUNTER — Other Ambulatory Visit (INDEPENDENT_AMBULATORY_CARE_PROVIDER_SITE_OTHER): Payer: Medicare PPO

## 2021-05-18 ENCOUNTER — Encounter: Payer: Self-pay | Admitting: Cardiology

## 2021-05-18 ENCOUNTER — Other Ambulatory Visit: Payer: Self-pay

## 2021-05-18 ENCOUNTER — Ambulatory Visit (HOSPITAL_COMMUNITY): Payer: Medicare PPO | Attending: Cardiology

## 2021-05-18 DIAGNOSIS — E119 Type 2 diabetes mellitus without complications: Secondary | ICD-10-CM | POA: Diagnosis not present

## 2021-05-18 DIAGNOSIS — I350 Nonrheumatic aortic (valve) stenosis: Secondary | ICD-10-CM | POA: Insufficient documentation

## 2021-05-18 LAB — ECHOCARDIOGRAM COMPLETE
AR max vel: 1.41 cm2
AV Area VTI: 1.48 cm2
AV Area mean vel: 1.34 cm2
AV Mean grad: 20.8 mmHg
AV Peak grad: 35.8 mmHg
Ao pk vel: 2.99 m/s
Area-P 1/2: 2.29 cm2
S' Lateral: 2.6 cm

## 2021-05-20 LAB — FRUCTOSAMINE: Fructosamine: 332 umol/L — ABNORMAL HIGH (ref 205–285)

## 2021-05-20 NOTE — Progress Notes (Signed)
Cardiology Note    Date:  05/21/2021   ID:  Gloria [name], DOB [birth year], MRN [id number]  PCP:  Tonia Ghent, MD  Cardiologist:  Fransico Him, MD   Chief Complaint  Patient presents with   Coronary Artery Disease   Hypertension   Hyperlipidemia    History of Present Illness:  Gloria [name] is a 75 y.o. female with a hx of DM2, heart murmur with normal echo in 2011 and HTN.  She also has ASAD with coronary CTA showing a markedly elevated Coronary Ca score of 6179 and severe heavily calcified multivessel CAD.  She underwent cardiac cath showing 50% prox to mid LAD, 40% mLCx, 80% OM3, 95% prox to mid RCA and underwent PCI of the RCA 02/2020.   SHe is here today for followup and is doing well.  SHe denies any chest pain or pressure, SOB, DOE (except for climbing more than 1 flight of stairs), PND, orthopnea, LE edema, dizziness, palpitations or syncope. She is compliant with her meds and is tolerating meds with no SE.   She walks 1.25 miles QOD with no problems.   Past Medical History:  Diagnosis Date   ABDOMINAL PAIN OTHER SPECIFIED SITE 08/27/2010   Qualifier: Diagnosis of  By: Damita Dunnings MD, Phillip Heal     Advance directive discussed with patient 01/03/2012   12/2011-AD d/w pt.  She has talked to husband but doesn't have living will yet.  Full code in discussion with patient today.  Would not want prolonged interventions if she were profoundly and/or permanently impaired, ie severe dementia or a condition with no hope of improvement.   01/13/15-Advance directive d/w pt- husband designated if patient were incapacitated.     Allergic rhinitis    Aortic stenosis    moderate by echo 05/2021 with mean AVG 57mHg   Diabetes mellitus without complication ([place] 083/38/2505  Qualifier: Diagnosis of  By: SCouncil MechanicMD, RHilaria Ota   Disorder of bone and cartilage 12/20/2007   2016 DXA: -1.4.  Consider repeat in 2021.      Exertional shortness of breath 01/02/2020   Female cystocele 08/10/2012    Health care maintenance 02/09/2018   Hemorrhoids    Hypertension    Left shoulder pain 07/20/2016   Medicare annual wellness visit, subsequent 01/03/2012   MENOPAUSE, SURGICAL 12/16/2009   Qualifier: Diagnosis of  By: LLurlean NannyLPN, Regina     Pure hypercholesterolemia 03/30/2011   RENAL CALCULUS 05/24/2007   Qualifier: Diagnosis of  By: SCouncil MechanicMD, RHilaria Ota   RHINITIS 12/24/2010   Qualifier: Diagnosis of  By: DDamita DunningsMD, Graham     Snoring 01/02/2020   Ureteral stone with hydronephrosis 08/02/2012    Past Surgical History:  Procedure Laterality Date   ABDOMINAL HYSTERECTOMY     BASAL CELL CARCINOMA EXCISION  08/23/2017   left upper arm   CARPAL TUNNEL RELEASE     with bilateral releases   CORONARY ATHERECTOMY N/A 02/26/2020   Procedure: CORONARY ATHERECTOMY;  Surgeon: CSherren Mocha MD;  [facility];  Service: Cardiovascular;  Laterality: N/A;   CYSTECTOMY     Left breast, right lumpectomy B9   INGUINAL HERNIA REPAIR     Right (Dr. YAnnamaria Boots   [place] AND [place]ARY [place]APHY N/A 02/22/2020   Procedure: LEFT HEART CATH AND CORONARY ANGIOGRAPHY;  Surgeon: CSherren Mocha MD;  [facility];  Service: Cardiovascular;  Laterality: N/A;   NSVD     X 2  TVH with vaginal prolapse  08/29/09   Transvag Tape w/Varitensor, Ant & Post Colporraphy, Uterosacral Lig susp, McCall Culdoplasty   ueteroscopy  11/01   without laser secondary stone   Urological procedure  10/22 - 08/31/09   [facility]    Current Medications: Current Meds  Medication Sig   ACCU-CHEK AVIVA PLUS test strip CHECK BLOOD SUGAR FOUR TIMES DAILY AS NEEDED   ACCU-CHEK FASTCLIX LANCETS MISC Use to check blood sugar 4 times daily as needed.  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   aspirin EC 81 MG tablet Take 1 tablet (81 mg total) by mouth daily.   atorvastatin (LIPITOR) 20 MG tablet TAKE 1 TABLET BY MOUTH DAILY   Blood Glucose Monitoring Suppl (ACCU-CHEK AVIVA PLUS) W/DEVICE KIT Use  to check blood sugar 4 times daily as needed.  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   clopidogrel (PLAVIX) 75 MG tablet TAKE ONE TABLET EVERY DAY   conjugated estrogens (PREMARIN) vaginal cream Place vaginally.   hydrochlorothiazide (HYDRODIURIL) 25 MG tablet TAKE ONE TABLET EVERY DAY   insulin detemir (LEVEMIR FLEXTOUCH) 100 UNIT/ML FlexPen INJECT 38 UNITS SUBCUTANEOUSLY AT BEDTIME   Insulin Pen Needle (B-D ULTRAFINE III SHORT PEN) 31G X 8 MM MISC USE AS DIRECTED  ** DUE FOR OFFICE VISIT/PHYSICAL   metFORMIN (GLUCOPHAGE) 1000 MG tablet TAKE 1 TABLET BY MOUTH TWICE DAILY WITH MEALS   Probiotic Product (ALIGN) 4 MG CAPS Take 4 mg by mouth daily.    Psyllium (METAMUCIL PO) Take 1 tablet by mouth daily as needed (Constipation).    quinapril (ACCUPRIL) 20 MG tablet Take 1.5 tablets (30 mg total) by mouth daily. (Patient taking differently: Take 20 mg by mouth daily.)   vitamin C (ASCORBIC ACID) 500 MG tablet Take 500 mg by mouth daily.    Allergies:   Sulfonamide derivatives, Ciprofloxin hcl [ciprofloxacin], and Penicillins   Social History   Socioeconomic History   Marital status: Married    Spouse name: Not on file   Number of children: 2   Years of education: Not on file   Highest education level: Not on file  Occupational History   Occupation: Retired Pharmacist, hospital (primary education) from the [employer]    Employer: RETIRED  Tobacco Use   Smoking status: Never   Smokeless tobacco: Never  Vaping Use   Vaping Use: Never used  Substance and Sexual Activity   Alcohol use: Yes    Alcohol/week: 2.0 - 3.0 standard drinks    Types: 2 - 3 Glasses of wine per week   Drug use: No   Sexual activity: Not Currently  Other Topics Concern   Not on file  Social History Narrative   Retired Pharmacist, hospital   Marital Status: Married, 1968   Children: 2 children out of the home   Social Determinants of  Health   Financial Resource Strain: Not on file  Food Insecurity: Not on file  Transportation Needs: Not on file  Physical Activity: Not on file  Stress: Not on file  Social Connections: Not on file     Family History:  The patient's family history includes Alcohol abuse in her cousin; Breast cancer in her cousin; Diabetes (age of onset: 43) in her paternal aunt; Heart disease in her mother, paternal uncle, and paternal uncle; Hypertension in her mother; Prostate cancer in her son; Stroke in her maternal aunt and paternal aunt; Thyroid disease in an other  family member.   ROS:   Please see the history of present illness.    ROS All other systems reviewed and are negative.  No flowsheet data found.  Cardiac Studies Reviewed:  Cardiac Cath 02/2020 Conclusion    Prox LAD to Mid LAD lesion is 50% stenosed. Mid Cx lesion is 40% stenosed. 3rd Mrg lesion is 80% stenosed. Prox RCA to Mid RCA lesion is 95% stenosed. A drug-eluting stent was successfully placed using a STENT RESOLUTE ONYX 3.5X12. Post intervention, there is a 0% residual stenosis.   Successful atherectomy and stenting of severe calcific stenosis of the mid right coronary artery, reducing the lesion from 95% to 0% with TIMI-3 flow both pre and post procedure.  2D echo 03/2020 IMPRESSIONS    1. Normal LV systolic function; grade 1 diastolic dysfunction; mild LVH;  mildly dilated ascending aorta; moderate AS (mean gradient 25 mmHg);  moderate LAE.   2. Left ventricular ejection fraction, by estimation, is 60 to 65%. The  left ventricle has normal function. The left ventricle has no regional  wall motion abnormalities. There is mild left ventricular hypertrophy.  Left ventricular diastolic parameters  are consistent with Grade I diastolic dysfunction (impaired relaxation).   3. Right ventricular systolic function is normal. The right ventricular  size is normal. There is mildly elevated pulmonary artery systolic   pressure.   4. Left atrial size was moderately dilated.   5. The mitral valve is normal in structure. Trivial mitral valve  regurgitation. No evidence of mitral stenosis.   6. The aortic valve has an indeterminant number of cusps. Aortic valve  regurgitation is not visualized. Moderate aortic valve stenosis.   7. The inferior vena cava is normal in size with greater than 50%  respiratory variability, suggesting right atrial pressure of 3 mmHg.     PHYSICAL EXAM:   VS:  BP 112/70 (BP Location: Left Arm, Patient Position: Sitting, Cuff Size: Normal)   Pulse 74   Ht _0  (1.676 m)   Wt 148 lb (67.1 kg)   LMP 08/08/2009   SpO2 95%   BMI 23.89 kg/m     GEN: Well nourished, well developed in no acute distress HEENT: Normal NECK: No JVD; carotid murmur that radiates from aortic valve LYMPHATICS: No lymphadenopathy CARDIAC:RRR, no rubs, gallops.  2/6 systolic murmur at RUSB to LLSB and carotid arteries RESPIRATORY:  Clear to auscultation without rales, wheezing or rhonchi  ABDOMEN: Soft, non-tender, non-distended MUSCULOSKELETAL:  No edema; No deformity  SKIN: Warm and dry NEUROLOGIC:  Alert and oriented x 3 PSYCHIATRIC:  Normal affect    Wt Readings from Last 3 Encounters:  05/21/21 148 lb (67.1 kg)  04/13/21 147 lb (66.7 kg)  01/05/21 145 lb (65.8 kg)      Studies/Labs Reviewed:   EKG:  EKG is ordered today and demonstrates NSR with low voltage QRS and no ST changes  Recent Labs: 10/06/2020: ALT 17; BUN 16; Creatinine, Ser 0.49; Hemoglobin 14.4; Platelets 200.0; Potassium 4.3; Sodium 140; TSH 0.47   Lipid Panel    Component Value Date/Time   CHOL 101 05/14/2020 0903   TRIG 59.0 05/14/2020 0903   HDL 31.10 (L) 05/14/2020 0903   CHOLHDL 3 05/14/2020 0903   VLDL 11.8 05/14/2020 0903   LDLCALC 58 05/14/2020 0903    Additional studies/ records that were reviewed today include:  Office notes from PCP, EKG   ASSESSMENT/PLAN:  In order of problems listed  above:  ASCAD - coronary CTA 2021 showed a  markedly elevated Coronary Ca score of 6179 and severe heavily calcified multivessel CAD.   -s/p cardiac cath showing 50% prox to mid LAD, 40% mLCx, 80% OM3, 95% prox to mid RCA and underwent PCI of the RCA. 02/2020 -she has not had any anginal sx since I saw her last -Continue prescription drug management with ASA 41m daily, Plavix 717mdaily and Atorvastatin 2065maily   2. Aortic Stenosis -2D echo for heart murmur showed moderate AS with mean AVG 74m62mand DI 0.45 in 2021 -repeat echo 05/2021 showed normal LVF with mild LVH, moderate LAE and moderate AS with mean AVG 20mm76mnd DI 0.54. -repeat echo 05/2022  3. DM type 2 -followed by PCP -continue metformin, statin and ACE I  4.  HTN -BP is well controlled on exam today -Continue prescription drug management with HCTZ 74mg 82my and Quinapril 20mg d87m -check BMET  5.  HLD -LDL goal < 70 -Continue prescription drug management with Atorvastatin 20mg da55m>refilled for 1 year -check FLP and ALT      Medication Adjustments/Labs and Tests Ordered: Current medicines are reviewed at length with the patient today.  Concerns regarding medicines are outlined above.  Medication changes, Labs and Tests ordered today are listed in the Patient Instructions below.  There are no Patient Instructions on file for this visit.   Signed, Donella Pascarella TuFransico Him14/2022 9:10 AM    [facility] [phone]

## 2021-05-21 ENCOUNTER — Ambulatory Visit: Payer: Medicare PPO | Admitting: Cardiology

## 2021-05-21 ENCOUNTER — Encounter: Payer: Self-pay | Admitting: Cardiology

## 2021-05-21 ENCOUNTER — Other Ambulatory Visit: Payer: Self-pay

## 2021-05-21 VITALS — BP 112/70 | HR 74 | Ht 66.0 in | Wt 148.0 lb

## 2021-05-21 DIAGNOSIS — I350 Nonrheumatic aortic (valve) stenosis: Secondary | ICD-10-CM | POA: Diagnosis not present

## 2021-05-21 DIAGNOSIS — E7800 Pure hypercholesterolemia, unspecified: Secondary | ICD-10-CM

## 2021-05-21 DIAGNOSIS — I10 Essential (primary) hypertension: Secondary | ICD-10-CM | POA: Diagnosis not present

## 2021-05-21 DIAGNOSIS — E119 Type 2 diabetes mellitus without complications: Secondary | ICD-10-CM

## 2021-05-21 DIAGNOSIS — I2510 Atherosclerotic heart disease of native coronary artery without angina pectoris: Secondary | ICD-10-CM | POA: Diagnosis not present

## 2021-05-21 LAB — LIPID PANEL
Chol/HDL Ratio: 3 ratio (ref 0.0–4.4)
Cholesterol, Total: 109 mg/dL (ref 100–199)
HDL: 36 mg/dL — ABNORMAL LOW (ref >39)
LDL Chol Calc (NIH): 55 mg/dL (ref 0–99)
Triglycerides: 96 mg/dL (ref 0–149)
VLDL Cholesterol Cal: 18 mg/dL (ref 5–40)

## 2021-05-21 LAB — COMPREHENSIVE METABOLIC PANEL
ALT: 16 IU/L (ref 0–32)
AST: 16 IU/L (ref 0–40)
Albumin/Globulin Ratio: 1.8 (ref 1.2–2.2)
Albumin: 4.3 g/dL (ref 3.7–4.7)
Alkaline Phosphatase: 98 IU/L (ref 44–121)
BUN/Creatinine Ratio: 23 (ref 12–28)
BUN: 13 mg/dL (ref 8–27)
Bilirubin Total: 1.5 mg/dL — ABNORMAL HIGH (ref 0.0–1.2)
CO2: 23 mmol/L (ref 20–29)
Calcium: 10.8 mg/dL — ABNORMAL HIGH (ref 8.7–10.3)
Chloride: 97 mmol/L (ref 96–106)
Creatinine, Ser: 0.56 mg/dL — ABNORMAL LOW (ref 0.57–1.00)
Globulin, Total: 2.4 g/dL (ref 1.5–4.5)
Glucose: 206 mg/dL — ABNORMAL HIGH (ref 65–99)
Potassium: 4.3 mmol/L (ref 3.5–5.2)
Sodium: 136 mmol/L (ref 134–144)
Total Protein: 6.7 g/dL (ref 6.0–8.5)
eGFR: 95 mL/min/{1.73_m2} (ref >59)

## 2021-05-21 MED ORDER — NITROGLYCERIN 0.4 MG SL SUBL: 0.4000 mg | SUBLINGUAL_TABLET | SUBLINGUAL | 3 refills | Status: DC | PRN

## 2021-05-21 MED ORDER — CLOPIDOGREL BISULFATE 75 MG PO TABS: 75.0000 mg | ORAL_TABLET | Freq: Every day | ORAL | 3 refills | Status: DC

## 2021-05-21 MED ORDER — ATORVASTATIN CALCIUM 20 MG PO TABS: 20.0000 mg | ORAL_TABLET | Freq: Every day | ORAL | 3 refills | Status: DC

## 2021-05-21 MED ORDER — HYDROCHLOROTHIAZIDE 25 MG PO TABS: 25.0000 mg | ORAL_TABLET | Freq: Every day | ORAL | 3 refills | Status: DC

## 2021-05-21 NOTE — Addendum Note (Signed)
Addended by: Mendel Ryder on: 05/21/2021 09:27 AM   Modules accepted: Orders

## 2021-05-21 NOTE — Patient Instructions (Addendum)
Medication Instructions:  Your physician recommends that you continue on your current medications as directed. Please refer to the Current Medication list given to you today.  *If you need a refill on your cardiac medications before your next appointment, please call your pharmacy*   Lab Work: Lipids, Cmet- Today   If you have labs (blood work) drawn today and your tests are completely normal, you will receive your results only by: [facility] (if you have MyChart) OR A paper copy in the mail If you have any lab test that is abnormal or we need to change your treatment, we will call you to review the results.   Testing/Procedures: Your physician has requested that you have an echocardiogram in 1 year  Echocardiography is a painless test that uses sound waves to create images of your heart. It provides your doctor with information about the size and shape of your heart and how well your heart's chambers and valves are working. This procedure takes approximately one hour. There are no restrictions for this procedure.    Follow-Up: At [facility], you and your health needs are our priority.  As part of our continuing mission to provide you with exceptional heart care, we have created designated Provider Care Teams.  These Care Teams include your primary Cardiologist (physician) and Advanced Practice Providers (APPs -  Physician Assistants and Nurse Practitioners) who all work together to provide you with the care you need, when you need it.  We recommend signing up for the patient portal called "MyChart".  Sign up information is provided on this After Visit Summary.  MyChart is used to connect with patients for Virtual Visits (Telemedicine).  Patients are able to view lab/test results, encounter notes, upcoming appointments, etc.  Non-urgent messages can be sent to your provider as well.   To learn more about what you can do with MyChart, go to [email or web address].    Your next  appointment:   12 month(s)  The format for your next appointment:   In Person  Provider:   You may see Fransico Him, MD or one of the following Advanced Practice Providers on your designated Care Team:   Melina Copa, PA-C Ermalinda Barrios, PA-C   Other Instructions None

## 2021-05-22 ENCOUNTER — Ambulatory Visit: Payer: Medicare PPO | Admitting: Cardiology

## 2021-05-24 ENCOUNTER — Telehealth: Payer: Self-pay | Admitting: Family Medicine

## 2021-05-24 NOTE — Telephone Encounter (Signed)
Please call pt about incidental calcium elevation.  Cardiology wanted this reviewed.  Likely not significant and likely from HCTZ.  Would check PTH and calcium level, vit D.  Please help her set up a nonfasting lab visit when possible in the next few weeks.    Separate issue-also please see notes on fructosamine level.  Thanks.

## 2021-05-26 NOTE — Telephone Encounter (Signed)
Spoke with patient about lab results and also note below. Patient is going to call back to make lab appt once she sees another doctor next week about possible upcoming surgery. She needs to know what her schedule will be like before making lab appt.

## 2021-06-01 DIAGNOSIS — N816 Rectocele: Secondary | ICD-10-CM | POA: Diagnosis not present

## 2021-06-01 DIAGNOSIS — N993 Prolapse of vaginal vault after hysterectomy: Secondary | ICD-10-CM | POA: Diagnosis not present

## 2021-06-01 DIAGNOSIS — N952 Postmenopausal atrophic vaginitis: Secondary | ICD-10-CM | POA: Diagnosis not present

## 2021-06-01 DIAGNOSIS — Z4689 Encounter for fitting and adjustment of other specified devices: Secondary | ICD-10-CM | POA: Diagnosis not present

## 2021-06-01 DIAGNOSIS — N8111 Cystocele, midline: Secondary | ICD-10-CM | POA: Diagnosis not present

## 2021-06-17 DIAGNOSIS — Z85828 Personal history of other malignant neoplasm of skin: Secondary | ICD-10-CM | POA: Diagnosis not present

## 2021-06-17 DIAGNOSIS — L570 Actinic keratosis: Secondary | ICD-10-CM | POA: Diagnosis not present

## 2021-06-17 DIAGNOSIS — D2261 Melanocytic nevi of right upper limb, including shoulder: Secondary | ICD-10-CM | POA: Diagnosis not present

## 2021-06-17 DIAGNOSIS — D2262 Melanocytic nevi of left upper limb, including shoulder: Secondary | ICD-10-CM | POA: Diagnosis not present

## 2021-06-17 DIAGNOSIS — D2271 Melanocytic nevi of right lower limb, including hip: Secondary | ICD-10-CM | POA: Diagnosis not present

## 2021-06-17 DIAGNOSIS — L821 Other seborrheic keratosis: Secondary | ICD-10-CM | POA: Diagnosis not present

## 2021-06-17 DIAGNOSIS — X32XXXA Exposure to sunlight, initial encounter: Secondary | ICD-10-CM | POA: Diagnosis not present

## 2021-06-26 ENCOUNTER — Other Ambulatory Visit: Payer: Self-pay

## 2021-06-26 ENCOUNTER — Other Ambulatory Visit (INDEPENDENT_AMBULATORY_CARE_PROVIDER_SITE_OTHER): Payer: Medicare PPO

## 2021-06-26 LAB — VITAMIN D 25 HYDROXY (VIT D DEFICIENCY, FRACTURES): VITD: 42.79 ng/mL (ref 30.00–100.00)

## 2021-06-29 LAB — PTH, INTACT AND CALCIUM
Calcium: 10.3 mg/dL (ref 8.6–10.4)
PTH: 52 pg/mL (ref 16–77)

## 2021-07-02 ENCOUNTER — Other Ambulatory Visit: Payer: Self-pay

## 2021-07-02 NOTE — Progress Notes (Signed)
error 

## 2021-07-12 ENCOUNTER — Encounter: Payer: Self-pay | Admitting: Family Medicine

## 2021-07-14 ENCOUNTER — Other Ambulatory Visit: Payer: Self-pay | Admitting: Family Medicine

## 2021-07-28 ENCOUNTER — Other Ambulatory Visit: Payer: Self-pay | Admitting: Family Medicine

## 2021-08-07 IMAGING — CT CT HEART MORP W/ CTA COR W/ SCORE W/ CA W/CM &/OR W/O CM
1 series · 12 of 20 positions shown, 15 images · non-contrast
Comparison: None.

Addendum:
CLINICAL DATA: Hx of chestpain and dyspnea. Family hx of thoracic
aortic aneurysm

EXAM:
Cardiac/Coronary  CTA
TECHNIQUE: The patient was scanned on a Siemens Somatoform go.Top scanner.

[Series 505: best diastolic hr cta coronary 0.60 · axial · 0.36mm/px · z∈[-1271,-1181]mm · 12 of 253 slices shown, 15 images]
[im 14/253  vessel]
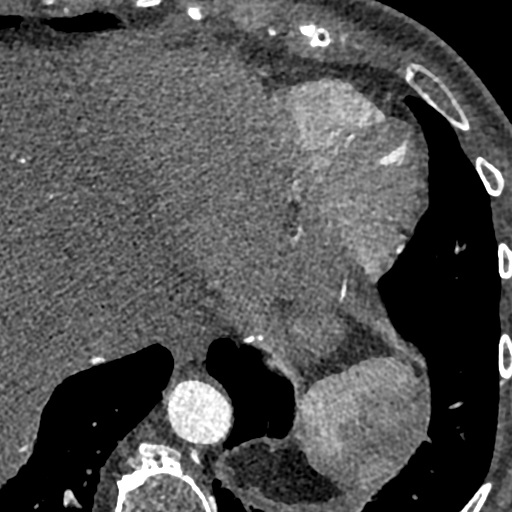
[im 14/253  lung]
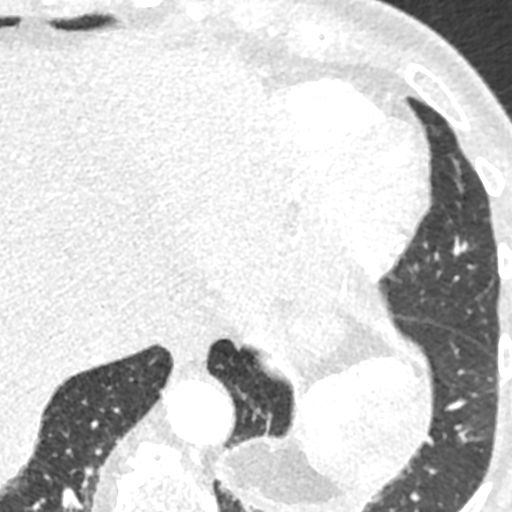
[im 40/253  vessel]
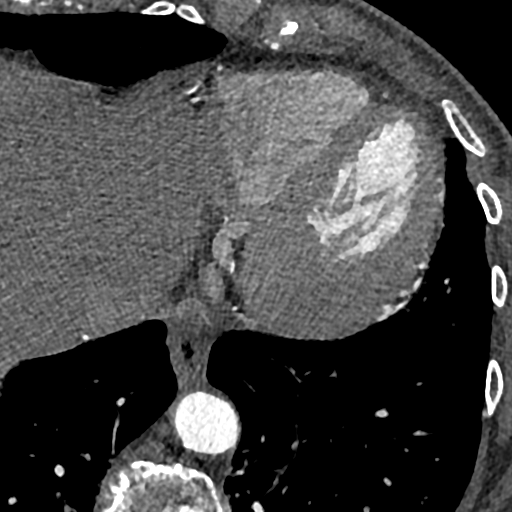
[im 54/253  vessel]
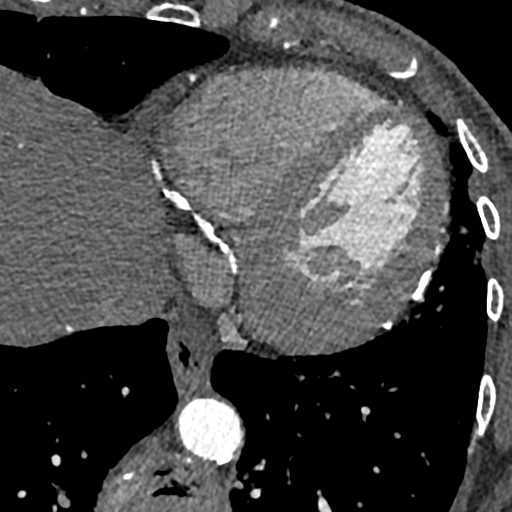
[im 80/253  vessel]
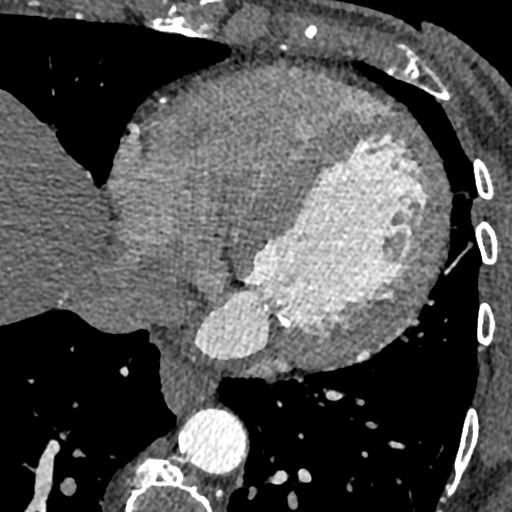
[im 93/253  vessel]
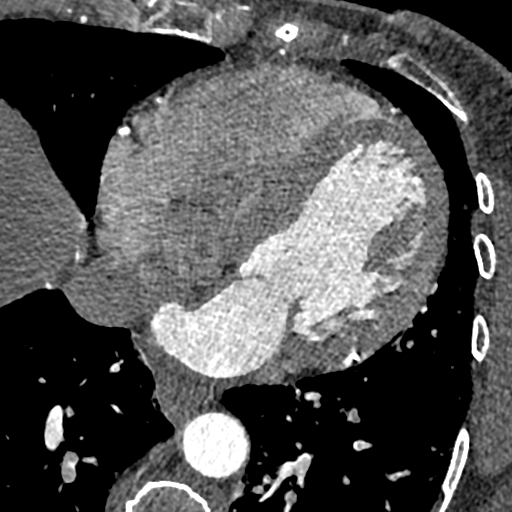
[im 93/253  lung]
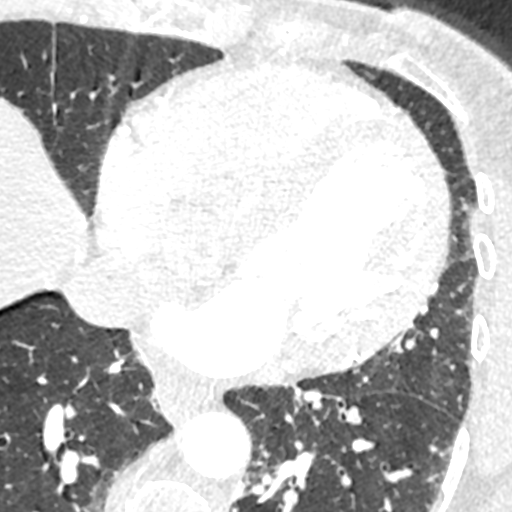
[im 120/253  vessel]
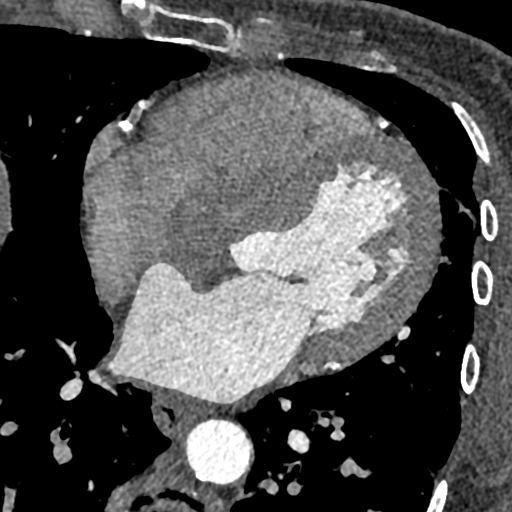
[im 133/253  vessel]
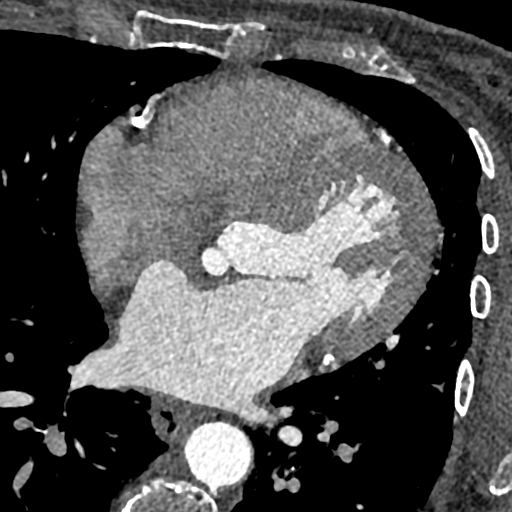
[im 160/253  vessel]
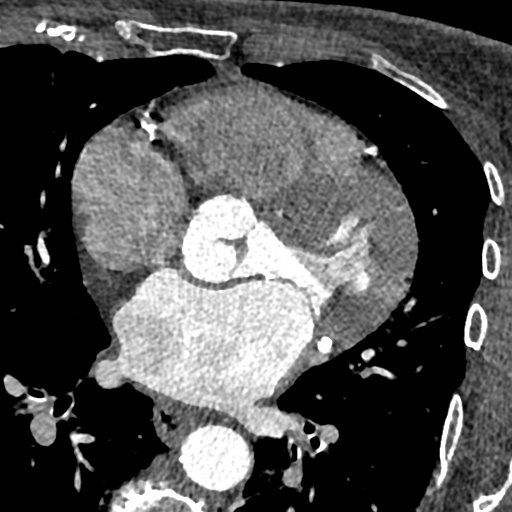
[im 173/253  vessel]
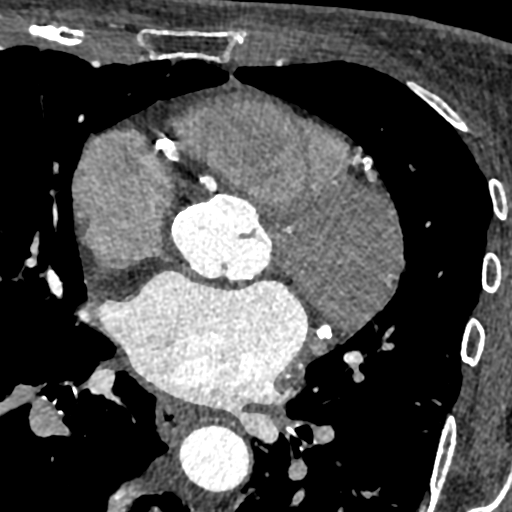
[im 173/253  lung]
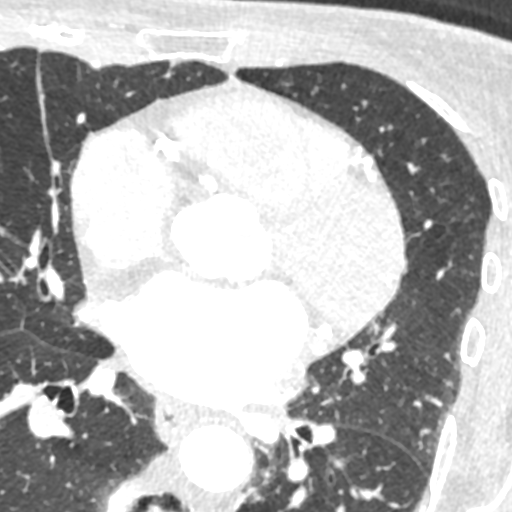
[im 199/253  vessel]
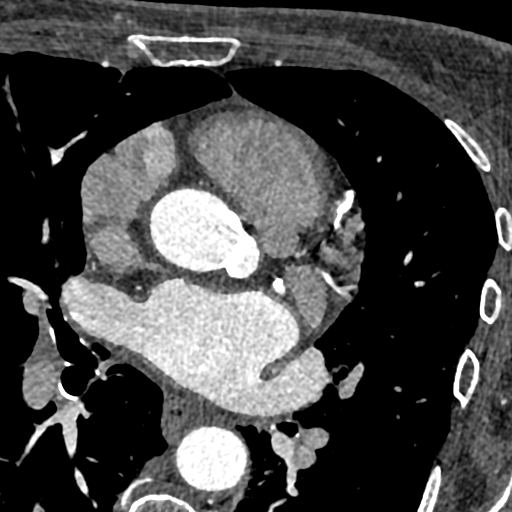
[im 213/253  vessel]
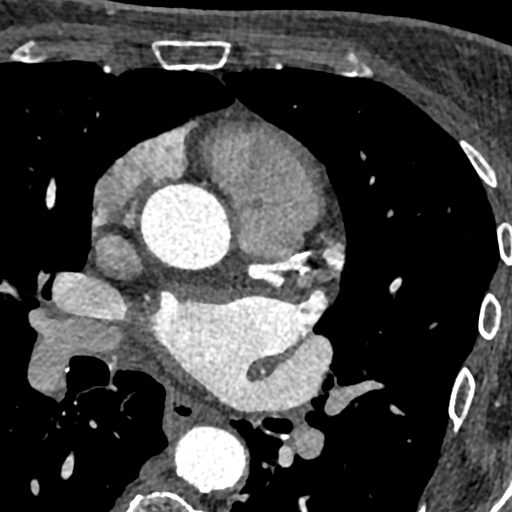
[im 239/253  vessel]
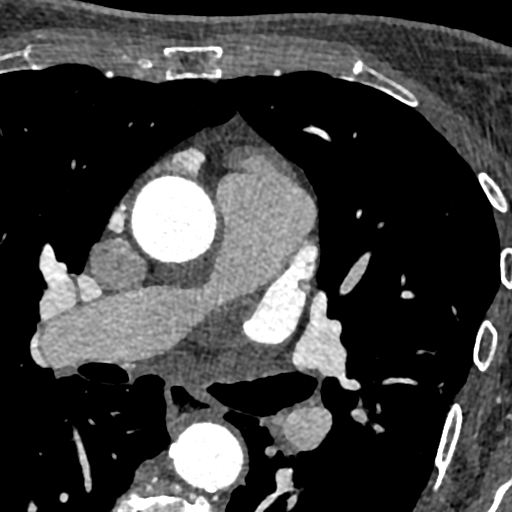

[12 of 20 positions shown; findings below may reference images not displayed]

FINDINGS: A retrospective scan was triggered in the descending thoracic aorta.
Axial non-contrast 3 mm slices were carried out through the heart.
The data set was analyzed on a dedicated work station and scored
using the Agatson method. Gantry rotation speed was 330 msecs and
collimation was .6 mm. 100mg of metoprolol po, 5mg iv and 0.8 mg of
sl NTG was given. The 3D data set was reconstructed in 5% intervals
of the 60-95 % of the R-R cycle. Diastolic phases were analyzed on a
dedicated work station using MPR, MIP and VRT modes. The patient
received 75 cc of contrast.

Aorta: Normal size. Acending and descending aortic wall
calcifications. No dissection.

Aortic Valve:  Trileaflet. mild calcifications.

Coronary Arteries:  Normal coronary origin.  Right dominance.

RCA is a large dominant artery that gives rise to PDA and PLA. There
is heavily calcified plaque thoughout the vessel causing severe
stenosis (>70% )

Left main is a large artery that gives rise to LAD and LCX arteries.

LAD is a large vessel that has heavily calcified plaque in the
proximal, mid and distal segments causing severe stenosis (>70% )

LCX is a non-dominant artery that gives rise to 2 obtuse marginal
branches. There is heavily calcified plaque in the proximal to mid
segments causing severe stenosis (>70% )

Other findings:

Normal pulmonary vein drainage into the left atrium.

Normal left atrial appendage without a thrombus.

Normal size of the pulmonary artery.
IMPRESSION: 1. Coronary calcium score of 8707. This was 99 percentile for age
and sex matched control.

2. Normal ascending aorta size

3. Normal coronary origin with right dominance.

4. Heavily calcified multivessel coronary artery disease causing
severe stenosis (>70%)

5. CAD-RADS 4 Severe stenosis. (70-99% or > 50% left main). Cardiac
catheterization or CT FFR is recommended. Consider symptom-guided
anti-ischemic pharmacotherapy as well as risk factor modification
per guideline directed care. Additional analysis with CT FFR will be
submitted and reported separately.

EXAM:
OVER-READ INTERPRETATION  CT CHEST

The following report is an over-read performed by radiologist Dr.
does not include interpretation of cardiac or coronary anatomy or
pathology. The coronary calcium score/coronary CTA interpretation by
the cardiologist is attached.
FINDINGS: Cardiovascular: Aortic atherosclerosis. Mild cardiac enlargement. No
pericardial effusion.

Mediastinum/nodes: No mass or adenopathy identified.

Lungs/pleura: Scarring identified within the right upper lobe and
right middle lobe.

Upper abdomen: No acute abnormality.

Musculoskeletal: Moderate multilevel thoracic degenerative disc
disease with vacuum disc and ventral endplate spurring.
IMPRESSION: 1. No mass or adenopathy identified.
2. Thoracic degenerative disc disease.

*** End of Addendum ***
FINDINGS: A retrospective scan was triggered in the descending thoracic aorta.
Axial non-contrast 3 mm slices were carried out through the heart.
The data set was analyzed on a dedicated work station and scored
using the Agatson method. Gantry rotation speed was 330 msecs and
collimation was .6 mm. 100mg of metoprolol po, 5mg iv and 0.8 mg of
sl NTG was given. The 3D data set was reconstructed in 5% intervals
of the 60-95 % of the R-R cycle. Diastolic phases were analyzed on a
dedicated work station using MPR, MIP and VRT modes. The patient
received 75 cc of contrast.

Aorta: Normal size. Acending and descending aortic wall
calcifications. No dissection.

Aortic Valve:  Trileaflet. mild calcifications.

Coronary Arteries:  Normal coronary origin.  Right dominance.

RCA is a large dominant artery that gives rise to PDA and PLA. There
is heavily calcified plaque thoughout the vessel causing severe
stenosis (>70% )

Left main is a large artery that gives rise to LAD and LCX arteries.

LAD is a large vessel that has heavily calcified plaque in the
proximal, mid and distal segments causing severe stenosis (>70% )

LCX is a non-dominant artery that gives rise to 2 obtuse marginal
branches. There is heavily calcified plaque in the proximal to mid
segments causing severe stenosis (>70% )

Other findings:

Normal pulmonary vein drainage into the left atrium.

Normal left atrial appendage without a thrombus.

Normal size of the pulmonary artery.
IMPRESSION: 1. Coronary calcium score of 8707. This was 99 percentile for age
and sex matched control.

2. Normal ascending aorta size

3. Normal coronary origin with right dominance.

4. Heavily calcified multivessel coronary artery disease causing
severe stenosis (>70%)

5. CAD-RADS 4 Severe stenosis. (70-99% or > 50% left main). Cardiac
catheterization or CT FFR is recommended. Consider symptom-guided
anti-ischemic pharmacotherapy as well as risk factor modification
per guideline directed care. Additional analysis with CT FFR will be
submitted and reported separately.

## 2021-08-13 ENCOUNTER — Other Ambulatory Visit: Payer: Self-pay | Admitting: Family Medicine

## 2021-08-25 DIAGNOSIS — N8111 Cystocele, midline: Secondary | ICD-10-CM | POA: Diagnosis not present

## 2021-08-25 DIAGNOSIS — N816 Rectocele: Secondary | ICD-10-CM | POA: Diagnosis not present

## 2021-08-25 DIAGNOSIS — N952 Postmenopausal atrophic vaginitis: Secondary | ICD-10-CM | POA: Diagnosis not present

## 2021-08-25 DIAGNOSIS — N993 Prolapse of vaginal vault after hysterectomy: Secondary | ICD-10-CM | POA: Diagnosis not present

## 2021-08-25 DIAGNOSIS — Z4689 Encounter for fitting and adjustment of other specified devices: Secondary | ICD-10-CM | POA: Diagnosis not present

## 2021-09-15 ENCOUNTER — Ambulatory Visit: Payer: Medicare PPO | Admitting: Family Medicine

## 2021-09-15 ENCOUNTER — Encounter: Payer: Self-pay | Admitting: Family Medicine

## 2021-09-15 ENCOUNTER — Other Ambulatory Visit: Payer: Self-pay

## 2021-09-15 VITALS — BP 120/66 | HR 87 | Temp 98.3°F | Ht 66.0 in | Wt 148.0 lb

## 2021-09-15 DIAGNOSIS — H6120 Impacted cerumen, unspecified ear: Secondary | ICD-10-CM

## 2021-09-15 DIAGNOSIS — M25511 Pain in right shoulder: Secondary | ICD-10-CM | POA: Diagnosis not present

## 2021-09-15 DIAGNOSIS — G8929 Other chronic pain: Secondary | ICD-10-CM | POA: Diagnosis not present

## 2021-09-15 DIAGNOSIS — E119 Type 2 diabetes mellitus without complications: Secondary | ICD-10-CM | POA: Diagnosis not present

## 2021-09-15 DIAGNOSIS — I10 Essential (primary) hypertension: Secondary | ICD-10-CM

## 2021-09-15 LAB — POCT GLYCOSYLATED HEMOGLOBIN (HGB A1C): Hemoglobin A1C: 7.6 % — AB (ref 4.0–5.6)

## 2021-09-15 MED ORDER — LEVEMIR FLEXTOUCH 100 UNIT/ML ~~LOC~~ SOPN: PEN_INJECTOR | SUBCUTANEOUS | 2 refills | Status: DC

## 2021-09-15 NOTE — Patient Instructions (Addendum)
Irrigate your ears as needed at home in the shower.  Don't use qtips.  We'll call about seeing ortho.  Take care.  Glad to see you.  Plan on a yearly visit in Feb 2023.

## 2021-09-15 NOTE — Progress Notes (Signed)
This visit occurred during the SARS-CoV-2 public health emergency.  Safety protocols were in place, including screening questions prior to the visit, additional usage of staff PPE, and extensive cleaning of exam room while observing appropriate contact time as indicated for disinfecting solutions.  Diabetes:  Using medications without difficulties: down to 45 units insulin and taking metformin AM and lunchtime.   Hypoglycemic episodes: no Hyperglycemic episodes: no Feet problems: no tingling or numbness.  Some occ hot feeling, some occ itch.   Blood Sugars averaging: usually ~100-140 eye exam within last year: yes A1c d/w pt at [facility].  7.6.   She felt better taking metformin at lunch instead of dinner.  No nighttime lows since the med change.  She is working on diet and exercise.    She is following up with surgery re: possible bladder tack.  I will defer.  She agrees.  Dec hearing L ear.  Feels like she is in a barrel.  No R ear sx. see exam.  She consented for cerumen removal with irrigation.  Hypertension.  She is on lower dose of quinapril now- 20mg  daily- gait is better and she feels better.  She isn't lightheaded.  Blood pressure is reasonable.  Discussed.  Shoulder pain, right.  Going on for >1 year.  Had seen ortho prev.  She had prev injection and did PT in the past.  More pain since summer 2022. Pain with ROM.  She restarted PT exercises at home and icing.  She is doing PT every day now but still having pain.  Pain with arm abduction, pain washing her hair.  ROM is worse in the AM.    Meds, vitals, and allergies reviewed.   ROS: Per HPI unless specifically indicated in ROS section   GEN: nad, alert and oriented HEENT: ncat NECK: supple w/o LA CV: rrr. PULM: ctab, no inc wob ABD: soft, +bs EXT: no edema SKIN: no acute rash Pain on R shoulder ROM and remainder of exam deferred due to patient discomfort.    Wax removed from L ear canal with irrigation with resolution after  getting consent.  Recheck TM normal.  She felt better and her hearing was better.  Diabetic foot exam: Normal inspection No skin breakdown No calluses  Normal DP pulses Normal sensation to light touch and monofilament Nails normal

## 2021-09-16 DIAGNOSIS — H6120 Impacted cerumen, unspecified ear: Secondary | ICD-10-CM | POA: Insufficient documentation

## 2021-09-16 NOTE — Assessment & Plan Note (Signed)
  Wax removed from L ear canal with irrigation with resolution after getting consent.  Recheck TM normal.  She felt better and her hearing was better.  She also has minimal cerumen in the right canal and she can take care of that with irrigation at home in the shower.

## 2021-09-16 NOTE — Assessment & Plan Note (Signed)
Refer to orthopedics.  She is using Tylenol as needed.

## 2021-09-16 NOTE — Assessment & Plan Note (Signed)
A1c reasonable at 7.6.  Continue insulin and metformin as is.  She will update me as needed.  Recheck periodically.

## 2021-09-16 NOTE — Assessment & Plan Note (Signed)
Continue on low-dose of quinapril.  She feels better.  See above.

## 2021-09-21 ENCOUNTER — Ambulatory Visit: Payer: Medicare PPO | Admitting: Family Medicine

## 2021-09-22 ENCOUNTER — Encounter: Payer: Self-pay | Admitting: Family Medicine

## 2021-09-22 IMAGING — DX DG CERVICAL SPINE COMPLETE 4+V
6 series · 6 of 6 positions shown · non-contrast
Comparison: None.

CLINICAL DATA: Right arm pain

EXAM:
CERVICAL SPINE - COMPLETE 4+ VIEW

[c-spine lat]
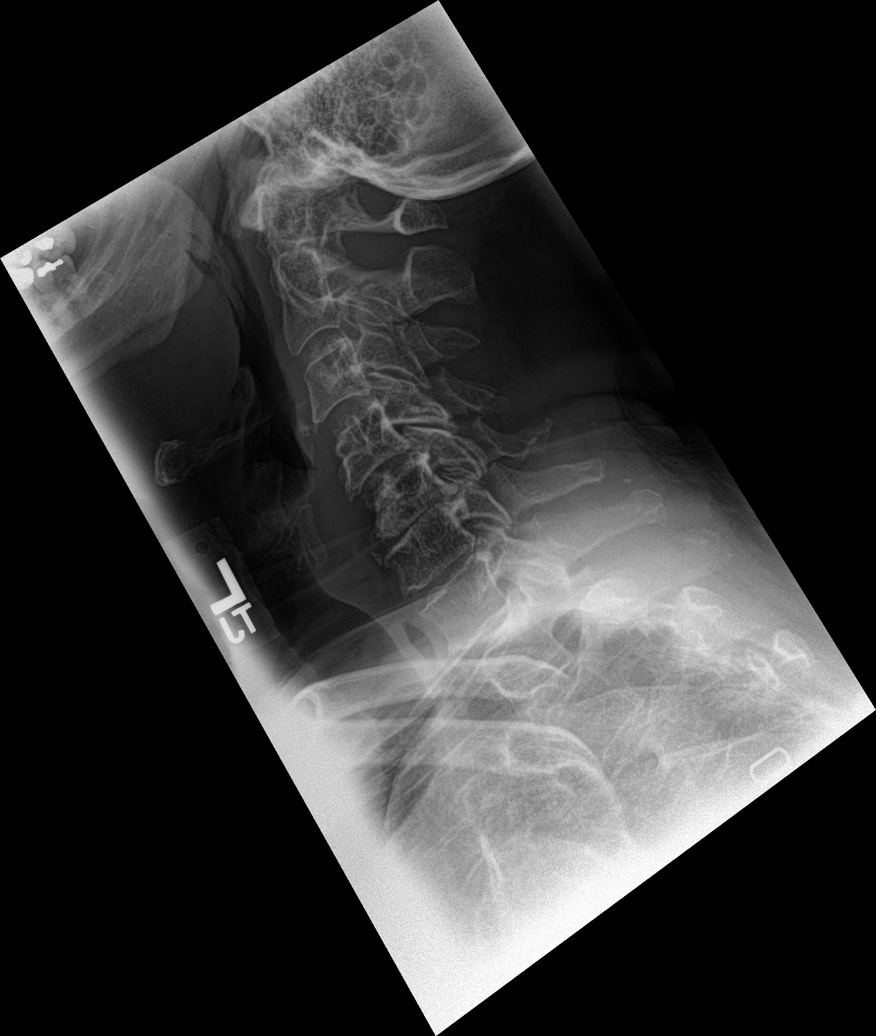

[c-spine obl (1 of 2)]
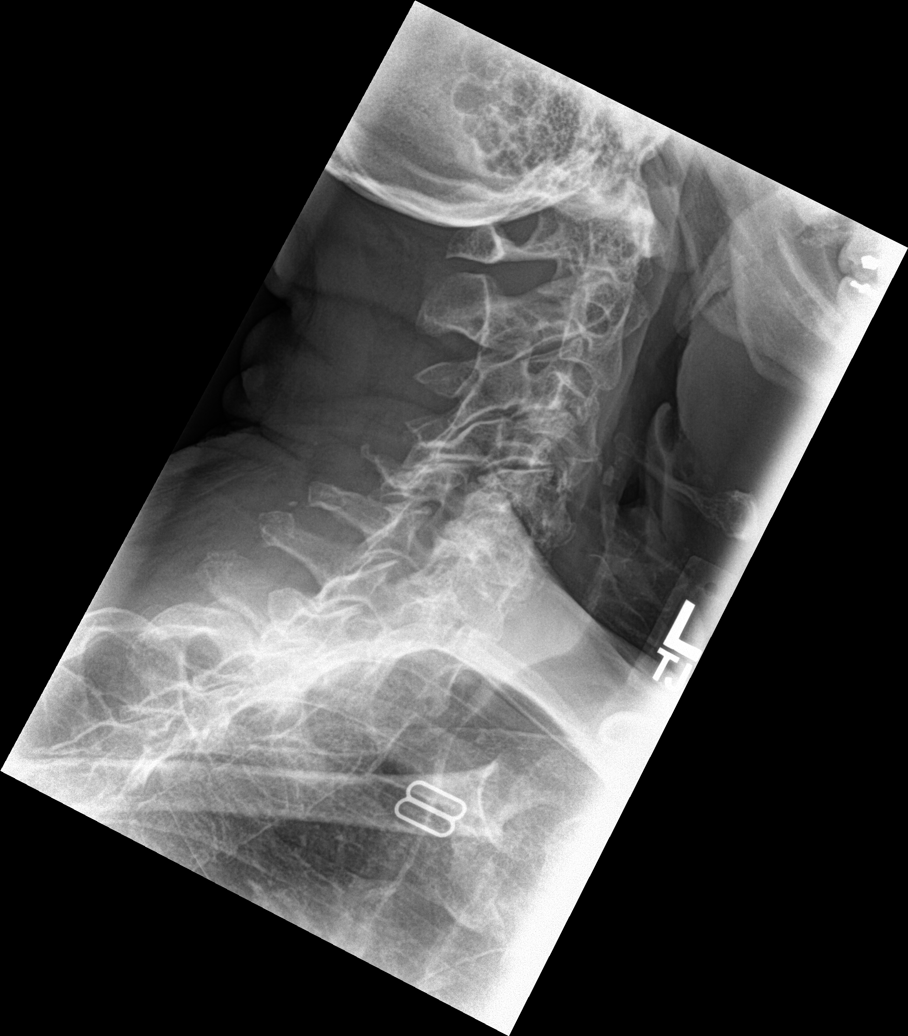

[c-spine obl (2 of 2)]
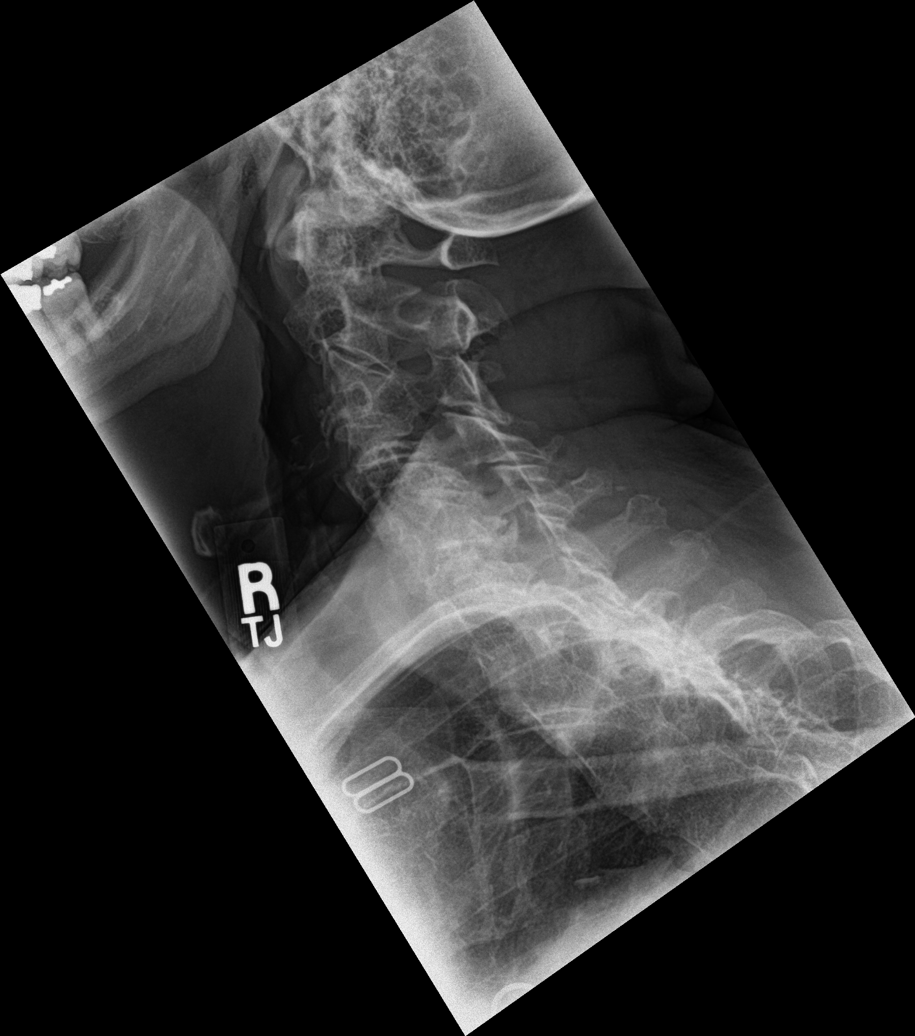

[c-spine ap]
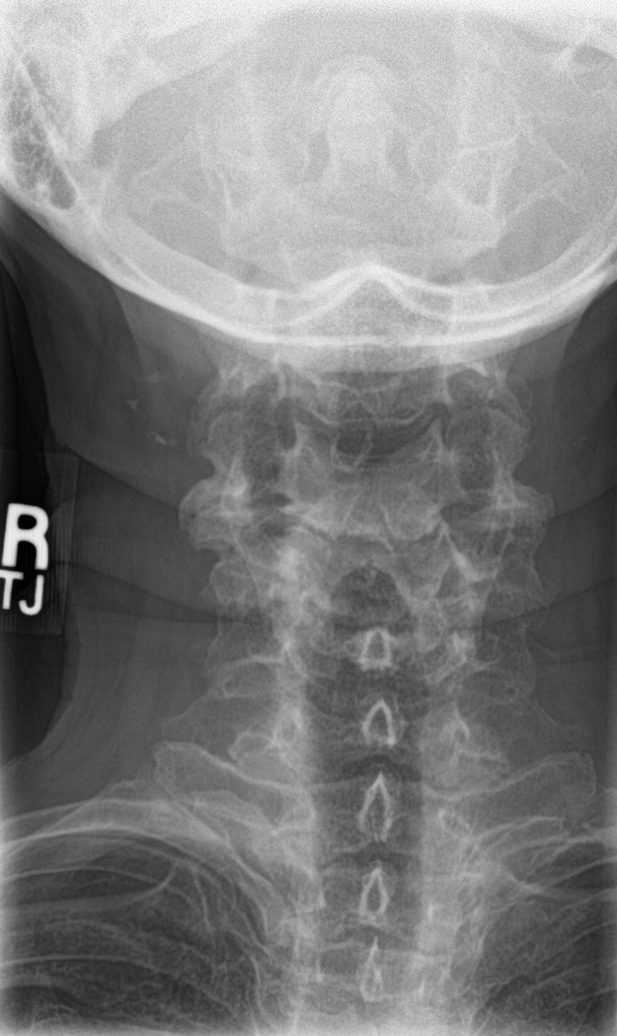

[c-spine open mouth]
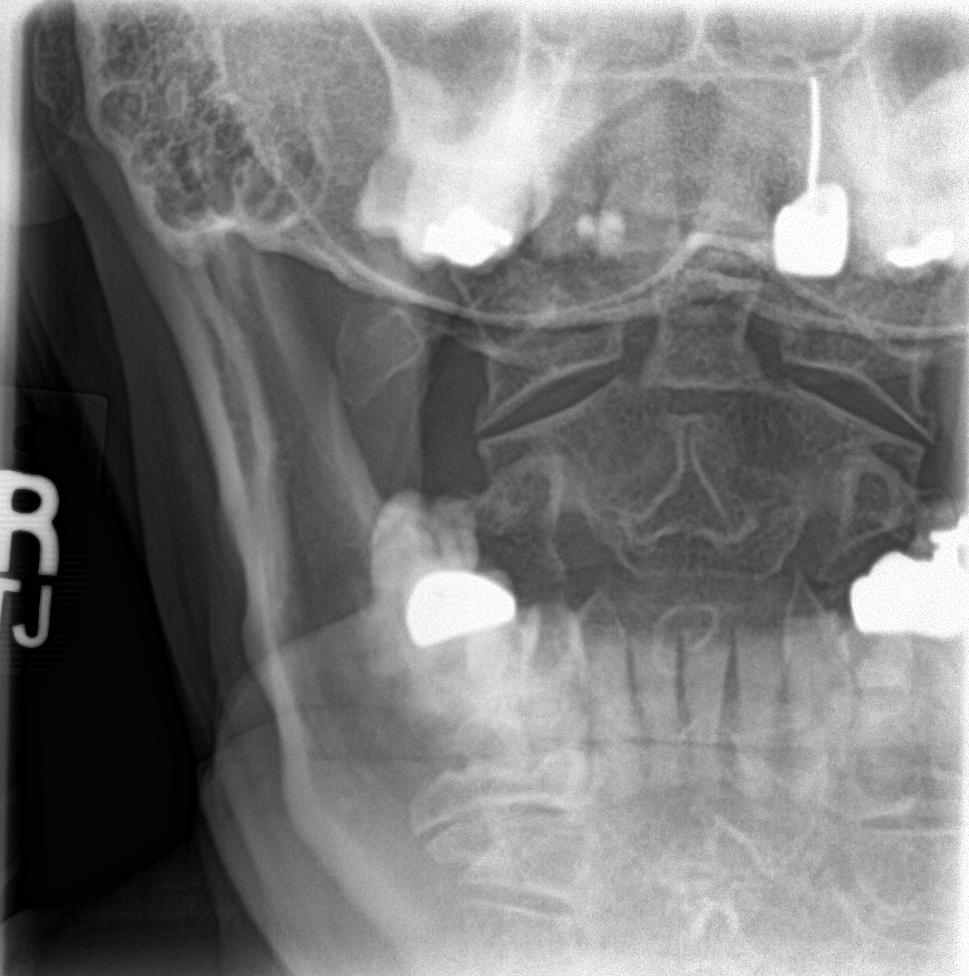

[c-spine swimmers]
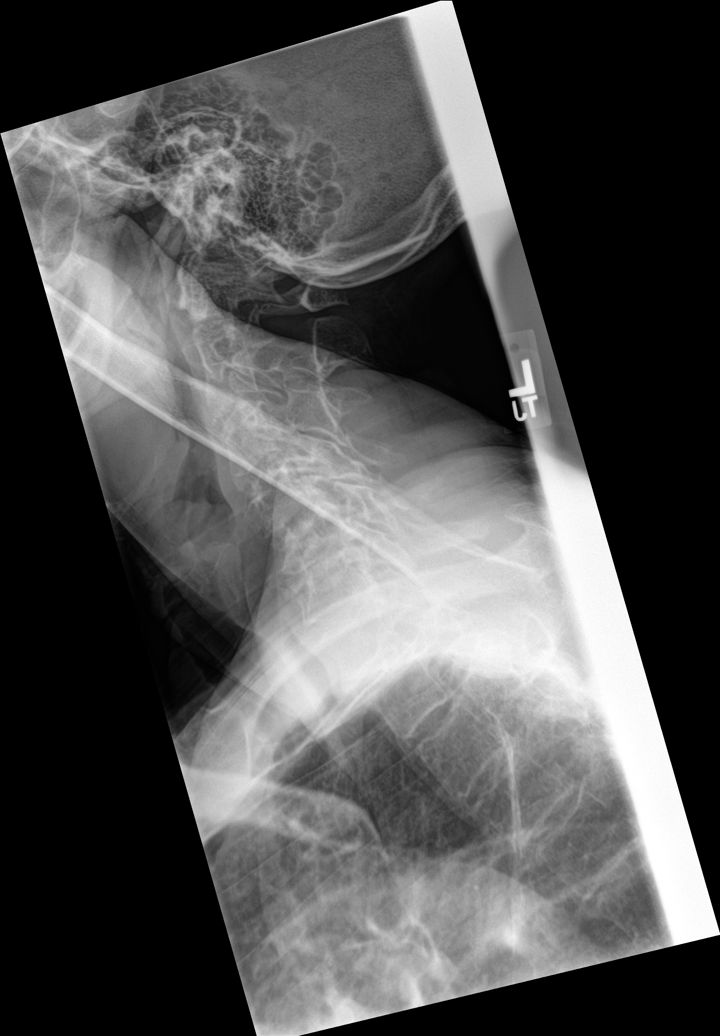

[6 of 6 positions shown; findings below may reference images not displayed]

FINDINGS: Mild anterolisthesis C4-5.  Associated disc and facet degeneration.

3 mm retrolisthesis C5-6. Severe disc degeneration and prominent
posterior spurring

Disc degeneration and spurring at C6-7.

Negative for fracture or mass.  Prevertebral soft tissues normal.

Moderate right foraminal narrowing at C4-5 and C5-6 and C6-7 due to
spurring. Moderate left foraminal narrowing at C5-6 and C6-7 due to
spurring. Mild left foraminal narrowing C3-4.
IMPRESSION: Multilevel spondylosis in the cervical spine. Multilevel foraminal
encroachment due to spurring as above.

## 2021-10-13 DIAGNOSIS — N993 Prolapse of vaginal vault after hysterectomy: Secondary | ICD-10-CM | POA: Diagnosis not present

## 2021-10-13 DIAGNOSIS — N8111 Cystocele, midline: Secondary | ICD-10-CM | POA: Diagnosis not present

## 2021-10-19 ENCOUNTER — Other Ambulatory Visit: Payer: Self-pay | Admitting: Family Medicine

## 2021-11-06 DIAGNOSIS — N993 Prolapse of vaginal vault after hysterectomy: Secondary | ICD-10-CM | POA: Diagnosis not present

## 2021-11-06 DIAGNOSIS — N8111 Cystocele, midline: Secondary | ICD-10-CM | POA: Diagnosis not present

## 2021-11-06 DIAGNOSIS — Z4689 Encounter for fitting and adjustment of other specified devices: Secondary | ICD-10-CM | POA: Diagnosis not present

## 2021-11-06 DIAGNOSIS — N816 Rectocele: Secondary | ICD-10-CM | POA: Diagnosis not present

## 2021-11-06 DIAGNOSIS — N952 Postmenopausal atrophic vaginitis: Secondary | ICD-10-CM | POA: Diagnosis not present

## 2021-11-09 ENCOUNTER — Encounter: Payer: Self-pay | Admitting: Cardiology

## 2021-11-10 DIAGNOSIS — M25811 Other specified joint disorders, right shoulder: Secondary | ICD-10-CM | POA: Diagnosis not present

## 2021-11-10 DIAGNOSIS — M25511 Pain in right shoulder: Secondary | ICD-10-CM | POA: Diagnosis not present

## 2021-11-11 ENCOUNTER — Other Ambulatory Visit: Payer: Self-pay | Admitting: Orthopedic Surgery

## 2021-11-11 DIAGNOSIS — M25511 Pain in right shoulder: Secondary | ICD-10-CM

## 2021-11-12 ENCOUNTER — Telehealth: Payer: Self-pay | Admitting: *Deleted

## 2021-11-12 NOTE — Telephone Encounter (Signed)
° °  Pre-operative Risk Assessment    Patient Name: Gloria [name]  DOB: 12/29/1945 MRN: [id number]      Request for Surgical Clearance    Procedure:   RIGHT REVERSE SHOULDER ARTHROPLASTY  Date of Surgery:  Clearance TBD                                 Surgeon:  DR. Victorino December Surgeon's Group or Practice Name:  [facility] Phone number:  [phone] Fax number:  [phone] ATTN: KERRI MAZE   Type of Clearance Requested:   - Medical  - Pharmacy:  Hold Clopidogrel (Plavix)     Type of Anesthesia:   CHOICE   Additional requests/questions:    Jiles Prows   11/12/2021, 5:59 PM

## 2021-11-13 ENCOUNTER — Other Ambulatory Visit: Payer: Self-pay | Admitting: Orthopedic Surgery

## 2021-11-13 DIAGNOSIS — M25511 Pain in right shoulder: Secondary | ICD-10-CM

## 2021-11-13 NOTE — Telephone Encounter (Signed)
Gloria [name] 76 year old female is requesting preoperative cardiac evaluation for right reverse shoulder arthroplasty.  She was last seen in the clinic on 05/21/2021.  During that time she continued to do well.  She underwent LHC with PCI and DES to her mid RCA 4/21.  She was continued on her dual antiplatelet therapy and atorvastatin.  Her follow-up echocardiogram for aortic stenosis showed moderate AS and is due to be repeated 7/23.  Her PMH includes CAD, aortic stenosis, type 2 diabetes, HTN, and HLD.   May her Plavix be held prior to her procedure?   Thank you for your help.  Please direct your response to CV [facility] preop will.  Jossie Ng. Royale Swamy NP-C    11/13/2021, 11:42 AM [place] [address] Office [phone] Fax [phone]

## 2021-11-16 NOTE — Telephone Encounter (Signed)
° °  Primary Cardiologist: Fransico Him, MD  Chart reviewed as part of pre-operative protocol coverage. Given past medical history and time since last visit, based on ACC/AHA guidelines, Gloria [name] would be at acceptable risk for the planned procedure without further cardiovascular testing.   Her RCRI is a class III risk, 6.6% risk of major cardiac event.  She is able to complete greater than 4 METS of physical activity.  Her Plavix may be held for 5 days prior to her procedure.  Please resume as soon as hemostasis is achieved.  Patient was advised that if she develops new symptoms prior to surgery to contact our office to arrange a follow-up appointment.  She verbalized understanding.  I will route this recommendation to the requesting party via Epic fax function and remove from pre-op pool.  Please call with questions.     Jossie Ng. Sherine Cortese NP-C    11/16/2021, 7:32 AM [place] [address] Office [phone] Fax [phone]

## 2021-11-24 NOTE — Patient Instructions (Signed)
DUE TO COVID-19 ONLY ONE VISITOR IS ALLOWED TO COME WITH YOU AND STAY IN THE WAITING ROOM ONLY DURING PRE OP AND PROCEDURE.   **NO VISITORS ARE ALLOWED IN THE SHORT STAY AREA OR RECOVERY ROOM!!**  IF YOU WILL BE ADMITTED INTO THE HOSPITAL YOU ARE ALLOWED ONLY TWO SUPPORT PEOPLE DURING VISITATION HOURS ONLY (7 AM -8PM)   The support person(s) must pass our screening, gel in and out, and wear a mask at all times, including in the patients room. Patients must also wear a mask when staff or their support person are in the room. Visitors GUEST BADGE MUST BE WORN VISIBLY  One adult visitor may remain with you overnight and MUST be in the room by 8 P.M.  No visitors under the age of 63. Any visitor under the age of 49 must be accompanied by an adult.        Your procedure is scheduled on: 12/18/21   Report to [facility] Main Entrance    Report to admitting at : 10:45 AM   Call this number if you have problems the morning of surgery [phone]   Do not eat food :After Midnight.   May have liquids until : 10:30 AM   day of surgery  CLEAR LIQUID DIET  Foods Allowed                                                                     Foods Excluded  Water, Black Coffee and tea, regular and decaf                             liquids that you cannot  Plain Jell-O in any flavor  (No red)                                           see through such as: Fruit ices (not with fruit pulp)                                     milk, soups, orange juice              Iced Popsicles (No red)                                    All solid food                                   Apple juices Sports drinks like Gatorade (No red) Lightly seasoned clear broth or consume(fat free) Sugar Sample Menu Breakfast                                Lunch  Supper Cranberry juice                    Beef broth                            Chicken broth Jell-O                                      Grape juice                           Apple juice Coffee or tea                        Jell-O                                      Popsicle                                                Coffee or tea                        Coffee or tea   Complete one Ensure drink the morning of surgery 3 hours prior to scheduled surgery at: 10:30 AM.    The day of surgery:  Drink ONE (1) Pre-Surgery Clear Ensure or G2 at AM the morning of surgery. Drink in one sitting. Do not sip.  This drink was given to you during your hospital  pre-op appointment visit. Nothing else to drink after completing the  Pre-Surgery Clear Ensure or G2.          If you have questions, please contact your surgeons office. Oral Hygiene is also important to reduce your risk of infection.                                    Remember - BRUSH YOUR TEETH THE MORNING OF SURGERY WITH YOUR REGULAR TOOTHPASTE   Do NOT smoke after Midnight   Take these medicines the morning of surgery with A SIP OF WATER:  How to Manage Your Diabetes Before and After Surgery  Why is it important to control my blood sugar before and after surgery? Improving blood sugar levels before and after surgery helps healing and can limit problems. A way of improving blood sugar control is eating a healthy diet by:  Eating less sugar and carbohydrates  Increasing activity/exercise  Talking with your doctor about reaching your blood sugar goals High blood sugars (greater than 180 mg/dL) can raise your risk of infections and slow your recovery, so you will need to focus on controlling your diabetes during the weeks before surgery. Make sure that the doctor who takes care of your diabetes knows about your planned surgery including the date and location.  How do I manage my blood sugar before surgery? Check your blood sugar at least 4 times a day, starting 2 days before surgery, to make sure that the level is not too high or low. Check your blood sugar  the morning of your surgery when you wake up and every 2 hours until you get to the Short Stay unit. If your blood sugar is less than 70 mg/dL, you will need to treat for low blood sugar: Do not take insulin. Treat a low blood sugar (less than 70 mg/dL) with  cup of clear juice (cranberry or apple), 4 glucose tablets, OR glucose gel. Recheck blood sugar in 15 minutes after treatment (to make sure it is greater than 70 mg/dL). If your blood sugar is not greater than 70 mg/dL on recheck, call [phone] for further instructions. Report your blood sugar to the short stay nurse when you get to Short Stay.  If you are admitted to the hospital after surgery: Your blood sugar will be checked by the staff and you will probably be given insulin after surgery (instead of oral diabetes medicines) to make sure you have good blood sugar levels. The goal for blood sugar control after surgery is 80-180 mg/dL.   WHAT DO I DO ABOUT MY DIABETES MEDICATION?  Do not take oral diabetes medicines (pills) the morning of surgery.  THE NIGHT BEFORE SURGERY, take ONLY half of the levemir insulin dose.Take metformin as usual.      THE MORNING OF SURGERY, DO NOT TAKE ANY ORAL DIABETIC MEDICATIONS DAY OF YOUR SURGERY                              You may not have any metal on your body including hair pins, jewelry, and body piercing             Do not wear make-up, lotions, powders, perfumes/cologne, or deodorant  Do not wear nail polish including gel and S&S, artificial/acrylic nails, or any other type of covering on natural nails including finger and toenails. If you have artificial nails, gel coating, etc. that needs to be removed by a nail salon please have this removed prior to surgery or surgery may need to be canceled/ delayed if the surgeon/ anesthesia feels like they are unable to be safely monitored.   Do not shave  48 hours prior to surgery.    Do not bring valuables to the hospital. [place].   Contacts, dentures or bridgework may not be worn into surgery.   Bring small overnight bag day of surgery.    Patients discharged on the day of surgery will not be allowed to drive home.  Someone needs to stay with you for the first 24 hours after anesthesia.   Special Instructions: Bring a copy of your healthcare power of attorney and living will documents         the day of surgery if you haven't scanned them before.              Please read over the following fact sheets you were given: IF YOU HAVE QUESTIONS ABOUT YOUR PRE-OP INSTRUCTIONS PLEASE CALL [phone]     [facility] - Preparing for Surgery Before surgery, you can play an important role.  Because skin is not sterile, your skin needs to be as free of germs as possible.  You can reduce the number of germs on your skin by washing with CHG (chlorahexidine gluconate) soap before surgery.  CHG is an antiseptic cleaner which kills germs and bonds with the skin to continue killing germs  even after washing. Please DO NOT use if you have an allergy to CHG or antibacterial soaps.  If your skin becomes reddened/irritated stop using the CHG and inform your nurse when you arrive at Short Stay. Do not shave (including legs and underarms) for at least 48 hours prior to the first CHG shower.  You may shave your face/neck. Please follow these instructions carefully:  1.  Shower with CHG Soap the night before surgery and the  morning of Surgery.  2.  If you choose to wash your hair, wash your hair first as usual with your  normal  shampoo.  3.  After you shampoo, rinse your hair and body thoroughly to remove the  shampoo.                           4.  Use CHG as you would any other liquid soap.  You can apply chg directly  to the skin and wash                       Gently with a scrungie or clean washcloth.  5.  Apply the CHG Soap to your body ONLY FROM THE NECK DOWN.   Do not use on face/ open                            Wound or open sores. Avoid contact with eyes, ears mouth and genitals (private parts).                       Wash face,  Genitals (private parts) with your normal soap.             6.  Wash thoroughly, paying special attention to the area where your surgery  will be performed.  7.  Thoroughly rinse your body with warm water from the neck down.  8.  DO NOT shower/wash with your normal soap after using and rinsing off  the CHG Soap.                9.  Pat yourself dry with a clean towel.            10.  Wear clean pajamas.            11.  Place clean sheets on your bed the night of your first shower and do not  sleep with pets. Day of Surgery : Do not apply any lotions/deodorants the morning of surgery.  Please wear clean clothes to the hospital/surgery center.  FAILURE TO FOLLOW THESE INSTRUCTIONS MAY RESULT IN THE CANCELLATION OF YOUR SURGERY PATIENT SIGNATURE_________________________________  NURSE SIGNATURE__________________________________  ________________________________________________________________________ [facility]- Preparing for Total Shoulder Arthroplasty    Before surgery, you can play an important role. Because skin is not sterile, your skin needs to be as free of germs as possible. You can reduce the number of germs on your skin by using the following products. Benzoyl Peroxide Gel Reduces the number of germs present on the skin Applied twice a day to shoulder area starting two days before surgery    ==================================================================  Please follow these instructions carefully:  BENZOYL PEROXIDE 5% GEL  Please do not use if you have an allergy to benzoyl peroxide.   If your skin becomes reddened/irritated stop using the benzoyl peroxide.  Starting two days before surgery, apply as follows: Apply benzoyl peroxide in the  morning and at night. Apply after taking a shower. If you are not taking a shower clean entire  shoulder front, back, and side along with the armpit with a clean wet washcloth.  Place a quarter-sized dollop on your shoulder and rub in thoroughly, making sure to cover the front, back, and side of your shoulder, along with the armpit.   2 days before ____ AM   ____ PM              1 day before ____ AM   ____ PM                         Do this twice a day for two days.  (Last application is the night before surgery, AFTER using the CHG soap as described below).  Do NOT apply benzoyl peroxide gel on the day of surgery.   Incentive Spirometer  An incentive spirometer is a tool that can help keep your lungs clear and active. This tool measures how well you are filling your lungs with each breath. Taking long deep breaths may help reverse or decrease the chance of developing breathing (pulmonary) problems (especially infection) following: A long period of time when you are unable to move or be active. BEFORE THE PROCEDURE  If the spirometer includes an indicator to show your best effort, your nurse or respiratory therapist will set it to a desired goal. If possible, sit up straight or lean slightly forward. Try not to slouch. Hold the incentive spirometer in an upright position. INSTRUCTIONS FOR USE  Sit on the edge of your bed if possible, or sit up as far as you can in bed or on a chair. Hold the incentive spirometer in an upright position. Breathe out normally. Place the mouthpiece in your mouth and seal your lips tightly around it. Breathe in slowly and as deeply as possible, raising the piston or the ball toward the top of the column. Hold your breath for 3-5 seconds or for as long as possible. Allow the piston or ball to fall to the bottom of the column. Remove the mouthpiece from your mouth and breathe out normally. Rest for a few seconds and repeat Steps 1 through 7 at least 10 times every 1-2 hours when you are awake. Take your time and take a few normal breaths between deep  breaths. The spirometer may include an indicator to show your best effort. Use the indicator as a goal to work toward during each repetition. After each set of 10 deep breaths, practice coughing to be sure your lungs are clear. If you have an incision (the cut made at the time of surgery), support your incision when coughing by placing a pillow or rolled up towels firmly against it. Once you are able to get out of bed, walk around indoors and cough well. You may stop using the incentive spirometer when instructed by your caregiver.  RISKS AND COMPLICATIONS Take your time so you do not get dizzy or light-headed. If you are in pain, you may need to take or ask for pain medication before doing incentive spirometry. It is harder to take a deep breath if you are having pain. AFTER USE Rest and breathe slowly and easily. It can be helpful to keep track of a log of your progress. Your caregiver can provide you with a simple table to help with this. If you are using the spirometer at home, follow these instructions: [place] IF:  You are having difficultly using the spirometer. You have trouble using the spirometer as often as instructed. Your pain medication is not giving enough relief while using the spirometer. You develop fever of 100.5 F (38.1 C) or higher. SEEK IMMEDIATE MEDICAL CARE IF:  You cough up bloody sputum that had not been present before. You develop fever of 102 F (38.9 C) or greater. You develop worsening pain at or near the incision site. MAKE SURE YOU:  Understand these instructions. Will watch your condition. Will get help right away if you are not doing well or get worse. Document Released: 03/07/2007 Document Revised: 01/17/2012 Document Reviewed: 05/08/2007 [facility] Patient Information 2014 [place], [place].   ________________________________________________________________________

## 2021-11-25 ENCOUNTER — Ambulatory Visit
Admission: RE | Admit: 2021-11-25 | Discharge: 2021-11-25 | Disposition: A | Discharge status: Home / Self Care | Payer: Medicare PPO | Source: Ambulatory Visit | Attending: Orthopedic Surgery | Admitting: Orthopedic Surgery

## 2021-11-25 DIAGNOSIS — M25511 Pain in right shoulder: Secondary | ICD-10-CM

## 2021-11-25 DIAGNOSIS — S46011A Strain of muscle(s) and tendon(s) of the rotator cuff of right shoulder, initial encounter: Secondary | ICD-10-CM | POA: Diagnosis not present

## 2021-11-26 ENCOUNTER — Encounter (HOSPITAL_COMMUNITY): Payer: Self-pay

## 2021-11-26 ENCOUNTER — Other Ambulatory Visit: Payer: Self-pay

## 2021-11-26 ENCOUNTER — Encounter (HOSPITAL_COMMUNITY)
Admission: RE | Admit: 2021-11-26 | Discharge: 2021-11-26 | Disposition: A | Discharge status: Home / Self Care | Payer: Medicare PPO | Source: Ambulatory Visit | Attending: Orthopedic Surgery | Admitting: Orthopedic Surgery

## 2021-11-26 VITALS — BP 133/77 | HR 75 | Temp 97.9°F | Ht 65.0 in | Wt 147.0 lb

## 2021-11-26 DIAGNOSIS — Z01812 Encounter for preprocedural laboratory examination: Secondary | ICD-10-CM | POA: Diagnosis not present

## 2021-11-26 DIAGNOSIS — E119 Type 2 diabetes mellitus without complications: Secondary | ICD-10-CM | POA: Insufficient documentation

## 2021-11-26 DIAGNOSIS — Z01818 Encounter for other preprocedural examination: Secondary | ICD-10-CM

## 2021-11-26 HISTORY — DX: Nausea with vomiting, unspecified: R11.2

## 2021-11-26 HISTORY — DX: Dyspnea, unspecified: R06.00

## 2021-11-26 HISTORY — DX: Unspecified osteoarthritis, unspecified site: M19.90

## 2021-11-26 HISTORY — DX: Atherosclerotic heart disease of native coronary artery without angina pectoris: I25.10

## 2021-11-26 HISTORY — DX: Unspecified asthma, uncomplicated: J45.909

## 2021-11-26 HISTORY — DX: Sleep apnea, unspecified: G47.30

## 2021-11-26 HISTORY — DX: Other complications of anesthesia, initial encounter: T88.59XA

## 2021-11-26 HISTORY — DX: Other specified postprocedural states: Z98.890

## 2021-11-26 LAB — BASIC METABOLIC PANEL
Anion gap: 6 (ref 5–15)
BUN: 14 mg/dL (ref 8–23)
CO2: 29 mmol/L (ref 22–32)
Calcium: 10.7 mg/dL — ABNORMAL HIGH (ref 8.9–10.3)
Chloride: 103 mmol/L (ref 98–111)
Creatinine, Ser: 0.45 mg/dL (ref 0.44–1.00)
GFR, Estimated: >60 mL/min (ref >60)
Glucose, Bld: 171 mg/dL — ABNORMAL HIGH (ref 70–99)
Potassium: 4.2 mmol/L (ref 3.5–5.1)
Sodium: 138 mmol/L (ref 135–145)

## 2021-11-26 LAB — CBC
HCT: 43.3 % (ref 36.0–46.0)
Hemoglobin: 14.5 g/dL (ref 12.0–15.0)
MCH: 31.4 pg (ref 26.0–34.0)
MCHC: 33.5 g/dL (ref 30.0–36.0)
MCV: 93.7 fL (ref 80.0–100.0)
Platelets: 193 10*3/uL (ref 150–400)
RBC: 4.62 MIL/uL (ref 3.87–5.11)
RDW: 13.1 % (ref 11.5–15.5)
WBC: 3.5 10*3/uL — ABNORMAL LOW (ref 4.0–10.5)
nRBC: 0 % (ref 0.0–0.2)

## 2021-11-26 LAB — HEMOGLOBIN A1C
Hgb A1c MFr Bld: 7.4 % — ABNORMAL HIGH (ref 4.8–5.6)
Mean Plasma Glucose: 165.68 mg/dL

## 2021-11-26 LAB — SURGICAL PCR SCREEN
MRSA, PCR: NEGATIVE
Staphylococcus aureus: NEGATIVE

## 2021-11-26 LAB — GLUCOSE, CAPILLARY: Glucose-Capillary: 193 mg/dL — ABNORMAL HIGH (ref 70–99)

## 2021-11-26 NOTE — Progress Notes (Signed)
COVID Vaccine Completed: Yes Date COVID Vaccine completed: 11/17/21 x 4 COVID vaccine manufacturer: Pfizer     COVID Test: N/A PCP - Dr. Elsie Stain Cardiologist - Dr. Fransico Him. Clearance: Jossie Ng Cleaver: NPC: 11/12/21: EPIC  Chest x-ray -  EKG - 05/21/21 Stress Test -  ECHO - 05/21/21 Cardiac Cath - 02/22/20 Pacemaker/ICD device last checked:  Sleep Study - Yes CPAP - NO  Fasting Blood Sugar - 100's Checks Blood Sugar __2___ times a day  Blood Thinner Instructions: Plavix will be held 5 days before surgery as per cardiologist instructions. Aspirin Instructions: Last Dose:  Anesthesia review: Hx: DIA,HTN,Heart murmur,CAD,OSA(NO CPAP),pt. Uses a mouth piece.  Patient denies shortness of breath, fever, cough and chest pain at PAT appointment   Patient verbalized understanding of instructions that were given to them at the PAT appointment. Patient was also instructed that they will need to review over the PAT instructions again at home before surgery.

## 2021-11-27 ENCOUNTER — Telehealth: Payer: Self-pay | Admitting: Family Medicine

## 2021-11-27 ENCOUNTER — Encounter: Payer: Self-pay | Admitting: Family Medicine

## 2021-11-27 MED ORDER — LISINOPRIL 20 MG PO TABS: 20.0000 mg | ORAL_TABLET | Freq: Every day | ORAL | 3 refills | Status: DC

## 2021-11-27 NOTE — Telephone Encounter (Signed)
Would change to lisinopril 20mg  a day.  Rx sent. I called pt.  She can update [place] in about 2 weeks if her BP is not controlled (ie is still above 140/90).  She agrees with plan.

## 2021-11-27 NOTE — Telephone Encounter (Signed)
Pt son called stating that medication quinapril (ACCUPRIL) 20 MG tablet, has a recall and they discontinued it. Pt son is asking if pt could get a substitute called in. Please advise.

## 2021-12-09 NOTE — Progress Notes (Signed)
Anesthesia Chart Review   Case: [id number] Date/Time: 12/18/21 1315   Procedure: REVERSE SHOULDER ARTHROPLASTY (Right: Shoulder) - 150   Anesthesia type: Choice   Pre-op diagnosis: Right shoulder rotator cuff arthropathy   Location: [facility] ROOM 08 / [facility]   Surgeons: Nicholes Stairs, MD       DISCUSSION:76 y.o. never smoker with h/o PONV, HTN, DM II, moderate AS (mean gradient 20.8 mmHg, valve area 1.48 cm2), CAD (PCI and DES to her mid RCA 4/21), sleep apnea, right shoulder rotator cuff arthropathy scheduled for above procedure 12/18/2021 with Dr. Victorino December.   Per cardiology preoperative evaluation 11/16/2021, "Chart reviewed as part of pre-operative protocol coverage. Given past medical history and time since last visit, based on ACC/AHA guidelines, Gloria [name] would be at acceptable risk for the planned procedure without further cardiovascular testing.    Her RCRI is a class III risk, 6.6% risk of major cardiac event.  She is able to complete greater than 4 METS of physical activity.   Her Plavix may be held for 5 days prior to her procedure.  Please resume as soon as hemostasis is achieved."  Anticipate pt can proceed with planned procedure barring acute status change.   VS: BP 133/77    Pulse 75    Temp 36.6 C (Oral)    Ht '5\' 5"'  (1.651 m)    Wt 66.7 kg    LMP 08/08/2009    SpO2 100%    BMI 24.46 kg/m   PROVIDERS: Tonia Ghent, MD is Cardiologist   Fransico Him, MD is Cardiologist  LABS: Labs reviewed: Acceptable for surgery. (all labs ordered are listed, but only abnormal results are displayed)  Labs Reviewed  CBC - Abnormal; Notable for the following components:      Result Value   WBC 3.5 (*)    All other components within normal limits  BASIC METABOLIC PANEL - Abnormal; Notable for the following components:   Glucose, Bld 171 (*)    Calcium 10.7 (*)    All other components within normal limits  HEMOGLOBIN A1C - Abnormal; Notable for the following components:    Hgb A1c MFr Bld 7.4 (*)    All other components within normal limits  GLUCOSE, CAPILLARY - Abnormal; Notable for the following components:   Glucose-Capillary 193 (*)    All other components within normal limits  SURGICAL PCR SCREEN     IMAGES:   EKG: 05/21/2021 Rate 74 bpm  NSR  Low voltage QRS  CV: Echo 05/18/21 1. Left ventricular ejection fraction, by estimation, is 60 to 65%. The  left ventricle has normal function. The left ventricle has no regional  wall motion abnormalities. There is mild left ventricular hypertrophy.  Left ventricular diastolic parameters  are consistent with Grade I diastolic dysfunction (impaired relaxation).   2. Right ventricular systolic function is normal. The right ventricular  size is normal.   3. Left atrial size was moderately dilated.   4. The mitral valve is abnormal. Trivial mitral valve regurgitation. No  evidence of mitral stenosis. Moderate mitral annular calcification.   5. Since echo done 03/28/20 gradients have decreased mean 25.2-> 20.8 mmHg  peak 46-35.8 mmHg EF remains normal . The aortic valve was not well  visualized. There is moderate calcification of the aortic valve. There is  moderate thickening of the aortic  valve. Aortic valve regurgitation is mild. Moderate aortic valve stenosis.   6. The inferior vena cava is normal in size with greater than  50%  respiratory variability, suggesting right atrial pressure of 3 mmHg. Past Medical History:  Diagnosis Date   ABDOMINAL PAIN OTHER SPECIFIED SITE 08/27/2010   Qualifier: Diagnosis of  By: Damita Dunnings MD, Phillip Heal     Advance directive discussed with patient 01/03/2012   12/2011-AD d/w pt.  She has talked to husband but doesn't have living will yet.  Full code in discussion with patient today.  Would not want prolonged interventions if she were profoundly and/or permanently impaired, ie severe dementia or a condition with no hope of improvement.   01/13/15-Advance directive d/w pt-  husband designated if patient were incapacitated.     Allergic rhinitis    Aortic stenosis    moderate by echo 05/2021 with mean AVG 57mHg   Arthritis    Asthma    Complication of anesthesia    Coronary artery disease    Diabetes mellitus without complication ([place] 075/44/9201  Qualifier: Diagnosis of  By: SCouncil MechanicMD, RHilaria Ota   Disorder of bone and cartilage 12/20/2007   2016 DXA: -1.4.  Consider repeat in 2021.      Dyspnea    Exertional shortness of breath 01/02/2020   Female cystocele 08/10/2012   Health care maintenance 02/09/2018   Hemorrhoids    Hypertension    Left shoulder pain 07/20/2016   Medicare annual wellness visit, subsequent 01/03/2012   MENOPAUSE, SURGICAL 12/16/2009   Qualifier: Diagnosis of  By: LLurlean NannyLPN, [place]     PONV (postoperative nausea and vomiting)    Pure hypercholesterolemia 03/30/2011   RENAL CALCULUS 05/24/2007   Qualifier: Diagnosis of  By: SCouncil MechanicMD, RHilaria Ota   RHINITIS 12/24/2010   Qualifier: Diagnosis of  By: DDamita DunningsMD, [place]     Sleep apnea    Snoring 01/02/2020   Ureteral stone with hydronephrosis 08/02/2012    Past Surgical History:  Procedure Laterality Date   ABDOMINAL HYSTERECTOMY     BASAL CELL CARCINOMA EXCISION  08/23/2017   left upper arm   CARPAL TUNNEL RELEASE     with bilateral releases   CORONARY ATHERECTOMY N/A 02/26/2020   Procedure: CORONARY ATHERECTOMY;  Surgeon: CSherren Mocha MD;  [facility];  Service: Cardiovascular;  Laterality: N/A;   CYSTECTOMY     Left breast, right lumpectomy B9   INGUINAL HERNIA REPAIR     Right (Dr. YAnnamaria Boots   [place]ATH AND CORONARY ANGIOGRAPHY N/A 02/22/2020   Procedure: LEFT HEART CATH AND CORONARY ANGIOGRAPHY;  Surgeon: CSherren Mocha MD;  [facility];  Service: Cardiovascular;  Laterality: N/A;   NSVD     X 2   TVH with vaginal prolapse  08/29/09   Transvag Tape w/Varitensor, Ant & Post Colporraphy, Uterosacral Lig susp, [place]  Culdoplasty   ueteroscopy  11/01   without laser secondary stone   Urological procedure  10/22 - 08/31/09   [facility]    MEDICATIONS:  ACCU-CHEK AVIVA PLUS test strip   ACCU-CHEK FASTCLIX LANCETS MISC   aspirin EC 81 MG tablet   atorvastatin (LIPITOR) 20 MG tablet   Blood Glucose Monitoring Suppl (ACCU-CHEK AVIVA PLUS) W/DEVICE KIT   Cholecalciferol (VITAMIN D) 50 MCG (2000 UT) CAPS   clopidogrel (PLAVIX) 75 MG tablet   conjugated estrogens (PREMARIN) vaginal cream   hydrochlorothiazide (HYDRODIURIL) 25 MG tablet   insulin detemir (LEVEMIR FLEXTOUCH) 100 UNIT/ML FlexPen   Insulin Pen Needle (B-D ULTRAFINE III SHORT PEN) 31G X 8 MM MISC   lisinopril (ZESTRIL) 20 MG tablet  metFORMIN (GLUCOPHAGE) 1000 MG tablet   nitroGLYCERIN (NITROSTAT) 0.4 MG SL tablet   Probiotic Product (ALIGN) 4 MG CAPS   Psyllium (METAMUCIL PO)   vitamin C (ASCORBIC ACID) 500 MG tablet   No current facility-administered medications for this encounter.     Konrad Felix Ward, PA-C [facility] Pre-Surgical Testing [phone]

## 2021-12-09 NOTE — Anesthesia Preprocedure Evaluation (Addendum)
Anesthesia Evaluation  Patient identified by MRN, date of birth, ID band Patient awake    Reviewed: Allergy & Precautions, H&P , NPO status , Patient's Chart, lab work & pertinent test results, reviewed documented beta blocker date and time   History of Anesthesia Complications (+) PONV and history of anesthetic complications  Airway Mallampati: II  TM Distance: >3 FB Neck ROM: full    Dental no notable dental hx.    Pulmonary sleep apnea and Continuous Positive Airway Pressure Ventilation ,    Pulmonary exam normal breath sounds clear to auscultation       Cardiovascular Exercise Tolerance: Good hypertension, + CAD and + Cardiac Stents  + Valvular Problems/Murmurs AS  Rhythm:regular Rate:Normal  Echo 05/18/21 1. Left ventricular ejection fraction, by estimation, is 60 to 65%. The  left ventricle has normal function. The left ventricle has no regional  wall motion abnormalities. There is mild left ventricular hypertrophy.  Left ventricular diastolic parameters  are consistent with Grade I diastolic dysfunction (impaired relaxation).  2. Right ventricular systolic function is normal. The right ventricular  size is normal.  3. Left atrial size was moderately dilated.  4. The mitral valve is abnormal. Trivial mitral valve regurgitation. No  evidence of mitral stenosis. Moderate mitral annular calcification.  5. Since echo done 03/28/20 gradients have decreased mean 25.2-> 20.8 mmHg  peak 46-35.8 mmHg EF remains normal . The aortic valve was not well  visualized. There is moderate calcification of the aortic valve. There is  moderate thickening of the aortic  valve. Aortic valve regurgitation is mild. Moderate aortic valve stenosis.  6. The inferior vena cava is normal in size with greater than 50%  respiratory variability, suggesting right atrial pressure of 3 mmHg.   Neuro/Psych negative neurological ROS  negative psych  ROS   GI/Hepatic negative GI ROS, Neg liver ROS,   Endo/Other  diabetes  Renal/GU negative Renal ROS  negative genitourinary   Musculoskeletal  (+) Arthritis , Osteoarthritis,    Abdominal   Peds  Hematology negative hematology ROS (+)   Anesthesia Other Findings   Reproductive/Obstetrics negative OB ROS                           Anesthesia Physical Anesthesia Plan  ASA: 3  Anesthesia Plan: General   Post-op Pain Management:    Induction: Intravenous  PONV Risk Score and Plan: 3 and Ondansetron, Dexamethasone and Midazolam  Airway Management Planned: Oral ETT and LMA  Additional Equipment:   Intra-op Plan:   Post-operative Plan: Extubation in OR  Informed Consent: I have reviewed the patients History and Physical, chart, labs and discussed the procedure including the risks, benefits and alternatives for the proposed anesthesia with the patient or authorized representative who has indicated his/her understanding and acceptance.     Dental Advisory Given  Plan Discussed with: CRNA and Anesthesiologist  Anesthesia Plan Comments: (See PAT note 11/26/2021, Konrad Felix Ward, PA-C  DISCUSSION:76 y.o. never smoker with h/o PONV, HTN, DM II, moderate AS (mean gradient 20.8 mmHg, valve area 1.48 cm2), CAD (PCI and DES to her mid RCA 4/21), sleep apnea, right shoulder rotator cuff arthropathy scheduled for above procedure 12/18/2021 with Dr. Victorino December.   Per cardiology preoperative evaluation 11/16/2021, "Chart reviewed as part of pre-operative protocol coverage. Given past medical history and time since last visit, based on ACC/AHA guidelines,Lakima G Hooperwould be at acceptable risk for the planned procedure without further cardiovascular testing.  Her RCRI is a class III risk, 6.6% risk of major cardiac event. She is able to complete greater than 4 METS of physical activity. )       Anesthesia Quick Evaluation

## 2021-12-12 ENCOUNTER — Encounter: Payer: Self-pay | Admitting: Family Medicine

## 2021-12-13 NOTE — Telephone Encounter (Signed)
See mychart message.  Please cancel the upcoming lab appointment and reschedule for later in the sprint after she has her surgery.  Thanks.

## 2021-12-14 ENCOUNTER — Other Ambulatory Visit: Payer: Self-pay | Admitting: Orthopedic Surgery

## 2021-12-14 NOTE — Progress Notes (Signed)
Subjective:   Gloria [name] is a 76 y.o. female who presents for Medicare Annual (Subsequent) preventive examination.  I connected with Gloria [name] today by telephone and verified that I am speaking with the correct person using two identifiers. Location patient: home Location provider: work Persons participating in the virtual visit: patient, Marine scientist.    I discussed the limitations, risks, security and privacy concerns of performing an evaluation and management service by telephone and the availability of in person appointments. I also discussed with the patient that there may be a patient responsible charge related to this service. The patient expressed understanding and verbally consented to this telephonic visit.    Interactive audio and video telecommunications were attempted between this provider and patient, however failed, due to patient having technical difficulties OR patient did not have access to video capability.  We continued and completed visit with audio only.  Some vital signs may be absent or patient reported.   Time Spent with patient on telephone encounter: 20 minutes  Review of Systems     Cardiac Risk Factors include: advanced age (>46mn, >>46women);diabetes mellitus;hypertension     Objective:    Today's Vitals   12/17/21 1357  Weight: 147 lb (66.7 kg)  Height: '5\' 6"'  (1.676 m)   Body mass index is 23.73 kg/m.  Advanced Directives 12/17/2021 11/26/2021 03/06/2020 02/26/2020 02/26/2020 02/22/2020 06/29/2019  Does Patient Have a Medical Advance Directive? Yes Yes Yes Yes Yes Yes Yes  Type of AParamedicof AHaliimaileLiving will Living will;Healthcare Power of AMarshallLiving will HCrab OrchardLiving will HFort Indiantown GapLiving will Healthcare Power of AEstacadaLiving will  Does patient want to make changes to medical advance directive? Yes  (MAU/Ambulatory/Procedural Areas - Information given) - No - Guardian declined No - Patient declined - No - Patient declined -  Copy of HBlue Ballin Chart? - - - No - copy requested - No - copy requested No - copy requested    Current Medications (verified) Outpatient Encounter Medications as of 12/17/2021  Medication Sig   ACCU-CHEK AVIVA PLUS test strip CHECK BLOOD SUGAR FOUR TIMES DAILY AS NEEDED   ACCU-CHEK FASTCLIX LANCETS MISC Use to check blood sugar 4 times daily as needed.  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   aspirin EC 81 MG tablet Take 1 tablet (81 mg total) by mouth daily.   atorvastatin (LIPITOR) 20 MG tablet Take 1 tablet (20 mg total) by mouth daily.   Blood Glucose Monitoring Suppl (ACCU-CHEK AVIVA PLUS) W/DEVICE KIT Use to check blood sugar 4 times daily as needed.  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   clopidogrel (PLAVIX) 75 MG tablet Take 1 tablet (75 mg total) by mouth daily.   conjugated estrogens (PREMARIN) vaginal cream Place 1 applicator vaginally 2 (two) times a week.   hydrochlorothiazide (HYDRODIURIL) 25 MG tablet Take 1 tablet (25 mg total) by mouth daily.   insulin detemir (LEVEMIR FLEXTOUCH) 100 UNIT/ML FlexPen INJECT 45 UNITS SUBCUTANEOUSLY AT BEDTIME (Patient taking differently: Inject 46 Units into the skin every evening.)   Insulin Pen Needle (B-D ULTRAFINE III SHORT PEN) 31G X 8 MM MISC USE AS DIRECTED   lisinopril (ZESTRIL) 20 MG tablet Take 1 tablet (20 mg total) by mouth daily.   metFORMIN (GLUCOPHAGE) 1000 MG tablet TAKE 1 TABLET BY MOUTH TWICE DAILY WITH  MEALS   Probiotic Product (ALIGN) 4 MG CAPS Take 4 mg by mouth daily.    Psyllium (METAMUCIL PO) Take 1 Scoop by mouth daily as needed (Constipation).   vitamin C (ASCORBIC ACID) 500 MG tablet Take 500 mg by mouth daily.   nitroGLYCERIN (NITROSTAT) 0.4 MG SL tablet Place 1 tablet (0.4 mg total)  under the tongue every 5 (five) minutes as needed.   No facility-administered encounter medications on file as of 12/17/2021.    Allergies (verified) Sulfonamide derivatives, Ciprofloxin hcl [ciprofloxacin], and Penicillins   History: Past Medical History:  Diagnosis Date   ABDOMINAL PAIN OTHER SPECIFIED SITE 08/27/2010   Qualifier: Diagnosis of  By: Damita Dunnings MD, Phillip Heal     Advance directive discussed with patient 01/03/2012   12/2011-AD d/w pt.  She has talked to husband but doesn't have living will yet.  Full code in discussion with patient today.  Would not want prolonged interventions if she were profoundly and/or permanently impaired, ie severe dementia or a condition with no hope of improvement.   01/13/15-Advance directive d/w pt- husband designated if patient were incapacitated.     Allergic rhinitis    Aortic stenosis    moderate by echo 05/2021 with mean AVG 67mHg   Arthritis    Asthma    Complication of anesthesia    Coronary artery disease    Diabetes mellitus without complication ([place] [id number]  Qualifier: Diagnosis of  By: SCouncil MechanicMD, RHilaria Ota   Disorder of bone and cartilage 12/20/2007   2016 DXA: -1.4.  Consider repeat in 2021.      Dyspnea    Exertional shortness of breath 01/02/2020   Female cystocele 08/10/2012   Health care maintenance 02/09/2018   Hemorrhoids    Hypertension    Left shoulder pain 07/20/2016   Medicare annual wellness visit, subsequent 01/03/2012   MENOPAUSE, SURGICAL 12/16/2009   Qualifier: Diagnosis of  By: LLurlean NannyLPN, Regina     PONV (postoperative nausea and vomiting)    Pure hypercholesterolemia 03/30/2011   RENAL CALCULUS 05/24/2007   Qualifier: Diagnosis of  By: SCouncil MechanicMD, RHilaria Ota   RHINITIS 12/24/2010   Qualifier: Diagnosis of  By: DDamita DunningsMD, Graham     Sleep apnea    Snoring 01/02/2020   Ureteral stone with hydronephrosis 08/02/2012   Past Surgical History:  Procedure Laterality Date   ABDOMINAL HYSTERECTOMY      BASAL CELL CARCINOMA EXCISION  08/23/2017   left upper arm   CARPAL TUNNEL RELEASE     with bilateral releases   CORONARY ATHERECTOMY N/A 02/26/2020   Procedure: CORONARY ATHERECTOMY;  Surgeon: CSherren Mocha MD;  [facility];  Service: Cardiovascular;  Laterality: N/A;   CYSTECTOMY     Left breast, right lumpectomy B9   INGUINAL HERNIA REPAIR     Right (Dr. YAnnamaria Boots   [place]ATH AND CORONARY ANGIOGRAPHY N/A 02/22/2020   Procedure: LEFT HEART CATH AND CORONARY ANGIOGRAPHY;  Surgeon: CSherren Mocha MD;  [facility];  Service: Cardiovascular;  Laterality: N/A;   NSVD     X 2   TVH with vaginal prolapse  08/29/09   Transvag Tape w/Varitensor, Ant & Post Colporraphy, Uterosacral Lig susp, McCall Culdoplasty   ueteroscopy  11/01   without laser secondary stone   Urological procedure  10/22 - 08/31/09   [place]  Family History  Problem Relation Age of Onset   Hypertension Mother    Heart disease Mother  CHF   Breast cancer Cousin        Breast CA   Alcohol abuse Cousin    Stroke Maternal Aunt    Diabetes Paternal Aunt 74   Stroke Paternal Aunt    Heart disease Paternal Uncle        MI   Heart disease Paternal Uncle        MI   Thyroid disease Other    Prostate cancer Son    Colon cancer Neg Hx    Social History   Socioeconomic History   Marital status: Married    Spouse name: Not on file   Number of children: 2   Years of education: Not on file   Highest education level: Not on file  Occupational History   Occupation: Retired Pharmacist, hospital (primary education) from the [employer]    Employer: RETIRED  Tobacco Use   Smoking status: Never   Smokeless tobacco: Never  Vaping Use   Vaping Use: Never used  Substance and Sexual Activity   Alcohol use: Yes    Alcohol/week: 2.0 - 3.0 standard drinks    Types: 2 - 3 Glasses of wine per week    Comment: occas.   Drug use: No   Sexual activity: Not Currently  Other Topics  Concern   Not on file  Social History Narrative   Retired Pharmacist, hospital   Marital Status: Married, 1968   Children: 2 children out of the home   Social Determinants of Health   Financial Resource Strain: Low Risk    Difficulty of Paying Living Expenses: Not hard at all  Food Insecurity: No Food Insecurity   Worried About Charity fundraiser in the Last Year: Never true   Arboriculturist in the Last Year: Never true  Transportation Needs: No Transportation Needs   Lack of Transportation (Medical): No   Lack of Transportation (Non-Medical): No  Physical Activity: Insufficiently Active   Days of Exercise per Week: 7 days   Minutes of Exercise per Session: 20 min  Stress: No Stress Concern Present   Feeling of Stress : Not at all  Social Connections: Moderately Integrated   Frequency of Communication with Friends and Family: More than three times a week   Frequency of Social Gatherings with Friends and Family: More than three times a week   Attends Religious Services: More than 4 times per year   Active Member of [employer] or Organizations: No   Attends Music therapist: Never   Marital Status: Married    Tobacco Counseling Counseling given: Not Answered   Clinical Intake:  Pre-visit preparation completed: Yes  Pain : No/denies pain     BMI - recorded: 23.73 Nutritional Status: BMI of 19-24  Normal Nutritional Risks: None Diabetes: Yes CBG done?: No Did pt. bring in CBG monitor from home?: No  How often do you need to have someone help you when you read instructions, pamphlets, or other written materials from your doctor or pharmacy?: 1 - Never  Diabetes:  Is the patient diabetic?  Yes  If diabetic, was a CBG obtained today?  No  Did the patient bring in their glucometer from home?  No  How often do you monitor your CBG's? 2 times per day.   Financial Strains and Diabetes Management:  Are you having any financial strains with the device, your supplies or  your medication? No .  Does the patient want to be seen by Chronic Care Management for management  of their diabetes?  No  Would the patient like to be referred to a Nutritionist or for Diabetic Management?  No   Diabetic Exams:  Diabetic Eye Exam: Completed 12/15/21.   Diabetic Foot Exam: Pt has an upcoming appointment 12/24/21.   Interpreter Needed?: No  Information entered by :: Orrin Brigham LPN   Activities of Daily Living In your present state of health, do you have any difficulty performing the following activities: 12/17/2021 11/26/2021  Hearing? N N  Vision? N N  Difficulty concentrating or making decisions? N N  Walking or climbing stairs? N N  Dressing or bathing? N N  Doing errands, shopping? N N  Preparing Food and eating ? N -  Using the Toilet? N -  In the past six months, have you accidently leaked urine? N -  Do you have problems with loss of bowel control? N -  Managing your Medications? N -  Managing your Finances? N -  Housekeeping or managing your Housekeeping? N -  Some recent data might be hidden    Patient Care Team: Tonia Ghent, MD as PCP - General (Family Medicine) Sueanne Margarita, MD as PCP - Cardiology (Cardiology)  Indicate any recent Medical Services you may have received from other than [facility] providers in the past year (date may be approximate).     Assessment:   This is a routine wellness examination for [place].  Hearing/Vision screen Hearing Screening - Comments:: No issues  Vision Screening - Comments:: Last exam 12/15/21, Dr. Lyla Son, wears glasses  Dietary issues and exercise activities discussed: Current Exercise Habits: Home exercise routine, Type of exercise: walking;treadmill, Time (Minutes): 25, Frequency (Times/Week): 7, Weekly Exercise (Minutes/Week): 175, Intensity: Moderate   Goals Addressed             This Visit's Progress    Patient Stated       Would like to maintain current routine        Depression  Screen PHQ 2/9 Scores 12/17/2021 10/06/2020 06/11/2020 03/10/2020 06/29/2019 02/02/2018 01/25/2017  PHQ - 2 Score 0 0 0 0 0 0 0  PHQ- 9 Score - - 2 4 0 0 -    Fall Risk Fall Risk  12/17/2021 10/06/2020 03/06/2020 06/29/2019 02/02/2018  Falls in the past year? 0 0 1 0 No  Number falls in past yr: 0 0 0 - -  Injury with Fall? 0 0 0 - -  Risk for fall due to : No Fall Risks - - Medication side effect -  Follow up Falls prevention discussed Falls evaluation completed Falls evaluation completed;Education provided;Falls prevention discussed Falls evaluation completed;Falls prevention discussed -    FALL RISK PREVENTION PERTAINING TO THE HOME:  Any stairs in or around the home? Yes  If so, are there any without handrails? No  Home free of loose throw rugs in walkways, pet beds, electrical cords, etc? Yes  Adequate lighting in your home to reduce risk of falls? Yes   ASSISTIVE DEVICES UTILIZED TO PREVENT FALLS:  Life alert? No  Use of a cane, walker or w/c? No  Grab bars in the bathroom? Yes  Shower chair or bench in shower? No  Elevated toilet seat or a handicapped toilet? Yes   TIMED UP AND GO:  Was the test performed? No .    Cognitive Function: Normal cognitive status assessed by  this Nurse Health Advisor. No abnormalities found.   MMSE - Mini Mental State Exam 06/29/2019 02/02/2018  Orientation to time 5 5  Orientation to Place 5 5  Registration 3 3  Attention/ Calculation 5 0  Recall 3 3  Language- name 2 objects 0 0  Language- repeat 1 1  Language- follow 3 step command 0 3  Language- read & follow direction 0 0  Write a sentence 0 0  Copy design 0 0  Total score 22 20        Immunizations Immunization History  Administered Date(s) Administered   Influenza Split 09/28/2011   Influenza Whole 09/08/2005, 08/27/2010   Influenza, High Dose Seasonal PF 08/21/2016   Influenza,inj,Quad PF,6+ Mos 08/14/2018   Influenza-Unspecified 08/01/2014, 09/19/2015, 08/20/2016, 08/30/2017,  07/05/2019, 08/22/2020, 08/26/2021   PFIZER(Purple Top)SARS-COV-2 Vaccination 12/13/2019, 01/07/2020, 09/24/2020   Pfizer Covid-19 Vaccine Bivalent Booster 29yr & up 11/10/2021   Pneumococcal Conjugate-13 01/13/2015   Pneumococcal Polysaccharide-23 12/24/2010   Td 12/28/2002, 01/02/2013   Zoster Recombinat (Shingrix) 09/14/2019, 07/03/2021   Zoster, Live 12/24/2010    TDAP status: Up to date  Flu Vaccine status: Up to date  Pneumococcal vaccine status: Up to date  Covid-19 vaccine status: Completed vaccines  Qualifies for Shingles Vaccine? Yes   Zostavax completed Yes   Shingrix Completed?: Yes  Screening Tests Health Maintenance  Topic Date Due   OPHTHALMOLOGY EXAM  12/15/2021   MAMMOGRAM  02/23/2022   HEMOGLOBIN A1C  05/26/2022   FOOT EXAM  09/15/2022   TETANUS/TDAP  01/02/2023   COLONOSCOPY (Pts 45-442yrInsurance coverage will need to be confirmed)  10/18/2024   Pneumonia Vaccine 6535Years old  Completed   INFLUENZA VACCINE  Completed   DEXA SCAN  Completed   COVID-19 Vaccine  Completed   Hepatitis C Screening  Completed   Zoster Vaccines- Shingrix  Completed   HPV VACCINES  Aged Out    Health Maintenance  Health Maintenance Due  Topic Date Due   OPHTHALMOLOGY EXAM  12/15/2021    Colorectal cancer screening: No longer required.   Mammogram status: Completed 02/23/21. Repeat every year  Bone Density status: due, last completed 01/06/17  Lung Cancer Screening: (Low Dose CT Chest recommended if Age 77-80ears, 30 pack-year currently smoking OR have quit w/in 15years.) does not qualify.     Additional Screening:  Hepatitis C Screening: does qualify; Completed 07/15/15  Vision Screening: Recommended annual ophthalmology exams for early detection of glaucoma and other disorders of the eye. Is the patient up to date with their annual eye exam?  Yes  Who is the provider or what is the name of the office in which the patient attends annual eye exams? Dr.  DaLyla Son Dental Screening: Recommended annual dental exams for proper oral hygiene  Community Resource Referral / Chronic Care Management: CRR required this visit?  No   CCM required this visit?  No      Plan:     I have personally reviewed and noted the following in the patients chart:   Medical and social history Use of alcohol, tobacco or illicit drugs  Current medications and supplements including opioid prescriptions.  Functional ability and status Nutritional status Physical activity Advanced directives List of other physicians Hospitalizations, surgeries, and ER visits in previous 12 months Vitals Screenings to include cognitive, depression, and falls Referrals and appointments  In addition, I have reviewed and discussed with patient certain preventive protocols, quality metrics, and best practice recommendations. A written personalized care plan for preventive services as well as general preventive health recommendations were provided to patient.   Due to this being a telephonic visit, the  after visit summary with patients personalized plan was offered to patient via mail or my-chart.  Patient would like to access on my-chart.     Loma Messing, LPN   [id number]   Nurse Health Advisor  Nurse Notes: none

## 2021-12-15 DIAGNOSIS — E119 Type 2 diabetes mellitus without complications: Secondary | ICD-10-CM | POA: Diagnosis not present

## 2021-12-15 DIAGNOSIS — H524 Presbyopia: Secondary | ICD-10-CM | POA: Diagnosis not present

## 2021-12-15 DIAGNOSIS — H52203 Unspecified astigmatism, bilateral: Secondary | ICD-10-CM | POA: Diagnosis not present

## 2021-12-15 DIAGNOSIS — N993 Prolapse of vaginal vault after hysterectomy: Secondary | ICD-10-CM | POA: Diagnosis not present

## 2021-12-15 DIAGNOSIS — N816 Rectocele: Secondary | ICD-10-CM | POA: Diagnosis not present

## 2021-12-15 DIAGNOSIS — H5213 Myopia, bilateral: Secondary | ICD-10-CM | POA: Diagnosis not present

## 2021-12-15 DIAGNOSIS — H2513 Age-related nuclear cataract, bilateral: Secondary | ICD-10-CM | POA: Diagnosis not present

## 2021-12-17 ENCOUNTER — Ambulatory Visit (INDEPENDENT_AMBULATORY_CARE_PROVIDER_SITE_OTHER): Payer: Medicare PPO

## 2021-12-17 ENCOUNTER — Other Ambulatory Visit: Payer: Medicare PPO

## 2021-12-17 VITALS — Ht 66.0 in | Wt 147.0 lb

## 2021-12-17 DIAGNOSIS — Z0000 Encounter for general adult medical examination without abnormal findings: Secondary | ICD-10-CM

## 2021-12-17 NOTE — Patient Instructions (Addendum)
Gloria [name] , Thank you for taking time to complete your Medicare Wellness Visit. I appreciate your ongoing commitment to your health goals. Please review the following plan we discussed and let me know if I can assist you in the future.   Screening recommendations/referrals: Colonoscopy: no longer required  Mammogram: up to date, completed 02/23/21, 02/13/22 Bone Density: due, last completed 01/23/17 Recommended yearly ophthalmology/optometry visit for glaucoma screening and checkup Recommended yearly dental visit for hygiene and checkup  Vaccinations: Influenza vaccine: up to date  Pneumococcal vaccine: up to date  Tdap vaccine: up to date, completed 01/02/13 Shingles vaccine: up to date    Covid-19:up to date   Advanced directives: Please bring a copy of Living Will and/or [place] for your chart.   Conditions/risks identified: see problem list  Next appointment: Follow up in one year for your annual wellness visit 2//12/24 @ 2:45pm, this will be a telephone visit    Preventive Care 65 Years and Older, Female Preventive care refers to lifestyle choices and visits with your health care provider that can promote health and wellness. What does preventive care include? A yearly physical exam. This is also called an annual well check. Dental exams once or twice a year. Routine eye exams. Ask your health care provider how often you should have your eyes checked. Personal lifestyle choices, including: Daily care of your teeth and gums. Regular physical activity. Eating a healthy diet. Avoiding tobacco and drug use. Limiting alcohol use. Practicing safe sex. Taking low-dose aspirin every day. Taking vitamin and mineral supplements as recommended by your health care provider. What happens during an annual well check? The services and screenings done by your health care provider during your annual well check will depend on your age, overall health, lifestyle risk  factors, and family history of disease. Counseling  Your health care provider may ask you questions about your: Alcohol use. Tobacco use. Drug use. Emotional well-being. Home and relationship well-being. Sexual activity. Eating habits. History of falls. Memory and ability to understand (cognition). Work and work Statistician. Reproductive health. Screening  You may have the following tests or measurements: Height, weight, and BMI. Blood pressure. Lipid and cholesterol levels. These may be checked every 5 years, or more frequently if you are over 75 years old. Skin check. Lung cancer screening. You may have this screening every year starting at age 56 if you have a 30-pack-year history of smoking and currently smoke or have quit within the past 15 years. Fecal occult blood test (FOBT) of the stool. You may have this test every year starting at age 71. Flexible sigmoidoscopy or colonoscopy. You may have a sigmoidoscopy every 5 years or a colonoscopy every 10 years starting at age 82. Hepatitis C blood test. Hepatitis B blood test. Sexually transmitted disease (STD) testing. Diabetes screening. This is done by checking your blood sugar (glucose) after you have not eaten for a while (fasting). You may have this done every 1-3 years. Bone density scan. This is done to screen for osteoporosis. You may have this done starting at age 17. Mammogram. This may be done every 1-2 years. Talk to your health care provider about how often you should have regular mammograms. Talk with your health care provider about your test results, treatment options, and if necessary, the need for more tests. Vaccines  Your health care provider may recommend certain vaccines, such as: Influenza vaccine. This is recommended every year. Tetanus, diphtheria, and acellular pertussis (Tdap, Td) vaccine. You  may need a Td booster every 10 years. Zoster vaccine. You may need this after age 24. Pneumococcal 13-valent  conjugate (PCV13) vaccine. One dose is recommended after age 36. Pneumococcal polysaccharide (PPSV23) vaccine. One dose is recommended after age 66. Talk to your health care provider about which screenings and vaccines you need and how often you need them. This information is not intended to replace advice given to you by your health care provider. Make sure you discuss any questions you have with your health care provider. Document Released: 11/21/2015 Document Revised: 07/14/2016 Document Reviewed: 08/26/2015 Elsevier Interactive Patient Education  2017 [place] Prevention in the Home Falls can cause injuries. They can happen to people of all ages. There are many things you can do to make your home safe and to help prevent falls. What can I do on the outside of my home? Regularly fix the edges of walkways and driveways and fix any cracks. Remove anything that might make you trip as you walk through a door, such as a raised step or threshold. Trim any bushes or trees on the path to your home. Use bright outdoor lighting. Clear any walking paths of anything that might make someone trip, such as rocks or tools. Regularly check to see if handrails are loose or broken. Make sure that both sides of any steps have handrails. Any raised decks and porches should have guardrails on the edges. Have any leaves, snow, or ice cleared regularly. Use sand or salt on walking paths during winter. Clean up any spills in your garage right away. This includes oil or grease spills. What can I do in the bathroom? Use night lights. Install grab bars by the toilet and in the tub and shower. Do not use towel bars as grab bars. Use non-skid mats or decals in the tub or shower. If you need to sit down in the shower, use a plastic, non-slip stool. Keep the floor dry. Clean up any water that spills on the floor as soon as it happens. Remove soap buildup in the tub or shower regularly. Attach bath mats  securely with double-sided non-slip rug tape. Do not have throw rugs and other things on the floor that can make you trip. What can I do in the bedroom? Use night lights. Make sure that you have a light by your bed that is easy to reach. Do not use any sheets or blankets that are too big for your bed. They should not hang down onto the floor. Have a firm chair that has side arms. You can use this for support while you get dressed. Do not have throw rugs and other things on the floor that can make you trip. What can I do in the kitchen? Clean up any spills right away. Avoid walking on wet floors. Keep items that you use a lot in easy-to-reach places. If you need to reach something above you, use a strong step stool that has a grab bar. Keep electrical cords out of the way. Do not use floor polish or wax that makes floors slippery. If you must use wax, use non-skid floor wax. Do not have throw rugs and other things on the floor that can make you trip. What can I do with my stairs? Do not leave any items on the stairs. Make sure that there are handrails on both sides of the stairs and use them. Fix handrails that are broken or loose. Make sure that handrails are as long as the  stairways. Check any carpeting to make sure that it is firmly attached to the stairs. Fix any carpet that is loose or worn. Avoid having throw rugs at the top or bottom of the stairs. If you do have throw rugs, attach them to the floor with carpet tape. Make sure that you have a light switch at the top of the stairs and the bottom of the stairs. If you do not have them, ask someone to add them for you. What else can I do to help prevent falls? Wear shoes that: Do not have high heels. Have rubber bottoms. Are comfortable and fit you well. Are closed at the toe. Do not wear sandals. If you use a stepladder: Make sure that it is fully opened. Do not climb a closed stepladder. Make sure that both sides of the stepladder  are locked into place. Ask someone to hold it for you, if possible. Clearly mark and make sure that you can see: Any grab bars or handrails. First and last steps. Where the edge of each step is. Use tools that help you move around (mobility aids) if they are needed. These include: Canes. Walkers. Scooters. Crutches. Turn on the lights when you go into a dark area. Replace any light bulbs as soon as they burn out. Set up your furniture so you have a clear path. Avoid moving your furniture around. If any of your floors are uneven, fix them. If there are any pets around you, be aware of where they are. Review your medicines with your doctor. Some medicines can make you feel dizzy. This can increase your chance of falling. Ask your doctor what other things that you can do to help prevent falls. This information is not intended to replace advice given to you by your health care provider. Make sure you discuss any questions you have with your health care provider. Document Released: 08/21/2009 Document Revised: 04/01/2016 Document Reviewed: 11/29/2014 Elsevier Interactive Patient Education  2017 [employer].

## 2021-12-18 ENCOUNTER — Encounter (HOSPITAL_COMMUNITY)
Admission: RE | Disposition: A | Discharge status: Home / Self Care | Payer: Self-pay | Source: Home / Self Care | Attending: Orthopedic Surgery

## 2021-12-18 ENCOUNTER — Other Ambulatory Visit: Payer: Self-pay

## 2021-12-18 ENCOUNTER — Ambulatory Visit (HOSPITAL_BASED_OUTPATIENT_CLINIC_OR_DEPARTMENT_OTHER): Payer: Medicare PPO | Admitting: Anesthesiology

## 2021-12-18 ENCOUNTER — Ambulatory Visit (HOSPITAL_COMMUNITY)
Admission: RE | Admit: 2021-12-18 | Discharge: 2021-12-18 | Disposition: A | Discharge status: Home / Self Care | Payer: Medicare PPO | Attending: Orthopedic Surgery | Admitting: Orthopedic Surgery

## 2021-12-18 ENCOUNTER — Encounter (HOSPITAL_COMMUNITY): Payer: Self-pay | Admitting: Orthopedic Surgery

## 2021-12-18 ENCOUNTER — Ambulatory Visit (HOSPITAL_COMMUNITY): Payer: Medicare PPO

## 2021-12-18 ENCOUNTER — Ambulatory Visit (HOSPITAL_COMMUNITY): Payer: Medicare PPO | Admitting: Physician Assistant

## 2021-12-18 DIAGNOSIS — I2510 Atherosclerotic heart disease of native coronary artery without angina pectoris: Secondary | ICD-10-CM | POA: Diagnosis not present

## 2021-12-18 DIAGNOSIS — I10 Essential (primary) hypertension: Secondary | ICD-10-CM | POA: Diagnosis not present

## 2021-12-18 DIAGNOSIS — E119 Type 2 diabetes mellitus without complications: Secondary | ICD-10-CM | POA: Insufficient documentation

## 2021-12-18 DIAGNOSIS — M25511 Pain in right shoulder: Secondary | ICD-10-CM | POA: Diagnosis not present

## 2021-12-18 DIAGNOSIS — G4730 Sleep apnea, unspecified: Secondary | ICD-10-CM | POA: Diagnosis not present

## 2021-12-18 DIAGNOSIS — Z96611 Presence of right artificial shoulder joint: Secondary | ICD-10-CM | POA: Diagnosis not present

## 2021-12-18 DIAGNOSIS — M75121 Complete rotator cuff tear or rupture of right shoulder, not specified as traumatic: Secondary | ICD-10-CM | POA: Diagnosis not present

## 2021-12-18 DIAGNOSIS — M12811 Other specific arthropathies, not elsewhere classified, right shoulder: Secondary | ICD-10-CM

## 2021-12-18 HISTORY — PX: REVERSE SHOULDER ARTHROPLASTY: SHX5054

## 2021-12-18 LAB — GLUCOSE, CAPILLARY
Glucose-Capillary: 158 mg/dL — ABNORMAL HIGH (ref 70–99)
Glucose-Capillary: 131 mg/dL — ABNORMAL HIGH (ref 70–99)

## 2021-12-18 SURGERY — ARTHROPLASTY, SHOULDER, TOTAL, REVERSE
Anesthesia: General | Site: Shoulder | Laterality: Right

## 2021-12-18 MED ORDER — FENTANYL CITRATE PF 50 MCG/ML IJ SOSY: 25.0000 ug | PREFILLED_SYRINGE | INTRAMUSCULAR | Status: DC | PRN

## 2021-12-18 MED ORDER — LIDOCAINE 2% (20 MG/ML) 5 ML SYRINGE
INTRAMUSCULAR | Status: DC | PRN
  Administered 2021-12-18: 100 mg via INTRAVENOUS

## 2021-12-18 MED ORDER — ROCURONIUM BROMIDE 10 MG/ML (PF) SYRINGE
PREFILLED_SYRINGE | INTRAVENOUS | Status: AC
  Filled 2021-12-18: qty 10

## 2021-12-18 MED ORDER — CHLORHEXIDINE GLUCONATE 0.12 % MT SOLN: 15.0000 mL | Freq: Once | OROMUCOSAL | Status: AC

## 2021-12-18 MED ORDER — ONDANSETRON HCL 4 MG PO TABS: 4.0000 mg | ORAL_TABLET | Freq: Three times a day (TID) | ORAL | 0 refills | Status: DC | PRN

## 2021-12-18 MED ORDER — OXYCODONE HCL 5 MG PO TABS
5.0000 mg | ORAL_TABLET | Freq: Four times a day (QID) | ORAL | 0 refills | Status: DC | PRN
Start: 2021-12-18 — End: 2022-02-15

## 2021-12-18 MED ORDER — ONDANSETRON HCL 4 MG/2ML IJ SOLN: 4.0000 mg | Freq: Once | INTRAMUSCULAR | Status: DC | PRN

## 2021-12-18 MED ORDER — OXYCODONE HCL 5 MG PO TABS: 5.0000 mg | ORAL_TABLET | Freq: Once | ORAL | Status: DC | PRN

## 2021-12-18 MED ORDER — LACTATED RINGERS IV SOLN: INTRAVENOUS | Status: DC

## 2021-12-18 MED ORDER — ACETAMINOPHEN 160 MG/5ML PO SOLN: 325.0000 mg | ORAL | Status: DC | PRN

## 2021-12-18 MED ORDER — VANCOMYCIN HCL 1000 MG IV SOLR
INTRAVENOUS | Status: DC | PRN
  Administered 2021-12-18: 1000 mg

## 2021-12-18 MED ORDER — CEFAZOLIN SODIUM-DEXTROSE 2-4 GM/100ML-% IV SOLN
2.0000 g | INTRAVENOUS | Status: AC
  Administered 2021-12-18: 2 g via INTRAVENOUS
  Filled 2021-12-18: qty 100

## 2021-12-18 MED ORDER — BUPIVACAINE-EPINEPHRINE (PF) 0.5% -1:200000 IJ SOLN
INTRAMUSCULAR | Status: DC | PRN
  Administered 2021-12-18: 15 mL via PERINEURAL

## 2021-12-18 MED ORDER — DEXAMETHASONE SODIUM PHOSPHATE 4 MG/ML IJ SOLN
INTRAMUSCULAR | Status: DC | PRN
  Administered 2021-12-18: 5 mg via INTRAVENOUS

## 2021-12-18 MED ORDER — ONDANSETRON HCL 4 MG/2ML IJ SOLN
INTRAMUSCULAR | Status: DC | PRN
  Administered 2021-12-18: 4 mg via INTRAVENOUS

## 2021-12-18 MED ORDER — PHENYLEPHRINE 40 MCG/ML (10ML) SYRINGE FOR IV PUSH (FOR BLOOD PRESSURE SUPPORT)
PREFILLED_SYRINGE | INTRAVENOUS | Status: DC | PRN
  Administered 2021-12-18: 120 ug via INTRAVENOUS

## 2021-12-18 MED ORDER — ORAL CARE MOUTH RINSE
15.0000 mL | Freq: Once | OROMUCOSAL | Status: AC
  Administered 2021-12-18: 15 mL via OROMUCOSAL

## 2021-12-18 MED ORDER — MEPERIDINE HCL 50 MG/ML IJ SOLN: 6.2500 mg | INTRAMUSCULAR | Status: DC | PRN

## 2021-12-18 MED ORDER — ONDANSETRON HCL 4 MG/2ML IJ SOLN
INTRAMUSCULAR | Status: AC
  Filled 2021-12-18: qty 2

## 2021-12-18 MED ORDER — 0.9 % SODIUM CHLORIDE (POUR BTL) OPTIME
TOPICAL | Status: DC | PRN
  Administered 2021-12-18: 1000 mL

## 2021-12-18 MED ORDER — TRANEXAMIC ACID-NACL 1000-0.7 MG/100ML-% IV SOLN
1000.0000 mg | INTRAVENOUS | Status: AC
  Administered 2021-12-18: 1000 mg via INTRAVENOUS
  Filled 2021-12-18: qty 100

## 2021-12-18 MED ORDER — BUPIVACAINE LIPOSOME 1.3 % IJ SUSP
INTRAMUSCULAR | Status: DC | PRN
  Administered 2021-12-18: 10 mL via PERINEURAL

## 2021-12-18 MED ORDER — PROPOFOL 10 MG/ML IV BOLUS
INTRAVENOUS | Status: AC
  Filled 2021-12-18: qty 20

## 2021-12-18 MED ORDER — OXYCODONE HCL 5 MG/5ML PO SOLN: 5.0000 mg | Freq: Once | ORAL | Status: DC | PRN

## 2021-12-18 MED ORDER — MIDAZOLAM HCL 2 MG/2ML IJ SOLN
1.0000 mg | INTRAMUSCULAR | Status: DC
  Administered 2021-12-18: 1 mg via INTRAVENOUS
  Filled 2021-12-18: qty 2

## 2021-12-18 MED ORDER — ACETAMINOPHEN 325 MG PO TABS: 325.0000 mg | ORAL_TABLET | ORAL | Status: DC | PRN

## 2021-12-18 MED ORDER — PROPOFOL 10 MG/ML IV BOLUS
INTRAVENOUS | Status: DC | PRN
  Administered 2021-12-18: 120 mg via INTRAVENOUS
  Administered 2021-12-18: 50 mg via INTRAVENOUS

## 2021-12-18 MED ORDER — FENTANYL CITRATE PF 50 MCG/ML IJ SOSY
50.0000 ug | PREFILLED_SYRINGE | INTRAMUSCULAR | Status: DC
  Administered 2021-12-18: 50 ug via INTRAVENOUS
  Filled 2021-12-18: qty 2

## 2021-12-18 MED ORDER — SUGAMMADEX SODIUM 200 MG/2ML IV SOLN
INTRAVENOUS | Status: DC | PRN
  Administered 2021-12-18: 200 mg via INTRAVENOUS

## 2021-12-18 MED ORDER — DEXAMETHASONE SODIUM PHOSPHATE 10 MG/ML IJ SOLN
INTRAMUSCULAR | Status: AC
  Filled 2021-12-18: qty 1

## 2021-12-18 MED ORDER — LIDOCAINE HCL (PF) 2 % IJ SOLN
INTRAMUSCULAR | Status: AC
  Filled 2021-12-18: qty 5

## 2021-12-18 MED ORDER — PHENYLEPHRINE HCL-NACL 20-0.9 MG/250ML-% IV SOLN
INTRAVENOUS | Status: DC | PRN
Start: 2021-12-18 — End: 2021-12-18
  Administered 2021-12-18: 30 ug/min via INTRAVENOUS

## 2021-12-18 MED ORDER — STERILE WATER FOR IRRIGATION IR SOLN
Status: DC | PRN
  Administered 2021-12-18: 1000 mL

## 2021-12-18 MED ORDER — ROCURONIUM BROMIDE 10 MG/ML (PF) SYRINGE
PREFILLED_SYRINGE | INTRAVENOUS | Status: DC | PRN
  Administered 2021-12-18: 60 mg via INTRAVENOUS

## 2021-12-18 SURGICAL SUPPLY — 61 items
BAG COUNTER SPONGE SURGICOUNT (BAG) IMPLANT
BAG ZIPLOCK 12X15 (MISCELLANEOUS) ×2 IMPLANT
BLADE SAG 18X100X1.27 (BLADE) ×2 IMPLANT
CALIBRATOR GLENOID VIP 5-D (SYSTAGENIX WOUND MANAGEMENT) ×1 IMPLANT
CHLORAPREP W/TINT 26 (MISCELLANEOUS) ×1 IMPLANT
COVER BACK TABLE 60X90IN (DRAPES) ×2 IMPLANT
COVER SURGICAL LIGHT HANDLE (MISCELLANEOUS) ×2 IMPLANT
CUP SUT UNIV REV NEUTRAL 33 (Cup) ×1 IMPLANT
DRAPE ORTHO SPLIT 77X108 STRL (DRAPES) ×4
DRAPE SHEET LG 3/4 BI-LAMINATE (DRAPES) ×2 IMPLANT
DRAPE SURG 17X11 SM STRL (DRAPES) ×2 IMPLANT
DRAPE SURG ORHT 6 SPLT 77X108 (DRAPES) ×2 IMPLANT
DRAPE TOP 10253 STERILE (DRAPES) ×2 IMPLANT
DRAPE U-SHAPE 47X51 STRL (DRAPES) ×2 IMPLANT
DRSG AQUACEL AG ADV 3.5X 6 (GAUZE/BANDAGES/DRESSINGS) IMPLANT
DRSG AQUACEL AG ADV 3.5X10 (GAUZE/BANDAGES/DRESSINGS) ×2 IMPLANT
ELECT REM PT RETURN 15FT ADLT (MISCELLANEOUS) ×2 IMPLANT
FACESHIELD WRAPAROUND (MASK) ×2 IMPLANT
FACESHIELD WRAPAROUND OR TEAM (MASK) ×1 IMPLANT
GLENOID UNI REV MOD 24 +2 LAT (Joint) ×1 IMPLANT
GLENOSPHERE 36 +4 LAT/24 (Joint) ×1 IMPLANT
GLOVE SRG 8 PF TXTR STRL LF DI (GLOVE) ×2 IMPLANT
GLOVE SURG ENC MOIS LTX SZ7.5 (GLOVE) ×8 IMPLANT
GLOVE SURG UNDER POLY LF SZ8 (GLOVE) ×4
GOWN STRL REUS W/ TWL XL LVL3 (GOWN DISPOSABLE) ×2 IMPLANT
GOWN STRL REUS W/TWL XL LVL3 (GOWN DISPOSABLE) ×4
INSERT HUM 33 +3/36 COMBO (Insert) ×1 IMPLANT
KIT BASIN OR (CUSTOM PROCEDURE TRAY) ×2 IMPLANT
KIT TURNOVER KIT A (KITS) IMPLANT
MANIFOLD NEPTUNE II (INSTRUMENTS) ×2 IMPLANT
NDL TAPERED W/ NITINOL LOOP (MISCELLANEOUS) IMPLANT
NEEDLE TAPERED W/ NITINOL LOOP (MISCELLANEOUS) IMPLANT
NS IRRIG 1000ML POUR BTL (IV SOLUTION) ×2 IMPLANT
PACK SHOULDER (CUSTOM PROCEDURE TRAY) ×2 IMPLANT
PROTECTOR NERVE ULNAR (MISCELLANEOUS) ×2 IMPLANT
RESTRAINT HEAD UNIVERSAL NS (MISCELLANEOUS) ×1 IMPLANT
SCREW CENTRAL MODULAR 25 (Screw) ×1 IMPLANT
SCREW PERI LOCK 5.5X16 (Screw) ×2 IMPLANT
SCREW PERIPHERAL 5.5X28 LOCK (Screw) ×2 IMPLANT
SLING ARM FOAM STRAP MED (SOFTGOODS) IMPLANT
SLING ARM IMMOBILIZER MED (SOFTGOODS) ×1 IMPLANT
SMARTMIX MINI TOWER (MISCELLANEOUS)
SPONGE T-LAP 18X18 ~~LOC~~+RFID (SPONGE) ×3 IMPLANT
STEM HUMERAL MOD SZ 5 135 DEG (Stem) ×1 IMPLANT
STRIP CLOSURE SKIN 1/2X4 (GAUZE/BANDAGES/DRESSINGS) ×2 IMPLANT
SUCTION FRAZIER HANDLE 10FR (MISCELLANEOUS) ×2
SUCTION TUBE FRAZIER 10FR DISP (MISCELLANEOUS) ×1 IMPLANT
SUT FIBERWIRE #2 38 T-5 BLUE (SUTURE)
SUT MNCRL AB 3-0 PS2 18 (SUTURE) ×1 IMPLANT
SUT MON AB 3-0 SH 27 (SUTURE) ×2
SUT MON AB 3-0 SH27 (SUTURE) ×1 IMPLANT
SUT VIC AB 0 CT1 36 (SUTURE) ×2 IMPLANT
SUT VIC AB 1 CT1 36 (SUTURE) ×2 IMPLANT
SUT VIC AB 2-0 CT1 27 (SUTURE) ×2
SUT VIC AB 2-0 CT1 TAPERPNT 27 (SUTURE) ×1 IMPLANT
SUTURE FIBERWR #2 38 T-5 BLUE (SUTURE) IMPLANT
SUTURE TAPE 1.3 40 TPR END (SUTURE) ×2 IMPLANT
SUTURETAPE 1.3 40 TPR END (SUTURE) ×4
TOWEL OR 17X26 10 PK STRL BLUE (TOWEL DISPOSABLE) ×2 IMPLANT
TOWER SMARTMIX MINI (MISCELLANEOUS) IMPLANT
TUBE SUCTION HIGH CAP CLEAR NV (SUCTIONS) ×2 IMPLANT

## 2021-12-18 NOTE — Transfer of Care (Signed)
Immediate Anesthesia Transfer of Care Note  Patient: Gloria [name]  Procedure(s) Performed: REVERSE SHOULDER ARTHROPLASTY (Right: Shoulder)  Patient Location: PACU  Anesthesia Type:GA combined with regional for post-op pain  Level of Consciousness: awake and patient cooperative  Airway & Oxygen Therapy: Patient Spontanous Breathing and Patient connected to face mask  Post-op Assessment: Report given to RN and Post -op Vital signs reviewed and stable  Post vital signs: Reviewed and stable  Last Vitals:  Vitals Value Taken Time  BP 169/81 12/18/21 1436  Temp 36.4 C 12/18/21 1436  Pulse 69 12/18/21 1438  Resp 19 12/18/21 1438  SpO2 100 % 12/18/21 1438  Vitals shown include unvalidated device data.  Last Pain:  Vitals:   12/18/21 1116  TempSrc: Oral         Complications: No notable events documented.

## 2021-12-18 NOTE — Discharge Instructions (Addendum)
Orthopedic surgery discharge instructions:  -Maintain postoperative bandage until follow-up appointment.  This is waterproof, and you may begin showering on postoperative day #3.  Do not submerge underwater.  Maintain that bandage until your follow-up appointment in 2 weeks.  -No lifting over 2 pounds with operateive arm.  You may use the arm immediately for activities of daily living such as bathing, washing your face and brushing your teeth, eating, and getting dressed.  Otherwise maintain your sling when you are out of the house and sleeping.  -Apply ice liberally to the shoulder throughout the day.  For mild to moderate pain use Tylenol and Advil as needed around-the-clock.  For breakthrough pain use oxycodone as necessary.  -You will return to see Dr. Stann Mainland in the office in 2 weeks for routine postoperative check with x-rays.   -You may resume your normal Plavix regimen starting on postoperative day #1, February 11.

## 2021-12-18 NOTE — Brief Op Note (Signed)
12/18/2021  2:12 PM  PATIENT:  Gloria [name]  76 y.o. female  PRE-OPERATIVE DIAGNOSIS:  Right shoulder rotator cuff arthropathy  POST-OPERATIVE DIAGNOSIS:  Right shoulder rotator cuff arthropathy  PROCEDURE:  Procedure(s) with comments: REVERSE SHOULDER ARTHROPLASTY (Right) - 150  SURGEON:  Surgeon(s) and Role:    * Stann Mainland, Elly Modena, MD - Primary  PHYSICIAN ASSISTANT: Jonelle Sidle, PA-C  ANESTHESIA:   regional and general  EBL:  150 mL   BLOOD ADMINISTERED:none  DRAINS: none   LOCAL MEDICATIONS USED:  NONE  SPECIMEN:  No Specimen  DISPOSITION OF SPECIMEN:  N/A  COUNTS:  YES  TOURNIQUET:  * No tourniquets in log *  DICTATION: .Note written in EPIC  PLAN OF CARE: Discharge to home after PACU  PATIENT DISPOSITION:  PACU - hemodynamically stable.   Delay start of Pharmacological VTE agent (>24hrs) due to surgical blood loss or risk of bleeding: not applicable

## 2021-12-18 NOTE — Op Note (Addendum)
12/18/2021  2:13 PM  PATIENT:  Gloria [name]    PRE-OPERATIVE DIAGNOSIS:  Right shoulder rotator cuff arthropathy  POST-OPERATIVE DIAGNOSIS:  Same  PROCEDURE:  Right REVERSE SHOULDER ARTHROPLASTY  SURGEON:  Nicholes Stairs, MD  ASSISTANT: Jonelle Sidle, PA-C  Assistant attestation:  PA McClung present for the entire procedure.  He participated in all critical portions.  ANESTHESIA:   General with interscalene block  ESTIMATED BLOOD LOSS: 150 cc  PREOPERATIVE INDICATIONS:  Gloria [name] is a  76 y.o. female with a diagnosis of Right shoulder rotator cuff arthropathy who failed conservative measures and elected for surgical management.    The risks benefits and alternatives were discussed with the patient preoperatively including but not limited to the risks of infection, bleeding, nerve injury, cardiopulmonary complications, the need for revision surgery, dislocation, brachial plexus palsy, incomplete relief of pain, among others, and the patient was willing to proceed.  OPERATIVE IMPLANTS:  Arthrex size 5 universe reverse stem Size 24+2 lateralized glenoid baseplate Size 68+3 lateralized glenosphere Standard humeral tray with a 33+3 constrained polyethylene liner. 25 mm central compression screw with 4 peripheral locking screws.  OPERATIVE FINDINGS: Significant rotator cuff arthropathy encountered.  Advanced adhesions in the subdeltoid and subpectoralis space.  Spontaneous rupture and absence of the long head of the biceps was noted.  Complete absence of the supraspinatus and infraspinatus as well as arthritic change noted at the greater tuberosity with significant cystic change in the proximal metaphysis of the humerus.  On the glenoid side there was hypertrophic synovium as well as degenerative tearing of the labrum throughout.  Subscapularis was intact.  OPERATIVE PROCEDURE: The patient was brought to the operating room and placed in the supine position. General  anesthesia was administered. IV antibiotics were given. A Foley was placed. Time out was performed. The upper extremity was prepped and draped in usual sterile fashion. The patient was in a beachchair position. Deltopectoral approach was carried out.  Biceps tendon was spontaneously ruptured but we did release a portion of it in the inferior aspect of the intertubercular groove to access the anterior humeral circumflex for coagulation.  The biceps was released and not tenodesed.  The subscapularis was released off of the bone.   I then performed circumferential releases of the humerus, and then dislocated the head, and then reamed with the reamer to the above named size.  I then applied the jig, and cut the humeral head in 30 of retroversion, and then turned my attention to the glenoid.  Deep retractors were placed, and I resected the labrum, and then placed a guidepin into the center position on the glenoid, with slight inferior inclination.  We did utilize the VIP patient specific software to plan for the central pin placement and use the guide for that.  I then reamed over the guidepin, and this created a small metaphyseal cancellus blush inferiorly, removing just the cartilage to the subchondral bone superiorly. The base plate was selected and impacted place, and then I secured it centrally with a nonlocking screw, and I had excellent purchase both inferiorly and superiorly. I placed a short locking screws on anterior and posterior aspects.  I then turned my attention to the glenosphere, and impacted this into place.  The glenoid sphere was completely seated, and had engagement of the [facility] taper.  We then secured the central set screw.  I then turned my attention back to the humerus.  I sequentially broached, and then trialed, and was found to restore  soft tissue tension, and it had 2 finger tightness. Therefore the above named components were selected. The shoulder felt stable throughout  functional motion.  I then impacted the real prosthesis into place, as well as the real humeral tray, and reduced the shoulder. The shoulder had excellent motion, and was stable, and I irrigated the wounds copiously.   We then utilized our tagging suture tape for the subscapularis to secure this back to the anterior humerus via the suture cup tray on the humeral stem.  I then irrigated the shoulder copiously once more, placed 1 g of vancomycin powder into the wound, repaired the deltopectoral interval with #1 Vicryl followed by subcutaneous Vicryl, then monocryl for the skin,  with Steri-Strips and sterile gauze for the skin. The patient was awakened and returned back in stable and satisfactory condition. There no complications and they tolerated the procedure well.  All counts were correct x2.   Disposition:  Gloria [name] will be nonweightbearing other than for activities of daily living to the right upper extremity.  She may begin using the right upper extremity for activities of daily living immediately.  This includes eating, bathing, and getting dressed.  She will otherwise sleep in the sling and be in the sling when she leaves the house.  She will follow-up with me in the office in 2 weeks for routine postoperative care and x-rays.  She will discharge home today from PACU.

## 2021-12-18 NOTE — Anesthesia Procedure Notes (Signed)
Procedure Name: Intubation Date/Time: 12/18/2021 12:42 PM Performed by: Claudia Desanctis, CRNA Pre-anesthesia Checklist: Patient identified, Emergency Drugs available, Suction available and Patient being monitored Patient Re-evaluated:Patient Re-evaluated prior to induction Oxygen Delivery Method: Circle system utilized Preoxygenation: Pre-oxygenation with 100% oxygen Induction Type: IV induction Ventilation: Mask ventilation without difficulty Laryngoscope Size: 2 and Miller Grade View: Grade II Tube type: Oral Tube size: 7.0 mm Number of attempts: 1 Airway Equipment and Method: Stylet Placement Confirmation: ETT inserted through vocal cords under direct vision, positive ETCO2 and breath sounds checked- equal and bilateral Secured at: 21 cm Tube secured with: Tape Dental Injury: Teeth and Oropharynx as per pre-operative assessment  Comments: SLIGHTLY ANTERIOR

## 2021-12-18 NOTE — H&P (Signed)
ORTHOPAEDIC H&P  REQUESTING PHYSICIAN: Nicholes Stairs, MD  PCP:  Tonia Ghent, MD  Chief Complaint: Right shoulder rotator cuff arthropathy  HPI: Gloria [name] is a 76 y.o. female who complains of worsening right shoulder pain and dysfunction over the last few years.  She has had conservative treatment but has failed to see a significant improvement.  She is here today for reverse shoulder arthroplasty.  No new complaints at this time.  Past Medical History:  Diagnosis Date   ABDOMINAL PAIN OTHER SPECIFIED SITE 08/27/2010   Qualifier: Diagnosis of  By: Damita Dunnings MD, Phillip Heal     Advance directive discussed with patient 01/03/2012   12/2011-AD d/w pt.  She has talked to husband but doesn't have living will yet.  Full code in discussion with patient today.  Would not want prolonged interventions if she were profoundly and/or permanently impaired, ie severe dementia or a condition with no hope of improvement.   01/13/15-Advance directive d/w pt- husband designated if patient were incapacitated.     Allergic rhinitis    Aortic stenosis    moderate by echo 05/2021 with mean AVG 79mHg   Arthritis    Asthma    Complication of anesthesia    Coronary artery disease    Diabetes mellitus without complication ([place] [id number]  Qualifier: Diagnosis of  By: SCouncil MechanicMD, RHilaria Ota   Disorder of bone and cartilage 12/20/2007   2016 DXA: -1.4.  Consider repeat in 2021.      Dyspnea    Exertional shortness of breath 01/02/2020   Female cystocele 08/10/2012   Health care maintenance 02/09/2018   Hemorrhoids    Hypertension    Left shoulder pain 07/20/2016   Medicare annual wellness visit, subsequent 01/03/2012   MENOPAUSE, SURGICAL 12/16/2009   Qualifier: Diagnosis of  By: LLurlean NannyLPN, Regina     PONV (postoperative nausea and vomiting)    Pure hypercholesterolemia 03/30/2011   RENAL CALCULUS 05/24/2007   Qualifier: Diagnosis of  By: SCouncil MechanicMD, RHilaria Ota   RHINITIS 12/24/2010    Qualifier: Diagnosis of  By: DDamita DunningsMD, Graham     Sleep apnea    Snoring 01/02/2020   Ureteral stone with hydronephrosis 08/02/2012   Past Surgical History:  Procedure Laterality Date   ABDOMINAL HYSTERECTOMY     BASAL CELL CARCINOMA EXCISION  08/23/2017   left upper arm   CARPAL TUNNEL RELEASE     with bilateral releases   CORONARY ATHERECTOMY N/A 02/26/2020   Procedure: CORONARY ATHERECTOMY;  Surgeon: CSherren Mocha MD;  [facility];  Service: Cardiovascular;  Laterality: N/A;   CYSTECTOMY     Left breast, right lumpectomy B9   INGUINAL HERNIA REPAIR     Right (Dr. YAnnamaria Boots   [place] AND COR[place]ARY ANGIOGRAPHY N/A 02/22/2020   Procedure: LEFT HEART CATH AND CORONARY ANGIOGRAPHY;  Surgeon: CSherren Mocha MD;  [facility];  Service: Cardiovascular;  Laterality: N/A;   NSVD     X 2   TVH with vaginal prolapse  08/29/09   Transvag Tape w/Varitensor, Ant & Post Colporraphy, Uterosacral Lig susp, McCall Culdoplasty   ueteroscopy  11/01   without laser secondary stone   Urological procedure  10/22 - 08/31/09   [facility]   Social History   Socioeconomic History   Marital status: Married    Spouse name: Not on file   Number of children: 2   Years of education: Not on file  Highest education level: Not on file  Occupational History   Occupation: Retired Pharmacist, hospital (primary education) from the [employer]    Employer: RETIRED  Tobacco Use   Smoking status: Never   Smokeless tobacco: Never  Vaping Use   Vaping Use: Never used  Substance and Sexual Activity   Alcohol use: Yes    Alcohol/week: 2.0 - 3.0 standard drinks    Types: 2 - 3 Glasses of wine per week    Comment: occas.   Drug use: No   Sexual activity: Not Currently  Other Topics Concern   Not on file  Social History Narrative   Retired Pharmacist, hospital   Marital Status: Married, 1968   Children: 2 children out of the home   Social Determinants of Health   Financial  Resource Strain: Low Risk    Difficulty of Paying Living Expenses: Not hard at all  Food Insecurity: No Food Insecurity   Worried About Charity fundraiser in the Last Year: Never true   Arboriculturist in the Last Year: Never true  Transportation Needs: No Transportation Needs   Lack of Transportation (Medical): No   Lack of Transportation (Non-Medical): No  Physical Activity: Insufficiently Active   Days of Exercise per Week: 7 days   Minutes of Exercise per Session: 20 min  Stress: No Stress Concern Present   Feeling of Stress : Not at all  Social Connections: Moderately Integrated   Frequency of Communication with Friends and Family: More than three times a week   Frequency of Social Gatherings with Friends and Family: More than three times a week   Attends Religious Services: More than 4 times per year   Active Member of [employer] or Organizations: No   Attends Music therapist: Never   Marital Status: Married   Family History  Problem Relation Age of Onset   Hypertension Mother    Heart disease Mother        CHF   Breast cancer Cousin        Breast CA   Alcohol abuse Cousin    Stroke Maternal Aunt    Diabetes Paternal Aunt 74   Stroke Paternal Aunt    Heart disease Paternal Uncle        MI   Heart disease Paternal Uncle        MI   Thyroid disease Other    Prostate cancer Son    Colon cancer Neg Hx    Allergies  Allergen Reactions   Sulfonamide Derivatives Other (See Comments)    REACTION: unspecified Childhood   Ciprofloxin Hcl [Ciprofloxacin] Rash   Penicillins Rash   Prior to Admission medications   Medication Sig Start Date End Date Taking? Authorizing Provider  ACCU-CHEK AVIVA PLUS test strip CHECK BLOOD SUGAR FOUR TIMES DAILY AS NEEDED 03/25/21  Yes Tonia Ghent, MD  ACCU-CHEK FASTCLIX LANCETS MISC Use to check blood sugar 4 times daily as needed.  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 10/28/14  Yes Tonia Ghent, MD  aspirin EC 81 MG  tablet Take 1 tablet (81 mg total) by mouth daily. 02/18/20  Yes Turner, Eber Hong, MD  atorvastatin (LIPITOR) 20 MG tablet Take 1 tablet (20 mg total) by mouth daily. 05/21/21  Yes Turner, Eber Hong, MD  Blood Glucose Monitoring Suppl (ACCU-CHEK AVIVA PLUS) W/DEVICE KIT Use to check blood sugar 4 times daily as needed.  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   Yes [provider]  clopidogrel (PLAVIX) 75 MG tablet Take 1 tablet (75 mg total) by mouth daily. 05/21/21  Yes Turner, Eber Hong, MD  conjugated estrogens (PREMARIN) vaginal cream Place 1 applicator vaginally 2 (two) times a week. 12/20/19  Yes [provider]  hydrochlorothiazide (HYDRODIURIL) 25 MG tablet Take 1 tablet (25 mg total) by mouth daily. 05/21/21  Yes Turner, Traci R, MD  insulin detemir (LEVEMIR FLEXTOUCH) 100 UNIT/ML FlexPen INJECT 45 UNITS SUBCUTANEOUSLY AT BEDTIME Patient taking differently: Inject 46 Units into the skin every evening. 09/15/21  Yes Tonia Ghent, MD  Insulin Pen Needle (B-D ULTRAFINE III SHORT PEN) 31G X 8 MM MISC USE AS DIRECTED 07/29/21  Yes Tonia Ghent, MD  lisinopril (ZESTRIL) 20 MG tablet Take 1 tablet (20 mg total) by mouth daily. 11/27/21  Yes Tonia Ghent, MD  metFORMIN (GLUCOPHAGE) 1000 MG tablet TAKE 1 TABLET BY MOUTH TWICE DAILY WITH MEALS 01/20/21  Yes Tonia Ghent, MD  nitroGLYCERIN (NITROSTAT) 0.4 MG SL tablet Place 1 tablet (0.4 mg total) under the tongue every 5 (five) minutes as needed. 05/21/21 11/20/21 Yes Turner, Eber Hong, MD  Probiotic Product (ALIGN) 4 MG CAPS Take 4 mg by mouth daily.    Yes [provider]  Psyllium (METAMUCIL PO) Take 1 Scoop by mouth daily as needed (Constipation).   Yes [provider]  vitamin C (ASCORBIC ACID) 500 MG tablet Take 500 mg by mouth daily.   Yes [provider]   No results found.  Positive  ROS: All other systems have been reviewed and were otherwise negative with the exception of those mentioned in the HPI and as above.  Physical Exam: General: Alert, no acute distress Cardiovascular: No pedal edema Respiratory: No cyanosis, no use of accessory musculature GI: No organomegaly, abdomen is soft and non-tender Skin: No lesions in the area of chief complaint Neurologic: Sensation intact distally Psychiatric: Patient is competent for consent with normal mood and affect Lymphatic: No axillary or cervical lymphadenopathy  MUSCULOSKELETAL: Right upper extremity is warm and well-perfused with no open wounds or lesions.  Neurovascular intact.  Assessment: Right shoulder rotator cuff arthropathy  Plan: -Plan will be to proceed today with reverse arthroplasty of the right shoulder.  We again reviewed the risk of bleeding, infection, damage to surrounding nerves or vessels, dislocation, fracture need for revision surgery, the risk of anesthesia.  She has provided informed consent.  -Plan will be to discharge home postoperatively from PACU.  She will follow-up with me in 2 weeks in the office.    Nicholes Stairs, MD Cell [phone]    12/18/2021 12:00 PM

## 2021-12-18 NOTE — Progress Notes (Signed)
Assisted Dr. Ambrose Pancoast with right, ultrasound guided, interscalene  block. Side rails up, monitors on throughout procedure. See vital signs in flow sheet. Tolerated Procedure well.

## 2021-12-21 NOTE — Anesthesia Postprocedure Evaluation (Signed)
Anesthesia Post Note  Patient: Gloria [name]  Procedure(s) Performed: REVERSE SHOULDER ARTHROPLASTY (Right: Shoulder)     Patient location during evaluation: PACU Anesthesia Type: General Level of consciousness: awake and alert Pain management: pain level controlled Vital Signs Assessment: post-procedure vital signs reviewed and stable Respiratory status: spontaneous breathing, nonlabored ventilation, respiratory function stable and patient connected to nasal cannula oxygen Cardiovascular status: blood pressure returned to baseline and stable Postop Assessment: no apparent nausea or vomiting Anesthetic complications: no   No notable events documented.  Last Vitals:  Vitals:   12/18/21 1515 12/18/21 1545  BP: 134/77 135/73  Pulse: 66 68  Resp: 13 16  Temp: (!) 36.4 C   SpO2: 93% 96%    Last Pain:  Vitals:   12/18/21 1545  TempSrc:   PainSc: 0-No pain                 Jad Johansson

## 2021-12-24 ENCOUNTER — Encounter: Payer: Medicare PPO | Admitting: Family Medicine

## 2021-12-29 ENCOUNTER — Encounter (HOSPITAL_COMMUNITY): Payer: Self-pay | Admitting: Orthopedic Surgery

## 2021-12-29 DIAGNOSIS — Z4789 Encounter for other orthopedic aftercare: Secondary | ICD-10-CM | POA: Diagnosis not present

## 2022-01-26 DIAGNOSIS — Z4789 Encounter for other orthopedic aftercare: Secondary | ICD-10-CM | POA: Diagnosis not present

## 2022-01-28 ENCOUNTER — Encounter: Payer: Self-pay | Admitting: Cardiology

## 2022-02-01 ENCOUNTER — Other Ambulatory Visit: Payer: Self-pay | Admitting: Family Medicine

## 2022-02-07 ENCOUNTER — Other Ambulatory Visit: Payer: Self-pay | Admitting: Family Medicine

## 2022-02-07 DIAGNOSIS — E119 Type 2 diabetes mellitus without complications: Secondary | ICD-10-CM

## 2022-02-08 ENCOUNTER — Other Ambulatory Visit (INDEPENDENT_AMBULATORY_CARE_PROVIDER_SITE_OTHER): Payer: Medicare PPO

## 2022-02-08 DIAGNOSIS — E119 Type 2 diabetes mellitus without complications: Secondary | ICD-10-CM | POA: Diagnosis not present

## 2022-02-08 LAB — VITAMIN D 25 HYDROXY (VIT D DEFICIENCY, FRACTURES): VITD: 47.46 ng/mL (ref 30.00–100.00)

## 2022-02-08 LAB — COMPREHENSIVE METABOLIC PANEL
ALT: 18 U/L (ref 0–35)
AST: 19 U/L (ref 0–37)
Albumin: 4.3 g/dL (ref 3.5–5.2)
Alkaline Phosphatase: 78 U/L (ref 39–117)
BUN: 19 mg/dL (ref 6–23)
CO2: 27 mEq/L (ref 19–32)
Calcium: 11 mg/dL — ABNORMAL HIGH (ref 8.4–10.5)
Chloride: 99 mEq/L (ref 96–112)
Creatinine, Ser: 0.51 mg/dL (ref 0.40–1.20)
GFR: 90.69 mL/min (ref >60.00)
Glucose, Bld: 109 mg/dL — ABNORMAL HIGH (ref 70–99)
Potassium: 4.2 mEq/L (ref 3.5–5.1)
Sodium: 136 mEq/L (ref 135–145)
Total Bilirubin: 1.4 mg/dL — ABNORMAL HIGH (ref 0.2–1.2)
Total Protein: 6.8 g/dL (ref 6.0–8.3)

## 2022-02-08 LAB — LIPID PANEL
Cholesterol: 120 mg/dL (ref 0–200)
HDL: 35.2 mg/dL — ABNORMAL LOW (ref >39.00)
LDL Cholesterol: 67 mg/dL (ref 0–99)
NonHDL: 84.97
Total CHOL/HDL Ratio: 3
Triglycerides: 90 mg/dL (ref 0.0–149.0)
VLDL: 18 mg/dL (ref 0.0–40.0)

## 2022-02-08 LAB — HEMOGLOBIN A1C: Hgb A1c MFr Bld: 7.3 % — ABNORMAL HIGH (ref 4.6–6.5)

## 2022-02-12 ENCOUNTER — Encounter: Payer: Medicare PPO | Admitting: Family Medicine

## 2022-02-15 ENCOUNTER — Ambulatory Visit (INDEPENDENT_AMBULATORY_CARE_PROVIDER_SITE_OTHER): Payer: Medicare PPO | Admitting: Family Medicine

## 2022-02-15 ENCOUNTER — Encounter: Payer: Self-pay | Admitting: Family Medicine

## 2022-02-15 ENCOUNTER — Encounter: Payer: Medicare PPO | Admitting: Family Medicine

## 2022-02-15 DIAGNOSIS — Z7189 Other specified counseling: Secondary | ICD-10-CM

## 2022-02-15 DIAGNOSIS — Z780 Asymptomatic menopausal state: Secondary | ICD-10-CM

## 2022-02-15 DIAGNOSIS — E7800 Pure hypercholesterolemia, unspecified: Secondary | ICD-10-CM

## 2022-02-15 DIAGNOSIS — E119 Type 2 diabetes mellitus without complications: Secondary | ICD-10-CM

## 2022-02-15 DIAGNOSIS — Z0000 Encounter for general adult medical examination without abnormal findings: Secondary | ICD-10-CM

## 2022-02-15 MED ORDER — LEVEMIR FLEXTOUCH 100 UNIT/ML ~~LOC~~ SOPN
46.0000 [IU] | PEN_INJECTOR | Freq: Every evening | SUBCUTANEOUS | Status: DC
Start: 2022-02-15 — End: 2022-08-03

## 2022-02-15 MED ORDER — METFORMIN HCL 1000 MG PO TABS: 1000.0000 mg | ORAL_TABLET | Freq: Two times a day (BID) | ORAL | 3 refills | Status: DC

## 2022-02-15 NOTE — Patient Instructions (Addendum)
Stop HCTZ for about 10 days and then come in for nonfasting labs.  Please schedule that on the way out.   ? ?If labs are normal---- consider lower dose of HCTZ and potentially recheck labs.   ?If abnormal---- we'll address that.   ? ?Please call mammogram site about doing your bone density test.   ?Plan on recheck in about 4 months with A1c at a visit.   ?Take care.  Glad to see you. ?

## 2022-02-15 NOTE — Progress Notes (Signed)
Diabetes:  ?Using medications without difficulties:yes ?Hypoglycemic episodes: no ?Hyperglycemic episodes:no ?Feet problems: no ?Blood Sugars averaging: 112 this AM, this is higher typical.   ?eye exam within last year: yes, 12/15/21.   ? ?Elevated calcium.  This could be due to hydrochlorothiazide use.  Previous PTH and calcium were normal when checked last year.  Discussed parathyroid hormone/parathyroid pathophysiology and calcium metabolism.  Discussed plan. ? ?Elevated Cholesterol: ?Using medications without problems:yes ?Muscle aches: no  ?Diet compliance: yes ?Exercise: yes ? ?She had her R shoulder surgery.  Her ROM is better.  No pain unless at the extreme of ROM. She is still doing home exercises and make progress.   ? ?She is using oral appliance for sleep apnea.   ? ?Vaccines discussed with patient. ?Mammogram - pending.  ?Colonoscopy 2020 ?DXA 2016.  Ordered 2023.  ?Diet and exercise d/w pt.   ?Advance directive d/w pt- husband designated if patient were incapacitated.   ? ?PMH and SH reviewed ?Meds, vitals, and allergies reviewed.  ? ?ROS: Per HPI unless specifically indicated in ROS section  ? ?GEN: nad, alert and oriented ?HEENT: ncat ?NECK: supple w/o LA ?CV: rrr. ?PULM: ctab, no inc wob ?ABD: soft, +bs ?EXT: no edema ?SKIN: no acute rash ? ?Diabetic foot exam: ?Normal inspection ?No skin breakdown ?No calluses  ?Normal DP pulses ?Normal sensation to light touch and monofilament ?Nails normal ?

## 2022-02-18 NOTE — Assessment & Plan Note (Signed)
A1c reasonable.  Continue statin insulin and metformin.  Recheck periodically. ?

## 2022-02-18 NOTE — Assessment & Plan Note (Signed)
Continue atorvastatin.  Continue work on diet and exercise.  She agrees to plan. ?

## 2022-02-18 NOTE — Assessment & Plan Note (Signed)
Stop HCTZ for about 10 days and then come in for nonfasting labs.   ? ?If labs are normal---- consider lower dose of HCTZ and potentially recheck labs.   ?If abnormal---- we'll address based on results.  She agrees to plan. ?

## 2022-02-18 NOTE — Assessment & Plan Note (Signed)
Advance directive d/w pt- husband designated if patient were incapacitated.   ?

## 2022-02-18 NOTE — Assessment & Plan Note (Signed)
Vaccines discussed with patient. ?Mammogram - pending.  ?Colonoscopy 2020 ?DXA 2016.  Ordered 2023.  ?Diet and exercise d/w pt.   ?Advance directive d/w pt- husband designated if patient were incapacitated.   ?

## 2022-02-25 ENCOUNTER — Other Ambulatory Visit (INDEPENDENT_AMBULATORY_CARE_PROVIDER_SITE_OTHER): Payer: Medicare PPO

## 2022-02-26 ENCOUNTER — Encounter: Payer: Self-pay | Admitting: Family Medicine

## 2022-02-26 ENCOUNTER — Telehealth: Payer: Self-pay | Admitting: *Deleted

## 2022-02-26 ENCOUNTER — Telehealth: Payer: Self-pay

## 2022-02-26 DIAGNOSIS — I2510 Atherosclerotic heart disease of native coronary artery without angina pectoris: Secondary | ICD-10-CM | POA: Diagnosis not present

## 2022-02-26 DIAGNOSIS — E119 Type 2 diabetes mellitus without complications: Secondary | ICD-10-CM | POA: Diagnosis not present

## 2022-02-26 DIAGNOSIS — I10 Essential (primary) hypertension: Secondary | ICD-10-CM | POA: Diagnosis not present

## 2022-02-26 DIAGNOSIS — Z955 Presence of coronary angioplasty implant and graft: Secondary | ICD-10-CM | POA: Insufficient documentation

## 2022-02-26 DIAGNOSIS — G4733 Obstructive sleep apnea (adult) (pediatric): Secondary | ICD-10-CM | POA: Diagnosis not present

## 2022-02-26 DIAGNOSIS — R011 Cardiac murmur, unspecified: Secondary | ICD-10-CM | POA: Diagnosis not present

## 2022-02-26 DIAGNOSIS — N993 Prolapse of vaginal vault after hysterectomy: Secondary | ICD-10-CM | POA: Diagnosis not present

## 2022-02-26 NOTE — Telephone Encounter (Signed)
? ?  Pre-operative Risk Assessment  ?  ?Patient Name: Gloria [name]  ?DOB: September 10, 1946 ?MRN: [id number]  ? ?  ? ?Request for Surgical Clearance   ? ?Procedure:   RIGHT SACROSPINOUS LIGAMENT SUSPENSION, CYSTARTHROSCOPY ? ?Date of Surgery:  Clearance 03/05/22  STAT                         ?   ?Surgeon:  DR. Blima Rich ?Surgeon's Group or Practice Name:  [facility] ?Phone number:  [phone] ?Fax number:  [phone] ?  ?Type of Clearance Requested:   ?- Medical  ?- Pharmacy:  Hold Aspirin and Clopidogrel (Plavix)   ?  ?Type of Anesthesia:  General  ?  ?Additional requests/questions:   ? ?Signed, ?Julaine Hua   ?02/26/2022, 10:11 AM  ? ?

## 2022-02-26 NOTE — Telephone Encounter (Signed)
Gloria [name] [age] year old female is requesting preoperative cardiac evaluation for Right SACROSPINOUS LIGAMENT SUSPENSION, CYSTARTHROSCOPY.  She was last seen in the clinic on 05/21/2021.  During that time she continued to do well.  She underwent LHC with PCI and DES to her mid RCA 4/21.  She was continued on her dual antiplatelet therapy and atorvastatin.  Her  echocardiogram for aortic stenosis showed moderate AS and is due to be repeated 7/23. ?  ?Her PMH includes CAD, aortic stenosis, type 2 diabetes, HTN, and HLD. ?  ?  ?May her Plavix and aspirin be held prior to her procedure? ?  ?  ?Thank you for your help.  Please direct your response to [facility] preop pool. ? ?Jossie Ng. Lively Haberman NP-C ? ?  ?02/26/2022, 1:22 PM ?[facility] ?[address] ?Office [phone] Fax [phone] ? ?

## 2022-02-26 NOTE — Telephone Encounter (Signed)
Pt is scheduled to have her BMD at [facility] on Monday at 11am. They told her they have faxed an order request 3 times. Please have Dr Damita Dunnings sign this and fax back. Let pt know when completed. Thanks ?

## 2022-02-27 NOTE — Telephone Encounter (Signed)
Have called patient apologized for the confusion with order. Let her know that I will have Dr. Damita Dunnings address Monday as soon as he comes in the office and have sent off. Will also leave copy at reception for her to pick up on her way to appointment so eliminate any confusion.  ?

## 2022-03-01 ENCOUNTER — Other Ambulatory Visit: Payer: Self-pay

## 2022-03-01 ENCOUNTER — Encounter: Payer: Self-pay | Admitting: Family Medicine

## 2022-03-01 ENCOUNTER — Ambulatory Visit (HOSPITAL_COMMUNITY)
Admission: RE | Admit: 2022-03-01 | Discharge: 2022-03-01 | Disposition: A | Discharge status: Home / Self Care | Payer: Medicare PPO | Source: Ambulatory Visit | Attending: Cardiology | Admitting: Cardiology

## 2022-03-01 ENCOUNTER — Encounter: Payer: Self-pay | Admitting: Cardiology

## 2022-03-01 DIAGNOSIS — I350 Nonrheumatic aortic (valve) stenosis: Secondary | ICD-10-CM

## 2022-03-01 DIAGNOSIS — I083 Combined rheumatic disorders of mitral, aortic and tricuspid valves: Secondary | ICD-10-CM | POA: Insufficient documentation

## 2022-03-01 DIAGNOSIS — I10 Essential (primary) hypertension: Secondary | ICD-10-CM | POA: Insufficient documentation

## 2022-03-01 DIAGNOSIS — Z1231 Encounter for screening mammogram for malignant neoplasm of breast: Secondary | ICD-10-CM | POA: Diagnosis not present

## 2022-03-01 DIAGNOSIS — E119 Type 2 diabetes mellitus without complications: Secondary | ICD-10-CM | POA: Diagnosis not present

## 2022-03-01 DIAGNOSIS — Z780 Asymptomatic menopausal state: Secondary | ICD-10-CM

## 2022-03-01 DIAGNOSIS — M85852 Other specified disorders of bone density and structure, left thigh: Secondary | ICD-10-CM | POA: Diagnosis not present

## 2022-03-01 LAB — ECHOCARDIOGRAM COMPLETE
AR max vel: 1.36 cm2
AV Area VTI: 1.47 cm2
AV Area mean vel: 1.55 cm2
AV Mean grad: 25 mmHg
AV Peak grad: 45.2 mmHg
Ao pk vel: 3.36 m/s
Area-P 1/2: 2.39 cm2
MV VTI: 3.29 cm2
S' Lateral: 2.7 cm

## 2022-03-01 LAB — HM DEXA SCAN

## 2022-03-01 NOTE — Telephone Encounter (Signed)
Order has been faxed and patient is aware. Patient also wanted to pick up copy of order to take with her just in case. I have placed a copy of order for patient to pick up.  ?

## 2022-03-01 NOTE — Telephone Encounter (Signed)
Per Dr. Radford Pax: ?Okay to hold DAPT for surgery but I would like her to have an echo prior to surgery to make sure her AAS is stable  ? ? ?[facility] - can  you please arrange an echo? Surgery is 03/05/22 - she should already be holding DAPT, please confirm with her. ? ? ?

## 2022-03-01 NOTE — Telephone Encounter (Signed)
This has already been taking care of this morning.  ?

## 2022-03-01 NOTE — Telephone Encounter (Signed)
[facility] - Client ?Nonclinical Telephone Record  ?[facility]? ?Client [facility] - Client ?Client Site [place] ?Provider Renford Dills - MD ?Contact Type Call ?Who Is Calling Patient / Member / Family / Caregiver ?Caller Name Desere Gwin ?Caller Phone Number [phone] ?Patient Name Gloria [name] ?Patient DOB Jun 02, 1946 ?Call Type Message Only Information Provided ?Reason for Call Request for General Office Information ?Initial Comment Caller states that she scheduled an appt for a bone density test and the office has that she is ?going to for that test does not have the orders and they need that before her appt monday at ?11am. ?Additional Comment Caller states that she scheduled an appt for a bone density test and the office has that she is ?going to for that test does not have the orders and they need that before her appt monday at ?[facility] would like to come to the office on monday morning to get that order if possible. ?Callers secondary number is [phone] ?Disp. Time Disposition Final User ?02/26/2022 5:08:41 PM General Information Provided Yes Virgil Benedict ?Call Closed By: Virgil Benedict ?Transaction Date/Time: 02/26/2022 5:04:06 PM (ET ?

## 2022-03-01 NOTE — Telephone Encounter (Signed)
Pt has been scheduled for echo today at 2 pm. Per Dr. Radford Pax needs echo before clearance.  ?

## 2022-03-01 NOTE — Telephone Encounter (Addendum)
I s/w the pt and she has started holding Plavix as of Saturday. She states she did not know about ASA needed to be held. Pt states she took ASA last night. I instructed her to not take anymore ASA until she has post procedure, which at that time the surgeon will let her know when safe to resume ASA and Plavix. Pt has an echo today at 2 pm per Dr. Radford Pax.  ? ?Will send back to pre op to watch for results on echo for clearance.  ?

## 2022-03-01 NOTE — Telephone Encounter (Signed)
I have sent a message to our echo scheduler Simeon Craft to see if she may be able to get echo scheduled for echo pre Dr. Radford Pax.  ? ?

## 2022-03-01 NOTE — Progress Notes (Signed)
?  Echocardiogram ?2D Echocardiogram has been performed. ? ?Gloria [name] ?03/01/2022, 2:21 PM ?

## 2022-03-02 ENCOUNTER — Telehealth: Payer: Self-pay | Admitting: *Deleted

## 2022-03-02 NOTE — Telephone Encounter (Signed)
Pt has been scheduled for a tele visit, 03/03/22 9:00.  Consent on file, medications reconciled. ? ?  ?Patient Consent for Virtual Visit  ? ? ?   ? ?Gloria [name] has provided verbal consent on 03/02/2022 for a virtual visit (video or telephone). ? ? ?CONSENT FOR VIRTUAL VISIT FOR:  Gloria [name]  ?By participating in this virtual visit I agree to the following: ? ?I hereby voluntarily request, consent and authorize [facility] and its employed or contracted physicians, physician assistants, nurse practitioners or other licensed health care professionals (the Practitioner), to provide me with telemedicine health care services (the ?Services") as deemed necessary by the treating Practitioner. I acknowledge and consent to receive the Services by the Practitioner via telemedicine. I understand that the telemedicine visit will involve communicating with the Practitioner through live audiovisual communication technology and the disclosure of certain medical information by electronic transmission. I acknowledge that I have been given the opportunity to request an in-person assessment or other available alternative prior to the telemedicine visit and am voluntarily participating in the telemedicine visit. ? ?I understand that I have the right to withhold or withdraw my consent to the use of telemedicine in the course of my care at any time, without affecting my right to future care or treatment, and that the Practitioner or I may terminate the telemedicine visit at any time. I understand that I have the right to inspect all information obtained and/or recorded in the course of the telemedicine visit and may receive copies of available information for a reasonable fee.  I understand that some of the potential risks of receiving the Services via telemedicine include:  ?Delay or interruption in medical evaluation due to technological equipment failure or disruption; ?Information transmitted may not be sufficient (e.g. poor  resolution of images) to allow for appropriate medical decision making by the Practitioner; and/or  ?In rare instances, security protocols could fail, causing a breach of personal health information. ? ?Furthermore, I acknowledge that it is my responsibility to provide information about my medical history, conditions and care that is complete and accurate to the best of my ability. I acknowledge that Practitioner's advice, recommendations, and/or decision may be based on factors not within their control, such as incomplete or inaccurate data provided by me or distortions of diagnostic images or specimens that may result from electronic transmissions. I understand that the practice of medicine is not an exact science and that Practitioner makes no warranties or guarantees regarding treatment outcomes. I acknowledge that a copy of this consent can be made available to me via my patient portal ([place]), or I can request a printed copy by calling the office of [facility].   ? ?I understand that my insurance will be billed for this visit.  ? ?I have read or had this consent read to me. ?I understand the contents of this consent, which adequately explains the benefits and risks of the Services being provided via telemedicine.  ?I have been provided ample opportunity to ask questions regarding this consent and the Services and have had my questions answered to my satisfaction. ?I give my informed consent for the services to be provided through the use of telemedicine in my medical care ? ? ? ?

## 2022-03-02 NOTE — Telephone Encounter (Signed)
Pt has been scheduled for a telephone visit 03/03/22 9:00.   ?

## 2022-03-03 ENCOUNTER — Encounter: Payer: Self-pay | Admitting: Cardiology

## 2022-03-03 ENCOUNTER — Ambulatory Visit (INDEPENDENT_AMBULATORY_CARE_PROVIDER_SITE_OTHER): Payer: Medicare PPO | Admitting: Physician Assistant

## 2022-03-03 DIAGNOSIS — I2510 Atherosclerotic heart disease of native coronary artery without angina pectoris: Secondary | ICD-10-CM | POA: Diagnosis not present

## 2022-03-03 DIAGNOSIS — I350 Nonrheumatic aortic (valve) stenosis: Secondary | ICD-10-CM

## 2022-03-03 DIAGNOSIS — Z01810 Encounter for preprocedural cardiovascular examination: Secondary | ICD-10-CM | POA: Diagnosis not present

## 2022-03-03 NOTE — Telephone Encounter (Signed)
Noted. Thanks.  I have the results to review.   ?

## 2022-03-03 NOTE — Progress Notes (Signed)
? ?Virtual Visit via Telephone Note  ? ?This visit type was conducted due to national recommendations for restrictions regarding the COVID-19 Pandemic (e.g. social distancing) in an effort to limit this patient's exposure and mitigate transmission in our community.  Due to her co-morbid illnesses, this patient is at least at moderate risk for complications without adequate follow up.  This format is felt to be most appropriate for this patient at this time.  The patient did not have access to video technology/had technical difficulties with video requiring transitioning to audio format only (telephone).  All issues noted in this document were discussed and addressed.  No physical exam could be performed with this format.  Please refer to the patient's chart for her  consent to telehealth for [facility]. ? ?Evaluation Performed:  Preoperative cardiovascular risk assessment ?_____________  ? ?Date:  03/03/2022  ? ?Patient ID:  Gloria [name], Gloria [name] 12/11/1945, MRN [id number] ?Patient Location:  ?Home ?Provider location:   ?Office ? ?Primary Care Provider:  Tonia Ghent, MD ?Primary Cardiologist:  Fransico Him, MD ? ?Chief Complaint  ?  ?76 y.o. y/o female with a h/o DM, aortic stenosis, CAD s/p DES to RCA, HTN and HLD who is pending RIGHT SACROSPINOUS LIGAMENT SUSPENSION, CYSTARTHROSCOPY, and presents today for telephonic preoperative cardiovascular risk assessment. ? ?Past Medical History  ?  ?Past Medical History:  ?Diagnosis Date  ? ABDOMINAL PAIN OTHER SPECIFIED SITE 08/27/2010  ? Qualifier: Diagnosis of  By: Damita Dunnings MD, Phillip Heal    ? Advance directive discussed with patient 01/03/2012  ? 12/2011-AD d/w pt.  She has talked to husband but doesn't have living will yet.  Full code in discussion with patient today.  Would not want prolonged interventions if she were profoundly and/or permanently impaired, ie severe dementia or a condition with no hope of improvement.   01/13/15-Advance directive d/w pt- husband  designated if patient were incapacitated.    ? Allergic rhinitis   ? Aortic stenosis   ? Moderate by echo 02/2022 with mean aortic valve gradient 25 mmHg  ? Arthritis   ? Asthma   ? Complication of anesthesia   ? Coronary artery disease   ? Diabetes mellitus without complication ([place]) 45/80/9983  ? Qualifier: Diagnosis of  By: Council Mechanic MD, Hilaria Ota   ? Disorder of bone and cartilage 12/20/2007  ? 2016 DXA: -1.4.  Consider repeat in 2021.     ? Dyspnea   ? Exertional shortness of breath 01/02/2020  ? Female cystocele 08/10/2012  ? Health care maintenance 02/09/2018  ? Hemorrhoids   ? Hypertension   ? Left shoulder pain 07/20/2016  ? Medicare annual wellness visit, subsequent 01/03/2012  ? MENOPAUSE, SURGICAL 12/16/2009  ? Qualifier: Diagnosis of  By: Lurlean Nanny LPN, Regina    ? PONV (postoperative nausea and vomiting)   ? Pure hypercholesterolemia 03/30/2011  ? RENAL CALCULUS 05/24/2007  ? Qualifier: Diagnosis of  By: Council Mechanic MD, Hilaria Ota   ? RHINITIS 12/24/2010  ? Qualifier: Diagnosis of  By: Damita Dunnings MD, Phillip Heal    ? Sleep apnea   ? Snoring 01/02/2020  ? Ureteral stone with hydronephrosis 08/02/2012  ? ?Past Surgical History:  ?Procedure Laterality Date  ? ABDOMINAL HYSTERECTOMY    ? BASAL CELL CARCINOMA EXCISION  08/23/2017  ? left upper arm  ? CARPAL TUNNEL RELEASE    ? with bilateral releases  ? CORONARY ATHERECTOMY N/A 02/26/2020  ? Procedure: CORONARY ATHERECTOMY;  Surgeon: [name], MD;  Location: [facility];  Service:  Cardiovascular;  Laterality: N/A;  ? CYSTECTOMY    ? Left breast, right lumpectomy B9  ? INGUINAL HERNIA REPAIR    ? Right (Dr. Annamaria Boots)  ? LEFT HEART CATH AND CORONARY ANGIOGRAPHY N/A 02/22/2020  ? Procedure: LEFT HEART CATH AND CORONARY ANGIOGRAPHY;  Surgeon: [name], MD;  Location: [facility];  Service: Cardiovascular;  Laterality: N/A;  ? NSVD    ? X 2  ? REVERSE SHOULDER ARTHROPLASTY Right 12/18/2021  ? Procedure: REVERSE SHOULDER ARTHROPLASTY;  Surgeon: Nicholes Stairs, MD;  Location: [facility];  Service: Orthopedics;  Laterality: Right;  150  ? TVH with vaginal prolapse  08/29/09  ? Transvag Tape w/Varitensor, Ant & Post Colporraphy, Uterosacral Lig susp, McCall Culdoplasty  ? ueteroscopy  11/01  ? without laser secondary stone  ? Urological procedure  10/22 - 08/31/09  ? [facility]  ? ? ?Allergies ? ?Allergies  ?Allergen Reactions  ? Sulfonamide Derivatives Other (See Comments)  ?  REACTION: unspecified ?Childhood  ? Ciprofloxin Hcl [Ciprofloxacin] Rash  ? Penicillins Rash  ? ? ?History of Present Illness  ?  ?Gloria [name] is a 76 y.o. female who presents via audio/video conferencing for a telehealth visit today.  Pt was last seen in cardiology clinic on 05/2021 by Dr. Radford Pax.  At that time Gloria [name] was doing well.  The patient is now pending RIGHT SACROSPINOUS LIGAMENT SUSPENSION, CYSTARTHROSCOPY.  Since her last visit, she well.. ? ?She walks 1 miles 3-4 days/week without any limitations. The patient denies nausea, vomiting, fever, chest pain, palpitations, shortness of breath, orthopnea, PND, dizziness, syncope, cough, congestion, abdominal pain, hematochezia, melena, lower extremity edema.  ? ?We went over recent echo 03/01/22.  ?1. Left ventricular ejection fraction, by estimation, is 65 to 70%. The  ?left ventricle has normal function. The left ventricle has no regional  ?wall motion abnormalities. There is moderate concentric left ventricular  ?hypertrophy. Left ventricular  ?diastolic parameters are consistent with Grade I diastolic dysfunction  ?(impaired relaxation).  ? 2. Right ventricular systolic function is normal. The right ventricular  ?size is normal. There is normal pulmonary artery systolic pressure. The  ?estimated right ventricular systolic pressure is 98.3 mmHg.  ? 3. Left atrial size was moderately dilated.  ? 4. The mitral valve is abnormal. Trivial mitral valve regurgitation.  ?Moderate mitral annular calcification.  ? 5. The aortic valve is  calcified. There is severe calcifcation of the  ?aortic valve. There is severe thickening of the aortic valve. Aortic valve  ?regurgitation is trivial. Moderate aortic valve stenosis. AVA 1.2cm2,  ?Aortic valve mean gradient measures  ?25.0 mmHg. Aortic valve Vmax measures 3.36 m/s, DI 0.35.  ? 6. The inferior vena cava is normal in size with greater than 50%  ?respiratory variability, suggesting right atrial pressure of 3 mmHg.  ? ?Comparison(s): Compared to prior TTE in 05/2021, the aortic valve gradient  ?has increased from 20.7mHg to 223mg; Vmax has increased to 3.64m358mfrom  ?3.58m/59m ? ? ?Home Medications  ?  ?Prior to Admission medications   ?Medication Sig Start Date End Date Taking? Authorizing Provider  ?ACCU-CHEK AVIVA PLUS test strip CHECK BLOOD SUGAR FOUR TIMES DAILY AS NEEDED 02/01/22   DuncTonia Ghent  ?ACCU-CHEK FASTCLIX LANCETS MISC Use to check blood sugar 4 times daily as needed.  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 10/28/14   DuncTonia Ghent  ?aspirin EC 81 MG tablet Take 1 tablet (81 mg total) by mouth daily. 02/18/20  Sueanne Margarita, MD  ?atorvastatin (LIPITOR) 20 MG tablet Take 1 tablet (20 mg total) by mouth daily. 05/21/21   Sueanne Margarita, MD  ?Blood Glucose Monitoring Suppl (ACCU-CHEK AVIVA PLUS) W/DEVICE KIT Use to check blood sugar 4 times daily as needed.  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    [provider]  ?clopidogrel (PLAVIX) 75 MG tablet Take 1 tablet (75 mg total) by mouth daily. 05/21/21   Sueanne Margarita, MD  ?conjugated estrogens (PREMARIN) vaginal cream Place 1 applicator vaginally 2 (two) times a week. 12/20/19   [provider]  ?hydrochlorothiazide (HYDRODIURIL) 25 MG tablet Take 1 tablet (25 mg total) by mouth daily. 05/21/21   Sueanne Margarita, MD  ?insulin detemir (LEVEMIR FLEXTOUCH) 100 UNIT/ML FlexPen Inject 46 Units into  the skin every evening. 02/15/22   Tonia Ghent, MD  ?Insulin Pen Needle (B-D ULTRAFINE III SHORT PEN) 31G X 8 MM MISC USE AS DIRECTED 07/29/21   Tonia Ghent, MD  ?lisinopril (ZESTRIL) 20 MG tablet Take 1 ta

## 2022-03-05 DIAGNOSIS — G4733 Obstructive sleep apnea (adult) (pediatric): Secondary | ICD-10-CM | POA: Diagnosis not present

## 2022-03-05 DIAGNOSIS — N393 Stress incontinence (female) (male): Secondary | ICD-10-CM | POA: Diagnosis not present

## 2022-03-05 DIAGNOSIS — N8111 Cystocele, midline: Secondary | ICD-10-CM | POA: Diagnosis not present

## 2022-03-05 DIAGNOSIS — I119 Hypertensive heart disease without heart failure: Secondary | ICD-10-CM | POA: Diagnosis not present

## 2022-03-05 DIAGNOSIS — R9431 Abnormal electrocardiogram [ECG] [EKG]: Secondary | ICD-10-CM | POA: Diagnosis not present

## 2022-03-05 DIAGNOSIS — I352 Nonrheumatic aortic (valve) stenosis with insufficiency: Secondary | ICD-10-CM | POA: Diagnosis not present

## 2022-03-05 DIAGNOSIS — I2510 Atherosclerotic heart disease of native coronary artery without angina pectoris: Secondary | ICD-10-CM | POA: Diagnosis not present

## 2022-03-05 DIAGNOSIS — N993 Prolapse of vaginal vault after hysterectomy: Secondary | ICD-10-CM | POA: Diagnosis not present

## 2022-03-05 DIAGNOSIS — Z955 Presence of coronary angioplasty implant and graft: Secondary | ICD-10-CM | POA: Diagnosis not present

## 2022-03-05 DIAGNOSIS — E1136 Type 2 diabetes mellitus with diabetic cataract: Secondary | ICD-10-CM | POA: Diagnosis not present

## 2022-03-06 ENCOUNTER — Encounter: Payer: Self-pay | Admitting: Family Medicine

## 2022-03-08 ENCOUNTER — Other Ambulatory Visit: Payer: Self-pay | Admitting: Family Medicine

## 2022-03-08 LAB — PTH, INTACT AND CALCIUM: Calcium: 9.9 mg/dL (ref 8.6–10.4)

## 2022-03-12 ENCOUNTER — Other Ambulatory Visit: Payer: Medicare PPO

## 2022-03-12 DIAGNOSIS — R922 Inconclusive mammogram: Secondary | ICD-10-CM | POA: Diagnosis not present

## 2022-03-12 LAB — HM MAMMOGRAPHY

## 2022-03-14 LAB — PTH, INTACT AND CALCIUM
Calcium: 10.7 mg/dL — ABNORMAL HIGH (ref 8.6–10.4)
PTH: 17 pg/mL (ref 16–77)

## 2022-03-17 ENCOUNTER — Telehealth: Payer: Self-pay | Admitting: Family Medicine

## 2022-03-17 DIAGNOSIS — L538 Other specified erythematous conditions: Secondary | ICD-10-CM | POA: Diagnosis not present

## 2022-03-17 DIAGNOSIS — D2262 Melanocytic nevi of left upper limb, including shoulder: Secondary | ICD-10-CM | POA: Diagnosis not present

## 2022-03-17 DIAGNOSIS — Z85828 Personal history of other malignant neoplasm of skin: Secondary | ICD-10-CM | POA: Diagnosis not present

## 2022-03-17 DIAGNOSIS — L570 Actinic keratosis: Secondary | ICD-10-CM | POA: Diagnosis not present

## 2022-03-17 DIAGNOSIS — D225 Melanocytic nevi of trunk: Secondary | ICD-10-CM | POA: Diagnosis not present

## 2022-03-17 DIAGNOSIS — D2261 Melanocytic nevi of right upper limb, including shoulder: Secondary | ICD-10-CM | POA: Diagnosis not present

## 2022-03-17 DIAGNOSIS — L820 Inflamed seborrheic keratosis: Secondary | ICD-10-CM | POA: Diagnosis not present

## 2022-03-17 NOTE — Telephone Encounter (Signed)
Pt came in office to drop off paperwork with blood pressure record . It was place in folder ?

## 2022-03-17 NOTE — Telephone Encounter (Signed)
Noted. Thanks.  Will check when I get back to clinic.  ?

## 2022-03-17 NOTE — Telephone Encounter (Signed)
Readings have been placed in Dr. Carole Civil inbox for review when he returns to the office.  ?

## 2022-03-17 NOTE — Telephone Encounter (Signed)
Please call patient after I reviewed her blood pressure readings.  Her calcium is once again elevated and I suspect it would be reasonable to talk to one of the endocrine surgeons to see if she has an overactive parathyroid gland.  We can set up a phone or office visit to talk about this if needed.  Thanks. ?

## 2022-03-18 NOTE — Telephone Encounter (Signed)
Called and discuss below with patient. She is fine with seeing an endocrine surgeon if needed and does not need to discuss this.  ?

## 2022-03-21 NOTE — Telephone Encounter (Addendum)
I think it makes sense to see Dr. Harlow Asa and I put in the referral.  Her BPs are reasonable and I would continue as is for now, ie off HCTZ.  Thanks.  ?

## 2022-03-22 NOTE — Addendum Note (Signed)
Addended by: Tonia Ghent on: 03/22/2022 12:01 AM ? ? Modules accepted: Orders ? ?

## 2022-03-23 DIAGNOSIS — Z96611 Presence of right artificial shoulder joint: Secondary | ICD-10-CM | POA: Diagnosis not present

## 2022-03-23 DIAGNOSIS — Z471 Aftercare following joint replacement surgery: Secondary | ICD-10-CM | POA: Diagnosis not present

## 2022-03-24 ENCOUNTER — Encounter: Payer: Self-pay | Admitting: Family Medicine

## 2022-03-30 ENCOUNTER — Ambulatory Visit: Payer: Medicare PPO | Admitting: Family Medicine

## 2022-03-30 ENCOUNTER — Encounter: Payer: Self-pay | Admitting: Family Medicine

## 2022-03-30 NOTE — Progress Notes (Unsigned)
BP controlled today, it was higher 330 systolic at dental clinic yesterday.  She is off hydrochlorothiazide.  She had questions about seeing general surgery.  We talked about the rationale to see Dr. Harlow Asa.  We also talked about calcium metabolism and parathyroid pathophysiology.  She had a history of high normal calcium for years that gradually escalated over the past few years.  She had occasional normal calcium values in the interval.  Given that her calcium remains elevated off hydrochlorothiazide I suspect that she does have an over functioning PTH gland and we talked about seeing a surgeon like Dr. Harlow Asa for extra evaluation and consideration of surgery if needed.  Discussed that I have all confidence in Dr. Harlow Asa as he has significant experience in dealing with this type of situation.  Meds, vitals, and allergies reviewed.   ROS: Per HPI unless specifically indicated in ROS section   Nad Ncat Neck supple, no LA Rrr  30 minutes were devoted to patient care in this encounter (this includes time spent reviewing the patient's file/history, interviewing and examining the patient, counseling/reviewing plan with patient).

## 2022-03-30 NOTE — Patient Instructions (Signed)
You should get a call from Dr. Harlow Asa.  You may need some extra testing through his office.  I will await his notes.  Take care.  Glad to see you.

## 2022-03-31 NOTE — Assessment & Plan Note (Signed)
See above.  Detailed conversation with patient.  I suspect she may need extra testing through general surgery and then consideration of parathyroidectomy, so I want general surgery input.  I thank all involved.

## 2022-04-01 ENCOUNTER — Encounter: Payer: Self-pay | Admitting: *Deleted

## 2022-04-13 DIAGNOSIS — N816 Rectocele: Secondary | ICD-10-CM | POA: Insufficient documentation

## 2022-04-13 NOTE — Telephone Encounter (Signed)
Patient states she called over to [facility] and they have no record of her referral  Please follow-up with the patient

## 2022-04-15 NOTE — Telephone Encounter (Signed)
See other mychart message, patient talked to Ashtyn already

## 2022-05-19 ENCOUNTER — Other Ambulatory Visit (HOSPITAL_COMMUNITY): Payer: Medicare PPO

## 2022-05-25 DIAGNOSIS — Z87442 Personal history of urinary calculi: Secondary | ICD-10-CM | POA: Diagnosis not present

## 2022-05-25 DIAGNOSIS — M8580 Other specified disorders of bone density and structure, unspecified site: Secondary | ICD-10-CM | POA: Insufficient documentation

## 2022-05-31 ENCOUNTER — Other Ambulatory Visit: Payer: Self-pay | Admitting: Surgery

## 2022-05-31 ENCOUNTER — Ambulatory Visit
Admission: RE | Admit: 2022-05-31 | Discharge: 2022-05-31 | Disposition: A | Discharge status: Home / Self Care | Payer: Medicare PPO | Source: Ambulatory Visit | Attending: Surgery | Admitting: Surgery

## 2022-05-31 DIAGNOSIS — E042 Nontoxic multinodular goiter: Secondary | ICD-10-CM | POA: Diagnosis not present

## 2022-06-01 NOTE — Progress Notes (Signed)
USN was negative for any sign of parathyroid adenoma or thyroid disease.  However, 24 hour urine calcium was markedly elevated indicative of parathyroid disease.  Claiborne Billings - please schedule patient for nuclear med parathyroid sestamibi scan.  tmg  Armandina Gemma, MD [facility] [place] practice Office: [phone]

## 2022-06-07 DIAGNOSIS — N816 Rectocele: Secondary | ICD-10-CM | POA: Diagnosis not present

## 2022-06-08 ENCOUNTER — Other Ambulatory Visit: Payer: Self-pay | Admitting: Surgery

## 2022-06-08 DIAGNOSIS — E213 Hyperparathyroidism, unspecified: Secondary | ICD-10-CM

## 2022-06-17 ENCOUNTER — Other Ambulatory Visit: Payer: Self-pay | Admitting: Family Medicine

## 2022-06-17 DIAGNOSIS — Z96611 Presence of right artificial shoulder joint: Secondary | ICD-10-CM | POA: Diagnosis not present

## 2022-06-18 ENCOUNTER — Encounter
Admission: RE | Admit: 2022-06-18 | Discharge: 2022-06-18 | Disposition: A | Discharge status: Home / Self Care | Payer: Medicare PPO | Source: Ambulatory Visit | Attending: Surgery | Admitting: Surgery

## 2022-06-18 DIAGNOSIS — E213 Hyperparathyroidism, unspecified: Secondary | ICD-10-CM | POA: Diagnosis not present

## 2022-06-18 MED ORDER — TECHNETIUM TC 99M SESTAMIBI - CARDIOLITE
25.0000 | Freq: Once | INTRAVENOUS | Status: AC
  Administered 2022-06-18: 25.2 via INTRAVENOUS

## 2022-06-20 NOTE — Progress Notes (Signed)
The USN and the nuclear med sestamibi scan are both negative for parathyroid adenoma.  Claiborne Billings - please contact patient and order a 4D-CT scan of the neck with parathyroid protocol.  [place], MD [place] practice Office: [phone]

## 2022-07-01 ENCOUNTER — Other Ambulatory Visit: Payer: Self-pay | Admitting: Cardiology

## 2022-07-06 ENCOUNTER — Other Ambulatory Visit: Payer: Self-pay | Admitting: Surgery

## 2022-07-06 DIAGNOSIS — E213 Hyperparathyroidism, unspecified: Secondary | ICD-10-CM

## 2022-07-07 ENCOUNTER — Other Ambulatory Visit: Payer: Self-pay | Admitting: Family Medicine

## 2022-07-07 DIAGNOSIS — N819 Female genital prolapse, unspecified: Secondary | ICD-10-CM | POA: Diagnosis not present

## 2022-07-09 ENCOUNTER — Ambulatory Visit
Admission: RE | Admit: 2022-07-09 | Discharge: 2022-07-09 | Disposition: A | Discharge status: Home / Self Care | Payer: Medicare PPO | Source: Ambulatory Visit | Attending: Surgery | Admitting: Surgery

## 2022-07-09 DIAGNOSIS — E041 Nontoxic single thyroid nodule: Secondary | ICD-10-CM | POA: Diagnosis not present

## 2022-07-09 DIAGNOSIS — E213 Hyperparathyroidism, unspecified: Secondary | ICD-10-CM

## 2022-07-09 DIAGNOSIS — M47812 Spondylosis without myelopathy or radiculopathy, cervical region: Secondary | ICD-10-CM | POA: Diagnosis not present

## 2022-07-09 DIAGNOSIS — I6523 Occlusion and stenosis of bilateral carotid arteries: Secondary | ICD-10-CM | POA: Diagnosis not present

## 2022-07-09 MED ORDER — IOPAMIDOL (ISOVUE-300) INJECTION 61%
75.0000 mL | Freq: Once | INTRAVENOUS | Status: AC | PRN
  Administered 2022-07-09: 75 mL via INTRAVENOUS

## 2022-07-22 ENCOUNTER — Ambulatory Visit: Payer: Self-pay | Admitting: Surgery

## 2022-07-22 ENCOUNTER — Telehealth: Payer: Self-pay | Admitting: Surgery

## 2022-07-22 NOTE — Telephone Encounter (Signed)
Greatly appreciate help from Dr. Harlow Asa and [facility] [place] staff.

## 2022-07-22 NOTE — Telephone Encounter (Signed)
Telephone call to patient with results of USN, sestamibi, and CT scan.  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  Plan right superior minimally invasive parathyroidectomy as an outpatient procedure.  Will enter orders and forward to schedulers to contact patient.  tmg  Armandina Gemma, [facility] [place] practice Office: [phone]

## 2022-07-26 DIAGNOSIS — N819 Female genital prolapse, unspecified: Secondary | ICD-10-CM | POA: Diagnosis not present

## 2022-08-03 ENCOUNTER — Ambulatory Visit: Payer: Medicare PPO | Admitting: Family Medicine

## 2022-08-03 ENCOUNTER — Encounter: Payer: Self-pay | Admitting: Family Medicine

## 2022-08-03 DIAGNOSIS — E119 Type 2 diabetes mellitus without complications: Secondary | ICD-10-CM

## 2022-08-03 MED ORDER — LEVEMIR FLEXTOUCH 100 UNIT/ML ~~LOC~~ SOPN: 41.0000 [IU] | PEN_INJECTOR | Freq: Every evening | SUBCUTANEOUS | Status: DC

## 2022-08-03 MED ORDER — METFORMIN HCL 1000 MG PO TABS: ORAL_TABLET | ORAL | Status: DC

## 2022-08-03 NOTE — Patient Instructions (Addendum)
If you don't hear from Dr. Harlow Asa soon, then let me know.   I'll check with him in the meantime.  Take care.  Glad to see you. Go to the lab on the way out.   If you have mychart we'll likely use that to update you.

## 2022-08-03 NOTE — Progress Notes (Unsigned)
R shoulder ROM is clearly better.    She had PT for abd wall.  She is back to using pessary with relief.    She had seen Dr. Harlow Asa re: PTH.  10 x 6 x 7 mm nodule posterior to the right lobe of the thyroid is consistent with a right superior parathyroid adenoma.  She talked with him about surgery.  She is off HCTZ.  She has mild BLE edema now.  She can tolerate the BLE edema as is.    She was asking about the timeline for surgery, waiting until after Christmas.  I need to ask Dr. Harlow Asa about that.  She would like to get her echo done first on 08/26/22.    Diabetes:  Using medications without difficulties: yes Hypoglycemic episodes:no Hyperglycemic episodes:no Feet problems:no Blood Sugars averaging: usually ~120s Labs pending.    Meds, vitals, and allergies reviewed.   ROS: Per HPI unless specifically indicated in ROS section   GEN: nad, alert and oriented HEENT: ncat NECK: supple w/o LA CV: rrr, SEM noted.   PULM: ctab, no inc wob ABD: soft, +bs EXT: no edema SKIN: no acute rash  Diabetic foot exam: Normal inspection No skin breakdown No calluses  Normal DP pulses Normal sensation to light touch and monofilament Nails normal  30 minutes were devoted to patient care in this encounter (this includes time spent reviewing the patient's file/history, interviewing and examining the patient, counseling/reviewing plan with patient).

## 2022-08-04 LAB — COMPREHENSIVE METABOLIC PANEL
ALT: 16 U/L (ref 0–35)
AST: 16 U/L (ref 0–37)
Albumin: 4 g/dL (ref 3.5–5.2)
Alkaline Phosphatase: 98 U/L (ref 39–117)
BUN: 16 mg/dL (ref 6–23)
CO2: 23 mEq/L (ref 19–32)
Calcium: 10.2 mg/dL (ref 8.4–10.5)
Chloride: 104 mEq/L (ref 96–112)
Creatinine, Ser: 0.5 mg/dL (ref 0.40–1.20)
GFR: 90.81 mL/min (ref >60.00)
Glucose, Bld: 99 mg/dL (ref 70–99)
Potassium: 4.2 mEq/L (ref 3.5–5.1)
Sodium: 139 mEq/L (ref 135–145)
Total Bilirubin: 1.8 mg/dL — ABNORMAL HIGH (ref 0.2–1.2)
Total Protein: 6.2 g/dL (ref 6.0–8.3)

## 2022-08-04 LAB — VITAMIN D 25 HYDROXY (VIT D DEFICIENCY, FRACTURES): VITD: 43.58 ng/mL (ref 30.00–100.00)

## 2022-08-04 LAB — PTH, INTACT AND CALCIUM
Calcium: 10.2 mg/dL (ref 8.6–10.4)
PTH: 48 pg/mL (ref 16–77)

## 2022-08-04 LAB — HEMOGLOBIN A1C: Hgb A1c MFr Bld: 7.6 % — ABNORMAL HIGH (ref 4.6–6.5)

## 2022-08-04 NOTE — Assessment & Plan Note (Signed)
See notes on labs. 

## 2022-08-04 NOTE — Assessment & Plan Note (Signed)
See notes on follow-up labs. She was asking about the timeline for surgery, waiting until after Christmas.  I need to ask Dr. Harlow Asa about that.  She would like to get her echo done first on 08/26/22.  It does not appear that she has any urgent issues that would drive surgery right now, unless her labs are significantly different than prior.

## 2022-08-13 DIAGNOSIS — N819 Female genital prolapse, unspecified: Secondary | ICD-10-CM | POA: Diagnosis not present

## 2022-08-20 ENCOUNTER — Encounter (HOSPITAL_BASED_OUTPATIENT_CLINIC_OR_DEPARTMENT_OTHER): Payer: Self-pay | Admitting: Cardiology

## 2022-08-26 ENCOUNTER — Ambulatory Visit (HOSPITAL_COMMUNITY): Payer: Medicare PPO | Attending: Cardiology

## 2022-08-26 DIAGNOSIS — I350 Nonrheumatic aortic (valve) stenosis: Secondary | ICD-10-CM | POA: Diagnosis not present

## 2022-08-26 LAB — ECHOCARDIOGRAM COMPLETE
AR max vel: 1.07 cm2
AV Area VTI: 1.1 cm2
AV Area mean vel: 1.11 cm2
AV Mean grad: 30 mmHg
AV Peak grad: 52.7 mmHg
Ao pk vel: 3.63 m/s
Area-P 1/2: 2.76 cm2
S' Lateral: 2.9 cm

## 2022-08-27 DIAGNOSIS — N819 Female genital prolapse, unspecified: Secondary | ICD-10-CM | POA: Diagnosis not present

## 2022-09-06 ENCOUNTER — Other Ambulatory Visit (HOSPITAL_COMMUNITY): Payer: Medicare PPO

## 2022-09-11 ENCOUNTER — Encounter: Payer: Self-pay | Admitting: Family Medicine

## 2022-09-13 ENCOUNTER — Other Ambulatory Visit: Payer: Self-pay | Admitting: Family Medicine

## 2022-09-13 ENCOUNTER — Telehealth: Payer: Self-pay

## 2022-09-13 NOTE — Telephone Encounter (Addendum)
I started a prior auth for Levemir FlexPen 100UNIT/ML pen-injectors. TYLEA HISE Key: [id number] Per Cover My Meds, medication is Available without authorization.  I called [employer] pharmacy help desk at [phone], spoke to [place].  She stated this medication does not need a PA.  They show that patient should pay approximately $35 for this prescription.   Notified patient via mychart.

## 2022-09-13 NOTE — Progress Notes (Addendum)
Anesthesia Review:  PCP: Elsie Stain LOV 08/03/22  Cardiologist : LVO 03/03/22 B. Bhagat  Chest x-ray : EKG : Echo : 08/26/22  Stress test: Cardiac Cath : 2021  Arthrectomy - 2021   Activity level:  Sleep Study/ CPAP : Fasting Blood Sugar :      / Checks Blood Sugar -- times a day:   Blood Thinner/ Instructions /Last Dose: ASA / Instructions/ Last Dose :  Plavix  81 mg aspiirin  DM- type  Hgba1c-  08/03/22- 7.6

## 2022-09-13 NOTE — Patient Instructions (Addendum)
SURGICAL WAITING ROOM VISITATION Patients having surgery or a procedure may have no more than 2 support people in the waiting area - these visitors may rotate.   Children under the age of 33 must have an adult with them who is not the patient. If the patient needs to stay at the hospital during part of their recovery, the visitor guidelines for inpatient rooms apply. Pre-op nurse will coordinate an appropriate time for 1 support person to accompany patient in pre-op.  This support person may not rotate.    Please refer to the [facility] website for the visitor guidelines for Inpatients (after your surgery is over and you are in a regular room).       Your procedure is scheduled on:  09/23/2022    Report to [facility] Main Entrance    Report to admitting at   [phone]   Call this number if you have problems the morning of surgery [phone]   Do not eat food :After Midnight.   After Midnight you may have the following liquids until  0530 AM DAY OF SURGERY  Water Non-Citrus Juices (without pulp, NO RED) Carbonated Beverages Black Coffee (NO MILK/CREAM OR CREAMERS, sugar ok)  Clear Tea (NO MILK/CREAM OR CREAMERS, sugar ok) regular and decaf                             Plain Jell-O (NO RED)                                           Fruit ices (not with fruit pulp, NO RED)                                     Popsicles (NO RED)                                                               Sports drinks like Gatorade (NO RED)       The day of surgery:  Drink ONE (1) Pre-Surgery Clear Ensure or G2 at    0530 am ( have completed by )  the morning of surgery. Drink in one sitting. Do not sip.  This drink was given to you during your hospital  pre-op appointment visit. Nothing else to drink after completing the  Pre-Surgery Clear Ensure or G2.          If you have questions, please contact your surgeon's office.       Oral Hygiene is also important to reduce your risk of  infection.                                    Remember - BRUSH YOUR TEETH THE MORNING OF SURGERY WITH YOUR REGULAR TOOTHPASTE   Do NOT smoke after Midnight   Take these medicines the morning of surgery with A SIP OF WATER:   none           Tale 1/2 of pm dose  of Levemir nite before surgery.    DO NOT TAKE ANY ORAL DIABETIC MEDICATIONS DAY OF YOUR SURGERY  Bring CPAP mask and tubing day of surgery.                              You may not have any metal on your body including hair pins, jewelry, and body piercing             Do not wear make-up, lotions, powders, perfumes/cologne, or deodorant  Do not wear nail polish including gel and S&S, artificial/acrylic nails, or any other type of covering on natural nails including finger and toenails. If you have artificial nails, gel coating, etc. that needs to be removed by a nail salon please have this removed prior to surgery or surgery may need to be canceled/ delayed if the surgeon/ anesthesia feels like they are unable to be safely monitored.   Do not shave  48 hours prior to surgery.               Men may shave face and neck.   Do not bring valuables to the hospital. [place].   Contacts, dentures or bridgework may not be worn into surgery.   Bring small overnight bag day of surgery.   DO NOT [place]. PHARMACY WILL DISPENSE MEDICATIONS LISTED ON YOUR MEDICATION LIST TO YOU DURING YOUR ADMISSION [place]!    Patients discharged on the day of surgery will not be allowed to drive home.  Someone NEEDS to stay with you for the first 24 hours after anesthesia.   Special Instructions: Bring a copy of your healthcare power of attorney and living will documents the day of surgery if you haven't scanned them before.              Please read over the following fact sheets you were given: IF [place]  [phone]   If you received a COVID test during your pre-op visit  it is requested that you wear a mask when out in public, stay away from anyone that may not be feeling well and notify your surgeon if you develop symptoms. If you test positive for Covid or have been in contact with anyone that has tested positive in the last 10 days please notify you surgeon.    [facility] - Preparing for Surgery Before surgery, you can play an important role.  Because skin is not sterile, your skin needs to be as free of germs as possible.  You can reduce the number of germs on your skin by washing with CHG (chlorahexidine gluconate) soap before surgery.  CHG is an antiseptic cleaner which kills germs and bonds with the skin to continue killing germs even after washing. Please DO NOT use if you have an allergy to CHG or antibacterial soaps.  If your skin becomes reddened/irritated stop using the CHG and inform your nurse when you arrive at Short Stay. Do not shave (including legs and underarms) for at least 48 hours prior to the first CHG shower.  You may shave your face/neck. Please follow these instructions carefully:  1.  Shower with CHG Soap the night before surgery and the  morning of Surgery.  2.  If you choose to wash your  hair, wash your hair first as usual with your  normal  shampoo.  3.  After you shampoo, rinse your hair and body thoroughly to remove the  shampoo.                           4.  Use CHG as you would any other liquid soap.  You can apply chg directly  to the skin and wash                       Gently with a scrungie or clean washcloth.  5.  Apply the CHG Soap to your body ONLY FROM THE NECK DOWN.   Do not use on face/ open                           Wound or open sores. Avoid contact with eyes, ears mouth and genitals (private parts).                       Wash face,  Genitals (private parts) with your normal soap.             6.  Wash thoroughly, paying special attention to the area  where your surgery  will be performed.  7.  Thoroughly rinse your body with warm water from the neck down.  8.  DO NOT shower/wash with your normal soap after using and rinsing off  the CHG Soap.                9.  Pat yourself dry with a clean towel.            10.  Wear clean pajamas.            11.  Place clean sheets on your bed the night of your first shower and do not  sleep with pets. Day of Surgery : Do not apply any lotions/deodorants the morning of surgery.  Please wear clean clothes to the hospital/surgery center.  FAILURE TO FOLLOW THESE INSTRUCTIONS MAY RESULT IN THE CANCELLATION OF YOUR SURGERY PATIENT SIGNATURE_________________________________  NURSE SIGNATURE__________________________________  ________________________________________________________________________[facility] - Preparing for Surgery Before surgery, you can play an important role.  Because skin is not sterile, your skin needs to be as free of germs as possible.  You can reduce the number of germs on your skin by washing with CHG (chlorahexidine gluconate) soap before surgery.  CHG is an antiseptic cleaner which kills germs and bonds with the skin to continue killing germs even after washing. Please DO NOT use if you have an allergy to CHG or antibacterial soaps.  If your skin becomes reddened/irritated stop using the CHG and inform your nurse when you arrive at Short Stay. Do not shave (including legs and underarms) for at least 48 hours prior to the first CHG shower.  You may shave your face/neck. Please follow these instructions carefully:  1.  Shower with CHG Soap the night before surgery and the  morning of Surgery.  2.  If you choose to wash your hair, wash your hair first as usual with your  normal  shampoo.  3.  After you shampoo, rinse your hair and body thoroughly to remove the  shampoo.                           4.  Use  CHG as you would any other liquid soap.  You can apply chg directly  to the skin and  wash                       Gently with a scrungie or clean washcloth.  5.  Apply the CHG Soap to your body ONLY FROM THE NECK DOWN.   Do not use on face/ open                           Wound or open sores. Avoid contact with eyes, ears mouth and genitals (private parts).                       Wash face,  Genitals (private parts) with your normal soap.             6.  Wash thoroughly, paying special attention to the area where your surgery  will be performed.  7.  Thoroughly rinse your body with warm water from the neck down.  8.  DO NOT shower/wash with your normal soap after using and rinsing off  the CHG Soap.                9.  Pat yourself dry with a clean towel.            10.  Wear clean pajamas.            11.  Place clean sheets on your bed the night of your first shower and do not  sleep with pets. Day of Surgery : Do not apply any lotions/deodorants the morning of surgery.  Please wear clean clothes to the hospital/surgery center.  FAILURE TO FOLLOW THESE INSTRUCTIONS MAY RESULT IN THE CANCELLATION OF YOUR SURGERY PATIENT SIGNATURE_________________________________  NURSE SIGNATURE__________________________________  ________________________________________________________________________

## 2022-09-14 ENCOUNTER — Encounter (HOSPITAL_COMMUNITY): Payer: Self-pay

## 2022-09-14 ENCOUNTER — Encounter (HOSPITAL_COMMUNITY)
Admission: RE | Admit: 2022-09-14 | Discharge: 2022-09-14 | Disposition: A | Discharge status: Home / Self Care | Payer: Medicare PPO | Source: Ambulatory Visit | Attending: Surgery | Admitting: Surgery

## 2022-09-14 ENCOUNTER — Telehealth: Payer: Self-pay

## 2022-09-14 ENCOUNTER — Other Ambulatory Visit: Payer: Self-pay

## 2022-09-14 VITALS — BP 127/69 | HR 69 | Temp 98.1°F | Resp 16 | Ht 63.0 in | Wt 144.0 lb

## 2022-09-14 DIAGNOSIS — G4730 Sleep apnea, unspecified: Secondary | ICD-10-CM | POA: Insufficient documentation

## 2022-09-14 DIAGNOSIS — Z7984 Long term (current) use of oral hypoglycemic drugs: Secondary | ICD-10-CM | POA: Diagnosis not present

## 2022-09-14 DIAGNOSIS — I10 Essential (primary) hypertension: Secondary | ICD-10-CM | POA: Insufficient documentation

## 2022-09-14 DIAGNOSIS — Z79899 Other long term (current) drug therapy: Secondary | ICD-10-CM | POA: Diagnosis not present

## 2022-09-14 DIAGNOSIS — E210 Primary hyperparathyroidism: Secondary | ICD-10-CM | POA: Diagnosis not present

## 2022-09-14 DIAGNOSIS — I350 Nonrheumatic aortic (valve) stenosis: Secondary | ICD-10-CM | POA: Insufficient documentation

## 2022-09-14 DIAGNOSIS — Z01818 Encounter for other preprocedural examination: Secondary | ICD-10-CM | POA: Diagnosis not present

## 2022-09-14 DIAGNOSIS — E119 Type 2 diabetes mellitus without complications: Secondary | ICD-10-CM | POA: Insufficient documentation

## 2022-09-14 HISTORY — DX: Cardiac murmur, unspecified: R01.1

## 2022-09-14 HISTORY — DX: Personal history of urinary calculi: Z87.442

## 2022-09-14 LAB — CBC
HCT: 42.3 % (ref 36.0–46.0)
Hemoglobin: 13.9 g/dL (ref 12.0–15.0)
MCH: 30.7 pg (ref 26.0–34.0)
MCHC: 32.9 g/dL (ref 30.0–36.0)
MCV: 93.4 fL (ref 80.0–100.0)
Platelets: 190 10*3/uL (ref 150–400)
RBC: 4.53 MIL/uL (ref 3.87–5.11)
RDW: 13.4 % (ref 11.5–15.5)
WBC: 4.8 10*3/uL (ref 4.0–10.5)
nRBC: 0 % (ref 0.0–0.2)

## 2022-09-14 LAB — BASIC METABOLIC PANEL
Anion gap: 12 (ref 5–15)
BUN: 19 mg/dL (ref 8–23)
CO2: 24 mmol/L (ref 22–32)
Calcium: 10.3 mg/dL (ref 8.9–10.3)
Chloride: 107 mmol/L (ref 98–111)
Creatinine, Ser: 0.54 mg/dL (ref 0.44–1.00)
GFR, Estimated: >60 mL/min (ref >60)
Glucose, Bld: 111 mg/dL — ABNORMAL HIGH (ref 70–99)
Potassium: 4.3 mmol/L (ref 3.5–5.1)
Sodium: 143 mmol/L (ref 135–145)

## 2022-09-14 LAB — GLUCOSE, CAPILLARY: Glucose-Capillary: 98 mg/dL (ref 70–99)

## 2022-09-14 NOTE — Telephone Encounter (Signed)
   Name: AVEA MCGOWEN  DOB: Mar 17, 1946  MRN: [id number]  Primary Cardiologist: Fransico Him, MD  Chart reviewed as part of pre-operative protocol coverage. Because of Jayah Balthazar Mericle's past medical history and time since last visit, she will require a follow-up in-office visit in order to better assess preoperative cardiovascular risk.  Pre-op covering staff: - Please schedule appointment and call patient to inform them. If patient already had an upcoming appointment within acceptable timeframe, please add "pre-op clearance" to the appointment notes so provider is aware. - Please contact requesting surgeon's office via preferred method (i.e, phone, fax) to inform them of need for appointment prior to surgery.  Last PCI 2021 to the [facility]. Per office protocol should be okay to hold asa x 7 days and plavix x 5 days as long as patient is asymptomatic at the time of her appointment.    Elgie Collard, PA-C  09/14/2022, 2:49 PM

## 2022-09-14 NOTE — Telephone Encounter (Signed)
Pt has been scheduled to see Melina Copa, [facility] 09/21/22 @ 1:55. Pt states she is going to be out of town until 09/20/22. I did inform the pt to begin holding ASA as of today, she will take Plavix tomorrow 09/15/22 and then hold until surgery has been done. Pt thanked me for the wonderful help today to make sure her surgery is not post poned.

## 2022-09-14 NOTE — Telephone Encounter (Signed)
..     Pre-operative Risk Assessment    Patient Name: Gloria [name]  DOB: 18-Sep-1946 MRN: [id number]      Request for Surgical Clearance    Procedure:   parathyroid  Date of Surgery:  Clearance 09/23/22                                 Surgeon:  DR TODD Harlow Asa Surgeon's Group or Practice Name:  [facility] Phone number:  [phone] Fax number:  [phone]   Type of Clearance Requested:   - Medical  - Pharmacy:  Hold Aspirin and Clopidogrel (Plavix)     Type of Anesthesia:  General    Additional requests/questions:    Gwenlyn Found   09/14/2022, 2:34 PM

## 2022-09-15 NOTE — Progress Notes (Addendum)
Anesthesia Chart Review   Case: [id number] Date/Time: 09/23/22 0815   Procedure: RIGHT SUPERIOR PARATHYROIDECTOMY (Right)   Anesthesia type: General   Pre-op diagnosis: PRIMARY HYPERPARATHYROIDISM   Location: [facility] ROOM 04 / [facility]   Surgeons: Armandina Gemma, MD       DISCUSSION:76 y.o. never smoker with h/o PONV, HTN, DM II, moderate AS, CAD (DES), sleep apnea, primary hyperparathyroidism scheduled for above procedure 09/23/2022 with Dr. Armandina Gemma.   Pt seen by cardiology 09/21/2022. Per OV note, "Preop evaluation for parathyroidectomy - RCRI is 6.6. indicating moderate CV risk based on history. She is able to complete well over 4 METS without any angina or dyspnea. The patient affirms she has been doing well without any new cardiac symptoms. Therefore, based on ACC/AHA guidelines, the patient would be at acceptable risk for the planned procedure without further cardiovascular testing. The patient was advised that if she develops new symptoms prior to surgery to contact our office to arrange for a follow-up visit, and she verbalized understanding. Dr. Radford Pax had previously given permission for the patient to hold dual antiplatelet therapy in preparation for an unrelated surgery therefore the same concept applies here and the patient had already begun holding her medication as outlined above. We typically advise that blood thinners be resumed when felt safe by performing physician. Will route copy of this note to requesting surgeon."  Pt advised by cardiology 09/14/2022 to hold plavix for 5 days.   Anticipate pt can proceed with planned procedure barring acute status change.   VS: BP 127/69   Pulse 69   Temp 36.7 C (Oral)   Resp 16   Ht _0  (1.6 m)   Wt 65.3 kg   LMP 08/08/2009   SpO2 98%   BMI 25.51 kg/m   PROVIDERS: Tonia Ghent, MD is PCP   Cardiologist:  Fransico Him, MD   LABS: Labs reviewed: Acceptable for surgery. (all labs ordered are listed, but only abnormal results are  displayed)  Labs Reviewed  BASIC METABOLIC PANEL - Abnormal; Notable for the following components:      Result Value   Glucose, Bld 111 (*)    All other components within normal limits  CBC  GLUCOSE, CAPILLARY     IMAGES:   EKG:   CV: Echo 08/26/2022 1. Left ventricular ejection fraction, by estimation, is 55 to 60%. The  left ventricle has normal function. The left ventricle has no regional  wall motion abnormalities. There is mild concentric left ventricular  hypertrophy. Left ventricular diastolic  parameters are consistent with Grade I diastolic dysfunction (impaired  relaxation).   2. Right ventricular systolic function is normal. The right ventricular  size is normal. There is normal pulmonary artery systolic pressure.   3. Left atrial size was moderately dilated.   4. The mitral valve is normal in structure. No evidence of mitral valve  regurgitation. No evidence of mitral stenosis.   5. Tricuspid valve regurgitation is mild to moderate.   6. The aortic valve is normal in structure. Aortic valve regurgitation is  trivial. Moderate aortic valve stenosis. Aortic valve area, by VTI  measures 1.10 cm. Aortic valve mean gradient measures 30.0 mmHg. Aortic  valve Vmax measures 3.63 m/s.   7. The inferior vena cava is normal in size with greater than 50%  respiratory variability, suggesting right atrial pressure of 3 mmHg.   Past Medical History:  Diagnosis Date   ABDOMINAL PAIN OTHER SPECIFIED SITE 08/27/2010   Qualifier: Diagnosis of  By: Damita Dunnings MD, [place] directive discussed with patient 01/03/2012   12/2011-AD d/w pt.  She has talked to husband but doesn't have living will yet.  Full code in discussion with patient today.  Would not want prolonged interventions if she were profoundly and/or permanently impaired, ie severe dementia or a condition with no hope of improvement.   01/13/15-Advance directive d/w pt- husband designated if patient were incapacitated.      Allergic rhinitis    Aortic stenosis    Moderate by echo 02/2022 with mean aortic valve gradient 25 mmHg   Arthritis    Asthma    as a chld   Complication of anesthesia    Coronary artery disease    Diabetes mellitus without complication ([place]) 70/11/7492   Qualifier: Diagnosis of  By: Council Mechanic MD, Hilaria Ota    Disorder of bone and cartilage 12/20/2007   2016 DXA: -1.4.  Consider repeat in 2021.      Exertional shortness of breath 01/02/2020   Female cystocele 08/10/2012   Health care maintenance 02/09/2018   Heart murmur    Hemorrhoids    History of kidney stones    Hypertension    Left shoulder pain 07/20/2016   Medicare annual wellness visit, subsequent 01/03/2012   MENOPAUSE, SURGICAL 12/16/2009   Qualifier: Diagnosis of  By: Lurlean Nanny LPN, [place]     PONV (postoperative nausea and vomiting)    Pure hypercholesterolemia 03/30/2011   RHINITIS 12/24/2010   Qualifier: Diagnosis of  By: Damita Dunnings MD, Phillip Heal     Sleep apnea    wears mouthpiece   Snoring 01/02/2020    Past Surgical History:  Procedure Laterality Date   ABDOMINAL HYSTERECTOMY     BASAL CELL CARCINOMA EXCISION  08/23/2017   left upper arm   CARPAL TUNNEL RELEASE     with bilateral releases   CORONARY ATHERECTOMY N/A 02/26/2020   Procedure: CORONARY ATHERECTOMY;  Surgeon: [name], MD;  Location: [facility];  Service: Cardiovascular;  Laterality: N/A;   CYSTECTOMY     Left breast, right lumpectomy B9   INGUINAL HERNIA REPAIR     Right (Dr. Annamaria Boots)   [place] CATH AND CORONARY ANGIOGRAPHY N/A 02/22/2020   Procedure: LEFT HEART CATH AND CORONARY ANGIOGRAPHY;  Surgeon: [name], MD;  Location: [facility];  Service: Cardiovascular;  Laterality: N/A;   NSVD     X 2   REVERSE SHOULDER ARTHROPLASTY Right 12/18/2021   Procedure: REVERSE SHOULDER ARTHROPLASTY;  Surgeon: Nicholes Stairs, MD;  Location: [facility];  Service: Orthopedics;  Laterality: Right;  150   TVH with vaginal  prolapse  08/29/09   Transvag Tape w/Varitensor, Ant & Post Colporraphy, Uterosacral Lig susp, McCall Culdoplasty   ueteroscopy  11/01   without laser secondary stone   Urological procedure  10/22 - 08/31/09   [facility]    MEDICATIONS:  ACCU-CHEK AVIVA PLUS test strip   ACCU-CHEK FASTCLIX LANCETS MISC   aspirin EC 81 MG tablet   atorvastatin (LIPITOR) 20 MG tablet   B-D ULTRAFINE III SHORT PEN 31G X 8 MM MISC   Blood Glucose Monitoring Suppl (ACCU-CHEK AVIVA PLUS) W/DEVICE KIT   Cholecalciferol (VITAMIN D) 50 MCG (2000 UT) CAPS   clopidogrel (PLAVIX) 75 MG tablet   conjugated estrogens (PREMARIN) vaginal cream   insulin detemir (LEVEMIR FLEXTOUCH) 100 UNIT/ML FlexPen   lisinopril (ZESTRIL) 20 MG tablet   metFORMIN (GLUCOPHAGE) 1000 MG tablet   nitroGLYCERIN (NITROSTAT) 0.4 MG SL tablet  Omega-3 Fatty Acids (FISH OIL PO)   Psyllium (METAMUCIL PO)   vitamin C (ASCORBIC ACID) 500 MG tablet   No current facility-administered medications for this encounter.    Konrad Felix Ward, PA-C [facility] [phone]

## 2022-09-20 ENCOUNTER — Encounter: Payer: Self-pay | Admitting: Physician Assistant

## 2022-09-20 NOTE — Progress Notes (Unsigned)
Cardiology Office Note    Date:  09/21/2022   ID:  Gloria [name], DOB 05/23/1946, MRN [id number]  PCP:  Tonia Ghent, MD  Cardiologist:  Fransico Him, MD  Electrophysiologist:  None   Chief Complaint: pre-operative evaluation  History of Present Illness:   Gloria [name] is a 76 y.o. female with history of CAD s/p DES to RCA with residual disease treated medically, HTN, HLD, allergic rhinitis, moderate aortic stenosis, DM, sleep apnea who is seen for pre-operative evaluation, also overdue for routine follow-up. She established with Dr. Radford Pax in 2021 with abnormal coronary CTA at that time prompting cardiac cath 02/2020 with 50% prox to mid LAD, 40% mLCx, 80% OM3, 95% prox to mid RCA and underwent PCI of the RCA. She is also being followed for AS - last echo 08/2022 showed EF 55-60%, G1DD, normal RV, moderate LAE, mild-moderate TR, moderate AS.   She returns for pre-op evaluation prior to parathyroidectomy which is scheduled for 09/23/22. She reports she's done well from a cardiac standpoint without any chest pain or dyspnea. Nothing like what she felt before her prior PCI. [place] and was able to up and down the stairs 30 times over the course of several days without angina or dyspnea. She believes her last dose of ASA was 11/8 and Plavix was 11/8 or 11/9.   Labwork independently reviewed: 09/2022 CBC wnl, K 4.3, Cr 0.54 07/2022 A1C 7.6, T Bili 1.8, AST/ALT OK 02/2022 LDL 67, trig 90   Cardiology Studies:   Studies reviewed are outlined and summarized above. Reports included below if pertinent.   2D Echo 08/2022    1. Left ventricular ejection fraction, by estimation, is 55 to 60%. The  left ventricle has normal function. The left ventricle has no regional  wall motion abnormalities. There is mild concentric left ventricular  hypertrophy. Left ventricular diastolic  parameters are consistent with Grade I diastolic dysfunction (impaired   relaxation).   2. Right ventricular systolic function is normal. The right ventricular  size is normal. There is normal pulmonary artery systolic pressure.   3. Left atrial size was moderately dilated.   4. The mitral valve is normal in structure. No evidence of mitral valve  regurgitation. No evidence of mitral stenosis.   5. Tricuspid valve regurgitation is mild to moderate.   6. The aortic valve is normal in structure. Aortic valve regurgitation is  trivial. Moderate aortic valve stenosis. Aortic valve area, by VTI  measures 1.10 cm. Aortic valve mean gradient measures 30.0 mmHg. Aortic  valve Vmax measures 3.63 m/s.   7. The inferior vena cava is normal in size with greater than 50%  respiratory variability, suggesting right atrial pressure of 3 mmHg.   Comparison(s): No significant change from prior study. 03/01/22 EF 65-70%.  PA pressure 72mHg. Moderate AS 256mg mean PG, 4566m peak PG.   Cath 02/2020  1.  Severe single-vessel coronary artery disease with severe calcific stenosis of a large, dominant RCA 2.  Heavily calcified coronary arteries with multivessel calcification but nonobstructive disease in the left main, LAD, and left circumflex with the exception of severe branch vessel disease in the distal circumflex appropriate for medical therapy 3.  Normal LV function with normal LVEDP, LVEF estimated at 55 to 65%   Recommendations: The RCA is amenable to PCI.  The lesion is complex because of severe calcification and involvement of a large acute marginal branch.  Recommend start clopidogrel 300 mg  x 1, then 75 mg daily.  Plan for atherectomy and stenting next week.  Plan reviewed with the patient and her husband.  Prox LAD to Mid LAD lesion is 50% stenosed. Mid Cx lesion is 40% stenosed. 3rd Mrg lesion is 80% stenosed. Prox RCA to Mid RCA lesion is 95% stenosed. A drug-eluting stent was successfully placed using a STENT RESOLUTE ONYX 3.5X12. Post intervention, there is a 0%  residual stenosis.   Successful atherectomy and stenting of severe calcific stenosis of the mid right coronary artery, reducing the lesion from 95% to 0% with TIMI-3 flow both pre and post procedure.      Past Medical History:  Diagnosis Date   ABDOMINAL PAIN OTHER SPECIFIED SITE 08/27/2010   Qualifier: Diagnosis of  By: Damita Dunnings MD, Phillip Heal     Advance directive discussed with patient 01/03/2012   12/2011-AD d/w pt.  She has talked to husband but doesn't have living will yet.  Full code in discussion with patient today.  Would not want prolonged interventions if she were profoundly and/or permanently impaired, ie severe dementia or a condition with no hope of improvement.   01/13/15-Advance directive d/w pt- husband designated if patient were incapacitated.     Allergic rhinitis    Aortic stenosis    Moderate by echo 08/2022   Arthritis    Asthma    as a chld   Complication of anesthesia    Coronary artery disease    Diabetes mellitus without complication ([place]) 54/62/7035   Qualifier: Diagnosis of  By: Council Mechanic MD, Hilaria Ota    Disorder of bone and cartilage 12/20/2007   2016 DXA: -1.4.  Consider repeat in 2021.      Female cystocele 08/10/2012   Health care maintenance 02/09/2018   Heart murmur    Hemorrhoids    History of kidney stones    Hypertension    Left shoulder pain 07/20/2016   Medicare annual wellness visit, subsequent 01/03/2012   MENOPAUSE, SURGICAL 12/16/2009   Qualifier: Diagnosis of  By: Lurlean Nanny LPN, Reg[place]     PONV (postoperative nausea and vomiting)    Pure hypercholesterolemia 03/30/2011   RHINITIS 12/24/2010   Qualifier: Diagnosis of  By: Damita Dunnings MD, Phillip Heal     Sleep apnea    wears mouthpiece   Snoring 01/02/2020    Past Surgical History:  Procedure Laterality Date   ABDOMINAL HYSTERECTOMY     BASAL CELL CARCINOMA EXCISION  08/23/2017   left upper arm   CARPAL TUNNEL RELEASE     with bilateral releases   CORONARY ATHERECTOMY N/A 02/26/2020   Procedure:  CORONARY ATHERECTOMY;  Surgeon: [name], MD;  Location: [facility];  Service: Cardiovascular;  Laterality: N/A;   CYSTECTOMY     Left breast, right lumpectomy B9   INGUINAL HERNIA REPAIR     Right (Dr. Annamaria Boots)   [place] CATH AND CORONARY ANGIOGRAPHY N/A 02/22/2020   Procedure: LEFT HEART CATH AND CORONARY ANGIOGRAPHY;  Surgeon: [name], MD;  Location: [facility];  Service: Cardiovascular;  Laterality: N/A;   NSVD     X 2   REVERSE SHOULDER ARTHROPLASTY Right 12/18/2021   Procedure: REVERSE SHOULDER ARTHROPLASTY;  Surgeon: Nicholes Stairs, MD;  Location: [facility];  Service: Orthopedics;  Laterality: Right;  150   TVH with vaginal prolapse  08/29/09   Transvag Tape w/Varitensor, Ant & Post Colporraphy, Uterosacral Lig susp, McCall Culdoplasty   ueteroscopy  11/01   without laser secondary stone   Urological  procedure  10/22 - 08/31/09   [facility]    Current Medications: Current Meds  Medication Sig   ACCU-CHEK AVIVA PLUS test strip CHECK BLOOD SUGAR FOUR TIMES DAILY AS NEEDED   ACCU-CHEK FASTCLIX LANCETS MISC Use to check blood sugar 4 times daily as needed.  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   aspirin EC 81 MG tablet Take 1 tablet (81 mg total) by mouth daily. (Patient taking differently: Take 81 mg by mouth at bedtime.)   atorvastatin (LIPITOR) 20 MG tablet TAKE 1 TABLET BY MOUTH DAILY   B-D ULTRAFINE III SHORT PEN 31G X 8 MM MISC USE AS DIRECTED   Blood Glucose Monitoring Suppl (ACCU-CHEK AVIVA PLUS) W/DEVICE KIT Use to check blood sugar 4 times daily as needed.  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   clopidogrel (PLAVIX) 75 MG tablet TAKE 1 TABLET BY MOUTH DAILY.   conjugated estrogens (PREMARIN) vaginal cream Place 1 applicator vaginally every 14 (fourteen) days.   insulin detemir (LEVEMIR FLEXTOUCH) 100 UNIT/ML FlexPen Inject 41 Units into the skin every evening.    lisinopril (ZESTRIL) 20 MG tablet TAKE 1 TABLET BY MOUTH DAILY   metFORMIN (GLUCOPHAGE) 1000 MG tablet 1 tab in the AM and 1 tab at lunch   Omega-3 Fatty Acids (FISH OIL PO) Take 1 capsule by mouth daily.   Psyllium (METAMUCIL PO) Take 1 Scoop by mouth in the morning.   vitamin C (ASCORBIC ACID) 500 MG tablet Take 500 mg by mouth daily.      Allergies:   Sulfonamide derivatives, Ciprofloxin hcl [ciprofloxacin], and Penicillins   Social History   Socioeconomic History   Marital status: Married    Spouse name: Not on file   Number of children: 2   Years of education: Not on file   Highest education level: Not on file  Occupational History   Occupation: Retired Pharmacist, hospital (primary education) from the [employer]    Employer: RETIRED  Tobacco Use   Smoking status: Never   Smokeless tobacco: Never  Vaping Use   Vaping Use: Never used  Substance and Sexual Activity   Alcohol use: Yes    Alcohol/week: 2.0 - 3.0 standard drinks of alcohol    Types: 2 - 3 Glasses of wine per week    Comment: occas.   Drug use: No   Sexual activity: Not Currently  Other Topics Concern   Not on file  Social History Narrative   Retired Pharmacist, hospital   Marital Status: Married, 1968   Children: 2 children, 2 grandkids.    Social Determinants of Health   Financial Resource Strain: Low Risk  (12/17/2021)   Overall Financial Resource Strain (CARDIA)    Difficulty of Paying Living Expenses: Not hard at all  Food Insecurity: No Food Insecurity (12/17/2021)   Hunger Vital Sign    Worried About Running Out of Food in the Last Year: Never true    Ran Out of Food in the Last Year: Never true  Transportation Needs: No Transportation Needs (12/17/2021)   PRAPARE - Hydrologist (Medical): No    Lack of Transportation (Non-Medical): No  Physical Activity: Insufficiently Active (12/17/2021)   Exercise Vital Sign    Days of Exercise per Week: 7 days    Minutes of Exercise per  Session: 20 min  Stress: No Stress Concern Present (12/17/2021)   [place] -  Occupational Stress Questionnaire    Feeling of Stress : Not at all  Social Connections: Moderately Integrated (12/17/2021)   Social Connection and Isolation Panel [NHANES]    Frequency of Communication with Friends and Family: More than three times a week    Frequency of Social Gatherings with Friends and Family: More than three times a week    Attends Religious Services: More than 4 times per year    Active Member of [employer] or Organizations: No    Attends Music therapist: Never    Marital Status: Married     Family History:  The patient's family history includes Alcohol abuse in her cousin; Breast cancer in her cousin; Diabetes (age of onset: 51) in her paternal aunt; Heart disease in her mother, paternal uncle, and paternal uncle; Hypertension in her mother; Prostate cancer in her son; Stroke in her maternal aunt and paternal aunt; Thyroid disease in an other family member. There is no history of Colon cancer.  ROS:   Please see the history of present illness.  All other systems are reviewed and otherwise negative.    EKG(s)/Additional Labs   EKG:  EKG is not ordered today since reviewed recent EKG 09/14/22 - NSR 73bpm, nonspecific STTW chnages including ST upsloping V2 -> stable from prior  Recent Labs: 08/03/2022: ALT 16 09/14/2022: BUN 19; Creatinine, Ser 0.54; Hemoglobin 13.9; Platelets 190; Potassium 4.3; Sodium 143  Recent Lipid Panel    Component Value Date/Time   CHOL 120 02/08/2022 0957   CHOL 109 05/21/2021 0945   TRIG 90.0 02/08/2022 0957   HDL 35.20 (L) 02/08/2022 0957   HDL 36 (L) 05/21/2021 0945   CHOLHDL 3 02/08/2022 0957   VLDL 18.0 02/08/2022 0957   LDLCALC 67 02/08/2022 0957   LDLCALC 55 05/21/2021 0945    PHYSICAL EXAM:    VS:  BP 118/80   Pulse 80   Ht _0  (1.6 m)   Wt 150 lb (68 kg)   LMP 08/08/2009   SpO2 97%   BMI 26.57 kg/m    BMI: Body mass index is 26.57 kg/m.  GEN: Well nourished, well developed female in no acute distress HEENT: normocephalic, atraumatic Neck: no JVD, carotid bruits, or masses Cardiac: RRR; 2/6 SEM RUSB and LLSB described similarly at prior visit, no gallops, no edema  Respiratory:  clear to auscultation bilaterally, normal work of breathing GI: soft, nontender, nondistended, + BS MS: no deformity or atrophy Skin: warm and dry, no rash Neuro:  Alert and Oriented x 3, Strength and sensation are intact, follows commands Psych: euthymic mood, full affect  Wt Readings from Last 3 Encounters:  09/21/22 150 lb (68 kg)  09/14/22 144 lb (65.3 kg)  08/03/22 148 lb (67.1 kg)     ASSESSMENT & PLAN:   1. Preop evaluation for parathyroidectomy - RCRI is 6.6. indicating moderate CV risk based on history. She is able to complete well over 4 METS without any angina or dyspnea. The patient affirms she has been doing well without any new cardiac symptoms. Therefore, based on ACC/AHA guidelines, the patient would be at acceptable risk for the planned procedure without further cardiovascular testing. The patient was advised that if she develops new symptoms prior to surgery to contact our office to arrange for a follow-up visit, and she verbalized understanding. Dr. Radford Pax had previously given permission for the patient to hold dual antiplatelet therapy in preparation for an unrelated surgery therefore the same concept applies here and the patient had already  begun holding her medication as outlined above. We typically advise that blood thinners be resumed when felt safe by performing physician. Will route copy of this note to requesting surgeon.  2. CAD, HLD - doing well without recent angina. Lipids have been managed by PCP, on atorvastatin, last LDL 02/2022 at goal. I reviewed long term plan for her DAPT with Dr. Radford Pax. Given proximal nature of stent, she recommends long term rx with DAPT as tolerated (but OK  with interruption above).   3. Aortic stenosis - moderate by last echocardiogram. Had been periodically followed at 6 and 12 month intervals. Given stability, per review with Dr. Radford Pax, will plan repeat in 1 year (08/2023). Patient knows to reach out for any new symptoms. She also had mild-moderate TR by her echo that we will follow as well.  4. Essential HTN - BP controlled, no changes made today.   Disposition: F/u with Dr. Radford Pax or myself in 1 year after echocardiogram.   Medication Adjustments/Labs and Tests Ordered: Current medicines are reviewed at length with the patient today.  Concerns regarding medicines are outlined above. Medication changes, Labs and Tests ordered today are summarized above and listed in the Patient Instructions accessible in Encounters.   Signed, Charlie Pitter, PA-C  09/21/2022 2:13 PM    [facility] Phone: [phone]; Fax: [phone]

## 2022-09-21 ENCOUNTER — Encounter: Payer: Self-pay | Admitting: Physician Assistant

## 2022-09-21 ENCOUNTER — Ambulatory Visit: Payer: Medicare PPO | Attending: Physician Assistant | Admitting: Physician Assistant

## 2022-09-21 VITALS — BP 118/80 | HR 80 | Ht 63.0 in | Wt 150.0 lb

## 2022-09-21 DIAGNOSIS — I361 Nonrheumatic tricuspid (valve) insufficiency: Secondary | ICD-10-CM

## 2022-09-21 DIAGNOSIS — I2510 Atherosclerotic heart disease of native coronary artery without angina pectoris: Secondary | ICD-10-CM

## 2022-09-21 DIAGNOSIS — Z01810 Encounter for preprocedural cardiovascular examination: Secondary | ICD-10-CM | POA: Diagnosis not present

## 2022-09-21 DIAGNOSIS — I350 Nonrheumatic aortic (valve) stenosis: Secondary | ICD-10-CM

## 2022-09-21 DIAGNOSIS — E785 Hyperlipidemia, unspecified: Secondary | ICD-10-CM | POA: Diagnosis not present

## 2022-09-21 DIAGNOSIS — I10 Essential (primary) hypertension: Secondary | ICD-10-CM | POA: Diagnosis not present

## 2022-09-21 NOTE — Patient Instructions (Addendum)
Medication Instructions:   Your physician recommends that you continue on your current medications as directed. Please refer to the Current Medication list given to you today.   *If you need a refill on your cardiac medications before your next appointment, please call your pharmacy*   Lab Work: [place]    If you have labs (blood work) drawn today and your tests are completely normal, you will receive your results only by: [place] (if you have MyChart) OR A paper copy in the mail If you have any lab test that is abnormal or we need to change your treatment, we will call you to review the results.   Testing/Procedures:  ECHO BY 08-2023 Your physician has requested that you have an echocardiogram. Echocardiography is a painless test that uses sound waves to create images of your heart. It provides your doctor with information about the size and shape of your heart and how well your heart's chambers and valves are working. This procedure takes approximately one hour. There are no restrictions for this procedure. Please do NOT wear cologne, perfume, aftershave, or lotions (deodorant is allowed). Please arrive 15 minutes prior to your appointment time.   Follow-Up: At [facility], you and your health needs are our priority.  As part of our continuing mission to provide you with exceptional heart care, we have created designated Provider Care Teams.  These Care Teams include your primary Cardiologist (physician) and Advanced Practice Providers (APPs -  Physician Assistants and Nurse Practitioners) who all work together to provide you with the care you need, when you need it.  We recommend signing up for the patient portal called "MyChart".  Sign up information is provided on this After Visit Summary.  MyChart is used to connect with patients for Virtual Visits (Telemedicine).  Patients are able to view lab/test results, encounter notes, upcoming appointments, etc.   Non-urgent messages can be sent to your provider as well.   To learn more about what you can do with MyChart, go to [email or web address].    Your next appointment:    1 year(s)  The format for your next appointment:   In Person  Provider:    Fransico Him, MD  or Dunn PA-C  Other Instructions  Important Information About Sugar

## 2022-09-22 ENCOUNTER — Encounter (HOSPITAL_COMMUNITY): Payer: Self-pay | Admitting: Surgery

## 2022-09-22 DIAGNOSIS — E210 Primary hyperparathyroidism: Secondary | ICD-10-CM | POA: Diagnosis present

## 2022-09-22 NOTE — H&P (Signed)
REFERRING PHYSICIAN: Leonides Cave, MD  PROVIDER: Modena Bellemare Charlotta Newton, MD   Chief Complaint: New Consultation (Hypercalcemia, rule out primary hyperparathyroidism)  History of Present Illness:  Patient is referred by Dr. Leonides Cave for surgical evaluation and recommendations regarding hypercalcemia and possible primary hyperparathyroidism. Patient has a longstanding history of hypercalcemia. Post recently her level was 10.7. Patient had been taking hydrochlorothiazide for many years. This was discontinued in April 2023 without significant change in her serum calcium level. Additional laboratories included a intact PTH level which was appropriately suppressed at 17 and a normal 25-hydroxy vitamin D level of 47.46. Patient has not had any imaging studies. She has not had a 24-hour urine collection for calcium. Patient does have known osteopenia. She has had a recent right shoulder replacement surgery. She has a history of nephrolithiasis. She denies any significant fatigue. She has had no prior head or neck surgery. There is a family history of hyperparathyroidism in the patient's mother who was treated with radioactive iodine. 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  Review of Systems: A complete review of systems was obtained from the patient. I have reviewed this information and discussed as appropriate with the patient. See HPI as well for other ROS.  Review of Systems Constitutional: Negative. Negative for malaise/fatigue. HENT: Negative. Eyes: Negative. Respiratory: Negative. Cardiovascular: Negative. Gastrointestinal: Negative. Genitourinary: Nephrolithiasis Musculoskeletal: Osteopenia Skin: Negative. Neurological: Negative. Endo/Heme/Allergies: Negative. Psychiatric/Behavioral: Negative.  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 unaware Diabetes mellitus type II (CMS-HCC) 2005 Fibroid Hematuria 05/26/2010 Overview: Qualifier: Diagnosis of By: Damita Dunnings MD, Phillip Heal Hypertension 2000 OSA (obstructive sleep apnea) 02/26/2022 Osteoporosis PONV (postoperative nausea and vomiting) Pure hypercholesterolemia  Patient Active Problem List Diagnosis Type II or unspecified type diabetes mellitus without mention of complication, not stated as uncontrolled (CMS-HCC) Abdominal pain Other specified counseling Other chest pain Cardiac murmur Personal history of other diseases of urinary system Other abnormal findings in urine Type 2 diabetes mellitus without complications (CMS-HCC) Disruption of internal operation (surgical) wound, not elsewhere classified, initial encounter Essential (primary) hypertension Other disorders of bilirubin metabolism Hemorrhoids Other intra-abdominal and pelvic swelling, mass and lump Encounter for general adult medical examination without abnormal findings 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 Pneumonia Pure hypercholesterolemia Calculus of kidney Chronic rhinitis Hydronephrosis with renal and ureteral calculus obstruction Hypertension Vaginal vault prolapse after hysterectomy Cystocele, midline Atherosclerotic heart disease of native coronary artery without angina pectoris History of heart artery stent OSA (obstructive sleep apnea) Rectocele Hypercalcemia History of nephrolithiasis Osteopenia  Past Surgical History: Procedure Laterality Date HYSTERECTOMY VAGINAL 2010 BLADDER SURGERY 2010 COLONOSCOPY N/A 10/28/2015 Procedure: ( IDDM ) COLONOSCOPY, FLEXIBLE; DIAGNOSTIC, INCLUDING COLLECTION OF SPECIMEN(S) BY BRUSHING OR WASHING, WHEN PERFORMED (SEPARATE PROCEDURE); Surgeon: Lucrezia Europe Ang Maxie Better, MD; Location: [facility]; Service: General Surgery; Laterality: N/A; SIGMOIDOSCOPY FLEXIBLE N/A 06/22/2016 Procedure: IDDM *SIGMOIDOSCOPY FLEXIBLE; Surgeon: Lucrezia Europe Ang Maxie Better, MD; Location: [facility]; Service: General Surgery; Laterality: N/A; COLONOSCOPY W/BIOPSY N/A 10/19/2019 Procedure: Colonoscopy; Surgeon: Raynelle Jan., MD; Location: [place]; Service: Gastroenterology; Laterality: N/A; COLONOSCOPY W/REMOVAL LESIONS BY SNARE N/A 10/19/2019 Procedure: COLONOSCOPY, FLEXIBLE; WITH REMOVAL OF TUMOR(S), POLYP(S), OR OTHER LESION(S) BY SNARE TECHNIQUE; Surgeon: Raynelle Jan., MD; Location: [place]; Service: Gastroenterology; Laterality: N/A; shoulder surgery Right 12/18/2021 REPAIR VAGINAL PROLAPSE W/WO SACROSPINOUS LIGAMENT SUSPENSION Right 03/05/2022 Procedure: Sacrospinous ligament suspension; Surgeon: Delanna Notice, MD;  Location: [facility]; Service: Gynecology; Laterality: Right; COLPORRHAPHY FOR REPAIR CYSTOCELE ANTERIOR N/A 03/05/2022 Procedure: Anterior repair; Surgeon: Delanna Notice, MD; Location: [place]; Service: Gynecology; Laterality: N/A; CYSTOURETHROSCOPY N/A 03/05/2022 Procedure: Cystoscopy; Surgeon: Delanna Notice, MD; Location: [place]; Service: Gynecology; Laterality: N/A; bladder tact breast biopsies Bilateral benign BREAST BIOPSY 1968 BREAST SURGERY 1968 carpal tunnel surgery hernia repair HERNIA REPAIR TUBAL LIGATION 1977   Allergies Allergen Reactions Ciprofloxacin Hives Penicillins Hives Took one dose of PCN in February 2023 before shoulder surgery and tolerate Sulfa (Sulfonamide Antibiotics) Other (See Comments) Other Reaction: RXN AS CHILD  Current Outpatient Medications on File Prior to Visit Medication Sig Dispense Refill ascorbic acid, vitamin C, (VITAMIN C) 500 MG tablet Take 500 mg by mouth every morning aspirin 81 MG EC tablet Take 81 mg by mouth at bedtime atorvastatin (LIPITOR) 20 MG tablet Take 20 mg by mouth at bedtime blood glucose diagnostic (ACCU-CHEK AVIVA PLUS TEST STRP) test strip CHECK BLOOD SUGAR FOUR TIMES DAILY AS  NEEDED blood glucose diagnostic (ACCU-CHEK AVIVA PLUS TEST STRP) test strip CHECK BLOOD SUGAR FOUR TIMES DAILY AS NEEDED blood glucose diagnostic test strip CHECK BLOOD SUGAR 4 TIMES A DAY AS NEEDED cholecalciferol (VITAMIN D3) 1,000 unit capsule Take 1 tablet by mouth at bedtime clopidogreL (PLAVIX) 75 mg tablet Take 75 mg by mouth every morning hydrochlorothiazide (HYDRODIURIL) 25 MG tablet Take 25 mg by mouth every morning insulin detemir (LEVEMIR FLEXPEN) 100 unit/mL (3 mL) pen injector Inject 45 Units subcutaneously every evening 1900 insulin needles, disposable, (BD INSULIN PEN NEEDLE UF SHORT) 31 X 5/16 " needle 31 X 5/16 " lancets Use to check blood sugar 4 times daily as needed. 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 lisinopriL (ZESTRIL) 20 MG tablet Take 20 mg by mouth every morning metFORMIN (GLUCOPHAGE) 1000 MG tablet Take 1,000 mg by mouth 2 (two) times daily omega 3-dha-epa-fish oil (FISH OIL) 1,000 mg (120 mg-180 mg) Cap Take by mouth Bifidobacterium infantis (ALIGN) 4 mg capsule Take 1 capsule by mouth every morning conjugated estrogens (PREMARIN) 0.625 mg/gram vaginal cream Apply half a gram (1/4 applicator) in the vagina every night for two weeks, then decrease to half a gram intravaginally three times weekly. 42.5 g 4 nitroGLYcerin (NITROSTAT) 0.4 MG SL tablet Place 0.4 mg under the tongue every 5 (five) minutes as needed  No current facility-administered medications on file prior to visit.  Family History Problem Relation Age of Onset Glaucoma Mother Osteoporosis (Thinning of bones) Mother Aneurysm Mother High blood pressure (Hypertension) Mother Thyroid disease Mother Heart disease Mother Myocardial Infarction (Heart attack) Mother Diabetes Paternal Aunt Prostate cancer Son 30 Macular degeneration Neg Hx Blindness Neg Hx Anesthesia problems Neg Hx   Social History  Tobacco Use Smoking Status Never Smokeless Tobacco Never   Social History  Socioeconomic  History Marital status: Married Number of children: 0 Highest education level: Master's degree (e.g., MA, MS, MEng, MEd, MSW, MBA) Occupational History Occupation: retired Pharmacist, hospital Tobacco Use Smoking status: Never Smokeless tobacco: Never Vaping Use Vaping Use: Never used Substance and Sexual Activity Alcohol use: Not Currently Comment: 1 glass every month or so Drug use: Never Sexual activity: Not Currently Partners: Male Other Topics Concern Would you please tell us about the people who live in your home, your pets, or anything else important to your social life? Yes Comment: live with husband only Social History Narrative Married with grown children  Objective:  Vitals: BP: 124/80 Pulse: 93 SpO2: 97% Weight: 68.4 kg (150 lb 12.8 oz) Height:  162.6 cm ('5\' 4"'$ )  Body mass index is 25.88 kg/m.  Physical Exam  GENERAL APPEARANCE Comfortable, no acute issues Development: normal Gross deformities: none  SKIN Rash, lesions, ulcers: none Induration, erythema: none Nodules: none palpable  EYES Conjunctiva and lids: normal Pupils: equal and reactive  EARS, NOSE, MOUTH, THROAT External ears: no lesion or deformity External nose: no lesion or deformity Hearing: grossly normal  NECK Symmetric: yes Trachea: midline Thyroid: no palpable nodules in the thyroid bed  CHEST Respiratory effort: normal Retraction or accessory muscle use: no Breath sounds: normal bilaterally Rales, rhonchi, wheeze: none  CARDIOVASCULAR Auscultation: regular rhythm, normal rate Murmurs: none Pulses: radial pulse 2+ palpable Lower extremity edema: none  ABDOMEN Not assessed  GENITOURINARY/RECTAL Not assessed  MUSCULOSKELETAL Station and gait: normal Digits and nails: no clubbing or cyanosis Muscle strength: grossly normal all extremities Range of motion: grossly normal all extremities Deformity: none  LYMPHATIC Cervical: none palpable Supraclavicular: none  palpable  PSYCHIATRIC Oriented to person, place, and time: yes Mood and affect: normal for situation Judgment and insight: appropriate for situation   Assessment and Plan:  Hypercalcemia  History of nephrolithiasis  Osteopenia, unspecified location  Patient is referred by her primary care physician for surgical assessment of possible primary hyperparathyroidism.  Patient provided with a copy of "Parathyroid Surgery: Treatment for Your Parathyroid Gland Problem", published by [employer], 12 pages. Book reviewed and explained to patient during visit today.  Patient has a longstanding history of hypercalcemia. This did not improve with discontinuation of her hydrochlorothiazide. However, additional laboratory testing shows an appropriately suppressed PTH level and a normal vitamin D level. It is possible that the patient has a normal serum calcium level which is just slightly outside of the normal range. It does not appear that the level has been over 11.0 for any significant length of time. There are studies that show that for patients that are relatively asymptomatic, a level of serum calcium less than 11 can be tolerated without operative intervention. I would like to evaluate her little further with a 24-hour urine collection for calcium and we will obtain an ultrasound examination of the neck. If either of these are indicative of primary hyperparathyroidism and the presence of a parathyroid adenoma, then we will obtain a nuclear medicine parathyroid scan. If both of these studies are negative for any evidence of hyperparathyroidism, then I believe the patient can be safely monitored by her primary care physician without operative intervention.  We will obtain the above studies. I will be in touch with the patient with the results when they are available.  Armandina Gemma, MD [facility] [place] practice Office: [phone]

## 2022-09-22 NOTE — Anesthesia Preprocedure Evaluation (Addendum)
Anesthesia Evaluation  Patient identified by MRN, date of birth, ID band Patient awake    Reviewed: Allergy & Precautions, NPO status , Patient's Chart, lab work & pertinent test results  History of Anesthesia Complications (+) PONV and history of anesthetic complications  Airway Mallampati: III  TM Distance: >3 FB Neck ROM: Full    Dental  (+) Teeth Intact, Dental Advisory Given   Pulmonary asthma , sleep apnea    breath sounds clear to auscultation       Cardiovascular hypertension, Pt. on medications + CAD  + Valvular Problems/Murmurs AS  Rhythm:Regular Rate:Normal     Neuro/Psych negative neurological ROS  negative psych ROS   GI/Hepatic negative GI ROS, Neg liver ROS,,,  Endo/Other  diabetes, Type 2, Insulin Dependent, Oral Hypoglycemic Agents    Renal/GU Renal disease     Musculoskeletal  (+) Arthritis ,    Abdominal   Peds  Hematology negative hematology ROS (+)   Anesthesia Other Findings   Reproductive/Obstetrics                             Anesthesia Physical Anesthesia Plan  ASA: 3  Anesthesia Plan: General   Post-op Pain Management:    Induction: Intravenous  PONV Risk Score and Plan: 4 or greater and Ondansetron, Dexamethasone, Midazolam and Scopolamine patch - Pre-op  Airway Management Planned: Oral ETT  Additional Equipment: None  Intra-op Plan:   Post-operative Plan: Extubation in OR  Informed Consent: I have reviewed the patients History and Physical, chart, labs and discussed the procedure including the risks, benefits and alternatives for the proposed anesthesia with the patient or authorized representative who has indicated his/her understanding and acceptance.     Dental advisory given  Plan Discussed with: CRNA  Anesthesia Plan Comments: (See PAT note 09/14/2022)       Anesthesia Quick Evaluation

## 2022-09-23 ENCOUNTER — Ambulatory Visit (HOSPITAL_BASED_OUTPATIENT_CLINIC_OR_DEPARTMENT_OTHER): Payer: Medicare PPO | Admitting: Physician Assistant

## 2022-09-23 ENCOUNTER — Other Ambulatory Visit: Payer: Self-pay

## 2022-09-23 ENCOUNTER — Ambulatory Visit (HOSPITAL_COMMUNITY): Payer: Medicare PPO | Admitting: Physician Assistant

## 2022-09-23 ENCOUNTER — Encounter (HOSPITAL_COMMUNITY)
Admission: RE | Disposition: A | Discharge status: Home / Self Care | Payer: Self-pay | Source: Home / Self Care | Attending: Surgery

## 2022-09-23 ENCOUNTER — Encounter (HOSPITAL_COMMUNITY): Payer: Self-pay | Admitting: Surgery

## 2022-09-23 ENCOUNTER — Ambulatory Visit (HOSPITAL_COMMUNITY)
Admission: RE | Admit: 2022-09-23 | Discharge: 2022-09-23 | Disposition: A | Discharge status: Home / Self Care | Payer: Medicare PPO | Attending: Surgery | Admitting: Surgery

## 2022-09-23 DIAGNOSIS — M1990 Unspecified osteoarthritis, unspecified site: Secondary | ICD-10-CM | POA: Insufficient documentation

## 2022-09-23 DIAGNOSIS — E210 Primary hyperparathyroidism: Secondary | ICD-10-CM

## 2022-09-23 DIAGNOSIS — I2510 Atherosclerotic heart disease of native coronary artery without angina pectoris: Secondary | ICD-10-CM

## 2022-09-23 DIAGNOSIS — E119 Type 2 diabetes mellitus without complications: Secondary | ICD-10-CM | POA: Insufficient documentation

## 2022-09-23 DIAGNOSIS — Z01818 Encounter for other preprocedural examination: Secondary | ICD-10-CM

## 2022-09-23 DIAGNOSIS — I10 Essential (primary) hypertension: Secondary | ICD-10-CM

## 2022-09-23 DIAGNOSIS — Z794 Long term (current) use of insulin: Secondary | ICD-10-CM | POA: Insufficient documentation

## 2022-09-23 DIAGNOSIS — Z7984 Long term (current) use of oral hypoglycemic drugs: Secondary | ICD-10-CM | POA: Diagnosis not present

## 2022-09-23 DIAGNOSIS — Z8249 Family history of ischemic heart disease and other diseases of the circulatory system: Secondary | ICD-10-CM | POA: Diagnosis not present

## 2022-09-23 DIAGNOSIS — Z79899 Other long term (current) drug therapy: Secondary | ICD-10-CM | POA: Diagnosis not present

## 2022-09-23 DIAGNOSIS — Z87442 Personal history of urinary calculi: Secondary | ICD-10-CM | POA: Insufficient documentation

## 2022-09-23 DIAGNOSIS — Z96611 Presence of right artificial shoulder joint: Secondary | ICD-10-CM | POA: Diagnosis not present

## 2022-09-23 DIAGNOSIS — D351 Benign neoplasm of parathyroid gland: Secondary | ICD-10-CM | POA: Diagnosis not present

## 2022-09-23 DIAGNOSIS — G4733 Obstructive sleep apnea (adult) (pediatric): Secondary | ICD-10-CM | POA: Insufficient documentation

## 2022-09-23 DIAGNOSIS — M8580 Other specified disorders of bone density and structure, unspecified site: Secondary | ICD-10-CM | POA: Diagnosis not present

## 2022-09-23 DIAGNOSIS — J45909 Unspecified asthma, uncomplicated: Secondary | ICD-10-CM | POA: Insufficient documentation

## 2022-09-23 DIAGNOSIS — G4730 Sleep apnea, unspecified: Secondary | ICD-10-CM | POA: Diagnosis not present

## 2022-09-23 DIAGNOSIS — Z8349 Family history of other endocrine, nutritional and metabolic diseases: Secondary | ICD-10-CM | POA: Insufficient documentation

## 2022-09-23 HISTORY — PX: PARATHYROIDECTOMY: SHX19

## 2022-09-23 LAB — GLUCOSE, CAPILLARY
Glucose-Capillary: 166 mg/dL — ABNORMAL HIGH (ref 70–99)
Glucose-Capillary: 167 mg/dL — ABNORMAL HIGH (ref 70–99)

## 2022-09-23 SURGERY — PARATHYROIDECTOMY
Anesthesia: General | Laterality: Right

## 2022-09-23 MED ORDER — FENTANYL CITRATE (PF) 250 MCG/5ML IJ SOLN
INTRAMUSCULAR | Status: AC
  Filled 2022-09-23: qty 5

## 2022-09-23 MED ORDER — FENTANYL CITRATE PF 50 MCG/ML IJ SOSY
25.0000 ug | PREFILLED_SYRINGE | INTRAMUSCULAR | Status: DC | PRN
  Administered 2022-09-23: 25 ug via INTRAVENOUS

## 2022-09-23 MED ORDER — PROPOFOL 10 MG/ML IV BOLUS
INTRAVENOUS | Status: DC | PRN
  Administered 2022-09-23: 80 mg via INTRAVENOUS

## 2022-09-23 MED ORDER — ONDANSETRON HCL 4 MG/2ML IJ SOLN
INTRAMUSCULAR | Status: AC
  Filled 2022-09-23: qty 2

## 2022-09-23 MED ORDER — BUPIVACAINE-EPINEPHRINE (PF) 0.5% -1:200000 IJ SOLN
INTRAMUSCULAR | Status: DC | PRN
  Administered 2022-09-23: 10 mL

## 2022-09-23 MED ORDER — MIDAZOLAM HCL 2 MG/2ML IJ SOLN
INTRAMUSCULAR | Status: AC
  Filled 2022-09-23: qty 2

## 2022-09-23 MED ORDER — ROCURONIUM BROMIDE 10 MG/ML (PF) SYRINGE
PREFILLED_SYRINGE | INTRAVENOUS | Status: DC | PRN
  Administered 2022-09-23: 50 mg via INTRAVENOUS

## 2022-09-23 MED ORDER — ORAL CARE MOUTH RINSE: 15.0000 mL | Freq: Once | OROMUCOSAL | Status: AC

## 2022-09-23 MED ORDER — PROMETHAZINE HCL 25 MG/ML IJ SOLN: 6.2500 mg | INTRAMUSCULAR | Status: DC | PRN

## 2022-09-23 MED ORDER — DEXAMETHASONE SODIUM PHOSPHATE 10 MG/ML IJ SOLN
INTRAMUSCULAR | Status: AC
  Filled 2022-09-23: qty 1

## 2022-09-23 MED ORDER — CHLORHEXIDINE GLUCONATE CLOTH 2 % EX PADS
6.0000 | MEDICATED_PAD | Freq: Once | CUTANEOUS | Status: AC
  Administered 2022-09-23: 6 via TOPICAL

## 2022-09-23 MED ORDER — SUGAMMADEX SODIUM 200 MG/2ML IV SOLN
INTRAVENOUS | Status: DC | PRN
  Administered 2022-09-23: 250 mg via INTRAVENOUS

## 2022-09-23 MED ORDER — ACETAMINOPHEN 10 MG/ML IV SOLN
INTRAVENOUS | Status: AC
  Filled 2022-09-23: qty 100

## 2022-09-23 MED ORDER — VANCOMYCIN HCL IN DEXTROSE 1-5 GM/200ML-% IV SOLN
1000.0000 mg | INTRAVENOUS | Status: AC
  Administered 2022-09-23: 1000 mg via INTRAVENOUS
  Filled 2022-09-23: qty 200

## 2022-09-23 MED ORDER — EPHEDRINE 5 MG/ML INJ
INTRAVENOUS | Status: AC
  Filled 2022-09-23: qty 5

## 2022-09-23 MED ORDER — LIDOCAINE 2% (20 MG/ML) 5 ML SYRINGE
INTRAMUSCULAR | Status: DC | PRN
  Administered 2022-09-23: 40 mg via INTRAVENOUS
  Administered 2022-09-23: 60 mg via INTRAVENOUS

## 2022-09-23 MED ORDER — ONDANSETRON HCL 4 MG/2ML IJ SOLN
INTRAMUSCULAR | Status: DC | PRN
  Administered 2022-09-23: 4 mg via INTRAVENOUS

## 2022-09-23 MED ORDER — HEMOSTATIC AGENTS (NO CHARGE) OPTIME
TOPICAL | Status: DC | PRN
  Administered 2022-09-23: 1 via TOPICAL

## 2022-09-23 MED ORDER — ROCURONIUM BROMIDE 10 MG/ML (PF) SYRINGE
PREFILLED_SYRINGE | INTRAVENOUS | Status: AC
  Filled 2022-09-23: qty 10

## 2022-09-23 MED ORDER — BUPIVACAINE-EPINEPHRINE (PF) 0.5% -1:200000 IJ SOLN
INTRAMUSCULAR | Status: AC
  Filled 2022-09-23: qty 30

## 2022-09-23 MED ORDER — CHLORHEXIDINE GLUCONATE CLOTH 2 % EX PADS: 6.0000 | MEDICATED_PAD | Freq: Once | CUTANEOUS | Status: DC

## 2022-09-23 MED ORDER — CHLORHEXIDINE GLUCONATE 0.12 % MT SOLN
15.0000 mL | Freq: Once | OROMUCOSAL | Status: AC
  Administered 2022-09-23: 15 mL via OROMUCOSAL

## 2022-09-23 MED ORDER — ACETAMINOPHEN 160 MG/5ML PO SOLN: 325.0000 mg | ORAL | Status: DC | PRN

## 2022-09-23 MED ORDER — 0.9 % SODIUM CHLORIDE (POUR BTL) OPTIME
TOPICAL | Status: DC | PRN
  Administered 2022-09-23: 1000 mL

## 2022-09-23 MED ORDER — OXYCODONE HCL 5 MG/5ML PO SOLN: 5.0000 mg | Freq: Once | ORAL | Status: DC | PRN

## 2022-09-23 MED ORDER — FENTANYL CITRATE (PF) 250 MCG/5ML IJ SOLN
INTRAMUSCULAR | Status: DC | PRN
  Administered 2022-09-23: 50 ug via INTRAVENOUS
  Administered 2022-09-23: 25 ug via INTRAVENOUS
  Administered 2022-09-23: 50 ug via INTRAVENOUS
  Administered 2022-09-23 (×2): 25 ug via INTRAVENOUS

## 2022-09-23 MED ORDER — PHENYLEPHRINE HCL (PRESSORS) 10 MG/ML IV SOLN
INTRAVENOUS | Status: AC
  Filled 2022-09-23: qty 1

## 2022-09-23 MED ORDER — PROPOFOL 10 MG/ML IV BOLUS
INTRAVENOUS | Status: AC
  Filled 2022-09-23: qty 20

## 2022-09-23 MED ORDER — TRAMADOL HCL 50 MG PO TABS: 50.0000 mg | ORAL_TABLET | Freq: Four times a day (QID) | ORAL | 0 refills | Status: DC | PRN

## 2022-09-23 MED ORDER — PHENYLEPHRINE HCL-NACL 20-0.9 MG/250ML-% IV SOLN
INTRAVENOUS | Status: DC | PRN
  Administered 2022-09-23: 25 ug/min via INTRAVENOUS

## 2022-09-23 MED ORDER — PHENYLEPHRINE 80 MCG/ML (10ML) SYRINGE FOR IV PUSH (FOR BLOOD PRESSURE SUPPORT)
PREFILLED_SYRINGE | INTRAVENOUS | Status: AC
  Filled 2022-09-23: qty 10

## 2022-09-23 MED ORDER — LACTATED RINGERS IV SOLN: INTRAVENOUS | Status: DC

## 2022-09-23 MED ORDER — DEXAMETHASONE SODIUM PHOSPHATE 10 MG/ML IJ SOLN
INTRAMUSCULAR | Status: DC | PRN
  Administered 2022-09-23: 5 mg via INTRAVENOUS

## 2022-09-23 MED ORDER — ACETAMINOPHEN 325 MG PO TABS: 325.0000 mg | ORAL_TABLET | ORAL | Status: DC | PRN

## 2022-09-23 MED ORDER — LIDOCAINE HCL (PF) 2 % IJ SOLN
INTRAMUSCULAR | Status: AC
  Filled 2022-09-23: qty 15

## 2022-09-23 MED ORDER — ACETAMINOPHEN 10 MG/ML IV SOLN
1000.0000 mg | Freq: Once | INTRAVENOUS | Status: DC | PRN
  Administered 2022-09-23: 1000 mg via INTRAVENOUS

## 2022-09-23 MED ORDER — FENTANYL CITRATE PF 50 MCG/ML IJ SOSY
PREFILLED_SYRINGE | INTRAMUSCULAR | Status: AC
  Filled 2022-09-23: qty 1

## 2022-09-23 MED ORDER — OXYCODONE HCL 5 MG PO TABS: 5.0000 mg | ORAL_TABLET | Freq: Once | ORAL | Status: DC | PRN

## 2022-09-23 MED ORDER — AMISULPRIDE (ANTIEMETIC) 5 MG/2ML IV SOLN: 10.0000 mg | Freq: Once | INTRAVENOUS | Status: DC | PRN

## 2022-09-23 SURGICAL SUPPLY — 32 items
ATTRACTOMAT 16X20 MAGNETIC DRP (DRAPES) ×1 IMPLANT
BAG COUNTER SPONGE SURGICOUNT (BAG) ×1 IMPLANT
BLADE SURG 15 STRL LF DISP TIS (BLADE) ×1 IMPLANT
BLADE SURG 15 STRL SS (BLADE) ×1
CHLORAPREP W/TINT 26 (MISCELLANEOUS) ×1 IMPLANT
CLIP TI MEDIUM 6 (CLIP) ×2 IMPLANT
CLIP TI WIDE RED SMALL 6 (CLIP) ×2 IMPLANT
COVER SURGICAL LIGHT HANDLE (MISCELLANEOUS) ×1 IMPLANT
DERMABOND ADVANCED .7 DNX12 (GAUZE/BANDAGES/DRESSINGS) ×1 IMPLANT
DRAPE LAPAROTOMY T 98X78 PEDS (DRAPES) ×1 IMPLANT
DRAPE UTILITY XL STRL (DRAPES) ×1 IMPLANT
ELECT REM PT RETURN 15FT ADLT (MISCELLANEOUS) ×1 IMPLANT
GAUZE 4X4 16PLY ~~LOC~~+RFID DBL (SPONGE) ×1 IMPLANT
GLOVE SURG ORTHO 8.0 STRL STRW (GLOVE) ×1 IMPLANT
GOWN STRL REUS W/ TWL XL LVL3 (GOWN DISPOSABLE) ×3 IMPLANT
GOWN STRL REUS W/TWL XL LVL3 (GOWN DISPOSABLE) ×3
HEMOSTAT SURGICEL 2X4 FIBR (HEMOSTASIS) ×1 IMPLANT
ILLUMINATOR WAVEGUIDE N/F (MISCELLANEOUS) IMPLANT
KIT BASIN OR (CUSTOM PROCEDURE TRAY) ×1 IMPLANT
KIT TURNOVER KIT A (KITS) IMPLANT
NDL HYPO 25X1 1.5 SAFETY (NEEDLE) ×1 IMPLANT
NEEDLE HYPO 25X1 1.5 SAFETY (NEEDLE) ×1 IMPLANT
PACK BASIC VI WITH GOWN DISP (CUSTOM PROCEDURE TRAY) ×1 IMPLANT
PENCIL SMOKE EVACUATOR (MISCELLANEOUS) ×1 IMPLANT
SHEARS HARMONIC 9CM CVD (BLADE) IMPLANT
SUT MNCRL AB 4-0 PS2 18 (SUTURE) ×1 IMPLANT
SUT VIC AB 3-0 SH 18 (SUTURE) ×1 IMPLANT
SYR BULB IRRIG 60ML STRL (SYRINGE) ×1 IMPLANT
SYR CONTROL 10ML LL (SYRINGE) ×1 IMPLANT
TOWEL OR 17X26 10 PK STRL BLUE (TOWEL DISPOSABLE) ×1 IMPLANT
TOWEL OR NON WOVEN STRL DISP B (DISPOSABLE) ×1 IMPLANT
TUBING CONNECTING 10 (TUBING) ×1 IMPLANT

## 2022-09-23 NOTE — Anesthesia Postprocedure Evaluation (Signed)
Anesthesia Post Note  Patient: Gloria [name]  Procedure(s) Performed: RIGHT INFERIOR PARATHYROIDECTOMY AND RIGHT SUPERIOR PARATHYROID BIOPSY (Right)     Patient location during evaluation: PACU Anesthesia Type: General Level of consciousness: awake and alert Pain management: pain level controlled Vital Signs Assessment: post-procedure vital signs reviewed and stable Respiratory status: spontaneous breathing, nonlabored ventilation, respiratory function stable and patient connected to nasal cannula oxygen Cardiovascular status: blood pressure returned to baseline and stable Postop Assessment: no apparent nausea or vomiting Anesthetic complications: no   No notable events documented.  Last Vitals:  Vitals:   09/23/22 1050 09/23/22 1100  BP: (!) 168/84 (!) 168/84  Pulse: 72 69  Resp: 13 10  Temp: 36.6 C 36.6 C  SpO2: 96% 96%    Last Pain:  Vitals:   09/23/22 1100  TempSrc:   PainSc: Plato Lydia Meng

## 2022-09-23 NOTE — Anesthesia Procedure Notes (Addendum)
Procedure Name: Intubation Date/Time: 09/23/2022 8:45 AM  Performed by: Effie Berkshire, MDPre-anesthesia Checklist: Patient identified, Emergency Drugs available, Suction available and Patient being monitored Patient Re-evaluated:Patient Re-evaluated prior to induction Oxygen Delivery Method: Circle System Utilized Preoxygenation: Pre-oxygenation with 100% oxygen Induction Type: IV induction and Cricoid Pressure applied Ventilation: Mask ventilation without difficulty Laryngoscope Size: Mac and 4 Grade View: Grade II Tube type: Oral Number of attempts: 1 Airway Equipment and Method: Stylet Placement Confirmation: ETT inserted through vocal cords under direct vision, positive ETCO2 and breath sounds checked- equal and bilateral Secured at: 22 cm Tube secured with: Tape Dental Injury: Teeth and Oropharynx as per pre-operative assessment  Comments: IV induction Hollis, Easy mask ventilation---  DL AM CRNA with Sabra Heck 2-- poor view -- only tip of arytenoids-- unable to pass ETT- Hollis providing neck support & gentle cricoid pressure -- requested Glidscope due to concern over maintaining  physiological neck mobility --- teeth as preop-- chipping present on front and remains unchanged after DL  Hollis DL with MAC 4 x1 -- Grade 2 view with gentle cricoid pressure, no head lifting. Bilat BS [employer]

## 2022-09-23 NOTE — Interval H&P Note (Signed)
History and Physical Interval Note:  09/23/2022 8:18 AM  Gloria [name]  has presented today for surgery, with the diagnosis of PRIMARY HYPERPARATHYROIDISM.  The various methods of treatment have been discussed with the patient and family. After consideration of risks, benefits and other options for treatment, the patient has consented to    Procedure(s): RIGHT SUPERIOR PARATHYROIDECTOMY (Right) as a surgical intervention.    The patient's history has been reviewed, patient examined, no change in status, stable for surgery.  I have reviewed the patient's chart and labs.  Questions were answered to the patient's satisfaction.    Armandina Gemma, [facility] [place] practice Office: [place]

## 2022-09-23 NOTE — Anesthesia Procedure Notes (Signed)
Date/Time: 09/23/2022 10:15 AM  Performed by: Cynda Familia, CRNAOxygen Delivery Method: Simple face mask Placement Confirmation: positive ETCO2 and breath sounds checked- equal and bilateral Dental Injury: Teeth and Oropharynx as per pre-operative assessment

## 2022-09-23 NOTE — Discharge Instructions (Addendum)
[facility] [place] [facility] - Dr. Todd Gerkin  THYROID & PARATHYROID SURGERY:  POST-OP INSTRUCTIONS  Always review the instruction sheet provided by the hospital nurse at discharge.  A prescription for pain medication may be sent to your pharmacy at the time of discharge.  Take your pain medication as prescribed.  If narcotic pain medicine is not needed, then you may take acetaminophen (Tylenol) or ibuprofen (Advil) as needed for pain or soreness.  Take your normal home medications as prescribed unless otherwise directed.  If you need a refill on your pain medication, please contact the office during regular business hours.  Prescriptions will not be processed by the office after 5:00PM or on weekends.  Start with a light diet upon arrival home, such as soup and crackers or toast.  Be sure to drink plenty of fluids.  Resume your normal diet the day after surgery.  Most patients will experience some swelling and bruising on the chest and neck area.  Ice packs will help for the first 48 hours after arriving home.  Swelling and bruising will take several days to resolve.   It is common to experience some constipation after surgery.  Increasing fluid intake and taking a stool softener (Colace) will usually help to prevent this problem.  A mild laxative (Milk of Magnesia or Miralax) should be taken according to package directions if there has been no bowel movement after 48 hours.  Dermabond glue covers your incision. This seals the wound and you may shower at any time. The Dermabond will remain in place for about a week.  You may gradually remove the glue when it loosens around the edges.  If you need to loosen the Dermabond for removal, apply a layer of Vaseline to the wound for 15 minutes and then remove with a Kleenex. Your sutures are under the skin and will not show - they will dissolve on their own.  You may resume light daily activities beginning the day after discharge (such as self-care,  walking, climbing stairs), gradually increasing activities as tolerated. You may have sexual intercourse when it is comfortable. Refrain from any heavy lifting or straining until approved by your doctor. You may drive when you no longer are taking prescription pain medication, you can comfortably wear a seatbelt, and you can safely maneuver your car and apply the brakes.  You will see your doctor in the office for a follow-up appointment approximately three weeks after your surgery.  Make sure that you call for this appointment within a day or two after you arrive home to insure a convenient appointment time. Please have any requested laboratory tests performed a few days prior to your office visit so that the results will be available at your follow up appointment.  WHEN TO CALL THE [facility] OFFICE: -- Fever greater than 101.5 -- Inability to urinate -- Nausea and/or vomiting - persistent -- Extreme swelling or bruising -- Continued bleeding from incision -- Increased pain, redness, or drainage from the incision -- Difficulty swallowing or breathing -- Muscle cramping or spasms -- Numbness or tingling in hands or around lips  The clinic staff is available to answer your questions during regular business hours.  Please don't hesitate to call and ask to speak to one of the nurses if you have concerns.  [facility] OFFICE: [phone] (24 hours)  Please sign up for MyChart accounts. This will allow you to communicate directly with my nurse or myself without having to call the office. It will also allow you   to view your test results. You will need to enroll in MyChart for my office ([facility]) and for the hospital ([place]).  Todd Gerkin, MD [facility] practice 

## 2022-09-23 NOTE — Transfer of Care (Signed)
Immediate Anesthesia Transfer of Care Note  Patient: Gloria [name]  Procedure(s) Performed: RIGHT INFERIOR PARATHYROIDECTOMY AND RIGHT SUPERIOR PARATHYROID BIOPSY (Right)  Patient Location: PACU  Anesthesia Type:General  Level of Consciousness: sedated  Airway & Oxygen Therapy: Patient Spontanous Breathing and Patient connected to face mask oxygen  Post-op Assessment: Report given to RN and Post -op Vital signs reviewed and stable  Post vital signs: Reviewed and stable  Last Vitals:  Vitals Value Taken Time  BP    Temp    Pulse 77 09/23/22 1024  Resp 15 09/23/22 1024  SpO2 100 % 09/23/22 1024  Vitals shown include unvalidated device data.  Last Pain:  Vitals:   09/23/22 0704  TempSrc:   PainSc: 0-No pain         Complications: No notable events documented.

## 2022-09-23 NOTE — Op Note (Signed)
Operative Note  Pre-operative Diagnosis:  primary hyperparathyroidism  Post-operative Diagnosis:  same  Surgeon:  Armandina Gemma, MD  Assistant:  none   Procedure:  Neck exploration, right inferior parathyroidectomy, biopsy of right superior parathyroid gland  Anesthesia:  general  Estimated Blood Loss:  10 cc  Drains: none         Specimen: to pathology  Indications:  Patient is referred by Dr. Leonides Cave for surgical evaluation and recommendations regarding hypercalcemia and possible primary hyperparathyroidism. Patient has a longstanding history of hypercalcemia. Post recently her level was 10.7. Patient had been taking hydrochlorothiazide for many years. This was discontinued in April 2023 without significant change in her serum calcium level. Additional laboratories included a intact PTH level which was appropriately suppressed at 17 and a normal 25-hydroxy vitamin D level of 47.46. Patient has not had any imaging studies. She has not had a 24-hour urine collection for calcium. Patient does have known osteopenia. She has had a recent right shoulder replacement surgery. She has a history of nephrolithiasis. She denies any significant fatigue. She has had no prior head or neck surgery. There is a family history of hyperparathyroidism in the patient's mother who was treated with radioactive iodine. 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  Patient underwent an ultrasound examination of the neck, a nuclear medicine parathyroid scan, and a 4D CT scan of the neck.  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  Patient now comes to surgery for exploration and parathyroidectomy.   Procedure:  The patient was seen in the pre-op holding area. The risks, benefits, complications, treatment options, and expected outcomes were previously discussed with the patient. The patient agreed with the proposed plan and has signed the informed consent form.  The  patient was brought to the operating room by the surgical team, identified as Gloria [name] and the procedure verified. A "time out" was completed and the above information confirmed.  Following administration of general anesthesia, the patient was positioned and then prepped and draped in the usual aseptic fashion.  After ascertaining that an adequate level of anesthesia been achieved, an anterior cervical incision is made with a #15 blade.  Dissection is carried through subcutaneous tissues and platysma.  Hemostasis is achieved with the electrocautery.  Subplatysmal flaps are developed circumferentially and a self-retaining retractor is placed.  Strap muscles are incised in the midline and reflected to the right exposing the right thyroid lobe which appears normal.  Right lobe was gently mobilized.  Venous tributaries are divided between small ligaclips.  Exploration posterior to the right thyroid lobe shows a prominent tubercle of Zuckerkandl which may be the nodular mass which was identified on CT scan.  Inferior thyroid artery was identified.  The right recurrent laryngeal nerve was identified and preserved along its course. A right superior parathyroid gland was identified which appeared normal.  Dissection just inferior to the artery revealed an enlarged slightly nodular parathyroid gland consistent with a small right inferior parathyroid adenoma.  This gland was excised in its entirety dividing the vascular pedicle between small ligaclips.  It was submitted to pathology where frozen section by Dr. Claudette Laws confirmed hypercellular parathyroid tissue consistent with a small adenoma.  The superior parathyroid was biopsied using a medium Ligaclip and excising sharply a small amount of tissue.  This was submitted for frozen section and Dr. Claudette Laws confirmed normal parathyroid tissue.  Neck was irrigated with warm saline.  Fibrillar was  placed throughout the operative field.  Strap muscles are  reapproximated in the midline of interrupted 3-0 Vicryl sutures.  Platysma is closed with interrupted 3-0 Vicryl sutures.  Skin is anesthetized with local anesthetic.  Skin edges are reapproximated with a running 4-0 Monocryl subcuticular suture.  Wound is washed and dried and Dermabond is placed as dressing.  Patient is awakened from anesthesia and transferred to the recovery room in stable condition.  The patient tolerated the procedure well.   Armandina Gemma, [facility]: [phone]

## 2022-09-24 ENCOUNTER — Encounter (HOSPITAL_COMMUNITY): Payer: Self-pay | Admitting: Surgery

## 2022-09-24 ENCOUNTER — Other Ambulatory Visit: Payer: Self-pay | Admitting: *Deleted

## 2022-09-24 LAB — SURGICAL PATHOLOGY

## 2022-09-24 MED ORDER — NITROGLYCERIN 0.4 MG SL SUBL: 0.4000 mg | SUBLINGUAL_TABLET | SUBLINGUAL | 3 refills | Status: AC | PRN

## 2022-09-28 ENCOUNTER — Other Ambulatory Visit: Payer: Self-pay | Admitting: Family Medicine

## 2022-09-28 DIAGNOSIS — E119 Type 2 diabetes mellitus without complications: Secondary | ICD-10-CM

## 2022-09-29 DIAGNOSIS — N819 Female genital prolapse, unspecified: Secondary | ICD-10-CM | POA: Diagnosis not present

## 2022-10-06 DIAGNOSIS — N816 Rectocele: Secondary | ICD-10-CM | POA: Diagnosis not present

## 2022-10-06 DIAGNOSIS — Z4689 Encounter for fitting and adjustment of other specified devices: Secondary | ICD-10-CM | POA: Diagnosis not present

## 2022-10-18 ENCOUNTER — Ambulatory Visit: Payer: Medicare PPO | Admitting: Family Medicine

## 2022-11-15 ENCOUNTER — Ambulatory Visit: Payer: Medicare PPO | Admitting: Family Medicine

## 2022-11-15 ENCOUNTER — Encounter: Payer: Self-pay | Admitting: Family Medicine

## 2022-11-15 DIAGNOSIS — E210 Primary hyperparathyroidism: Secondary | ICD-10-CM

## 2022-11-15 DIAGNOSIS — E119 Type 2 diabetes mellitus without complications: Secondary | ICD-10-CM | POA: Diagnosis not present

## 2022-11-15 DIAGNOSIS — M25512 Pain in left shoulder: Secondary | ICD-10-CM | POA: Diagnosis not present

## 2022-11-15 LAB — HEMOGLOBIN A1C: Hgb A1c MFr Bld: 8 % — ABNORMAL HIGH (ref 4.6–6.5)

## 2022-11-15 LAB — MICROALBUMIN / CREATININE URINE RATIO
Creatinine,U: 22 mg/dL
Microalb Creat Ratio: 3.2 mg/g (ref 0.0–30.0)
Microalb, Ur: <0.7 mg/dL (ref 0.0–1.9)

## 2022-11-15 NOTE — Patient Instructions (Addendum)
Go to the lab on the way out.   If you have mychart we'll likely use that to update you.    Take care.  Glad to see you. Try compression stockings in the meantime.   Call Dr. Stann Mainland' office about him checking your left shoulder at the next visit.   Recheck in about 3-4 months at a yearly visit.

## 2022-11-15 NOTE — Progress Notes (Unsigned)
History of hyperparathyroidism.  Status post surgery.  D/w pt about recheck PTH and calcium.  Neck well healed. No dysphagia or dysphonia.     Diabetes:  Using medications without difficulties: yes Hypoglycemic episodes: no Hyperglycemic episodes: no Feet problems: some BLE edema, at the end of the day.   Blood Sugars averaging: ~150s eye exam within last year: yes Metformin and insulin used at baseline.   Labs pending.  See notes on labs.  D/w pt about using compression stockings for BLE.    L shoulder pain.  D/w pt about options.  Pain at night but not now.  Some days with more pain with reaching overhead.    Meds, vitals, and allergies reviewed.   ROS: Per HPI unless specifically indicated in ROS section   GEN: nad, alert and oriented HEENT: mucous membranes moist NECK: supple w/o LA CV: rrr. PULM: ctab, no inc wob ABD: soft, +bs EXT: trace BLE edema SKIN: no acute rash L shoulder pain with ext rotation but no arm drop.    Diabetic foot exam: Normal inspection No skin breakdown No calluses  Normal DP pulses Normal sensation to light touch and monofilament Nails normal

## 2022-11-16 LAB — PTH, INTACT AND CALCIUM
Calcium: 10 mg/dL (ref 8.6–10.4)
PTH: 39 pg/mL (ref 16–77)

## 2022-11-17 NOTE — Assessment & Plan Note (Signed)
Continue work on diet and exercise.  No change in medication.  Continue insulin and metformin.  See notes on labs.

## 2022-11-17 NOTE — Assessment & Plan Note (Signed)
See notes on follow-up labs.  Appreciate surgery help.  Will update patient in surgery clinic.

## 2022-11-17 NOTE — Assessment & Plan Note (Signed)
See after visit summary.  I asked her to check with orthopedics.  She agreed.

## 2022-12-06 ENCOUNTER — Ambulatory Visit: Payer: Medicare PPO | Admitting: Cardiology

## 2022-12-16 DIAGNOSIS — Z96611 Presence of right artificial shoulder joint: Secondary | ICD-10-CM | POA: Diagnosis not present

## 2022-12-16 DIAGNOSIS — M19012 Primary osteoarthritis, left shoulder: Secondary | ICD-10-CM | POA: Diagnosis not present

## 2022-12-17 DIAGNOSIS — H5213 Myopia, bilateral: Secondary | ICD-10-CM | POA: Diagnosis not present

## 2022-12-17 DIAGNOSIS — H2513 Age-related nuclear cataract, bilateral: Secondary | ICD-10-CM | POA: Diagnosis not present

## 2022-12-17 DIAGNOSIS — E119 Type 2 diabetes mellitus without complications: Secondary | ICD-10-CM | POA: Diagnosis not present

## 2022-12-17 DIAGNOSIS — H524 Presbyopia: Secondary | ICD-10-CM | POA: Diagnosis not present

## 2022-12-17 DIAGNOSIS — H52203 Unspecified astigmatism, bilateral: Secondary | ICD-10-CM | POA: Diagnosis not present

## 2022-12-17 LAB — HM DIABETES EYE EXAM

## 2022-12-21 DIAGNOSIS — N816 Rectocele: Secondary | ICD-10-CM | POA: Diagnosis not present

## 2022-12-21 DIAGNOSIS — N952 Postmenopausal atrophic vaginitis: Secondary | ICD-10-CM | POA: Diagnosis not present

## 2022-12-21 DIAGNOSIS — Z4689 Encounter for fitting and adjustment of other specified devices: Secondary | ICD-10-CM | POA: Diagnosis not present

## 2022-12-21 DIAGNOSIS — N8111 Cystocele, midline: Secondary | ICD-10-CM | POA: Diagnosis not present

## 2023-01-12 ENCOUNTER — Telehealth: Payer: Self-pay | Admitting: Family Medicine

## 2023-01-12 NOTE — Telephone Encounter (Signed)
Patient called in and stated that she is needing a prior auth for her insulin detemir (LEVEMIR FLEXPEN) 100 UNIT/ML FlexPen to be refilled. She stated that she will be out by Monday and needs it because she will be out of town. Please advise. Thank you!

## 2023-01-12 NOTE — Telephone Encounter (Signed)
PA started in [email or web address]. Key: [id number]; awaiting determination.

## 2023-01-13 ENCOUNTER — Encounter: Payer: Self-pay | Admitting: Family Medicine

## 2023-01-13 MED ORDER — LANTUS SOLOSTAR 100 UNIT/ML ~~LOC~~ SOPN: 41.0000 [IU] | PEN_INJECTOR | Freq: Every day | SUBCUTANEOUS | 99 refills | Status: DC

## 2023-01-13 NOTE — Telephone Encounter (Signed)
Will have to change since levemir will be discontinued.  This isn't an issue with med recall but production of levemir will cease.   Would change to lantus.  No change in sig.  Rx sent.  Thanks.

## 2023-01-13 NOTE — Telephone Encounter (Signed)
Replied to patients mychart message with Dr. Carole Civil response.

## 2023-01-13 NOTE — Telephone Encounter (Signed)
Pt called checking on status of PA. Told pt Duncan's response. Pt asked should she move forward with an appeal or not, since [place] prescribed a different med? Call back # [phone]

## 2023-01-13 NOTE — Addendum Note (Signed)
Addended by: Tonia Ghent on: 01/13/2023 01:44 PM   Modules accepted: Orders

## 2023-01-13 NOTE — Telephone Encounter (Signed)
PA has been denied for Levemir. Denial states: We cover this drug when our criteria are met. The unmet criteria are: has tried or cannot use at least one preferred insulin glargine product: Lantus, Lantus Solostar OR Toujeo; AND has tried or cannot use preferred insulin degludec: brand [place]. This decision was from [facility]

## 2023-01-28 ENCOUNTER — Other Ambulatory Visit: Payer: Self-pay | Admitting: Family Medicine

## 2023-01-28 DIAGNOSIS — E119 Type 2 diabetes mellitus without complications: Secondary | ICD-10-CM

## 2023-01-28 DIAGNOSIS — E210 Primary hyperparathyroidism: Secondary | ICD-10-CM

## 2023-01-31 ENCOUNTER — Telehealth: Payer: Self-pay | Admitting: Family Medicine

## 2023-01-31 NOTE — Telephone Encounter (Signed)
Contacted Gloria [name] to schedule their annual wellness visit. Appointment made for 02/03/2023.  [place] Direct Dial: [phone]

## 2023-02-02 ENCOUNTER — Telehealth: Payer: Self-pay | Admitting: Family Medicine

## 2023-02-02 NOTE — Telephone Encounter (Signed)
Contacted [name] to schedule their annual wellness visit. Appointment made for 02/03/2023. Changed appt time to 1:45  Thank you,  Colletta Maryland,  [place] Direct Dial ??[phone]

## 2023-02-03 ENCOUNTER — Ambulatory Visit (INDEPENDENT_AMBULATORY_CARE_PROVIDER_SITE_OTHER): Payer: Medicare PPO

## 2023-02-03 VITALS — Ht 64.0 in | Wt 147.0 lb

## 2023-02-03 DIAGNOSIS — Z0000 Encounter for general adult medical examination without abnormal findings: Secondary | ICD-10-CM | POA: Diagnosis not present

## 2023-02-03 NOTE — Patient Instructions (Signed)
Gloria [name] , Thank you for taking time to come for your Medicare Wellness Visit. I appreciate your ongoing commitment to your health goals. Please review the following plan we discussed and let me know if I can assist you in the future.   These are the goals we discussed:  Goals      Increase physical activity     Starting 02/02/2018, I will continue to exercise for 40-60 minutes 4 days per week.      Patient Stated     06/29/2019, wants to get back to walking     Patient Stated     Would like to maintain current routine         This is a list of the screening recommended for you and due dates:  Health Maintenance  Topic Date Due   Eye exam for diabetics  12/15/2021   COVID-19 Vaccine (5 - 2023-24 season) 07/09/2022   DTaP/Tdap/Td vaccine (3 - Tdap) 01/02/2023   Mammogram  03/13/2023   Hemoglobin A1C  05/16/2023   Yearly kidney function blood test for diabetes  09/15/2023   Yearly kidney health urinalysis for diabetes  11/16/2023   Complete foot exam   11/16/2023   Medicare Annual Wellness Visit  02/03/2024   Colon Cancer Screening  10/18/2024   Pneumonia Vaccine  Completed   Flu Shot  Completed   DEXA scan (bone density measurement)  Completed   Hepatitis C Screening: USPSTF Recommendation to screen - Ages 16-79 yo.  Completed   Zoster (Shingles) Vaccine  Completed   HPV Vaccine  Aged Out    Advanced directives: In Chart   Conditions/risks identified: Aim for 30 minutes of exercise or brisk walking, 6-8 glasses of water, and 5 servings of fruits and vegetables each day.   Next appointment: Follow up in one year for your annual wellness visit    Preventive Care 65 Years and Older, Female Preventive care refers to lifestyle choices and visits with your health care provider that can promote health and wellness. What does preventive care include? A yearly physical exam. This is also called an annual well check. Dental exams once or twice a year. Routine eye exams. Ask  your health care provider how often you should have your eyes checked. Personal lifestyle choices, including: Daily care of your teeth and gums. Regular physical activity. Eating a healthy diet. Avoiding tobacco and drug use. Limiting alcohol use. Practicing safe sex. Taking low-dose aspirin every day. Taking vitamin and mineral supplements as recommended by your health care provider. What happens during an annual well check? The services and screenings done by your health care provider during your annual well check will depend on your age, overall health, lifestyle risk factors, and family history of disease. Counseling  Your health care provider may ask you questions about your: Alcohol use. Tobacco use. Drug use. Emotional well-being. Home and relationship well-being. Sexual activity. Eating habits. History of falls. Memory and ability to understand (cognition). Work and work Statistician. Reproductive health. Screening  You may have the following tests or measurements: Height, weight, and BMI. Blood pressure. Lipid and cholesterol levels. These may be checked every 5 years, or more frequently if you are over 53 years old. Skin check. Lung cancer screening. You may have this screening every year starting at age 49 if you have a 30-pack-year history of smoking and currently smoke or have quit within the past 15 years. Fecal occult blood test (FOBT) of the stool. You may have this test every  year starting at age 2. Flexible sigmoidoscopy or colonoscopy. You may have a sigmoidoscopy every 5 years or a colonoscopy every 10 years starting at age 29. Hepatitis C blood test. Hepatitis B blood test. Sexually transmitted disease (STD) testing. Diabetes screening. This is done by checking your blood sugar (glucose) after you have not eaten for a while (fasting). You may have this done every 1-3 years. Bone density scan. This is done to screen for osteoporosis. You may have this done  starting at age 65. Mammogram. This may be done every 1-2 years. Talk to your health care provider about how often you should have regular mammograms. Talk with your health care provider about your test results, treatment options, and if necessary, the need for more tests. Vaccines  Your health care provider may recommend certain vaccines, such as: Influenza vaccine. This is recommended every year. Tetanus, diphtheria, and acellular pertussis (Tdap, Td) vaccine. You may need a Td booster every 10 years. Zoster vaccine. You may need this after age 80. Pneumococcal 13-valent conjugate (PCV13) vaccine. One dose is recommended after age 42. Pneumococcal polysaccharide (PPSV23) vaccine. One dose is recommended after age 75. Talk to your health care provider about which screenings and vaccines you need and how often you need them. This information is not intended to replace advice given to you by your health care provider. Make sure you discuss any questions you have with your health care provider. Document Released: 11/21/2015 Document Revised: 07/14/2016 Document Reviewed: 08/26/2015 Elsevier Interactive Patient Education  2017 [employer] Prevention in the Home Falls can cause injuries. They can happen to people of all ages. There are many things you can do to make your home safe and to help prevent falls. What can I do on the outside of my home? Regularly fix the edges of walkways and driveways and fix any cracks. Remove anything that might make you trip as you walk through a door, such as a raised step or threshold. Trim any bushes or trees on the path to your home. Use bright outdoor lighting. Clear any walking paths of anything that might make someone trip, such as rocks or tools. Regularly check to see if handrails are loose or broken. Make sure that both sides of any steps have handrails. Any raised decks and porches should have guardrails on the edges. Have any leaves, snow, or  ice cleared regularly. Use sand or salt on walking paths during winter. Clean up any spills in your garage right away. This includes oil or grease spills. What can I do in the bathroom? Use night lights. Install grab bars by the toilet and in the tub and shower. Do not use towel bars as grab bars. Use non-skid mats or decals in the tub or shower. If you need to sit down in the shower, use a plastic, non-slip stool. Keep the floor dry. Clean up any water that spills on the floor as soon as it happens. Remove soap buildup in the tub or shower regularly. Attach bath mats securely with double-sided non-slip rug tape. Do not have throw rugs and other things on the floor that can make you trip. What can I do in the bedroom? Use night lights. Make sure that you have a light by your bed that is easy to reach. Do not use any sheets or blankets that are too big for your bed. They should not hang down onto the floor. Have a firm chair that has side arms. You can use this  for support while you get dressed. Do not have throw rugs and other things on the floor that can make you trip. What can I do in the kitchen? Clean up any spills right away. Avoid walking on wet floors. Keep items that you use a lot in easy-to-reach places. If you need to reach something above you, use a strong step stool that has a grab bar. Keep electrical cords out of the way. Do not use floor polish or wax that makes floors slippery. If you must use wax, use non-skid floor wax. Do not have throw rugs and other things on the floor that can make you trip. What can I do with my stairs? Do not leave any items on the stairs. Make sure that there are handrails on both sides of the stairs and use them. Fix handrails that are broken or loose. Make sure that handrails are as long as the stairways. Check any carpeting to make sure that it is firmly attached to the stairs. Fix any carpet that is loose or worn. Avoid having throw rugs at  the top or bottom of the stairs. If you do have throw rugs, attach them to the floor with carpet tape. Make sure that you have a light switch at the top of the stairs and the bottom of the stairs. If you do not have them, ask someone to add them for you. What else can I do to help prevent falls? Wear shoes that: Do not have high heels. Have rubber bottoms. Are comfortable and fit you well. Are closed at the toe. Do not wear sandals. If you use a stepladder: Make sure that it is fully opened. Do not climb a closed stepladder. Make sure that both sides of the stepladder are locked into place. Ask someone to hold it for you, if possible. Clearly mark and make sure that you can see: Any grab bars or handrails. First and last steps. Where the edge of each step is. Use tools that help you move around (mobility aids) if they are needed. These include: Canes. Walkers. Scooters. Crutches. Turn on the lights when you go into a dark area. Replace any light bulbs as soon as they burn out. Set up your furniture so you have a clear path. Avoid moving your furniture around. If any of your floors are uneven, fix them. If there are any pets around you, be aware of where they are. Review your medicines with your doctor. Some medicines can make you feel dizzy. This can increase your chance of falling. Ask your doctor what other things that you can do to help prevent falls. This information is not intended to replace advice given to you by your health care provider. Make sure you discuss any questions you have with your health care provider. Document Released: 08/21/2009 Document Revised: 04/01/2016 Document Reviewed: 11/29/2014 Elsevier Interactive Patient Education  2017 [employer].

## 2023-02-03 NOTE — Progress Notes (Signed)
Subjective:   Gloria [name] is a 77 y.o. female who presents for Medicare Annual (Subsequent) preventive examination. I connected with  Gloria [name] on 02/03/23 by a audio enabled telemedicine application and verified that I am speaking with the correct person using two identifiers.  Patient Location: Home  Provider Location: Home Office  I discussed the limitations of evaluation and management by telemedicine. The patient expressed understanding and agreed to proceed.  Review of Systems     Cardiac Risk Factors include: advanced age (>15men, >28 women);diabetes mellitus;dyslipidemia;hypertension     Objective:    Today's Vitals   02/03/23 1347  Weight: 147 lb (66.7 kg)  Height: 5\' 4"  (1.626 m)   Body mass index is 25.23 kg/m.     02/03/2023    1:53 PM 09/23/2022    6:59 AM 12/18/2021   11:37 AM 12/17/2021    2:00 PM 11/26/2021    9:02 AM 03/06/2020   10:32 AM 02/26/2020    3:00 PM  Advanced Directives  Does Patient Have a Medical Advance Directive? Yes Yes Yes Yes Yes Yes Yes  Type of Paramedic of [place];Living will Living will;Healthcare Power of [place];Living will [place];Living will Living will;Healthcare Power of [place];Living will [place];Living will  Does patient want to make changes to medical advance directive? No - Patient declined  No - Patient declined Yes (MAU/Ambulatory/Procedural Areas - Information given)  No - Guardian declined No - Patient declined  Copy of [place] in Chart? Yes - validated most recent copy scanned in chart (See row information)  No - copy requested    No - copy requested  Would patient like information on creating a medical advance directive? No - Patient declined          Current Medications (verified) Outpatient Encounter Medications as of 02/03/2023  Medication Sig   ACCU-CHEK AVIVA  PLUS test strip CHECK BLOOD SUGAR FOUR TIMES DAILY AS NEEDED   ACCU-CHEK FASTCLIX LANCETS MISC Use to check blood sugar 4 times daily as needed.  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   aspirin EC 81 MG tablet Take 1 tablet (81 mg total) by mouth daily.   atorvastatin (LIPITOR) 20 MG tablet TAKE 1 TABLET BY MOUTH DAILY   B-D ULTRAFINE III SHORT PEN 31G X 8 MM MISC USE AS DIRECTED   Blood Glucose Monitoring Suppl (ACCU-CHEK AVIVA PLUS) W/DEVICE KIT Use to check blood sugar 4 times daily as needed.  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   clopidogrel (PLAVIX) 75 MG tablet TAKE 1 TABLET BY MOUTH DAILY.   conjugated estrogens (PREMARIN) vaginal cream Place 1 applicator vaginally every 14 (fourteen) days.   insulin glargine (LANTUS SOLOSTAR) 100 UNIT/ML Solostar Pen Inject 41 Units into the skin daily.   lisinopril (ZESTRIL) 20 MG tablet TAKE 1 TABLET BY MOUTH DAILY   metFORMIN (GLUCOPHAGE) 1000 MG tablet 1 tab in the AM and 1 tab at lunch   Omega-3 Fatty Acids (FISH OIL PO) Take 1 capsule by mouth daily.   Psyllium (METAMUCIL PO) Take 1 Scoop by mouth in the morning.   vitamin C (ASCORBIC ACID) 500 MG tablet Take 500 mg by mouth daily.   nitroGLYCERIN (NITROSTAT) 0.4 MG SL tablet Place 1 tablet (0.4 mg total) under the tongue every 5 (five) minutes as needed.  No facility-administered encounter medications on file as of 02/03/2023.    Allergies (verified) Sulfonamide derivatives, Ciprofloxin hcl [ciprofloxacin], and Penicillins   History: Past Medical History:  Diagnosis Date   ABDOMINAL PAIN OTHER SPECIFIED SITE 08/27/2010   Qualifier: Diagnosis of  By: Damita Dunnings MD, Phillip Heal     Advance directive discussed with patient 01/03/2012   12/2011-AD d/w pt.  She has talked to husband but doesn't have living will yet.  Full code in discussion with patient today.  Would not want prolonged interventions if she were  profoundly and/or permanently impaired, ie severe dementia or a condition with no hope of improvement.   01/13/15-Advance directive d/w pt- husband designated if patient were incapacitated.     Allergic rhinitis    Aortic stenosis    Moderate by echo 08/2022   Arthritis    Asthma    as a chld   Complication of anesthesia    Coronary artery disease    Diabetes mellitus without complication ([place]) [id number]   Qualifier: Diagnosis of  By: Council Mechanic MD, Hilaria Ota    Disorder of bone and cartilage 12/20/2007   2016 DXA: -1.4.  Consider repeat in 2021.      Female cystocele 08/10/2012   Health care maintenance 02/09/2018   Heart murmur    Hemorrhoids    History of kidney stones    Hypertension    Left shoulder pain 07/20/2016   Medicare annual wellness visit, subsequent 01/03/2012   MENOPAUSE, SURGICAL 12/16/2009   Qualifier: Diagnosis of  By: Lurlean Nanny LPN, [place]     PONV (postoperative nausea and vomiting)    Pure hypercholesterolemia 03/30/2011   RHINITIS 12/24/2010   Qualifier: Diagnosis of  By: Damita Dunnings MD, Phillip Heal     Sleep apnea    wears mouthpiece   Snoring 01/02/2020   Past Surgical History:  Procedure Laterality Date   ABDOMINAL HYSTERECTOMY     BASAL CELL CARCINOMA EXCISION  08/23/2017   left upper arm   CARPAL TUNNEL RELEASE     with bilateral releases   CORONARY ATHERECTOMY N/A 02/26/2020   Procedure: CORONARY ATHERECTOMY;  Surgeon: [name], MD;  Location: [facility];  Service: Cardiovascular;  Laterality: N/A;   CYSTECTOMY     Left breast, right lumpectomy B9   INGUINAL HERNIA REPAIR     Right (Dr. Annamaria Boots)   [place] CATH AND CORONARY ANGIOGRAPHY N/A 02/22/2020   Procedure: LEFT HEART CATH AND CORONARY ANGIOGRAPHY;  Surgeon: [name], MD;  Location: [facility];  Service: Cardiovascular;  Laterality: N/A;   NSVD     X 2   PARATHYROIDECTOMY Right 09/23/2022   Procedure: RIGHT INFERIOR PARATHYROIDECTOMY AND RIGHT SUPERIOR PARATHYROID BIOPSY;   Surgeon: Armandina Gemma, MD;  Location: [facility];  Service: General;  Laterality: Right;   REVERSE SHOULDER ARTHROPLASTY Right 12/18/2021   Procedure: REVERSE SHOULDER ARTHROPLASTY;  Surgeon: Nicholes Stairs, MD;  Location: [facility];  Service: Orthopedics;  Laterality: Right;  150   TVH with vaginal prolapse  08/29/09   Transvag Tape w/Varitensor, Ant & Post Colporraphy, Uterosacral Lig susp, McCall Culdoplasty   ueteroscopy  11/01   without laser secondary stone   Urological procedure  10/22 - 08/31/09   [facility]   Family History  Problem Relation Age of Onset   Hypertension Mother    Heart disease Mother        CHF   Breast cancer Cousin        Breast CA   Alcohol abuse Cousin  Stroke Maternal Aunt    Diabetes Paternal Aunt 30   Stroke Paternal Aunt    Heart disease Paternal Uncle        MI   Heart disease Paternal Uncle        MI   Thyroid disease Other    Prostate cancer Son    Colon cancer Neg Hx    Social History   Socioeconomic History   Marital status: Married    Spouse name: Not on file   Number of children: 2   Years of education: Not on file   Highest education level: Not on file  Occupational History   Occupation: Retired Pharmacist, hospital (primary education) from the [employer]    Employer: RETIRED  Tobacco Use   Smoking status: Never   Smokeless tobacco: Never  Vaping Use   Vaping Use: Never used  Substance and Sexual Activity   Alcohol use: Yes    Alcohol/week: 2.0 - 3.0 standard drinks of alcohol    Types: 2 - 3 Glasses of wine per week    Comment: occas.   Drug use: No   Sexual activity: Not Currently  Other Topics Concern   Not on file  Social History Narrative   Retired Pharmacist, hospital   Marital Status: Married, 1968   Children: 2 children, 2 grandkids.    Social Determinants of Health   Financial Resource Strain: Low Risk  (02/03/2023)   Overall Financial Resource Strain (CARDIA)    Difficulty of Paying Living Expenses: Not hard at all   Food Insecurity: No Food Insecurity (02/03/2023)   Hunger Vital Sign    Worried About Running Out of Food in the Last Year: Never true    Ran Out of Food in the Last Year: Never true  Transportation Needs: No Transportation Needs (02/03/2023)   PRAPARE - Hydrologist (Medical): No    Lack of Transportation (Non-Medical): No  Physical Activity: Insufficiently Active (02/03/2023)   Exercise Vital Sign    Days of Exercise per Week: 4 days    Minutes of Exercise per Session: 30 min  Stress: No Stress Concern Present (02/03/2023)   [place]    Feeling of Stress : Not at all  Social Connections: Moderately Integrated (02/03/2023)   Social Connection and Isolation Panel [NHANES]    Frequency of Communication with Friends and Family: More than three times a week    Frequency of Social Gatherings with Friends and Family: More than three times a week    Attends Religious Services: Never    Marine scientist or Organizations: Yes    Attends Music therapist: More than 4 times per year    Marital Status: Married    Tobacco Counseling Counseling given: Not Answered   Clinical Intake:  Pre-visit preparation completed: Yes  Pain : No/denies pain     Nutritional Risks: None Diabetes: Yes CBG done?: No Did pt. bring in CBG monitor from home?: No  How often do you need to have someone help you when you read instructions, pamphlets, or other written materials from your doctor or pharmacy?: 1 - Never  Diabetic?yes  Nutrition Risk Assessment:  Has the patient had any N/V/D within the last 2 months?  No  Does the patient have any non-healing wounds?  No  Has the patient had any unintentional weight loss or weight gain?  No   Diabetes:  Is the patient diabetic?  Yes  If diabetic, was a CBG obtained today?  No  Did the patient bring in their glucometer from home?  No  How  often do you monitor your CBG's? 2 x day .   Financial Strains and Diabetes Management:  Are you having any financial strains with the device, your supplies or your medication? No .  Does the patient want to be seen by Chronic Care Management for management of their diabetes?  No  Would the patient like to be referred to a Nutritionist or for Diabetic Management?  No   Diabetic Exams:  Diabetic Eye Exam: Completed 12/2022 Diabetic Foot Exam: Overdue, Pt has been advised about the importance in completing this exam. Pt is scheduled for diabetic foot exam on next office visit .   Interpreter Needed?: No  Information entered by :: Jadene Pierini, LPN   Activities of Daily Living    02/03/2023    1:54 PM 02/03/2023    1:53 PM  In your present state of health, do you have any difficulty performing the following activities:  Hearing? 0 0  Vision? 0 0  Difficulty concentrating or making decisions? 0 0  Walking or climbing stairs? 0 0  Dressing or bathing? 0 0  Doing errands, shopping? 0 0  Preparing Food and eating ? N N  Using the Toilet? N N  In the past six months, have you accidently leaked urine? N N  Do you have problems with loss of bowel control? N N  Managing your Medications? N N  Managing your Finances? N N  Housekeeping or managing your Housekeeping? N N    Patient Care Team: Tonia Ghent, MD as PCP - General (Family Medicine) Sueanne Margarita, MD as PCP - Cardiology (Cardiology)  Indicate any recent Medical Services you may have received from other than [facility] providers in the past year (date may be approximate).     Assessment:   This is a routine wellness examination for [place].  Hearing/Vision screen Vision Screening - Comments:: Wears rx glasses - up to date with routine eye exams with  [facility]   Dietary issues and exercise activities discussed: Current Exercise Habits: Home exercise routine, Type of exercise: walking, Time (Minutes): 30, Frequency  (Times/Week): 4, Weekly Exercise (Minutes/Week): 120, Intensity: Mild, Exercise limited by: None identified   Goals Addressed             This Visit's Progress    Patient Stated   On track    06/29/2019, wants to get back to walking       Depression Screen    02/03/2023    1:52 PM 12/17/2021    2:03 PM 10/06/2020    8:05 AM 06/11/2020    2:13 PM 03/10/2020    1:29 PM 06/29/2019   12:05 PM 02/02/2018    8:44 AM  PHQ 2/9 Scores  PHQ - 2 Score 0 0 0 0 0 0 0  PHQ- 9 Score    2 4 0 0    Fall Risk    02/03/2023    1:48 PM 02/01/2023    7:57 AM 12/17/2021    2:01 PM 10/06/2020    8:05 AM 03/06/2020   10:31 AM  Fall Risk   Falls in the past year? 0 0 0 0 1  Number falls in past yr: 0 0 0 0 0  Injury with Fall? 0 0 0 0 0  Risk for fall due to : No Fall Risks  No Fall Risks  Follow up Falls prevention discussed  Falls prevention discussed Falls evaluation completed Falls evaluation completed;Education provided;Falls prevention discussed    FALL RISK PREVENTION PERTAINING TO THE HOME:  Any stairs in or around the home? Yes  If so, are there any without handrails? No  Home free of loose throw rugs in walkways, pet beds, electrical cords, etc? Yes  Adequate lighting in your home to reduce risk of falls? Yes   ASSISTIVE DEVICES UTILIZED TO PREVENT FALLS:  Life alert? No  Use of a cane, walker or w/c? No  Grab bars in the bathroom? Yes  Shower chair or bench in shower? No  Elevated toilet seat or a handicapped toilet? No       06/29/2019   12:09 PM 02/02/2018    8:43 AM  MMSE - Mini Mental State Exam  Orientation to time 5 5  Orientation to Place 5 5  Registration 3 3  Attention/ Calculation 5 0  Recall 3 3  Language- name 2 objects 0 0  Language- repeat 1 1  Language- follow 3 step command 0 3  Language- read & follow direction 0 0  Write a sentence 0 0  Copy design 0 0  Total score 22 20        02/03/2023    1:54 PM  6CIT Screen  What Year? 0 points  What  month? 0 points  What time? 0 points  Count back from 20 0 points  Months in reverse 0 points  Repeat phrase 0 points  Total Score 0 points    Immunizations Immunization History  Administered Date(s) Administered   Influenza Split 09/28/2011   Influenza Whole 09/08/2005, 08/27/2010   Influenza, High Dose Seasonal PF 08/21/2016, 08/04/2022   Influenza,inj,Quad PF,6+ Mos 08/14/2018   Influenza-Unspecified 08/01/2014, 09/19/2015, 08/20/2016, 08/30/2017, 07/05/2019, 08/22/2020, 08/26/2021   PFIZER(Purple Top)SARS-COV-2 Vaccination 12/13/2019, 01/07/2020, 09/24/2020   Pfizer Covid-19 Vaccine Bivalent Booster 14yrs & up 11/10/2021   Pneumococcal Conjugate-13 01/13/2015   Pneumococcal Polysaccharide-23 12/24/2010   Td 12/28/2002, 01/02/2013   Zoster Recombinat (Shingrix) 09/14/2019, 07/03/2021   Zoster, Live 12/24/2010    TDAP status: Due, Education has been provided regarding the importance of this vaccine. Advised may receive this vaccine at local pharmacy or Health Dept. Aware to provide a copy of the vaccination record if obtained from local pharmacy or Health Dept. Verbalized acceptance and understanding.  Flu Vaccine status: Up to date  Pneumococcal vaccine status: Up to date  Covid-19 vaccine status: Completed vaccines  Qualifies for Shingles Vaccine? Yes   Zostavax completed Yes   Shingrix Completed?: Yes  Screening Tests Health Maintenance  Topic Date Due   OPHTHALMOLOGY EXAM  12/15/2021   COVID-19 Vaccine (5 - 2023-24 season) 07/09/2022   DTaP/Tdap/Td (3 - Tdap) 01/02/2023   MAMMOGRAM  03/13/2023   HEMOGLOBIN A1C  05/16/2023   Diabetic kidney evaluation - eGFR measurement  09/15/2023   Diabetic kidney evaluation - Urine ACR  11/16/2023   FOOT EXAM  11/16/2023   Medicare Annual Wellness (AWV)  02/03/2024   COLONOSCOPY (Pts 45-76yrs Insurance coverage will need to be confirmed)  10/18/2024   Pneumonia Vaccine 28+ Years old  Completed   INFLUENZA VACCINE  Completed    DEXA SCAN  Completed   Hepatitis C Screening  Completed   Zoster Vaccines- Shingrix  Completed   HPV VACCINES  Aged Out    Health Maintenance  Health Maintenance Due  Topic Date Due   OPHTHALMOLOGY EXAM  12/15/2021   COVID-19 Vaccine (5 -  2023-24 season) 07/09/2022   DTaP/Tdap/Td (3 - Tdap) 01/02/2023    Colorectal cancer screening: No longer required.   Mammogram status: No longer required due to age.  Bone Density status: Completed 03/01/2022. Results reflect: Bone density results: OSTEOPENIA. Repeat every 5 years.  Lung Cancer Screening: (Low Dose CT Chest recommended if Age 34-80 years, 30 pack-year currently smoking OR have quit w/in 15years.) does not qualify.   Lung Cancer Screening Referral: n/a  Additional Screening:  Hepatitis C Screening: does not qualify; Completed 07/15/2015  Vision Screening: Recommended annual ophthalmology exams for early detection of glaucoma and other disorders of the eye. Is the patient up to date with their annual eye exam?  Yes  Who is the provider or what is the name of the office in which the patient attends annual eye exams? Dr.Duncan  If pt is not established with a provider, would they like to be referred to a provider to establish care? No .   Dental Screening: Recommended annual dental exams for proper oral hygiene  Community Resource Referral / Chronic Care Management: CRR required this visit?  No   CCM required this visit?  No      Plan:     I have personally reviewed and noted the following in the patient's chart:   Medical and social history Use of alcohol, tobacco or illicit drugs  Current medications and supplements including opioid prescriptions. Patient is not currently taking opioid prescriptions. Functional ability and status Nutritional status Physical activity Advanced directives List of other physicians Hospitalizations, surgeries, and ER visits in previous 12 months Vitals Screenings to include  cognitive, depression, and falls Referrals and appointments  In addition, I have reviewed and discussed with patient certain preventive protocols, quality metrics, and best practice recommendations. A written personalized care plan for preventive services as well as general preventive health recommendations were provided to patient.     Daphane Shepherd, LPN   [id number]   Nurse Notes: Due Tdap Vaccine

## 2023-02-11 ENCOUNTER — Other Ambulatory Visit (INDEPENDENT_AMBULATORY_CARE_PROVIDER_SITE_OTHER): Payer: Medicare PPO

## 2023-02-11 DIAGNOSIS — E119 Type 2 diabetes mellitus without complications: Secondary | ICD-10-CM | POA: Diagnosis not present

## 2023-02-11 DIAGNOSIS — E210 Primary hyperparathyroidism: Secondary | ICD-10-CM

## 2023-02-11 LAB — LIPID PANEL
Cholesterol: 110 mg/dL (ref 0–200)
HDL: 35.8 mg/dL — ABNORMAL LOW (ref >39.00)
LDL Cholesterol: 62 mg/dL (ref 0–99)
NonHDL: 74.05
Total CHOL/HDL Ratio: 3
Triglycerides: 58 mg/dL (ref 0.0–149.0)
VLDL: 11.6 mg/dL (ref 0.0–40.0)

## 2023-02-11 LAB — TSH: TSH: 0.53 u[IU]/mL (ref 0.35–5.50)

## 2023-02-11 LAB — CBC WITH DIFFERENTIAL/PLATELET
Basophils Absolute: 0 10*3/uL (ref 0.0–0.1)
Basophils Relative: 1 % (ref 0.0–3.0)
Eosinophils Absolute: 0.1 10*3/uL (ref 0.0–0.7)
Eosinophils Relative: 3.3 % (ref 0.0–5.0)
HCT: 43.2 % (ref 36.0–46.0)
Hemoglobin: 14.8 g/dL (ref 12.0–15.0)
Lymphocytes Relative: 34.7 % (ref 12.0–46.0)
Lymphs Abs: 1.4 10*3/uL (ref 0.7–4.0)
MCHC: 34.1 g/dL (ref 30.0–36.0)
MCV: 91.4 fl (ref 78.0–100.0)
Monocytes Absolute: 0.4 10*3/uL (ref 0.1–1.0)
Monocytes Relative: 10.1 % (ref 3.0–12.0)
Neutro Abs: 2 10*3/uL (ref 1.4–7.7)
Neutrophils Relative %: 50.9 % (ref 43.0–77.0)
Platelets: 195 10*3/uL (ref 150.0–400.0)
RBC: 4.73 Mil/uL (ref 3.87–5.11)
RDW: 14.2 % (ref 11.5–15.5)
WBC: 4 10*3/uL (ref 4.0–10.5)

## 2023-02-11 LAB — COMPREHENSIVE METABOLIC PANEL
ALT: 17 U/L (ref 0–35)
AST: 15 U/L (ref 0–37)
Albumin: 4.2 g/dL (ref 3.5–5.2)
Alkaline Phosphatase: 88 U/L (ref 39–117)
BUN: 13 mg/dL (ref 6–23)
CO2: 30 mEq/L (ref 19–32)
Calcium: 10 mg/dL (ref 8.4–10.5)
Chloride: 101 mEq/L (ref 96–112)
Creatinine, Ser: 0.49 mg/dL (ref 0.40–1.20)
GFR: 90.92 mL/min (ref >60.00)
Glucose, Bld: 145 mg/dL — ABNORMAL HIGH (ref 70–99)
Potassium: 4.4 mEq/L (ref 3.5–5.1)
Sodium: 139 mEq/L (ref 135–145)
Total Bilirubin: 1.9 mg/dL — ABNORMAL HIGH (ref 0.2–1.2)
Total Protein: 6.2 g/dL (ref 6.0–8.3)

## 2023-02-11 LAB — HEMOGLOBIN A1C: Hgb A1c MFr Bld: 8.9 % — ABNORMAL HIGH (ref 4.6–6.5)

## 2023-02-11 LAB — VITAMIN D 25 HYDROXY (VIT D DEFICIENCY, FRACTURES): VITD: 47.94 ng/mL (ref 30.00–100.00)

## 2023-02-12 LAB — PTH, INTACT AND CALCIUM
Calcium: 10 mg/dL (ref 8.6–10.4)
PTH: 29 pg/mL (ref 16–77)

## 2023-02-18 ENCOUNTER — Ambulatory Visit (INDEPENDENT_AMBULATORY_CARE_PROVIDER_SITE_OTHER): Payer: Medicare PPO | Admitting: Family Medicine

## 2023-02-18 ENCOUNTER — Encounter: Payer: Self-pay | Admitting: Family Medicine

## 2023-02-18 VITALS — BP 120/78 | HR 73 | Temp 97.5°F | Ht 64.0 in | Wt 146.0 lb

## 2023-02-18 DIAGNOSIS — Z7189 Other specified counseling: Secondary | ICD-10-CM

## 2023-02-18 DIAGNOSIS — E785 Hyperlipidemia, unspecified: Secondary | ICD-10-CM | POA: Diagnosis not present

## 2023-02-18 DIAGNOSIS — E119 Type 2 diabetes mellitus without complications: Secondary | ICD-10-CM | POA: Diagnosis not present

## 2023-02-18 DIAGNOSIS — E210 Primary hyperparathyroidism: Secondary | ICD-10-CM | POA: Diagnosis not present

## 2023-02-18 DIAGNOSIS — R011 Cardiac murmur, unspecified: Secondary | ICD-10-CM

## 2023-02-18 DIAGNOSIS — I10 Essential (primary) hypertension: Secondary | ICD-10-CM

## 2023-02-18 DIAGNOSIS — Z0000 Encounter for general adult medical examination without abnormal findings: Secondary | ICD-10-CM

## 2023-02-18 MED ORDER — METFORMIN HCL 500 MG PO TABS: ORAL_TABLET | ORAL | Status: DC

## 2023-02-18 NOTE — Progress Notes (Unsigned)
Diabetes:  Using medications without difficulties: see below.   Hypoglycemic episodes: no Hyperglycemic episodes: no Feet problems: no Blood Sugars averaging: usually ~130s in the AMs.  eye exam within last year 12/17/2022.   Still on metformin, with inc in gas over the last few months.  No change in diet.  No bloody or black stools.  No abd pain.   Labs d/w pt.   She had injection in L shoulder, that likely affected her A1c.  Her L shoulder is better.    Hypertension:    Using medication without problems or lightheadedness: yes Chest pain with exertion:no Edema:no Short of breath:no Labs d/w pt.  No NTG use.    Elevated Cholesterol: Using medications without problems:yes Muscle aches: no Diet compliance: yes Exercise: yes  Hypercalcemia hx noted. Calcium and PTH wnl.  Swallowing well.  Neck incision healed.    Vaccines discussed with patient. Mammogram - she'll schedule for 2024.   Colonoscopy 2020 DXA 2023.  Diet and exercise d/w pt.   Advance directive d/w pt- husband designated if patient were incapacitated.    H/o AS with echo pending for 08/2023 per cards.    PMH and SH reviewed  Meds, vitals, and allergies reviewed.   ROS: Per HPI unless specifically indicated in ROS section   GEN: nad, alert and oriented HEENT: ncat NECK: supple w/o LA CV: rrr. SEM noted.  PULM: ctab, no inc wob ABD: soft, +bs EXT: no edema SKIN: no acute rash  Diabetic foot exam: Normal inspection No skin breakdown No calluses  Normal DP pulses Normal sensation to light touch and monofilament Nails normal

## 2023-02-18 NOTE — Patient Instructions (Addendum)
Decrease metformin to 1/2 tab (500mg ) twice a day.  See if you feel better.   If needed, add 1 unit of insulin per day when your AM sugar is 151 or higher.  Goal AM sugar 120-150.   Recheck in about 3 months.  A1c at the visit.   Take care.  Glad to see you.

## 2023-02-20 DIAGNOSIS — E785 Hyperlipidemia, unspecified: Secondary | ICD-10-CM | POA: Insufficient documentation

## 2023-02-20 NOTE — Assessment & Plan Note (Signed)
Vaccines discussed with patient. Mammogram - she'll schedule for 2024.   Colonoscopy 2020 DXA 2023.  Diet and exercise d/w pt.   Advance directive d/w pt- husband designated if patient were incapacitated.

## 2023-02-20 NOTE — Assessment & Plan Note (Signed)
Continue work on diet and exercise.  Continue insulin but decrease metformin to 500 mg twice a day to see if she has less GI symptoms.  She may need to increase her insulin daily by 1 unit/day if her a.m. sugars above 150.  See after visit summary.  Recheck periodically.  Update me as needed.

## 2023-02-20 NOTE — Assessment & Plan Note (Signed)
H/o AS with echo pending for 08/2023 per cards.

## 2023-02-20 NOTE — Assessment & Plan Note (Signed)
Continue lisinopril.  Continue work on diet and exercise. °

## 2023-02-20 NOTE — Assessment & Plan Note (Signed)
Continue atorvastatin.  Labs discussed with patient. 

## 2023-02-20 NOTE — Assessment & Plan Note (Signed)
H/o hyperparathyroidism. Hypercalcemia hx noted. Calcium and PTH wnl.  Swallowing well.  Neck incision healed.

## 2023-02-20 NOTE — Assessment & Plan Note (Signed)
Advance directive d/w pt- husband designated if patient were incapacitated.   

## 2023-02-28 ENCOUNTER — Telehealth: Payer: Self-pay | Admitting: Family Medicine

## 2023-02-28 DIAGNOSIS — Z1231 Encounter for screening mammogram for malignant neoplasm of breast: Secondary | ICD-10-CM

## 2023-02-28 NOTE — Telephone Encounter (Signed)
Patient contacted the office stating she received a call from [facility] in [place], saying they needed orders from her pcp to do her mammogram on 4/29. Wants to know if these can be faxed over to them. Please advise, thank you.

## 2023-02-28 NOTE — Telephone Encounter (Signed)
Forgot to add in fax #, apologies.  [phone]

## 2023-03-02 NOTE — Telephone Encounter (Signed)
Ordered, please let me know if this will not suffice.

## 2023-03-02 NOTE — Telephone Encounter (Signed)
Order has been faxed to [facility] and left a message on patients VM that they were faxed.

## 2023-03-04 ENCOUNTER — Other Ambulatory Visit: Payer: Self-pay | Admitting: Family Medicine

## 2023-03-07 DIAGNOSIS — Z1231 Encounter for screening mammogram for malignant neoplasm of breast: Secondary | ICD-10-CM | POA: Diagnosis not present

## 2023-03-07 LAB — HM MAMMOGRAPHY

## 2023-03-10 DIAGNOSIS — R922 Inconclusive mammogram: Secondary | ICD-10-CM | POA: Diagnosis not present

## 2023-03-10 LAB — HM MAMMOGRAPHY

## 2023-03-14 ENCOUNTER — Encounter (HOSPITAL_BASED_OUTPATIENT_CLINIC_OR_DEPARTMENT_OTHER): Payer: Self-pay | Admitting: Cardiology

## 2023-03-16 ENCOUNTER — Telehealth: Payer: Self-pay | Admitting: Cardiology

## 2023-03-16 NOTE — Telephone Encounter (Signed)
   [facility] Pre-operative Risk Assessment    Request for surgical clearance:  What type of surgery is being performed?  Extraction of tooth #10   When is this surgery scheduled?  03/21/23   What type of clearance is required (medical clearance vs. Pharmacy clearance to hold med vs. Both)?  Both   Are there any medications that need to be held prior to surgery and how long?  Please advise on Plavix  Practice name and name of physician performing surgery?  [facility]  Dr. Ascencion Dike    What is your office phone number? [phone]    7.   What is your office fax number? [phone]  8.   Anesthesia type (None, local, MAC, general)?  Local    Gloria [name] 03/16/2023, 12:19 PM

## 2023-03-16 NOTE — Telephone Encounter (Signed)
   Patient Name: Gloria [name]  DOB: [birth year] MRN: [id number]  Primary Cardiologist: Armanda Magic, MD  Chart reviewed as part of pre-operative protocol coverage.   Dental extractions of 1-2 teeth are considered low risk procedures per guidelines and generally do not require any specific cardiac clearance. It is also generally accepted that for extractions of 1-2 teeth and dental cleanings, there is no need to interrupt blood thinner therapy.  SBE prophylaxis is not required for the patient from a cardiac standpoint.  I will route this recommendation to the requesting party via Epic fax function and remove from pre-op pool.  Please call with questions.  Carlos Levering, NP 03/16/2023, 3:38 PM

## 2023-03-17 ENCOUNTER — Telehealth: Payer: Self-pay | Admitting: Cardiology

## 2023-03-17 NOTE — Telephone Encounter (Signed)
Follow Up:       Sherron Monday is calling to find out when patient need to stop her  Clopidogrel please. ASHe needs to know today please, surgery is Monday(03-21-23) and their office is closed tomorrow. Please call toda before 2:30 PM.

## 2023-03-17 NOTE — Telephone Encounter (Signed)
S/w Hillary at dental office and sent via epic dental office clearance @ [phone].

## 2023-03-23 DIAGNOSIS — L821 Other seborrheic keratosis: Secondary | ICD-10-CM | POA: Diagnosis not present

## 2023-03-23 DIAGNOSIS — D225 Melanocytic nevi of trunk: Secondary | ICD-10-CM | POA: Diagnosis not present

## 2023-03-23 DIAGNOSIS — D2261 Melanocytic nevi of right upper limb, including shoulder: Secondary | ICD-10-CM | POA: Diagnosis not present

## 2023-03-23 DIAGNOSIS — Z85828 Personal history of other malignant neoplasm of skin: Secondary | ICD-10-CM | POA: Diagnosis not present

## 2023-03-23 DIAGNOSIS — D2262 Melanocytic nevi of left upper limb, including shoulder: Secondary | ICD-10-CM | POA: Diagnosis not present

## 2023-03-23 DIAGNOSIS — D2272 Melanocytic nevi of left lower limb, including hip: Secondary | ICD-10-CM | POA: Diagnosis not present

## 2023-03-23 DIAGNOSIS — Z08 Encounter for follow-up examination after completed treatment for malignant neoplasm: Secondary | ICD-10-CM | POA: Diagnosis not present

## 2023-03-23 DIAGNOSIS — L728 Other follicular cysts of the skin and subcutaneous tissue: Secondary | ICD-10-CM | POA: Diagnosis not present

## 2023-03-23 DIAGNOSIS — D2271 Melanocytic nevi of right lower limb, including hip: Secondary | ICD-10-CM | POA: Diagnosis not present

## 2023-04-19 ENCOUNTER — Other Ambulatory Visit: Payer: Self-pay | Admitting: Cardiology

## 2023-05-19 DIAGNOSIS — D485 Neoplasm of uncertain behavior of skin: Secondary | ICD-10-CM | POA: Diagnosis not present

## 2023-05-19 DIAGNOSIS — C44629 Squamous cell carcinoma of skin of left upper limb, including shoulder: Secondary | ICD-10-CM | POA: Diagnosis not present

## 2023-05-20 ENCOUNTER — Encounter: Payer: Self-pay | Admitting: Family Medicine

## 2023-05-20 ENCOUNTER — Ambulatory Visit: Payer: Medicare PPO | Admitting: Family Medicine

## 2023-05-20 VITALS — BP 122/70 | HR 73 | Temp 97.8°F | Ht 64.0 in | Wt 148.0 lb

## 2023-05-20 DIAGNOSIS — Z7984 Long term (current) use of oral hypoglycemic drugs: Secondary | ICD-10-CM | POA: Diagnosis not present

## 2023-05-20 DIAGNOSIS — E119 Type 2 diabetes mellitus without complications: Secondary | ICD-10-CM | POA: Diagnosis not present

## 2023-05-20 LAB — POCT GLYCOSYLATED HEMOGLOBIN (HGB A1C): Hemoglobin A1C: 8.1 % — AB (ref 4.0–5.6)

## 2023-05-20 MED ORDER — LANTUS SOLOSTAR 100 UNIT/ML ~~LOC~~ SOPN: 39.0000 [IU] | PEN_INJECTOR | Freq: Every day | SUBCUTANEOUS | Status: DC

## 2023-05-20 MED ORDER — METFORMIN HCL 500 MG PO TABS: ORAL_TABLET | ORAL | 3 refills | Status: DC

## 2023-05-20 NOTE — Patient Instructions (Addendum)
Recheck A1c at a visit in about 4 months- November.   I would get a flu shot each fall.   Update me as needed.  Take care.  Glad to see you.

## 2023-05-20 NOTE — Progress Notes (Unsigned)
Diabetes:  Using medications without difficulties: Hypoglycemic episodes: no, cautions d/w pt.  Hyperglycemic episodes: no Feet problems: not usually, some rare burning/pain in the feet Blood Sugars averaging: usually 120-150.   eye exam within last year: yes A1c improved to 8.1.  she is working on diet and walking for exercise.   Her sugar was down to 136 last night prior to bed and took a snack- cautions d/w pt.  Goal to limit low sugars.   Goal A1c ~8, d/w pt.   She is taking metformin 500mg  AM and at lunch.  Still with sig gas, changing probiotic helped.    She had L shoulder injected with relief.    Biopsy done on L arm lesion per derm yesterday.    D/w pt about Tdap, then flu, then RSV vaccine.    She broke a tooth recently.  She is going to have an implant in the near future.    Meds, vitals, and allergies reviewed.   ROS: Per HPI unless specifically indicated in ROS section   GEN: nad, alert and oriented HEENT: ncat NECK: supple w/o LA CV: rrr. SEM noted at baseline.  PULM: ctab, no inc wob ABD: soft, +bs EXT: no edema SKIN: no acute rash Bandaid on L arm.    Diabetic foot exam: Normal inspection No skin breakdown No calluses  Normal DP pulses Normal sensation to light touch and monofilament Nails normal

## 2023-05-21 NOTE — Assessment & Plan Note (Signed)
A1c improved to 8.1.  she is working on diet and walking for exercise.   Her sugar was down to 136 last night prior to bed and took a snack- cautions d/w pt.  Goal to limit low sugars.   Goal A1c ~8, d/w pt.   She is taking metformin 500mg  AM and at lunch.  Prev with sig gas, changing probiotic helped.  She can tolerate as is. Continue metformin and insulin as is.  Recheck periodically.

## 2023-06-21 DIAGNOSIS — Z4689 Encounter for fitting and adjustment of other specified devices: Secondary | ICD-10-CM | POA: Diagnosis not present

## 2023-06-21 DIAGNOSIS — N993 Prolapse of vaginal vault after hysterectomy: Secondary | ICD-10-CM | POA: Diagnosis not present

## 2023-06-21 DIAGNOSIS — N952 Postmenopausal atrophic vaginitis: Secondary | ICD-10-CM | POA: Diagnosis not present

## 2023-07-04 ENCOUNTER — Other Ambulatory Visit: Payer: Self-pay | Admitting: Family Medicine

## 2023-07-27 DIAGNOSIS — C44629 Squamous cell carcinoma of skin of left upper limb, including shoulder: Secondary | ICD-10-CM | POA: Diagnosis not present

## 2023-07-27 DIAGNOSIS — D2362 Other benign neoplasm of skin of left upper limb, including shoulder: Secondary | ICD-10-CM | POA: Diagnosis not present

## 2023-08-23 ENCOUNTER — Ambulatory Visit (HOSPITAL_COMMUNITY): Payer: Medicare PPO | Attending: Physician Assistant

## 2023-08-23 DIAGNOSIS — I350 Nonrheumatic aortic (valve) stenosis: Secondary | ICD-10-CM | POA: Insufficient documentation

## 2023-08-23 LAB — ECHOCARDIOGRAM COMPLETE
AR max vel: 1.04 cm2
AV Area VTI: 1.11 cm2
AV Area mean vel: 1.02 cm2
AV Mean grad: 34 mm[Hg]
AV Peak grad: 54.8 mm[Hg]
Ao pk vel: 3.7 m/s
Area-P 1/2: 2.79 cm2
MV VTI: 2.25 cm2
P 1/2 time: 617 ms
S' Lateral: 2.9 cm

## 2023-09-19 ENCOUNTER — Other Ambulatory Visit: Payer: Self-pay | Admitting: Family Medicine

## 2023-09-22 ENCOUNTER — Telehealth: Payer: Self-pay

## 2023-09-22 ENCOUNTER — Ambulatory Visit: Payer: Medicare PPO | Admitting: Family Medicine

## 2023-09-22 ENCOUNTER — Encounter: Payer: Self-pay | Admitting: Family Medicine

## 2023-09-22 VITALS — BP 122/68 | HR 71 | Temp 98.4°F | Ht 64.0 in | Wt 153.6 lb

## 2023-09-22 DIAGNOSIS — Z794 Long term (current) use of insulin: Secondary | ICD-10-CM

## 2023-09-22 DIAGNOSIS — E119 Type 2 diabetes mellitus without complications: Secondary | ICD-10-CM | POA: Diagnosis not present

## 2023-09-22 DIAGNOSIS — M19012 Primary osteoarthritis, left shoulder: Secondary | ICD-10-CM | POA: Diagnosis not present

## 2023-09-22 LAB — POCT GLYCOSYLATED HEMOGLOBIN (HGB A1C): Hemoglobin A1C: 7.5 % — AB (ref 4.0–5.6)

## 2023-09-22 MED ORDER — LANTUS SOLOSTAR 100 UNIT/ML ~~LOC~~ SOPN: 38.0000 [IU] | PEN_INJECTOR | Freq: Every day | SUBCUTANEOUS | 3 refills | Status: DC

## 2023-09-22 MED ORDER — LANTUS SOLOSTAR 100 UNIT/ML ~~LOC~~ SOPN: 38.0000 [IU] | PEN_INJECTOR | Freq: Every day | SUBCUTANEOUS | Status: DC

## 2023-09-22 NOTE — Telephone Encounter (Signed)
-----   Message from Crawford Givens sent at 09/22/2023  8:38 AM EST ----- She had RSV at Tetanus at [facility].   Please request the date and update health maintenance.

## 2023-09-22 NOTE — Patient Instructions (Addendum)
Please ask ortho about your hand.  If needed, add 1 unit per day of insulin after shoulder injection.   Recheck in at a yearly visit in about 4 months.  Labs ahead of time if possible   Take care.  Glad to see you.

## 2023-09-22 NOTE — Telephone Encounter (Signed)
Received fax from [facility].  Boostrix was already charted for 05/31/23.  Added RSV for 08/02/23

## 2023-09-22 NOTE — Progress Notes (Signed)
Diabetes:  Using medications without difficulties: yes Hypoglycemic episodes: no episodes but she is careful to limit nocturnal hypoglycemia.   Hyperglycemic episodes:no Feet problems: she has some yellow jackets stings on the L foot.  She has some swelling from that.   Blood Sugars averaging: usually ~ 120-130s in the AMs A1c 7.5.   Taking 38-40 units insulin, depending on her sugar.  She had RSV at Tetanus vaccines at [facility].    She has IP changes from arthrits and had to get her wedding ring resized.  She has L 4th PIP triggering.  D/w pt about options, ie ortho f/u.  She has repeat L shoulder injection pending.    Meds, vitals, and allergies reviewed.   ROS: Per HPI unless specifically indicated in ROS section   GEN: nad, alert and oriented HEENT: ncat NECK: supple w/o LA CV: rrr. SEM noted.   PULM: ctab, no inc wob ABD: soft, +bs EXT: trace L foot edema SKIN: well perfused

## 2023-09-23 ENCOUNTER — Encounter: Payer: Self-pay | Admitting: Family Medicine

## 2023-09-23 ENCOUNTER — Telehealth: Payer: Self-pay | Admitting: Family Medicine

## 2023-09-23 MED ORDER — LANTUS SOLOSTAR 100 UNIT/ML ~~LOC~~ SOPN: 38.0000 [IU] | PEN_INJECTOR | Freq: Every day | SUBCUTANEOUS | 3 refills | Status: DC

## 2023-09-23 NOTE — Telephone Encounter (Signed)
Pharmacy contacted the office regarding medication insulin glargine (LANTUS SOLOSTAR) 100 UNIT/ML Solostar Pen , states that patient discussed with Dr. Para March that she will be having a steroid injection and due to that, Dr. Para March wanted her to have enough insulin to keep her blood sugars under control. States that patient was told that she will be getting 3 boxes of this medication. Caller states that with the current dosage and directions with this medication, patient will only be getting 2 boxes. Pharmacy requesting a new rx to be sent over with directions of inject 50 units into the skin daily. Please advise pharmacy if needed

## 2023-09-23 NOTE — Telephone Encounter (Signed)
Sent a new rx with 50 units.

## 2023-09-23 NOTE — Telephone Encounter (Signed)
Pharmacy called again, states that due to the directions on the medication which states "Inject 38-40 Units into the skin daily", insurance will not pay for this. Pharmacy is needing the directions to be changed for the maximum amount to be 38-50 units. This way, the amount will equal out to 90 days and insurance will cover this. Please advise

## 2023-09-25 ENCOUNTER — Other Ambulatory Visit: Payer: Self-pay | Admitting: Family Medicine

## 2023-09-25 MED ORDER — LANTUS SOLOSTAR 100 UNIT/ML ~~LOC~~ SOPN: 50.0000 [IU] | PEN_INJECTOR | Freq: Every day | SUBCUTANEOUS | 3 refills | Status: DC

## 2023-09-25 NOTE — Assessment & Plan Note (Signed)
A1c 7.5.   Taking 38-40 units insulin, depending on her sugar. If needed, add 1 unit per day of insulin after shoulder injection.   Recheck in at a yearly visit in about 4 months.  Labs ahead of time if possible

## 2023-09-25 NOTE — Assessment & Plan Note (Signed)
She is going to follow-up with orthopedics.  She can asked them about potential trigger finger injections.

## 2023-10-03 ENCOUNTER — Other Ambulatory Visit: Payer: Self-pay | Admitting: Family Medicine

## 2023-10-10 ENCOUNTER — Other Ambulatory Visit: Payer: Self-pay | Admitting: Family Medicine

## 2023-10-12 DIAGNOSIS — Z96611 Presence of right artificial shoulder joint: Secondary | ICD-10-CM | POA: Diagnosis not present

## 2023-10-12 DIAGNOSIS — M19012 Primary osteoarthritis, left shoulder: Secondary | ICD-10-CM | POA: Diagnosis not present

## 2023-10-24 ENCOUNTER — Other Ambulatory Visit: Payer: Self-pay | Admitting: Cardiology

## 2023-11-15 ENCOUNTER — Ambulatory Visit: Payer: Medicare PPO | Admitting: Physician Assistant

## 2023-12-12 DIAGNOSIS — H524 Presbyopia: Secondary | ICD-10-CM | POA: Diagnosis not present

## 2023-12-12 DIAGNOSIS — H5213 Myopia, bilateral: Secondary | ICD-10-CM | POA: Diagnosis not present

## 2023-12-12 DIAGNOSIS — H2513 Age-related nuclear cataract, bilateral: Secondary | ICD-10-CM | POA: Diagnosis not present

## 2023-12-12 DIAGNOSIS — E119 Type 2 diabetes mellitus without complications: Secondary | ICD-10-CM | POA: Diagnosis not present

## 2023-12-12 DIAGNOSIS — H52203 Unspecified astigmatism, bilateral: Secondary | ICD-10-CM | POA: Diagnosis not present

## 2023-12-12 LAB — HM DIABETES EYE EXAM

## 2024-01-16 ENCOUNTER — Other Ambulatory Visit: Payer: Self-pay | Admitting: Cardiology

## 2024-01-23 ENCOUNTER — Ambulatory Visit: Payer: Medicare PPO | Admitting: Physician Assistant

## 2024-01-27 ENCOUNTER — Telehealth: Payer: Self-pay

## 2024-01-27 ENCOUNTER — Encounter

## 2024-01-27 NOTE — Telephone Encounter (Signed)
 Copied from CRM [id number]. Topic: General - Other >> Jan 27, 2024  1:48 PM Aletta Edouard wrote: Reason for CRM: patient had a telephone visit a wellness visit and stated that she waited around for a phone call and didn't get one yet she has another appt to get to

## 2024-02-06 ENCOUNTER — Ambulatory Visit: Payer: Medicare PPO | Attending: Cardiology | Admitting: Cardiology

## 2024-02-06 ENCOUNTER — Encounter: Payer: Self-pay | Admitting: Cardiology

## 2024-02-06 VITALS — BP 128/78 | HR 85 | Resp 16 | Ht 64.0 in | Wt 153.2 lb

## 2024-02-06 DIAGNOSIS — I2510 Atherosclerotic heart disease of native coronary artery without angina pectoris: Secondary | ICD-10-CM | POA: Diagnosis not present

## 2024-02-06 DIAGNOSIS — I350 Nonrheumatic aortic (valve) stenosis: Secondary | ICD-10-CM

## 2024-02-06 DIAGNOSIS — E785 Hyperlipidemia, unspecified: Secondary | ICD-10-CM

## 2024-02-06 DIAGNOSIS — I10 Essential (primary) hypertension: Secondary | ICD-10-CM | POA: Diagnosis not present

## 2024-02-06 DIAGNOSIS — E119 Type 2 diabetes mellitus without complications: Secondary | ICD-10-CM

## 2024-02-06 NOTE — Patient Instructions (Signed)
 Medication Instructions:  Your physician recommends that you continue on your current medications as directed. Please refer to the Current Medication list given to you today.  *If you need a refill on your cardiac medications before your next appointment, please call your pharmacy*  Lab Work: NONE If you have labs (blood work) drawn today and your tests are completely normal, you will receive your results only by: MyChart Message (if you have MyChart) OR A paper copy in the mail If you have any lab test that is abnormal or we need to change your treatment, we will call you to review the results.  Testing/Procedures: Echocardiogram in October  Follow-Up: At [facility], you and your health needs are our priority.  As part of our continuing mission to provide you with exceptional heart care, our providers are all part of one team.  This team includes your primary Cardiologist (physician) and Advanced Practice Providers or APPs (Physician Assistants and Nurse Practitioners) who all work together to provide you with the care you need, when you need it.  Your next appointment:   1 year(s)  Provider:   Armanda Magic, MD     We recommend signing up for the patient portal called "MyChart".  Sign up information is provided on this After Visit Summary.  MyChart is used to connect with patients for Virtual Visits (Telemedicine).  Patients are able to view lab/test results, encounter notes, upcoming appointments, etc.  Non-urgent messages can be sent to your provider as well.   To learn more about what you can do with MyChart, go to [email or web address].   Other Instructions       1st Floor: - Lobby - Registration  - Pharmacy  - Lab - Cafe  2nd Floor: - PV Lab - Diagnostic Testing (echo, CT, nuclear med)  3rd Floor: - Vacant  4th Floor: - TCTS (cardiothoracic surgery) - AFib Clinic - Structural Heart Clinic - Vascular Surgery  - Vascular Ultrasound  5th Floor: -  [facility] (general and EP) - Clinical Pharmacy for coumadin, hypertension, lipid, weight-loss medications, and med management appointments    Valet parking services will be available as well.

## 2024-02-06 NOTE — Progress Notes (Addendum)
 Cardiology Note    Date:  02/06/2024   ID:  Gloria [name], DOB 1946-10-01, MRN [id number]  PCP:  Joaquim Nam, MD  Cardiologist:  Armanda Magic, MD   Chief Complaint  Patient presents with   Coronary Artery Disease   Hypertension   Hyperlipidemia    History of Present Illness:  Gloria [name] is a 78 y.o. female with a hx of DM2, heart murmur with normal echo in 2011 and HTN.  She also has ASAD with coronary CTA showing a markedly elevated Coronary Ca score of 6179 and severe heavily calcified multivessel CAD.  She underwent cardiac cath showing 50% prox to mid LAD, 40% mLCx, 80% OM3, 95% prox to mid RCA and underwent PCI of the RCA 02/2020.   She is here today for followup and is doing well.  She denies any chest pain or pressure, SOB, DOE (except climbing 3 flights of stairs), PND, orthopnea, dizziness, palpitations or syncope. Occasionally she will have some mild LE edema if she stands for too long. She is compliant with her meds and is tolerating meds with no SE.    Past Medical History:  Diagnosis Date   ABDOMINAL PAIN OTHER SPECIFIED SITE 08/27/2010   Qualifier: Diagnosis of  By: Para March MD, Cheree Ditto     Advance directive discussed with patient 01/03/2012   12/2011-AD d/w pt.  She has talked to husband but doesn't have living will yet.  Full code in discussion with patient today.  Would not want prolonged interventions if she were profoundly and/or permanently impaired, ie severe dementia or a condition with no hope of improvement.   01/13/15-Advance directive d/w pt- husband designated if patient were incapacitated.     Allergic rhinitis    Aortic stenosis    Moderate by echo 08/2022   Arthritis    Asthma    as a chld   Complication of anesthesia    Coronary artery disease    Diabetes mellitus without complication (HCC) 05/24/2007   Qualifier: Diagnosis of  By: Hetty Ely MD, Franne Grip    Disorder of bone and cartilage 12/20/2007   2016 DXA: -1.4.  Consider repeat in 2021.       Female cystocele 08/10/2012   Health care maintenance 02/09/2018   Heart murmur    Hemorrhoids    History of kidney stones    Hypertension    Left shoulder pain 07/20/2016   Medicare annual wellness visit, subsequent 01/03/2012   MENOPAUSE, SURGICAL 12/16/2009   Qualifier: Diagnosis of  By: Pasty Arch LPN, Regina     PONV (postoperative nausea and vomiting)    Pure hypercholesterolemia 03/30/2011   RHINITIS 12/24/2010   Qualifier: Diagnosis of  By: Para March MD, Cheree Ditto     Sleep apnea    wears mouthpiece   Snoring 01/02/2020    Past Surgical History:  Procedure Laterality Date   ABDOMINAL HYSTERECTOMY     BASAL CELL CARCINOMA EXCISION  08/23/2017   left upper arm   CARPAL TUNNEL RELEASE     with bilateral releases   CORONARY ATHERECTOMY N/A 02/26/2020   Procedure: CORONARY ATHERECTOMY;  Surgeon: Tonny Bollman, MD;  Location: [facility] INVASIVE CV LAB;  Service: Cardiovascular;  Laterality: N/A;   CYSTECTOMY     Left breast, right lumpectomy B9   INGUINAL HERNIA REPAIR     Right (Dr. Maple Hudson)   LEFT HEART CATH AND CORONARY ANGIOGRAPHY N/A 02/22/2020   Procedure: LEFT HEART CATH AND CORONARY ANGIOGRAPHY;  Surgeon: Tonny Bollman, MD;  Location: [facility]  INVASIVE CV LAB;  Service: Cardiovascular;  Laterality: N/A;   NSVD     X 2   PARATHYROIDECTOMY Right 09/23/2022   Procedure: RIGHT INFERIOR PARATHYROIDECTOMY AND RIGHT SUPERIOR PARATHYROID BIOPSY;  Surgeon: Darnell Level, MD;  Location: [facility] ORS;  Service: General;  Laterality: Right;   REVERSE SHOULDER ARTHROPLASTY Right 12/18/2021   Procedure: REVERSE SHOULDER ARTHROPLASTY;  Surgeon: Yolonda Kida, MD;  Location: [facility] ORS;  Service: Orthopedics;  Laterality: Right;  150   TVH with vaginal prolapse  08/29/09   Transvag Tape w/Varitensor, Ant & Post Colporraphy, Uterosacral Lig susp, McCall Culdoplasty   ueteroscopy  11/01   without laser secondary stone   Urological procedure  10/22 - 08/31/09   [facility]    Current Medications: Current  Meds  Medication Sig   ACCU-CHEK AVIVA PLUS test strip CHECK BLOOD SUGAR FOUR TIMES DAILY AS NEEDED   ACCU-CHEK FASTCLIX LANCETS MISC Use to check blood sugar 4 times daily as needed.  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   aspirin EC 81 MG tablet Take 1 tablet (81 mg total) by mouth daily.   atorvastatin (LIPITOR) 20 MG tablet TAKE 1 TABLET BY MOUTH DAILY   B-D ULTRAFINE III SHORT PEN 31G X 8 MM MISC USE AS DIRECTED   Blood Glucose Monitoring Suppl (ACCU-CHEK AVIVA PLUS) W/DEVICE KIT Use to check blood sugar 4 times daily as needed.  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   clopidogrel (PLAVIX) 75 MG tablet Take 1 tablet (75 mg total) by mouth daily.   conjugated estrogens (PREMARIN) vaginal cream Place 1 applicator vaginally every 14 (fourteen) days.   insulin glargine (LANTUS SOLOSTAR) 100 UNIT/ML Solostar Pen Inject 50 Units into the skin daily. (Patient taking differently: Inject 35 Units into the skin daily.)   lisinopril (ZESTRIL) 20 MG tablet TAKE ONE TABLET BY MOUTH EVERY DAY   Magnesium Glycinate 100 MG CAPS Take 100 mg by mouth daily. Before bedtime   metFORMIN (GLUCOPHAGE) 500 MG tablet 500mg  twice a day   nitroGLYCERIN (NITROSTAT) 0.4 MG SL tablet Place 1 tablet (0.4 mg total) under the tongue every 5 (five) minutes as needed.   Omega-3 Fatty Acids (FISH OIL PO) Take 1 capsule by mouth daily.   Psyllium (METAMUCIL PO) Take 1 Scoop by mouth in the morning.   vitamin C (ASCORBIC ACID) 500 MG tablet Take 500 mg by mouth daily.    Allergies:   Sulfonamide derivatives, Ciprofloxin hcl [ciprofloxacin], and Penicillins   Social History   Socioeconomic History   Marital status: Married    Spouse name: Not on file   Number of children: 2   Years of education: Not on file   Highest education level: Not on file  Occupational History   Occupation: Retired Runner, broadcasting/film/video (primary education) from the  [employer]    Employer: RETIRED  Tobacco Use   Smoking status: Never   Smokeless tobacco: Never  Vaping Use   Vaping status: Never Used  Substance and Sexual Activity   Alcohol use: Yes    Alcohol/week: 2.0 - 3.0 standard drinks of alcohol    Types: 2 - 3 Glasses of wine per week    Comment: occas.   Drug use: No   Sexual activity: Not Currently  Other Topics Concern   Not on file  Social History Narrative   Retired Runner, broadcasting/film/video   Marital Status: Married, 1968   Children: 2 children, 2  grandkids.    Social Drivers of Corporate investment banker Strain: Low Risk  (02/03/2023)   Overall Financial Resource Strain (CARDIA)    Difficulty of Paying Living Expenses: Not hard at all  Food Insecurity: No Food Insecurity (02/03/2023)   Hunger Vital Sign    Worried About Running Out of Food in the Last Year: Never true    Ran Out of Food in the Last Year: Never true  Transportation Needs: No Transportation Needs (02/03/2023)   PRAPARE - Administrator, Civil Service (Medical): No    Lack of Transportation (Non-Medical): No  Physical Activity: Insufficiently Active (02/03/2023)   Exercise Vital Sign    Days of Exercise per Week: 4 days    Minutes of Exercise per Session: 30 min  Stress: No Stress Concern Present (02/03/2023)   [employer] of Occupational Health - Occupational Stress Questionnaire    Feeling of Stress : Not at all  Social Connections: Moderately Integrated (02/03/2023)   Social Connection and Isolation Panel [NHANES]    Frequency of Communication with Friends and Family: More than three times a week    Frequency of Social Gatherings with Friends and Family: More than three times a week    Attends Religious Services: Never    Database administrator or Organizations: Yes    Attends Engineer, structural: More than 4 times per year    Marital Status: Married     Family History:  The patient's family history includes Alcohol abuse in  her cousin; Breast cancer in her cousin; Diabetes (age of onset: 27) in her paternal aunt; Heart disease in her mother, paternal uncle, and paternal uncle; Hypertension in her mother; Prostate cancer in her son; Stroke in her maternal aunt and paternal aunt; Thyroid disease in an other family member.   ROS:   Please see the history of present illness.    ROS All other systems reviewed and are negative.      No data to display          Cardiac Studies Reviewed:  Cardiac Cath 02/2020 Conclusion    Prox LAD to Mid LAD lesion is 50% stenosed. Mid Cx lesion is 40% stenosed. 3rd Mrg lesion is 80% stenosed. Prox RCA to Mid RCA lesion is 95% stenosed. A drug-eluting stent was successfully placed using a STENT RESOLUTE ONYX 3.5X12. Post intervention, there is a 0% residual stenosis.   Successful atherectomy and stenting of severe calcific stenosis of the mid right coronary artery, reducing the lesion from 95% to 0% with TIMI-3 flow both pre and post procedure.  2D echo 03/2020 IMPRESSIONS    1. Normal LV systolic function; grade 1 diastolic dysfunction; mild LVH;  mildly dilated ascending aorta; moderate AS (mean gradient 25 mmHg);  moderate LAE.   2. Left ventricular ejection fraction, by estimation, is 60 to 65%. The  left ventricle has normal function. The left ventricle has no regional  wall motion abnormalities. There is mild left ventricular hypertrophy.  Left ventricular diastolic parameters  are consistent with Grade I diastolic dysfunction (impaired relaxation).   3. Right ventricular systolic function is normal. The right ventricular  size is normal. There is mildly elevated pulmonary artery systolic  pressure.   4. Left atrial size was moderately dilated.   5. The mitral valve is normal in structure. Trivial mitral valve  regurgitation. No evidence of mitral stenosis.   6. The aortic valve has an indeterminant number of cusps. Aortic valve  regurgitation  is not  visualized. Moderate aortic valve stenosis.   7. The inferior vena cava is normal in size with greater than 50%  respiratory variability, suggesting right atrial pressure of 3 mmHg.     PHYSICAL EXAM:   VS:  BP 128/78 (BP Location: Left Arm, Patient Position: Sitting, Cuff Size: Normal)   Pulse 85   Resp 16   Ht 5\' 4"  (1.626 m)   Wt 153 lb 3.2 oz (69.5 kg)   LMP 08/08/2009   SpO2 97%   BMI 26.30 kg/m     GEN: Well nourished, well developed in no acute distress HEENT: Normal NECK: No JVD; bilateral bruits that come from AS murmur LYMPHATICS: No lymphadenopathy CARDIAC:RRR, no rubs, gallops 2/6 mid peaking SM at RUSB to LUSB and into carotids RESPIRATORY:  Clear to auscultation without rales, wheezing or rhonchi  ABDOMEN: Soft, non-tender, non-distended MUSCULOSKELETAL:  No edema; No deformity  SKIN: Warm and dry NEUROLOGIC:  Alert and oriented x 3 PSYCHIATRIC:  Normal affect   Wt Readings from Last 3 Encounters:  02/06/24 153 lb 3.2 oz (69.5 kg)  09/22/23 153 lb 9.6 oz (69.7 kg)  05/20/23 148 lb (67.1 kg)      Studies/Labs Reviewed:   EKG Interpretation Date/Time:  Monday February 06 2024 10:33:31 EDT Ventricular Rate:  76 PR Interval:  130 QRS Duration:  80 QT Interval:  368 QTC Calculation: 414 R Axis:   -11  Text Interpretation: Normal sinus rhythm Possible Left atrial enlargement When compared with ECG of 14-Sep-2022 10:18, No significant change was found Confirmed by Armanda Magic ([id number]) on 02/06/2024 10:51:44 AM   Recent Labs: 02/11/2023: ALT 17; BUN 13; Creatinine, Ser 0.49; Hemoglobin 14.8; Platelets 195.0; Potassium 4.4; Sodium 139; TSH 0.53   Lipid Panel    Component Value Date/Time   CHOL 110 02/11/2023 0756   CHOL 109 05/21/2021 0945   TRIG 58.0 02/11/2023 0756   HDL 35.80 (L) 02/11/2023 0756   HDL 36 (L) 05/21/2021 0945   CHOLHDL 3 02/11/2023 0756   VLDL 11.6 02/11/2023 0756   LDLCALC 62 02/11/2023 0756   LDLCALC 55 05/21/2021 0945    Additional  studies/ records that were reviewed today include:  Office notes from PCP, EKG   ASSESSMENT/PLAN:  In order of problems listed above:  ASCAD - coronary CTA 2021 showed a markedly elevated Coronary Ca score of 6179 and severe heavily calcified multivessel CAD.   -s/p cardiac cath showing 50% prox to mid LAD, 40% mLCx, 80% OM3, 95% prox to mid RCA and underwent PCI of the RCA. 02/2020 -She denies any anginal symptoms since I saw her last -Continue prescription drug management with aspirin 81 mg daily, atorvastatin 20 mg daily, Plavix 75 mg daily with as needed refills  2. Aortic Stenosis -2D echo for heart murmur showed moderate AS with mean AVG and DI 0.45 in 2021 -repeat echo 05/2021 showed normal LVF with mild LVH, moderate LAE and moderate AS with mean AVG and DI 0.54. -Echo 08/28/2023 showed EF 65 to 70% with G1 DD and moderate aortic valve stenosis with AVA 1.11 cm, mean AVG 34 and V-max 3.47m/s -She remains asymptomatic -Repeat 2D echo 08/2024  3. DM type 2 -followed by PCP -continue metformin, statin and ACE I  4.  HTN -BP is at controlled on exam today -Continue drug management with lisinopril 20 mg daily with as needed refills -Check BMET  5.  HLD -LDL goal < 70 -Continue prescription management with atorvastatin 20 mg daily  with as needed refills- -repeat FLP and ALT>>her PCP is checking these next week and she will send me a copy    Medication Adjustments/Labs and Tests Ordered: Current medicines are reviewed at length with the patient today.  Concerns regarding medicines are outlined above.  Medication changes, Labs and Tests ordered today are listed in the Patient Instructions below.  There are no Patient Instructions on file for this visit.   Signed, Armanda Magic, MD  02/06/2024 10:49 AM    [facility] [address] [place], [place], [place]  [address] Phone: [phone]; Fax: [phone]

## 2024-02-06 NOTE — Addendum Note (Signed)
 Addended by: Erick Alley on: 02/06/2024 11:00 AM   Modules accepted: Orders

## 2024-02-09 DIAGNOSIS — N952 Postmenopausal atrophic vaginitis: Secondary | ICD-10-CM | POA: Diagnosis not present

## 2024-02-09 DIAGNOSIS — N993 Prolapse of vaginal vault after hysterectomy: Secondary | ICD-10-CM | POA: Diagnosis not present

## 2024-02-09 DIAGNOSIS — Z4689 Encounter for fitting and adjustment of other specified devices: Secondary | ICD-10-CM | POA: Diagnosis not present

## 2024-02-12 ENCOUNTER — Other Ambulatory Visit: Payer: Self-pay | Admitting: Family Medicine

## 2024-02-12 DIAGNOSIS — E210 Primary hyperparathyroidism: Secondary | ICD-10-CM

## 2024-02-12 DIAGNOSIS — E119 Type 2 diabetes mellitus without complications: Secondary | ICD-10-CM

## 2024-02-12 DIAGNOSIS — E785 Hyperlipidemia, unspecified: Secondary | ICD-10-CM

## 2024-02-12 DIAGNOSIS — I10 Essential (primary) hypertension: Secondary | ICD-10-CM

## 2024-02-13 ENCOUNTER — Encounter: Payer: Self-pay | Admitting: Cardiology

## 2024-02-13 ENCOUNTER — Other Ambulatory Visit (INDEPENDENT_AMBULATORY_CARE_PROVIDER_SITE_OTHER): Payer: Medicare PPO

## 2024-02-13 DIAGNOSIS — E119 Type 2 diabetes mellitus without complications: Secondary | ICD-10-CM | POA: Diagnosis not present

## 2024-02-13 DIAGNOSIS — E785 Hyperlipidemia, unspecified: Secondary | ICD-10-CM | POA: Diagnosis not present

## 2024-02-13 DIAGNOSIS — E210 Primary hyperparathyroidism: Secondary | ICD-10-CM

## 2024-02-13 DIAGNOSIS — I10 Essential (primary) hypertension: Secondary | ICD-10-CM | POA: Diagnosis not present

## 2024-02-13 LAB — COMPREHENSIVE METABOLIC PANEL WITH GFR
ALT: 18 U/L (ref 0–35)
AST: 14 U/L (ref 0–37)
Albumin: 4.2 g/dL (ref 3.5–5.2)
Alkaline Phosphatase: 93 U/L (ref 39–117)
BUN: 16 mg/dL (ref 6–23)
CO2: 25 meq/L (ref 19–32)
Calcium: 9.5 mg/dL (ref 8.4–10.5)
Chloride: 105 meq/L (ref 96–112)
Creatinine, Ser: 0.45 mg/dL (ref 0.40–1.20)
GFR: 92.15 mL/min (ref >60.00)
Glucose, Bld: 150 mg/dL — ABNORMAL HIGH (ref 70–99)
Potassium: 4.1 meq/L (ref 3.5–5.1)
Sodium: 140 meq/L (ref 135–145)
Total Bilirubin: 2.1 mg/dL — ABNORMAL HIGH (ref 0.2–1.2)
Total Protein: 6.3 g/dL (ref 6.0–8.3)

## 2024-02-13 LAB — CBC WITH DIFFERENTIAL/PLATELET
Basophils Absolute: 0 10*3/uL (ref 0.0–0.1)
Basophils Relative: 1 % (ref 0.0–3.0)
Eosinophils Absolute: 0.1 10*3/uL (ref 0.0–0.7)
Eosinophils Relative: 3.7 % (ref 0.0–5.0)
HCT: 44.2 % (ref 36.0–46.0)
Hemoglobin: 14.9 g/dL (ref 12.0–15.0)
Lymphocytes Relative: 28.7 % (ref 12.0–46.0)
Lymphs Abs: 1.1 10*3/uL (ref 0.7–4.0)
MCHC: 33.7 g/dL (ref 30.0–36.0)
MCV: 93.1 fl (ref 78.0–100.0)
Monocytes Absolute: 0.4 10*3/uL (ref 0.1–1.0)
Monocytes Relative: 9.6 % (ref 3.0–12.0)
Neutro Abs: 2.3 10*3/uL (ref 1.4–7.7)
Neutrophils Relative %: 57 % (ref 43.0–77.0)
Platelets: 194 10*3/uL (ref 150.0–400.0)
RBC: 4.75 Mil/uL (ref 3.87–5.11)
RDW: 13.6 % (ref 11.5–15.5)
WBC: 4 10*3/uL (ref 4.0–10.5)

## 2024-02-13 LAB — MICROALBUMIN / CREATININE URINE RATIO
Creatinine,U: 53.2 mg/dL
Microalb Creat Ratio: 42.4 mg/g — ABNORMAL HIGH (ref 0.0–30.0)
Microalb, Ur: 2.3 mg/dL — ABNORMAL HIGH (ref 0.0–1.9)

## 2024-02-13 LAB — LIPID PANEL
Cholesterol: 116 mg/dL (ref 0–200)
HDL: 34.3 mg/dL — ABNORMAL LOW (ref >39.00)
LDL Cholesterol: 67 mg/dL (ref 0–99)
NonHDL: 81.83
Total CHOL/HDL Ratio: 3
Triglycerides: 75 mg/dL (ref 0.0–149.0)
VLDL: 15 mg/dL (ref 0.0–40.0)

## 2024-02-13 LAB — VITAMIN D 25 HYDROXY (VIT D DEFICIENCY, FRACTURES): VITD: 38.11 ng/mL (ref 30.00–100.00)

## 2024-02-13 LAB — TSH: TSH: 0.48 u[IU]/mL (ref 0.35–5.50)

## 2024-02-13 LAB — HEMOGLOBIN A1C: Hgb A1c MFr Bld: 8.1 % — ABNORMAL HIGH (ref 4.6–6.5)

## 2024-02-14 LAB — PTH, INTACT AND CALCIUM
Calcium: 9.7 mg/dL (ref 8.6–10.4)
PTH: 23 pg/mL (ref 16–77)

## 2024-02-20 ENCOUNTER — Encounter: Payer: Self-pay | Admitting: Family Medicine

## 2024-02-20 ENCOUNTER — Ambulatory Visit (INDEPENDENT_AMBULATORY_CARE_PROVIDER_SITE_OTHER): Payer: Medicare PPO | Admitting: Family Medicine

## 2024-02-20 VITALS — BP 118/68 | HR 74 | Temp 97.7°F | Ht 64.0 in | Wt 150.2 lb

## 2024-02-20 DIAGNOSIS — I2510 Atherosclerotic heart disease of native coronary artery without angina pectoris: Secondary | ICD-10-CM | POA: Diagnosis not present

## 2024-02-20 DIAGNOSIS — I10 Essential (primary) hypertension: Secondary | ICD-10-CM

## 2024-02-20 DIAGNOSIS — Z0000 Encounter for general adult medical examination without abnormal findings: Secondary | ICD-10-CM

## 2024-02-20 DIAGNOSIS — Z7984 Long term (current) use of oral hypoglycemic drugs: Secondary | ICD-10-CM

## 2024-02-20 DIAGNOSIS — E119 Type 2 diabetes mellitus without complications: Secondary | ICD-10-CM

## 2024-02-20 DIAGNOSIS — E210 Primary hyperparathyroidism: Secondary | ICD-10-CM

## 2024-02-20 DIAGNOSIS — M8580 Other specified disorders of bone density and structure, unspecified site: Secondary | ICD-10-CM | POA: Diagnosis not present

## 2024-02-20 DIAGNOSIS — Z7189 Other specified counseling: Secondary | ICD-10-CM

## 2024-02-20 DIAGNOSIS — E785 Hyperlipidemia, unspecified: Secondary | ICD-10-CM

## 2024-02-20 DIAGNOSIS — N816 Rectocele: Secondary | ICD-10-CM

## 2024-02-20 MED ORDER — LANTUS SOLOSTAR 100 UNIT/ML ~~LOC~~ SOPN: 30.0000 [IU] | PEN_INJECTOR | Freq: Every day | SUBCUTANEOUS | Status: DC

## 2024-02-20 MED ORDER — LANTUS SOLOSTAR 100 UNIT/ML ~~LOC~~ SOPN
35.0000 [IU] | PEN_INJECTOR | Freq: Every day | SUBCUTANEOUS | 3 refills | Status: DC
Start: 2024-02-20 — End: 2024-08-21

## 2024-02-20 NOTE — Assessment & Plan Note (Signed)
Continue atorvastatin.  Continue work on diet and exercise. 

## 2024-02-20 NOTE — Assessment & Plan Note (Signed)
 Using pessary per outside clinic.

## 2024-02-20 NOTE — Assessment & Plan Note (Signed)
 Vaccines discussed with patient. Mammogram - she is scheduled for 2025 Colonoscopy 2020 DXA 2023.  D/w pt about f/u 2025.  Ordered.   Diet and exercise d/w pt.   Advance directive d/w pt- husband designated if patient were incapacitated.

## 2024-02-20 NOTE — Assessment & Plan Note (Signed)
 Labs d/w pt.   Prev MALB d/w pt.  Already on ACE and wouldn't add another agent now given h/o lower sugars.   Recheck periodically.  Continue work on diet and exercise.  Continue insulin and metformin as is.

## 2024-02-20 NOTE — Assessment & Plan Note (Signed)
Advance directive d/w pt- husband designated if patient were incapacitated.   

## 2024-02-20 NOTE — Progress Notes (Signed)
 Diabetes:  Using medications without difficulties: yes Hypoglycemic episodes: resolved with lower insulin dose. Hyperglycemic episodes:no Feet problems:normal sensation.  Blood Sugars averaging: usually ~125 in the AM, if her sugar is ~200 at night, then she doesn't have lows.  Taking metformin AM and lunchtime.  eye exam within last year: yes Labs d/w pt.   Prev MALB d/w pt.  Already on ACE and wouldn't add another agent now given h/o lower sugars.    Hypertension/CAD  Using medication without problems or lightheadedness: yes Chest pain with exertion:no Edema: some occ BLE edema.  Short of breath:no  Elevated Cholesterol: Using medications without problems: yes Muscle aches: no Diet compliance: d/w pt.   Exercise: d/w pt.   She is using a pessary and that helped, per outside clinic.    Vaccines discussed with patient. Mammogram - she is scheduled for 2025 Colonoscopy 2020 DXA 2023.  D/w pt about f/u 2025.  Ordered.   Diet and exercise d/w pt.   Advance directive d/w pt- husband designated if patient were incapacitated.    PMH and SH reviewed.   Vital signs, Meds and allergies reviewed.  ROS: Per HPI unless specifically indicated in ROS section   GEN: nad, alert and oriented HEENT: mucous membranes moist NECK: supple w/o LA CV: rrr.  Systolic murmur noted. PULM: ctab, no inc wob ABD: soft, +bs EXT: no edema SKIN: no acute rash  Diabetic foot exam: Normal inspection No skin breakdown No calluses  Normal DP pulses Normal sensation to light tough and monofilament Nails normal

## 2024-02-20 NOTE — Patient Instructions (Addendum)
 You can call for a bone density test at [facility] [address] [place] [phone].  I'll update Dr. Micael Adas.   Recheck A1c at a visit in about 6 months.   Update me as needed.  Take care.  Glad to see you.

## 2024-02-20 NOTE — Assessment & Plan Note (Signed)
 Controlled.  Continue lisinopril.  Continue work on diet and exercise.

## 2024-02-20 NOTE — Assessment & Plan Note (Signed)
 Continue lisinopril atorvastatin aspirin and Plavix.  Not having chest pain.  Okay for outpatient follow-up.  Has routine cardiology evaluation.

## 2024-02-20 NOTE — Assessment & Plan Note (Signed)
 History of, recheck PTH and calcium normal.

## 2024-02-27 ENCOUNTER — Other Ambulatory Visit: Payer: Self-pay | Admitting: Cardiology

## 2024-03-12 DIAGNOSIS — R29890 Loss of height: Secondary | ICD-10-CM | POA: Diagnosis not present

## 2024-03-12 DIAGNOSIS — M8588 Other specified disorders of bone density and structure, other site: Secondary | ICD-10-CM | POA: Diagnosis not present

## 2024-03-12 DIAGNOSIS — Z8262 Family history of osteoporosis: Secondary | ICD-10-CM | POA: Diagnosis not present

## 2024-03-12 DIAGNOSIS — Z1231 Encounter for screening mammogram for malignant neoplasm of breast: Secondary | ICD-10-CM | POA: Diagnosis not present

## 2024-03-12 LAB — HM MAMMOGRAPHY

## 2024-03-12 LAB — HM DEXA SCAN

## 2024-03-15 ENCOUNTER — Encounter: Payer: Self-pay | Admitting: Family Medicine

## 2024-03-23 ENCOUNTER — Encounter: Payer: Self-pay | Admitting: Family Medicine

## 2024-04-09 ENCOUNTER — Other Ambulatory Visit: Payer: Self-pay | Admitting: Family Medicine

## 2024-05-09 DIAGNOSIS — L2989 Other pruritus: Secondary | ICD-10-CM | POA: Diagnosis not present

## 2024-05-09 DIAGNOSIS — L570 Actinic keratosis: Secondary | ICD-10-CM | POA: Diagnosis not present

## 2024-05-09 DIAGNOSIS — D2272 Melanocytic nevi of left lower limb, including hip: Secondary | ICD-10-CM | POA: Diagnosis not present

## 2024-05-09 DIAGNOSIS — L538 Other specified erythematous conditions: Secondary | ICD-10-CM | POA: Diagnosis not present

## 2024-05-09 DIAGNOSIS — D2262 Melanocytic nevi of left upper limb, including shoulder: Secondary | ICD-10-CM | POA: Diagnosis not present

## 2024-05-09 DIAGNOSIS — Z85828 Personal history of other malignant neoplasm of skin: Secondary | ICD-10-CM | POA: Diagnosis not present

## 2024-05-09 DIAGNOSIS — L820 Inflamed seborrheic keratosis: Secondary | ICD-10-CM | POA: Diagnosis not present

## 2024-05-09 DIAGNOSIS — D225 Melanocytic nevi of trunk: Secondary | ICD-10-CM | POA: Diagnosis not present

## 2024-05-09 DIAGNOSIS — D2261 Melanocytic nevi of right upper limb, including shoulder: Secondary | ICD-10-CM | POA: Diagnosis not present

## 2024-06-19 ENCOUNTER — Other Ambulatory Visit: Payer: Self-pay | Admitting: Family Medicine

## 2024-06-26 ENCOUNTER — Ambulatory Visit

## 2024-06-26 ENCOUNTER — Other Ambulatory Visit: Payer: Self-pay | Admitting: Family Medicine

## 2024-06-26 DIAGNOSIS — I10 Essential (primary) hypertension: Secondary | ICD-10-CM

## 2024-07-02 ENCOUNTER — Encounter: Payer: Self-pay | Admitting: Family Medicine

## 2024-07-04 ENCOUNTER — Other Ambulatory Visit: Payer: Self-pay | Admitting: Family Medicine

## 2024-07-04 ENCOUNTER — Encounter: Payer: Self-pay | Admitting: Family Medicine

## 2024-07-04 MED ORDER — MAGNESIUM CITRATE PO SOLN: 1.0000 | Freq: Once | ORAL | 0 refills | Status: AC

## 2024-07-04 NOTE — Telephone Encounter (Signed)
 See 07/02/24 MyChart message

## 2024-07-23 ENCOUNTER — Other Ambulatory Visit: Payer: Self-pay | Admitting: Family Medicine

## 2024-08-13 ENCOUNTER — Other Ambulatory Visit: Payer: Self-pay | Admitting: Family Medicine

## 2024-08-13 ENCOUNTER — Ambulatory Visit (HOSPITAL_COMMUNITY)
Admission: RE | Admit: 2024-08-13 | Discharge: 2024-08-13 | Disposition: A | Discharge status: Home / Self Care | Source: Ambulatory Visit | Attending: Cardiology | Admitting: Cardiology

## 2024-08-13 DIAGNOSIS — I350 Nonrheumatic aortic (valve) stenosis: Secondary | ICD-10-CM | POA: Insufficient documentation

## 2024-08-13 LAB — ECHOCARDIOGRAM COMPLETE
AR max vel: 0.74 cm2
AV Area VTI: 0.81 cm2
AV Area mean vel: 0.64 cm2
AV Mean grad: 56 mmHg
AV Peak grad: 79.6 mmHg
Ao pk vel: 4.46 m/s
Area-P 1/2: 2.63 cm2
S' Lateral: 2.4 cm

## 2024-08-14 DIAGNOSIS — N952 Postmenopausal atrophic vaginitis: Secondary | ICD-10-CM | POA: Diagnosis not present

## 2024-08-14 DIAGNOSIS — Z4689 Encounter for fitting and adjustment of other specified devices: Secondary | ICD-10-CM | POA: Diagnosis not present

## 2024-08-14 DIAGNOSIS — N816 Rectocele: Secondary | ICD-10-CM | POA: Diagnosis not present

## 2024-08-14 DIAGNOSIS — N8111 Cystocele, midline: Secondary | ICD-10-CM | POA: Diagnosis not present

## 2024-08-16 ENCOUNTER — Ambulatory Visit: Payer: Self-pay

## 2024-08-16 NOTE — Progress Notes (Signed)
 Call to patient to discuss Dr. Milan recommendation for structural referral. No answer. LVM per DPR asking patient to call our office. Also sent [facility] message.

## 2024-08-17 NOTE — Telephone Encounter (Signed)
 Patient followed up requesting another call.

## 2024-08-21 ENCOUNTER — Encounter: Payer: Self-pay | Admitting: Family Medicine

## 2024-08-21 ENCOUNTER — Ambulatory Visit: Admitting: Family Medicine

## 2024-08-21 VITALS — BP 126/66 | HR 67 | Temp 98.0°F | Ht 64.0 in | Wt 149.2 lb

## 2024-08-21 DIAGNOSIS — R195 Other fecal abnormalities: Secondary | ICD-10-CM | POA: Diagnosis not present

## 2024-08-21 DIAGNOSIS — E119 Type 2 diabetes mellitus without complications: Secondary | ICD-10-CM

## 2024-08-21 DIAGNOSIS — Z794 Long term (current) use of insulin: Secondary | ICD-10-CM

## 2024-08-21 DIAGNOSIS — M40209 Unspecified kyphosis, site unspecified: Secondary | ICD-10-CM | POA: Diagnosis not present

## 2024-08-21 DIAGNOSIS — R011 Cardiac murmur, unspecified: Secondary | ICD-10-CM

## 2024-08-21 LAB — COMPREHENSIVE METABOLIC PANEL WITH GFR
ALT: 17 U/L (ref 0–35)
AST: 17 U/L (ref 0–37)
Albumin: 4.3 g/dL (ref 3.5–5.2)
Alkaline Phosphatase: 101 U/L (ref 39–117)
BUN: 13 mg/dL (ref 6–23)
CO2: 29 meq/L (ref 19–32)
Calcium: 9.9 mg/dL (ref 8.4–10.5)
Chloride: 101 meq/L (ref 96–112)
Creatinine, Ser: 0.55 mg/dL (ref 0.40–1.20)
GFR: 87.48 mL/min (ref >60.00)
Glucose, Bld: 99 mg/dL (ref 70–99)
Potassium: 4.1 meq/L (ref 3.5–5.1)
Sodium: 138 meq/L (ref 135–145)
Total Bilirubin: 2.3 mg/dL — ABNORMAL HIGH (ref 0.2–1.2)
Total Protein: 6.8 g/dL (ref 6.0–8.3)

## 2024-08-21 LAB — CBC WITH DIFFERENTIAL/PLATELET
Basophils Absolute: 0 K/uL (ref 0.0–0.1)
Basophils Relative: 0.6 % (ref 0.0–3.0)
Eosinophils Absolute: 0.1 K/uL (ref 0.0–0.7)
Eosinophils Relative: 2.5 % (ref 0.0–5.0)
HCT: 43.8 % (ref 36.0–46.0)
Hemoglobin: 14.8 g/dL (ref 12.0–15.0)
Lymphocytes Relative: 32 % (ref 12.0–46.0)
Lymphs Abs: 1.6 K/uL (ref 0.7–4.0)
MCHC: 33.7 g/dL (ref 30.0–36.0)
MCV: 89.8 fl (ref 78.0–100.0)
Monocytes Absolute: 0.4 K/uL (ref 0.1–1.0)
Monocytes Relative: 8.7 % (ref 3.0–12.0)
Neutro Abs: 2.9 K/uL (ref 1.4–7.7)
Neutrophils Relative %: 56.2 % (ref 43.0–77.0)
Platelets: 208 K/uL (ref 150.0–400.0)
RBC: 4.88 Mil/uL (ref 3.87–5.11)
RDW: 13.5 % (ref 11.5–15.5)
WBC: 5.1 K/uL (ref 4.0–10.5)
nRBC: 0 /100{WBCs} (ref 0–4)

## 2024-08-21 LAB — POCT GLYCOSYLATED HEMOGLOBIN (HGB A1C): Hemoglobin A1C: 8.4 % — AB (ref 4.0–5.6)

## 2024-08-21 MED ORDER — POLYETHYLENE GLYCOL 3350 17 GM/SCOOP PO POWD: 8.5000 g | Freq: Every day | ORAL | Status: AC

## 2024-08-21 MED ORDER — LANTUS SOLOSTAR 100 UNIT/ML ~~LOC~~ SOPN: 30.0000 [IU] | PEN_INJECTOR | Freq: Every day | SUBCUTANEOUS | Status: DC

## 2024-08-21 NOTE — Progress Notes (Unsigned)
 Diabetes:  Using medications without difficulties:yes Hypoglycemic episodes: no Hyperglycemic episodes: no Feet problems: no Blood Sugars averaging: usually 150-160s in the AMs.  Usually ~200s at night to prev nocturnal lows.   eye exam within last year: yes A1c 8.4. d/w pt at OV.   Taking 30-35 units insulin , depending on recent sugars.   Her schedule had been disrupted and she hadn't been walking at much.  Her schedule has improved.    Echo d/w pt.  AS d/w pt.  D/w pt about echo and cards eval pending. No CP.  Not SOB but she hasn't been walking as much and then noted some deconditioning with walking.  She can walk about 1/2 mile.    Change in stools for about 3 months now.  She had sig constipation and is taking 1/2 dose miralax and 250mg  of mag citrate per day.  That helped with BM frequency but now is having loose stools.  No bloody or black stools.  D/w pt about GI eval sooner, ie locally.  Referral placed.  D/w pt about possible pelvic floor dysfunction.  No abd pain. Stopping metformin  didn't help.    She has chronic postural changes with kyphosis.  D/w pt about postural exercises.    Meds, vitals, and allergies reviewed.   ROS: Per HPI unless specifically indicated in ROS section   GEN: nad, alert and oriented HEENT: mucous membranes moist NECK: supple w/o LA CV: rrr. PULM: ctab, no inc wob ABD: soft, +bs EXT: no edema SKIN: well perfused.

## 2024-08-21 NOTE — Patient Instructions (Addendum)
 Let me know if you can't get set up with GI locally.   Go to the lab on the way out.   If you have mychart we'll likely use that to update you.    I'll await your cardiology notes  Recheck labs in about 3 months at a visit.  Take care.  Glad to see you.

## 2024-08-22 ENCOUNTER — Encounter: Payer: Self-pay | Admitting: Family Medicine

## 2024-08-22 DIAGNOSIS — R195 Other fecal abnormalities: Secondary | ICD-10-CM | POA: Insufficient documentation

## 2024-08-22 DIAGNOSIS — M40209 Unspecified kyphosis, site unspecified: Secondary | ICD-10-CM | POA: Insufficient documentation

## 2024-08-22 NOTE — Assessment & Plan Note (Signed)
 D/w pt about GI eval sooner, ie locally.  Referral placed.  D/w pt about possible pelvic floor dysfunction.  No abd pain. Stopping metformin  didn't help.   Need GI input, discussed with patient.

## 2024-08-22 NOTE — Telephone Encounter (Signed)
 Please check with the referral department and see if they can get this patient scheduled at GI sooner rather than later with any provider.  Thanks.

## 2024-08-22 NOTE — Assessment & Plan Note (Signed)
 Discussed with patient about continued postural exercises as opposed to bracing.

## 2024-08-22 NOTE — Assessment & Plan Note (Signed)
 I think it makes sense to follow-up with cardiology as planned.  Aortic stenosis physiology discussed with patient.

## 2024-08-22 NOTE — Assessment & Plan Note (Signed)
 A1c 8.4. d/w pt at OV.   Taking 30-35 units insulin , depending on recent sugars.   Her schedule had been disrupted and she hadn't been walking at much.  Her schedule has improved more recently.   I do not think it makes sense to change her medications at this point. Recheck labs in about 3 months at a visit.

## 2024-08-23 NOTE — Telephone Encounter (Signed)
 A new referral will have to be entered. At this time it has been closed  due to her telling them that she will continue with [facility].

## 2024-08-26 ENCOUNTER — Other Ambulatory Visit: Payer: Self-pay | Admitting: Family Medicine

## 2024-08-26 DIAGNOSIS — R195 Other fecal abnormalities: Secondary | ICD-10-CM

## 2024-08-26 NOTE — Telephone Encounter (Signed)
 Please check with the referral department to see what can be done to get this patient an appointment locally with GI.  I put in a new referral.

## 2024-08-27 ENCOUNTER — Ambulatory Visit: Payer: Self-pay | Admitting: Family Medicine

## 2024-08-27 ENCOUNTER — Other Ambulatory Visit: Payer: Self-pay | Admitting: Family Medicine

## 2024-08-27 DIAGNOSIS — E119 Type 2 diabetes mellitus without complications: Secondary | ICD-10-CM

## 2024-08-28 NOTE — Telephone Encounter (Signed)
 Metformin  Last filled:  05/17/24, #180 Last OV:  08/21/24, DM f/u Next OV:  11/23/24, 3 mo DM f/u

## 2024-08-29 NOTE — Telephone Encounter (Signed)
 Sent. Thanks.

## 2024-09-05 ENCOUNTER — Encounter: Payer: Self-pay | Admitting: Gastroenterology

## 2024-09-06 NOTE — Telephone Encounter (Signed)
 I sent this referral back to [facility] again 08/27/24  They scheduled the patient for first available, 10/23/24

## 2024-09-17 ENCOUNTER — Other Ambulatory Visit: Payer: Self-pay

## 2024-09-17 ENCOUNTER — Ambulatory Visit: Attending: Cardiovascular Disease | Admitting: Cardiovascular Disease

## 2024-09-17 ENCOUNTER — Encounter: Payer: Self-pay | Admitting: Cardiovascular Disease

## 2024-09-17 ENCOUNTER — Other Ambulatory Visit: Payer: Self-pay | Admitting: Family Medicine

## 2024-09-17 VITALS — BP 158/7 | HR 82 | Ht 64.0 in | Wt 153.8 lb

## 2024-09-17 DIAGNOSIS — I350 Nonrheumatic aortic (valve) stenosis: Secondary | ICD-10-CM | POA: Diagnosis not present

## 2024-09-17 DIAGNOSIS — Z01812 Encounter for preprocedural laboratory examination: Secondary | ICD-10-CM

## 2024-09-17 DIAGNOSIS — I10 Essential (primary) hypertension: Secondary | ICD-10-CM

## 2024-09-17 MED ORDER — METOPROLOL TARTRATE 50 MG PO TABS: ORAL_TABLET | ORAL | 0 refills | Status: DC

## 2024-09-17 NOTE — H&P (View-Only) (Signed)
 Cardiology Office Note:    Date:  09/17/2024   ID:  Gloria [name], DOB Jul 16, 1946, MRN [id number]  PCP:  Cleatus Arlyss RAMAN, MD   [facility] Providers Cardiologist:  Wilbert Bihari, MD     Referring MD: Cleatus Arlyss RAMAN, MD   Chief Complaint  Patient presents with   Aortic Stenosis    History of Present Illness:    Gloria [name] is a 78 y.o. female referred for evaluation of severe aortic stenosis.  She has a history of coronary artery disease with severely elevated coronary calcium  score.  She underwent cardiac catheterization in 2021 demonstrating heavily calcified but patent coronary arteries with nonobstructive disease in the LAD and left circumflex and severe stenosis in the RCA treated with PCI.  Comorbid conditions include type 2 diabetes, hypertension, and mixed hyperlipidemia.  The patient had an echocardiogram 08/13/2024 that demonstrated normal LVEF of 60 to 65%, moderate concentric LVH, grade 1 diastolic dysfunction, normal RV function, normal estimated PA pressure, mild mitral regurgitation, and severe aortic stenosis with a mean gradient of 56 mmHg and calculated aortic valve area of 0.81 cm with a dimensionless index of 0.26.  The patient is here with her husband today. She has done well since her PCI in 2021 until the past 6-12 months when she has developed symptoms of fatigue and exertional dyspnea with 2 flights of stairs, getting in a hurry, or walking up an incline. No chest pain, chest pressure, or tightness in the chest. No orthopnea or PND. No lightheadedness or syncope. She reports swelling in her ankles especially with salty foods.   She was told of having a heart murmur about 15 years ago. She has regular dental work and had an implant for a broken tooth, but no other problems. She has sleep apnea and sleeps with a dental appliance.   Past Medical History:  Diagnosis Date   Advance directive discussed with patient 01/03/2012   12/2011-AD d/w pt.  She  has talked to husband but doesn't have living will yet.  Full code in discussion with patient today.  Would not want prolonged interventions if she were profoundly and/or permanently impaired, ie severe dementia or a condition with no hope of improvement.   01/13/15-Advance directive d/w pt- husband designated if patient were incapacitated.     Allergic rhinitis    Aortic stenosis    Moderate by echo 08/2022   Arthritis    Asthma    as a chld   Complication of anesthesia    Coronary artery disease    Diabetes mellitus without complication (HCC) 05/24/2007   Qualifier: Diagnosis of  By: Bartley MD, Lamar Mulch    Female cystocele 08/10/2012   Health care maintenance 02/09/2018   Heart murmur    Hemorrhoids    History of kidney stones    Hypertension    Left shoulder pain 07/20/2016   Medicare annual wellness visit, subsequent 01/03/2012   MENOPAUSE, SURGICAL 12/16/2009   Qualifier: Diagnosis of  By: Henrietta LPN, Regina     Pure hypercholesterolemia 03/30/2011   RHINITIS 12/24/2010   Qualifier: Diagnosis of  By: Cleatus MD, Arlyss     Sleep apnea    wears mouthpiece   Snoring 01/02/2020   Past Surgical History:  Procedure Laterality Date   ABDOMINAL HYSTERECTOMY     BASAL CELL CARCINOMA EXCISION  08/23/2017   left upper arm   CARPAL TUNNEL RELEASE     with bilateral releases   CORONARY ATHERECTOMY N/A 02/26/2020  Procedure: CORONARY ATHERECTOMY;  Surgeon: Wonda Sharper, MD;  Location: [facility] INVASIVE CV LAB;  Service: Cardiovascular;  Laterality: N/A;   CYSTECTOMY     Left breast, right lumpectomy B9   INGUINAL HERNIA REPAIR     Right (Dr. Neysa)   LEFT HEART CATH AND CORONARY ANGIOGRAPHY N/A 02/22/2020   Procedure: LEFT HEART CATH AND CORONARY ANGIOGRAPHY;  Surgeon: Wonda Sharper, MD;  Location: [facility] INVASIVE CV LAB;  Service: Cardiovascular;  Laterality: N/A;   NSVD     X 2   PARATHYROIDECTOMY Right 09/23/2022   Procedure: RIGHT INFERIOR PARATHYROIDECTOMY AND RIGHT SUPERIOR  PARATHYROID  BIOPSY;  Surgeon: Eletha Boas, MD;  Location: [facility] ORS;  Service: General;  Laterality: Right;   REVERSE SHOULDER ARTHROPLASTY Right 12/18/2021   Procedure: REVERSE SHOULDER ARTHROPLASTY;  Surgeon: Sharl Selinda Dover, MD;  Location: [facility] ORS;  Service: Orthopedics;  Laterality: Right;  150   TVH with vaginal prolapse  08/29/09   Transvag Tape w/Varitensor, Ant & Post Colporraphy, Uterosacral Lig susp, McCall Culdoplasty   ueteroscopy  11/01   without laser secondary stone   Urological procedure  10/22 - 08/31/09   [facility]    Current Medications: Current Meds  Medication Sig   ACCU-CHEK AVIVA PLUS test strip CHECK BLOOD SUGAR FOUR TIMES DAILY AS NEEDED   ACCU-CHEK FASTCLIX LANCETS MISC Use to check blood sugar 4 times daily as needed.  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   aspirin  EC 81 MG tablet Take 1 tablet (81 mg total) by mouth daily.   atorvastatin  (LIPITOR) 20 MG tablet TAKE 1 TABLET BY MOUTH ONCE DAILY   B-D ULTRAFINE III SHORT PEN 31G X 8 MM MISC USE AS DIRECTED   Blood Glucose Monitoring Suppl (ACCU-CHEK AVIVA PLUS) W/DEVICE KIT Use to check blood sugar 4 times daily as needed.  Diagnosis:  E11.9  Insulin  Dependent   Cholecalciferol (VITAMIN D ) 50 MCG (2000 UT) CAPS Take 2,000 Units by mouth daily.   clopidogrel  (PLAVIX ) 75 MG tablet TAKE 1 TABLET BY MOUTH ONCE DAILY   conjugated estrogens (PREMARIN) vaginal cream Place 1 applicator vaginally every 14 (fourteen) days.   [employer] MAGNESIUM  CITRATE PO Take 250 mg by mouth once.   insulin  glargine (LANTUS  SOLOSTAR) 100 UNIT/ML Solostar Pen Inject 30-35 Units into the skin daily.   lisinopril  (ZESTRIL ) 20 MG tablet TAKE ONE TABLET BY MOUTH ONCE DAILY   metFORMIN  (GLUCOPHAGE ) 500 MG tablet TAKE ONE TABLET BY MOUTH TWICE DAILY   nitroGLYCERIN  (NITROSTAT ) 0.4 MG SL tablet Place 1 tablet (0.4 mg total) under the tongue every 5 (five) minutes as needed.   Omega-3 Fatty Acids (FISH OIL PO) Take 1 capsule by mouth daily.   polyethylene  glycol powder (GLYCOLAX/MIRALAX) 17 GM/SCOOP powder Take 8.5 g by mouth daily.   vitamin C (ASCORBIC ACID) 500 MG tablet Take 500 mg by mouth daily.     Allergies:   Sulfonamide derivatives, Ciprofloxin hcl [ciprofloxacin ], and Penicillins   ROS:   Please see the history of present illness.    All other systems reviewed and are negative.  EKGs/Labs/Other Studies Reviewed:    The following studies were reviewed today: Cardiac Studies & Procedures   ______________________________________________________________________________________________ CARDIAC CATHETERIZATION  CARDIAC CATHETERIZATION 02/26/2020  Conclusion  Prox LAD to Mid LAD lesion is 50% stenosed.  Mid Cx lesion is 40% stenosed.  3rd Mrg lesion is 80% stenosed.  Prox RCA to Mid RCA lesion is 95% stenosed.  A drug-eluting stent was successfully placed using a STENT RESOLUTE ONYX 3.5X12.  Post intervention,  there is a 0% residual stenosis.  Successful atherectomy and stenting of severe calcific stenosis of the mid right coronary artery, reducing the lesion from 95% to 0% with TIMI-3 flow both pre and post procedure.  Findings Coronary Findings Diagnostic  Dominance: Right  Left Main The vessel is severely calcified. The left main is heavily calcified with mild distal tapering but no significant stenosis the vessel divides into the LAD and left circumflex, both of which are large vessels, and there is a small intermediate branch as well.  Left Anterior Descending Prox LAD to Mid LAD lesion is 50% stenosed. The lesion is severely calcified.  Left Circumflex Mid Cx lesion is 40% stenosed. The lesion is moderately calcified.  Third Obtuse Marginal Branch 3rd Mrg lesion is 80% stenosed. The lesion is calcified. Severe branch vessel disease with heavy calcification, not amenable to PCI because of tortuosity and small caliber vessel  Right Coronary Artery Prox RCA to Mid RCA lesion is 95% stenosed. The lesion is  severely calcified. Severe calcific lesion located at the bifurcation of the mid RCA and acute marginal branch (large vessel)  Intervention  Prox RCA to Mid RCA lesion Stent CATHETER LAUNCHER 6FR JR4 SH guide catheter was inserted. Lesion crossed with guidewire using a WIRE COUGAR XT STRL 190CM. Pre-stent angioplasty was performed using a BALLOON SAPPHIRE 3.0X12. Maximum pressure:  6 atm. A drug-eluting stent was successfully placed using a STENT RESOLUTE ONYX 3.5X12. Post-stent angioplasty was performed using a BALLOON SAPPHIRE [place] 3.75X8. Maximum pressure:  16 atm. Initially, an AL 0.75 guide catheter was utilized.  This caused marked pressure dampening and we were unable to use this catheter for the procedure.  I changed out to a JR4 sidehole guide.  The patient tolerated this guide without any significant pressure change.  Heparin  was administered for anticoagulation and a therapeutic ACT is achieved.  A Viper flex wire is advanced across the lesion into the distal RCA.  Atherectomy is performed with a CSI 1.25 mm classic crown for multiple runs on low speed and high speed.  The patient tolerated this well.  Following atherectomy, the Viper wire was removed and a cougar wire is advanced into the distal RCA.  The lesion is dilated with a 3.0 mm semicompliant balloon, stented with a 3.5 x 12 mm resolute Onyx DES deployed at 12 atm, and postdilated with a 3.75 x 8 mm noncompliant balloon to 16 atm.  She tolerated the entire procedure well.  There is 0% residual stenosis and TIMI-3 flow at the completion of the procedure. Post-Intervention Lesion Assessment The intervention was successful. Pre-interventional TIMI flow is 3. Post-intervention TIMI flow is 3. No complications occurred at this lesion. There is a 0% residual stenosis post intervention.   CARDIAC CATHETERIZATION  CARDIAC CATHETERIZATION 02/22/2020  Conclusion 1.  Severe single-vessel coronary artery disease with severe calcific stenosis of  a large, dominant RCA 2.  Heavily calcified coronary arteries with multivessel calcification but nonobstructive disease in the left main, LAD, and left circumflex with the exception of severe branch vessel disease in the distal circumflex appropriate for medical therapy 3.  Normal LV function with normal LVEDP, LVEF estimated at 55 to 65%  Recommendations: The RCA is amenable to PCI.  The lesion is complex because of severe calcification and involvement of a large acute marginal branch.  Recommend start clopidogrel  300 mg x 1, then 75 mg daily.  Plan for atherectomy and stenting next week.  Plan reviewed with the patient and her husband.  Findings Coronary Findings Diagnostic  Dominance: Right  Left Main The vessel is severely calcified. The left main is heavily calcified with mild distal tapering but no significant stenosis the vessel divides into the LAD and left circumflex, both of which are large vessels, and there is a small intermediate branch as well.  Left Anterior Descending Prox LAD to Mid LAD lesion is 50% stenosed. The lesion is severely calcified.  Left Circumflex Mid Cx lesion is 40% stenosed. The lesion is moderately calcified.  Third Obtuse Marginal Branch 3rd Mrg lesion is 80% stenosed. The lesion is calcified. Severe branch vessel disease with heavy calcification, not amenable to PCI because of tortuosity and small caliber vessel  Right Coronary Artery Prox RCA to Mid RCA lesion is 95% stenosed. The lesion is severely calcified. Severe calcific lesion located at the bifurcation of the mid RCA and acute marginal branch (large vessel)  Intervention  No interventions have been documented.     ECHOCARDIOGRAM  ECHOCARDIOGRAM COMPLETE 08/13/2024  Narrative ECHOCARDIOGRAM REPORT    Patient Name:   Gloria [name] Date of Exam: 08/13/2024 Medical Rec #:  [id number]      Height:       64.0 in Accession #:    [id number]     Weight:       150.2 lb Date of Birth:   Sep 24, 1946      BSA:          1.732 m Patient Age:    67 years       BP:           143/88 mmHg Patient Gender: F              HR:           67 bpm. Exam Location:  [address]  Procedure: 2D Echo, 3D Echo, Cardiac Doppler, Color Doppler and Strain Analysis (Both Spectral and Color Flow Doppler were utilized during procedure).  Indications:    I35.0 Aortic Stenosis  History:        Patient has prior history of Echocardiogram examinations, most recent 08/23/2023. CAD, Signs/Symptoms:Murmur and Shortness of Breath; Risk Factors:Sleep Apnea, Diabetes and Hypertension.  Sonographer:    Waldo Guadalajara RCS Referring Phys: [phone] TRACI R TURNER  IMPRESSIONS   1. Left ventricular ejection fraction, by estimation, is 60 to 65%. Left ventricular ejection fraction by 3D volume is 62 %. The left ventricle has normal function. The left ventricle has no regional wall motion abnormalities. There is moderate concentric left ventricular hypertrophy. Left ventricular diastolic parameters are consistent with Grade I diastolic dysfunction (impaired relaxation). The average left ventricular global longitudinal strain is -16.3 %. 2. Right ventricular systolic function is normal. The right ventricular size is normal. There is normal pulmonary artery systolic pressure. The estimated right ventricular systolic pressure is 34.1 mmHg. 3. Left atrial size was severely dilated. 4. Interatrial septum shifted right, no definite shunt visualized by color doppler. 5. The mitral valve is normal in structure. Mild mitral valve regurgitation. No evidence of mitral stenosis. 6. The aortic valve is tricuspid. There is severe calcifcation of the aortic valve. Aortic valve regurgitation is trivial. Severe aortic valve stenosis. Aortic valve area, by VTI measures 0.81 cm. Aortic valve mean gradient measures 56.0 mmHg. 7. The inferior vena cava is normal in size with greater than 50% respiratory variability, suggesting right  atrial pressure of 3 mmHg.  FINDINGS Left Ventricle: Left ventricular ejection fraction, by estimation, is 60 to 65%. Left ventricular ejection fraction by  3D volume is 62 %. The left ventricle has normal function. The left ventricle has no regional wall motion abnormalities. The average left ventricular global longitudinal strain is -16.3 %. The left ventricular internal cavity size was normal in size. There is moderate concentric left ventricular hypertrophy. Left ventricular diastolic parameters are consistent with Grade I diastolic dysfunction (impaired relaxation).  Right Ventricle: The right ventricular size is normal. No increase in right ventricular wall thickness. Right ventricular systolic function is normal. There is normal pulmonary artery systolic pressure. The tricuspid regurgitant velocity is 2.79 m/s, and with an assumed right atrial pressure of 3 mmHg, the estimated right ventricular systolic pressure is 34.1 mmHg.  Left Atrium: Left atrial size was severely dilated.  Right Atrium: Right atrial size was normal in size.  Mitral Valve: The mitral valve is normal in structure. There is mild calcification of the mitral valve leaflet(s). Mild mitral annular calcification. Mild mitral valve regurgitation. No evidence of mitral valve stenosis.  Tricuspid Valve: The tricuspid valve is normal in structure. Tricuspid valve regurgitation is mild.  Aortic Valve: The aortic valve is tricuspid. There is severe calcifcation of the aortic valve. Aortic valve regurgitation is trivial. Severe aortic stenosis is present. Aortic valve mean gradient measures 56.0 mmHg. Aortic valve peak gradient measures 79.6 mmHg. Aortic valve area, by VTI measures 0.81 cm.  Pulmonic Valve: The pulmonic valve was normal in structure. Pulmonic valve regurgitation is trivial.  Aorta: The aortic root is normal in size and structure.  Venous: The inferior vena cava is normal in size with greater than 50%  respiratory variability, suggesting right atrial pressure of 3 mmHg.  IAS/Shunts: Interatrial septum shifted right, no definite shunt visualized by color doppler.  Additional Comments: 3D was performed not requiring image post processing on an independent workstation and was normal.   LEFT VENTRICLE PLAX 2D LVIDd:         3.50 cm         Diastology LVIDs:         2.40 cm         LV e' medial:    5.98 cm/s LV PW:         1.50 cm         LV E/e' medial:  17.6 LV IVS:        1.60 cm         LV e' lateral:   10.70 cm/s LVOT diam:     2.00 cm         LV E/e' lateral: 9.8 LV SV:         93 LV SV Index:   54              2D Longitudinal LVOT Area:     3.14 cm        Strain LV IVRT:       113 msec        2D Strain GLS   -14.4 % (A4C): 2D Strain GLS   -18.9 % (A3C): 2D Strain GLS   -15.5 % (A2C): 2D Strain GLS   -16.3 % Avg:  3D Volume EF LV 3D EF:    Left ventricul ar ejection fraction by 3D volume is 62 %.  3D Volume EF: 3D EF:        62 % LV EDV:       142 ml LV ESV:       54 ml LV SV:        88 ml  RIGHT VENTRICLE RV Basal diam:  3.30 cm    PULMONARY VEINS RV S prime:     9.14 cm/s  A Reversal Velocity: 32.20 cm/s RVSP:           34.1 mmHg  Diastolic Velocity:  36.70 cm/s S/D Velocity:        1.40 Systolic Velocity:   50.80 cm/s  LEFT ATRIUM             Index        RIGHT ATRIUM           Index LA diam:        5.50 cm 3.18 cm/m   RA Pressure: 3.00 mmHg LA Vol (A2C):   80.7 ml 46.59 ml/m  RA Area:     15.80 cm LA Vol (A4C):   92.8 ml 53.57 ml/m  RA Volume:   41.10 ml  23.73 ml/m LA Biplane Vol: 86.9 ml 50.17 ml/m AORTIC VALVE AV Area (Vmax):    0.74 cm AV Area (Vmean):   0.64 cm AV Area (VTI):     0.81 cm AV Vmax:           446.00 cm/s AV Vmean:          357.000 cm/s AV VTI:            1.150 m AV Peak Grad:      79.6 mmHg AV Mean Grad:      56.0 mmHg LVOT Vmax:         105.00 cm/s LVOT Vmean:        72.600 cm/s LVOT VTI:          0.296 m LVOT/AV  VTI ratio: 0.26  AORTA Ao Root diam: 3.10 cm Ao Asc diam:  3.40 cm  MITRAL VALVE                TRICUSPID VALVE MV Area (PHT):              TR Peak grad:   31.1 mmHg MV Decel Time:              TR Vmax:        279.00 cm/s MV E velocity: 105.00 cm/s  Estimated RAP:  3.00 mmHg MV A velocity: 139.00 cm/s  RVSP:           34.1 mmHg MV E/A ratio:  0.76 SHUNTS Systemic VTI:  0.30 m Systemic Diam: 2.00 cm  Dalton McleanMD Electronically signed by Ezra Kanner Signature Date/Time: 08/13/2024/4:33:53 PM    Final      CT SCANS  CT CORONARY MORPH W/CTA COR W/SCORE 02/15/2020  Addendum 02/15/2020  8:55 AM ADDENDUM REPORT: 02/15/2020 08:52  EXAM: OVER-READ INTERPRETATION  CT CHEST  The following report is an over-read performed by radiologist Dr. Waddell Cola [facility] on 02/15/2020. This over-read does not include interpretation of cardiac or coronary anatomy or pathology. The coronary calcium  score/coronary CTA interpretation by the cardiologist is attached.  COMPARISON:  None.  FINDINGS: Cardiovascular: Aortic atherosclerosis. Mild cardiac enlargement. No pericardial effusion.  Mediastinum/nodes: No mass or adenopathy identified.  Lungs/pleura: Scarring identified within the right upper lobe and right middle lobe.  Upper abdomen: No acute abnormality.  Musculoskeletal: Moderate multilevel thoracic degenerative disc disease with vacuum disc and ventral endplate spurring.  IMPRESSION: 1. No mass or adenopathy identified. 2. Thoracic degenerative disc disease.   Electronically Signed By: Waddell Calk M.D. On: 02/15/2020 08:52  Narrative CLINICAL DATA:  Hx of chestpain and dyspnea.  Family hx of thoracic aortic aneurysm  EXAM: Cardiac/Coronary  CTA  TECHNIQUE: The patient was scanned on a Siemens Somatoform go.Top scanner.  FINDINGS: A retrospective scan was triggered in the descending thoracic aorta. Axial non-contrast 3 mm slices were  carried out through the heart. The data set was analyzed on a dedicated work station and scored using the Agatson method. Gantry rotation speed was 330 msecs and collimation was .6 mm. 100mg  of metoprolol  po, 5mg  iv and 0.8 mg of sl NTG was given. The 3D data set was reconstructed in 5% intervals of the 60-95 % of the R-R cycle. Diastolic phases were analyzed on a dedicated work station using MPR, MIP and VRT modes. The patient received 75 cc of contrast.  Aorta: Normal size. Acending and descending aortic wall calcifications. No dissection.  Aortic Valve:  Trileaflet. mild calcifications.  Coronary Arteries:  Normal coronary origin.  Right dominance.  RCA is a large dominant artery that gives rise to PDA and PLA. There is heavily calcified plaque thoughout the vessel causing severe stenosis (>70% )  Left main is a large artery that gives rise to LAD and LCX arteries.  LAD is a large vessel that has heavily calcified plaque in the proximal, mid and distal segments causing severe stenosis (>70% )  LCX is a non-dominant artery that gives rise to 2 obtuse marginal branches. There is heavily calcified plaque in the proximal to mid segments causing severe stenosis (>70% )  Other findings:  Normal pulmonary vein drainage into the left atrium.  Normal left atrial appendage without a thrombus.  Normal size of the pulmonary artery.  IMPRESSION: 1. Coronary calcium  score of 6179. This was 24 percentile for age and sex matched control.  2. Normal ascending aorta size  3. Normal coronary origin with right dominance.  4. 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 CAD-RADS 4 Severe stenosis. (70-99% or > 50% left main). Cardiac catheterization or CT FFR is recommended. Consider symptom-guided anti-ischemic pharmacotherapy as well as risk factor modification per guideline directed care. Additional analysis with CT FFR will  be submitted and reported separately.  Electronically Signed: By: Redell Cave M.D. On: 02/14/2020 17:17     ______________________________________________________________________________________________      EKG:        Recent Labs: 02/13/2024: TSH 0.48 08/21/2024: ALT 17; BUN 13; Creatinine, Ser 0.55; Hemoglobin 14.8; Platelets 208.0; Potassium 4.1; Sodium 138  Recent Lipid Panel    Component Value Date/Time   CHOL 116 02/13/2024 0758   CHOL 109 05/21/2021 0945   TRIG 75.0 02/13/2024 0758   HDL 34.30 (L) 02/13/2024 0758   HDL 36 (L) 05/21/2021 0945   CHOLHDL 3 02/13/2024 0758   VLDL 15.0 02/13/2024 0758   LDLCALC 67 02/13/2024 0758   LDLCALC 55 05/21/2021 0945        Physical Exam:    VS:  BP (!) 158/7   Pulse 82   Ht 5' 4 (1.626 m)   Wt 153 lb 12.8 oz (69.8 kg)   LMP 08/08/2009   SpO2 96%   BMI 26.40 kg/m     Wt Readings from Last 3 Encounters:  09/17/24 153 lb 12.8 oz (69.8 kg)  08/21/24 149 lb 3.2 oz (67.7 kg)  02/20/24 150 lb 3.2 oz (68.1 kg)     GEN:  Well nourished, well developed in no acute distress HEENT: Normal NECK: No JVD; No carotid bruits LYMPHATICS: No lymphadenopathy CARDIAC: RRR, 3/6 harsh crescendo decrescendo murmur at  the right upper sternal border RESPIRATORY:  Clear to auscultation without rales, wheezing or rhonchi  ABDOMEN: Soft, non-tender, non-distended MUSCULOSKELETAL:  No edema; No deformity  SKIN: Warm and dry NEUROLOGIC:  Alert and oriented x 3 PSYCHIATRIC:  Normal affect   Assessment & Plan Nonrheumatic aortic (valve) stenosis The patient has severe, stage D1 aortic stenosis.  Her echo demonstrates fairly marked progression of her aortic stenosis now with a mean transvalvular gradient greater than 55 mmHg consistent with very severe aortic stenosis.  I reviewed her echo images and she has severely calcified, restricted aortic valve leaflets.  Doppler data reveals very severe aortic stenosis with a mean gradient 56  mmHg and calculated aortic valve area ranging from 0.64 to 0.81 cm depending on methodology.  Pre-procedure lab exam  I have reviewed the natural history of aortic stenosis with the patient and their family members who are present today. We have discussed the limitations of medical therapy and the poor prognosis associated with symptomatic aortic stenosis. We have reviewed potential treatment options, including palliative medical therapy, conventional surgical aortic valve replacement, and transcatheter aortic valve replacement. We discussed treatment options in the context of the patient's specific comorbid medical conditions.     The patient understands the need for further evaluation which would include cardiac catheterization with coronary angiography to reevaluate her coronary artery disease.  With her severely elevated coronary calcium  score, known calcific nonobstructive plaquing in the left main, LAD, and left circumflex, and previous RCA stenting, catheter angiography will be necessary. I have reviewed the risks, indications, and alternatives to cardiac catheterization, possible angioplasty, and stenting with the patient. Risks include but are not limited to bleeding, infection, vascular injury, stroke, myocardial infection, arrhythmia, kidney injury, radiation-related injury in the case of prolonged fluoroscopy use, emergency cardiac surgery, and death. The patient understands the risks of serious complication is 1-2 in 1000 with diagnostic cardiac cath and 1-2% or less with angioplasty/stenting.  She will also be referred for a gated cardiac CTA and a CTA of the chest, abdomen, and pelvis for TAVR planning.  Once her studies are completed, she will be referred for formal cardiac surgical consultation as part of a multidisciplinary approach to her care.  Informed Consent   Shared Decision Making/Informed Consent The risks [stroke (1 in 1000), death (1 in 1000), kidney failure [usually temporary]  (1 in 500), bleeding (1 in 200), allergic reaction [possibly serious] (1 in 200)], benefits (diagnostic support and management of coronary artery disease) and alternatives of a cardiac catheterization were discussed in detail with Gloria [name] and she is willing to proceed.      Medication Adjustments/Labs and Tests Ordered: Current medicines are reviewed at length with the patient today.  Concerns regarding medicines are outlined above.  Orders Placed This Encounter  Procedures   Basic metabolic panel with GFR   CBC   No orders of the defined types were placed in this encounter.   Patient Instructions  Medication Instructions:  ONCE- Take Metoprolol  Tartrate (Lopressor ) 50 mg once prior to your CT scan on 09/21/24  *If you need a refill on your cardiac medications before your next appointment, please call your pharmacy*  Lab Work: To be completed today: BMP and CBC  If you have labs (blood work) drawn today and your tests are completely normal, you will receive your results only by: MyChart Message (if you have MyChart) OR A paper copy in the mail If you have any lab test that is abnormal or we need  to change your treatment, we will call you to review the results.  Testing/Procedures: Your provider has requested that you have a CT scan as part of your TAVR work-up. You are scheduled for 09/21/24 at [address] [place], [place] [address]. You will go to the 2nd floor.  Your physician has requested that you have a cardiac catheterization. Cardiac catheterization is used to diagnose and/or treat various heart conditions. Doctors may recommend this procedure for a number of different reasons. The most common reason is to evaluate chest pain. Chest pain can be a symptom of coronary artery disease (CAD), and cardiac catheterization can show whether plaque is narrowing or blocking your heart's arteries. This procedure is also used to evaluate the valves, as well as measure the blood flow and  oxygen levels in different parts of your heart. For further information please visit [email or web address] Please follow instruction sheet, as given.   Follow-Up: At [facility], you and your health needs are our priority.  As part of our continuing mission to provide you with exceptional heart care, our providers are all part of one team.  This team includes your primary Cardiologist (physician) and Advanced Practice Providers or APPs (Physician Assistants and Nurse Practitioners) who all work together to provide you with the care you need, when you need it.  Your next appointment:   Per structural heart team  Provider:   Ozell Fell, MD or Izetta Hummer, PA-C    We recommend signing up for the patient portal called MyChart.  Sign up information is provided on this After Visit Summary.  MyChart is used to connect with patients for Virtual Visits (Telemedicine).  Patients are able to view lab/test results, encounter notes, upcoming appointments, etc.  Non-urgent messages can be sent to your provider as well.   To learn more about what you can do with MyChart, go to [email or web address].   Other Instructions       Cardiac/Peripheral Catheterization   You are scheduled for a Cardiac Catheterization on Wednesday, November 26 with Dr. Ozell Fell.  1. Please arrive at the [facility] (Main Entrance A) at [facility]: [address] [place], [place] [address] at 8:00 AM (This time is 2 hour(s) before your procedure to ensure your preparation). Your procedure is scheduled to begin at 10 AM.  Free valet parking service is available. You will check in at ADMITTING. The support person will be asked to wait in the waiting room.  It is OK to have someone drop you off and come back when you are ready to be discharged.        Special note: Every effort is made to have your procedure done on time. Please understand that emergencies sometimes delay scheduled  procedures.  2. Diet: Nothing to eat after midnight.  3. Hydration:You need to be well hydrated before your procedure. On November 26, you may drink approved liquids (see below) until 2 hours before the procedure, with 16 oz of water  as your last intake.   List of approved liquids water , clear juice, clear tea, black coffee, fruit juices, non-citric and without pulp, carbonated beverages, Gatorade, Kool -Aid, plain Jello-O and plain ice popsicles.  4. Labs: You will need to have blood drawn on Monday, November 10 at [facility] (1st Floor), [address] [place], [place] [address]. You do not need to be fasting.  5. Medication instructions in preparation for your procedure:   Contrast Allergy: No  Stop taking, Lisinopril  (Zestril  or Prinivil )  on 10/02/24 (last dose to be taken on 10/02/24)  Take only 14 units of insulin  the night before your procedure. Do not take any insulin  on the day of the procedure.  Do not take Diabetes Med Glucophage  (Metformin ) on the day of the procedure and HOLD 48 HOURS AFTER THE PROCEDURE.  On the morning of your procedure, take Aspirin  81 mg and Plavix /Clopidogrel  and any morning medicines NOT listed above.  You may use sips of water .  6. Plan to go home the same day, you will only stay overnight if medically necessary. 7. You MUST have a responsible adult to drive you home. 8. An adult MUST be with you the first 24 hours after you arrive home. 9. Bring a current list of your medications, and the last time and date medication taken. 10. Bring ID and current insurance cards. 11.Please wear clothes that are easy to get on and off and wear slip-on shoes.  Thank you for allowing us  to care for you!   -- [facility] Cardiovascular services     Signed, Ozell Fell, MD  09/17/2024 4:26 PM    [facility]

## 2024-09-17 NOTE — Progress Notes (Signed)
 Cardiology Office Note:    Date:  09/17/2024   ID:  Gloria [name], DOB Jul 16, 1946, MRN [id number]  PCP:  Cleatus Arlyss RAMAN, MD   [facility] Providers Cardiologist:  Wilbert Bihari, MD     Referring MD: Cleatus Arlyss RAMAN, MD   Chief Complaint  Patient presents with   Aortic Stenosis    History of Present Illness:    Gloria [name] is a 78 y.o. female referred for evaluation of severe aortic stenosis.  She has a history of coronary artery disease with severely elevated coronary calcium  score.  She underwent cardiac catheterization in 2021 demonstrating heavily calcified but patent coronary arteries with nonobstructive disease in the LAD and left circumflex and severe stenosis in the RCA treated with PCI.  Comorbid conditions include type 2 diabetes, hypertension, and mixed hyperlipidemia.  The patient had an echocardiogram 08/13/2024 that demonstrated normal LVEF of 60 to 65%, moderate concentric LVH, grade 1 diastolic dysfunction, normal RV function, normal estimated PA pressure, mild mitral regurgitation, and severe aortic stenosis with a mean gradient of 56 mmHg and calculated aortic valve area of 0.81 cm with a dimensionless index of 0.26.  The patient is here with her husband today. She has done well since her PCI in 2021 until the past 6-12 months when she has developed symptoms of fatigue and exertional dyspnea with 2 flights of stairs, getting in a hurry, or walking up an incline. No chest pain, chest pressure, or tightness in the chest. No orthopnea or PND. No lightheadedness or syncope. She reports swelling in her ankles especially with salty foods.   She was told of having a heart murmur about 15 years ago. She has regular dental work and had an implant for a broken tooth, but no other problems. She has sleep apnea and sleeps with a dental appliance.   Past Medical History:  Diagnosis Date   Advance directive discussed with patient 01/03/2012   12/2011-AD d/w pt.  She  has talked to husband but doesn't have living will yet.  Full code in discussion with patient today.  Would not want prolonged interventions if she were profoundly and/or permanently impaired, ie severe dementia or a condition with no hope of improvement.   01/13/15-Advance directive d/w pt- husband designated if patient were incapacitated.     Allergic rhinitis    Aortic stenosis    Moderate by echo 08/2022   Arthritis    Asthma    as a chld   Complication of anesthesia    Coronary artery disease    Diabetes mellitus without complication (HCC) 05/24/2007   Qualifier: Diagnosis of  By: Bartley MD, Lamar Mulch    Female cystocele 08/10/2012   Health care maintenance 02/09/2018   Heart murmur    Hemorrhoids    History of kidney stones    Hypertension    Left shoulder pain 07/20/2016   Medicare annual wellness visit, subsequent 01/03/2012   MENOPAUSE, SURGICAL 12/16/2009   Qualifier: Diagnosis of  By: Henrietta LPN, Regina     Pure hypercholesterolemia 03/30/2011   RHINITIS 12/24/2010   Qualifier: Diagnosis of  By: Cleatus MD, Arlyss     Sleep apnea    wears mouthpiece   Snoring 01/02/2020   Past Surgical History:  Procedure Laterality Date   ABDOMINAL HYSTERECTOMY     BASAL CELL CARCINOMA EXCISION  08/23/2017   left upper arm   CARPAL TUNNEL RELEASE     with bilateral releases   CORONARY ATHERECTOMY N/A 02/26/2020  Procedure: CORONARY ATHERECTOMY;  Surgeon: Wonda Sharper, MD;  Location: [facility] INVASIVE CV LAB;  Service: Cardiovascular;  Laterality: N/A;   CYSTECTOMY     Left breast, right lumpectomy B9   INGUINAL HERNIA REPAIR     Right (Dr. Neysa)   LEFT HEART CATH AND CORONARY ANGIOGRAPHY N/A 02/22/2020   Procedure: LEFT HEART CATH AND CORONARY ANGIOGRAPHY;  Surgeon: Wonda Sharper, MD;  Location: [facility] INVASIVE CV LAB;  Service: Cardiovascular;  Laterality: N/A;   NSVD     X 2   PARATHYROIDECTOMY Right 09/23/2022   Procedure: RIGHT INFERIOR PARATHYROIDECTOMY AND RIGHT SUPERIOR  PARATHYROID  BIOPSY;  Surgeon: Eletha Boas, MD;  Location: [facility] ORS;  Service: General;  Laterality: Right;   REVERSE SHOULDER ARTHROPLASTY Right 12/18/2021   Procedure: REVERSE SHOULDER ARTHROPLASTY;  Surgeon: Sharl Selinda Dover, MD;  Location: [facility] ORS;  Service: Orthopedics;  Laterality: Right;  150   TVH with vaginal prolapse  08/29/09   Transvag Tape w/Varitensor, Ant & Post Colporraphy, Uterosacral Lig susp, McCall Culdoplasty   ueteroscopy  11/01   without laser secondary stone   Urological procedure  10/22 - 08/31/09   [facility]    Current Medications: Current Meds  Medication Sig   ACCU-CHEK AVIVA PLUS test strip CHECK BLOOD SUGAR FOUR TIMES DAILY AS NEEDED   ACCU-CHEK FASTCLIX LANCETS MISC Use to check blood sugar 4 times daily as needed.  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   aspirin  EC 81 MG tablet Take 1 tablet (81 mg total) by mouth daily.   atorvastatin  (LIPITOR) 20 MG tablet TAKE 1 TABLET BY MOUTH ONCE DAILY   B-D ULTRAFINE III SHORT PEN 31G X 8 MM MISC USE AS DIRECTED   Blood Glucose Monitoring Suppl (ACCU-CHEK AVIVA PLUS) W/DEVICE KIT Use to check blood sugar 4 times daily as needed.  Diagnosis:  E11.9  Insulin  Dependent   Cholecalciferol (VITAMIN D ) 50 MCG (2000 UT) CAPS Take 2,000 Units by mouth daily.   clopidogrel  (PLAVIX ) 75 MG tablet TAKE 1 TABLET BY MOUTH ONCE DAILY   conjugated estrogens (PREMARIN) vaginal cream Place 1 applicator vaginally every 14 (fourteen) days.   [employer] MAGNESIUM  CITRATE PO Take 250 mg by mouth once.   insulin  glargine (LANTUS  SOLOSTAR) 100 UNIT/ML Solostar Pen Inject 30-35 Units into the skin daily.   lisinopril  (ZESTRIL ) 20 MG tablet TAKE ONE TABLET BY MOUTH ONCE DAILY   metFORMIN  (GLUCOPHAGE ) 500 MG tablet TAKE ONE TABLET BY MOUTH TWICE DAILY   nitroGLYCERIN  (NITROSTAT ) 0.4 MG SL tablet Place 1 tablet (0.4 mg total) under the tongue every 5 (five) minutes as needed.   Omega-3 Fatty Acids (FISH OIL PO) Take 1 capsule by mouth daily.   polyethylene  glycol powder (GLYCOLAX/MIRALAX) 17 GM/SCOOP powder Take 8.5 g by mouth daily.   vitamin C (ASCORBIC ACID) 500 MG tablet Take 500 mg by mouth daily.     Allergies:   Sulfonamide derivatives, Ciprofloxin hcl [ciprofloxacin ], and Penicillins   ROS:   Please see the history of present illness.    All other systems reviewed and are negative.  EKGs/Labs/Other Studies Reviewed:    The following studies were reviewed today: Cardiac Studies & Procedures   ______________________________________________________________________________________________ CARDIAC CATHETERIZATION  CARDIAC CATHETERIZATION 02/26/2020  Conclusion  Prox LAD to Mid LAD lesion is 50% stenosed.  Mid Cx lesion is 40% stenosed.  3rd Mrg lesion is 80% stenosed.  Prox RCA to Mid RCA lesion is 95% stenosed.  A drug-eluting stent was successfully placed using a STENT RESOLUTE ONYX 3.5X12.  Post intervention,  there is a 0% residual stenosis.  Successful atherectomy and stenting of severe calcific stenosis of the mid right coronary artery, reducing the lesion from 95% to 0% with TIMI-3 flow both pre and post procedure.  Findings Coronary Findings Diagnostic  Dominance: Right  Left Main The vessel is severely calcified. The left main is heavily calcified with mild distal tapering but no significant stenosis the vessel divides into the LAD and left circumflex, both of which are large vessels, and there is a small intermediate branch as well.  Left Anterior Descending Prox LAD to Mid LAD lesion is 50% stenosed. The lesion is severely calcified.  Left Circumflex Mid Cx lesion is 40% stenosed. The lesion is moderately calcified.  Third Obtuse Marginal Branch 3rd Mrg lesion is 80% stenosed. The lesion is calcified. Severe branch vessel disease with heavy calcification, not amenable to PCI because of tortuosity and small caliber vessel  Right Coronary Artery Prox RCA to Mid RCA lesion is 95% stenosed. The lesion is  severely calcified. Severe calcific lesion located at the bifurcation of the mid RCA and acute marginal branch (large vessel)  Intervention  Prox RCA to Mid RCA lesion Stent CATHETER LAUNCHER 6FR JR4 SH guide catheter was inserted. Lesion crossed with guidewire using a WIRE COUGAR XT STRL 190CM. Pre-stent angioplasty was performed using a BALLOON SAPPHIRE 3.0X12. Maximum pressure:  6 atm. A drug-eluting stent was successfully placed using a STENT RESOLUTE ONYX 3.5X12. Post-stent angioplasty was performed using a BALLOON SAPPHIRE [place] 3.75X8. Maximum pressure:  16 atm. Initially, an AL 0.75 guide catheter was utilized.  This caused marked pressure dampening and we were unable to use this catheter for the procedure.  I changed out to a JR4 sidehole guide.  The patient tolerated this guide without any significant pressure change.  Heparin  was administered for anticoagulation and a therapeutic ACT is achieved.  A Viper flex wire is advanced across the lesion into the distal RCA.  Atherectomy is performed with a CSI 1.25 mm classic crown for multiple runs on low speed and high speed.  The patient tolerated this well.  Following atherectomy, the Viper wire was removed and a cougar wire is advanced into the distal RCA.  The lesion is dilated with a 3.0 mm semicompliant balloon, stented with a 3.5 x 12 mm resolute Onyx DES deployed at 12 atm, and postdilated with a 3.75 x 8 mm noncompliant balloon to 16 atm.  She tolerated the entire procedure well.  There is 0% residual stenosis and TIMI-3 flow at the completion of the procedure. Post-Intervention Lesion Assessment The intervention was successful. Pre-interventional TIMI flow is 3. Post-intervention TIMI flow is 3. No complications occurred at this lesion. There is a 0% residual stenosis post intervention.   CARDIAC CATHETERIZATION  CARDIAC CATHETERIZATION 02/22/2020  Conclusion 1.  Severe single-vessel coronary artery disease with severe calcific stenosis of  a large, dominant RCA 2.  Heavily calcified coronary arteries with multivessel calcification but nonobstructive disease in the left main, LAD, and left circumflex with the exception of severe branch vessel disease in the distal circumflex appropriate for medical therapy 3.  Normal LV function with normal LVEDP, LVEF estimated at 55 to 65%  Recommendations: The RCA is amenable to PCI.  The lesion is complex because of severe calcification and involvement of a large acute marginal branch.  Recommend start clopidogrel  300 mg x 1, then 75 mg daily.  Plan for atherectomy and stenting next week.  Plan reviewed with the patient and her husband.  Findings Coronary Findings Diagnostic  Dominance: Right  Left Main The vessel is severely calcified. The left main is heavily calcified with mild distal tapering but no significant stenosis the vessel divides into the LAD and left circumflex, both of which are large vessels, and there is a small intermediate branch as well.  Left Anterior Descending Prox LAD to Mid LAD lesion is 50% stenosed. The lesion is severely calcified.  Left Circumflex Mid Cx lesion is 40% stenosed. The lesion is moderately calcified.  Third Obtuse Marginal Branch 3rd Mrg lesion is 80% stenosed. The lesion is calcified. Severe branch vessel disease with heavy calcification, not amenable to PCI because of tortuosity and small caliber vessel  Right Coronary Artery Prox RCA to Mid RCA lesion is 95% stenosed. The lesion is severely calcified. Severe calcific lesion located at the bifurcation of the mid RCA and acute marginal branch (large vessel)  Intervention  No interventions have been documented.     ECHOCARDIOGRAM  ECHOCARDIOGRAM COMPLETE 08/13/2024  Narrative ECHOCARDIOGRAM REPORT    Patient Name:   Gloria [name] Date of Exam: 08/13/2024 Medical Rec #:  [id number]      Height:       64.0 in Accession #:    [id number]     Weight:       150.2 lb Date of Birth:   Sep 24, 1946      BSA:          1.732 m Patient Age:    67 years       BP:           143/88 mmHg Patient Gender: F              HR:           67 bpm. Exam Location:  [address]  Procedure: 2D Echo, 3D Echo, Cardiac Doppler, Color Doppler and Strain Analysis (Both Spectral and Color Flow Doppler were utilized during procedure).  Indications:    I35.0 Aortic Stenosis  History:        Patient has prior history of Echocardiogram examinations, most recent 08/23/2023. CAD, Signs/Symptoms:Murmur and Shortness of Breath; Risk Factors:Sleep Apnea, Diabetes and Hypertension.  Sonographer:    Waldo Guadalajara RCS Referring Phys: [phone] TRACI R TURNER  IMPRESSIONS   1. Left ventricular ejection fraction, by estimation, is 60 to 65%. Left ventricular ejection fraction by 3D volume is 62 %. The left ventricle has normal function. The left ventricle has no regional wall motion abnormalities. There is moderate concentric left ventricular hypertrophy. Left ventricular diastolic parameters are consistent with Grade I diastolic dysfunction (impaired relaxation). The average left ventricular global longitudinal strain is -16.3 %. 2. Right ventricular systolic function is normal. The right ventricular size is normal. There is normal pulmonary artery systolic pressure. The estimated right ventricular systolic pressure is 34.1 mmHg. 3. Left atrial size was severely dilated. 4. Interatrial septum shifted right, no definite shunt visualized by color doppler. 5. The mitral valve is normal in structure. Mild mitral valve regurgitation. No evidence of mitral stenosis. 6. The aortic valve is tricuspid. There is severe calcifcation of the aortic valve. Aortic valve regurgitation is trivial. Severe aortic valve stenosis. Aortic valve area, by VTI measures 0.81 cm. Aortic valve mean gradient measures 56.0 mmHg. 7. The inferior vena cava is normal in size with greater than 50% respiratory variability, suggesting right  atrial pressure of 3 mmHg.  FINDINGS Left Ventricle: Left ventricular ejection fraction, by estimation, is 60 to 65%. Left ventricular ejection fraction by  3D volume is 62 %. The left ventricle has normal function. The left ventricle has no regional wall motion abnormalities. The average left ventricular global longitudinal strain is -16.3 %. The left ventricular internal cavity size was normal in size. There is moderate concentric left ventricular hypertrophy. Left ventricular diastolic parameters are consistent with Grade I diastolic dysfunction (impaired relaxation).  Right Ventricle: The right ventricular size is normal. No increase in right ventricular wall thickness. Right ventricular systolic function is normal. There is normal pulmonary artery systolic pressure. The tricuspid regurgitant velocity is 2.79 m/s, and with an assumed right atrial pressure of 3 mmHg, the estimated right ventricular systolic pressure is 34.1 mmHg.  Left Atrium: Left atrial size was severely dilated.  Right Atrium: Right atrial size was normal in size.  Mitral Valve: The mitral valve is normal in structure. There is mild calcification of the mitral valve leaflet(s). Mild mitral annular calcification. Mild mitral valve regurgitation. No evidence of mitral valve stenosis.  Tricuspid Valve: The tricuspid valve is normal in structure. Tricuspid valve regurgitation is mild.  Aortic Valve: The aortic valve is tricuspid. There is severe calcifcation of the aortic valve. Aortic valve regurgitation is trivial. Severe aortic stenosis is present. Aortic valve mean gradient measures 56.0 mmHg. Aortic valve peak gradient measures 79.6 mmHg. Aortic valve area, by VTI measures 0.81 cm.  Pulmonic Valve: The pulmonic valve was normal in structure. Pulmonic valve regurgitation is trivial.  Aorta: The aortic root is normal in size and structure.  Venous: The inferior vena cava is normal in size with greater than 50%  respiratory variability, suggesting right atrial pressure of 3 mmHg.  IAS/Shunts: Interatrial septum shifted right, no definite shunt visualized by color doppler.  Additional Comments: 3D was performed not requiring image post processing on an independent workstation and was normal.   LEFT VENTRICLE PLAX 2D LVIDd:         3.50 cm         Diastology LVIDs:         2.40 cm         LV e' medial:    5.98 cm/s LV PW:         1.50 cm         LV E/e' medial:  17.6 LV IVS:        1.60 cm         LV e' lateral:   10.70 cm/s LVOT diam:     2.00 cm         LV E/e' lateral: 9.8 LV SV:         93 LV SV Index:   54              2D Longitudinal LVOT Area:     3.14 cm        Strain LV IVRT:       113 msec        2D Strain GLS   -14.4 % (A4C): 2D Strain GLS   -18.9 % (A3C): 2D Strain GLS   -15.5 % (A2C): 2D Strain GLS   -16.3 % Avg:  3D Volume EF LV 3D EF:    Left ventricul ar ejection fraction by 3D volume is 62 %.  3D Volume EF: 3D EF:        62 % LV EDV:       142 ml LV ESV:       54 ml LV SV:        88 ml  RIGHT VENTRICLE RV Basal diam:  3.30 cm    PULMONARY VEINS RV S prime:     9.14 cm/s  A Reversal Velocity: 32.20 cm/s RVSP:           34.1 mmHg  Diastolic Velocity:  36.70 cm/s S/D Velocity:        1.40 Systolic Velocity:   50.80 cm/s  LEFT ATRIUM             Index        RIGHT ATRIUM           Index LA diam:        5.50 cm 3.18 cm/m   RA Pressure: 3.00 mmHg LA Vol (A2C):   80.7 ml 46.59 ml/m  RA Area:     15.80 cm LA Vol (A4C):   92.8 ml 53.57 ml/m  RA Volume:   41.10 ml  23.73 ml/m LA Biplane Vol: 86.9 ml 50.17 ml/m AORTIC VALVE AV Area (Vmax):    0.74 cm AV Area (Vmean):   0.64 cm AV Area (VTI):     0.81 cm AV Vmax:           446.00 cm/s AV Vmean:          357.000 cm/s AV VTI:            1.150 m AV Peak Grad:      79.6 mmHg AV Mean Grad:      56.0 mmHg LVOT Vmax:         105.00 cm/s LVOT Vmean:        72.600 cm/s LVOT VTI:          0.296 m LVOT/AV  VTI ratio: 0.26  AORTA Ao Root diam: 3.10 cm Ao Asc diam:  3.40 cm  MITRAL VALVE                TRICUSPID VALVE MV Area (PHT):              TR Peak grad:   31.1 mmHg MV Decel Time:              TR Vmax:        279.00 cm/s MV E velocity: 105.00 cm/s  Estimated RAP:  3.00 mmHg MV A velocity: 139.00 cm/s  RVSP:           34.1 mmHg MV E/A ratio:  0.76 SHUNTS Systemic VTI:  0.30 m Systemic Diam: 2.00 cm  Dalton McleanMD Electronically signed by Ezra Kanner Signature Date/Time: 08/13/2024/4:33:53 PM    Final      CT SCANS  CT CORONARY MORPH W/CTA COR W/SCORE 02/15/2020  Addendum 02/15/2020  8:55 AM ADDENDUM REPORT: 02/15/2020 08:52  EXAM: OVER-READ INTERPRETATION  CT CHEST  The following report is an over-read performed by radiologist Dr. Waddell Cola [facility] on 02/15/2020. This over-read does not include interpretation of cardiac or coronary anatomy or pathology. The coronary calcium  score/coronary CTA interpretation by the cardiologist is attached.  COMPARISON:  None.  FINDINGS: Cardiovascular: Aortic atherosclerosis. Mild cardiac enlargement. No pericardial effusion.  Mediastinum/nodes: No mass or adenopathy identified.  Lungs/pleura: Scarring identified within the right upper lobe and right middle lobe.  Upper abdomen: No acute abnormality.  Musculoskeletal: Moderate multilevel thoracic degenerative disc disease with vacuum disc and ventral endplate spurring.  IMPRESSION: 1. No mass or adenopathy identified. 2. Thoracic degenerative disc disease.   Electronically Signed By: Waddell Calk M.D. On: 02/15/2020 08:52  Narrative CLINICAL DATA:  Hx of chestpain and dyspnea.  Family hx of thoracic aortic aneurysm  EXAM: Cardiac/Coronary  CTA  TECHNIQUE: The patient was scanned on a Siemens Somatoform go.Top scanner.  FINDINGS: A retrospective scan was triggered in the descending thoracic aorta. Axial non-contrast 3 mm slices were  carried out through the heart. The data set was analyzed on a dedicated work station and scored using the Agatson method. Gantry rotation speed was 330 msecs and collimation was .6 mm. 100mg  of metoprolol  po, 5mg  iv and 0.8 mg of sl NTG was given. The 3D data set was reconstructed in 5% intervals of the 60-95 % of the R-R cycle. Diastolic phases were analyzed on a dedicated work station using MPR, MIP and VRT modes. The patient received 75 cc of contrast.  Aorta: Normal size. Acending and descending aortic wall calcifications. No dissection.  Aortic Valve:  Trileaflet. mild calcifications.  Coronary Arteries:  Normal coronary origin.  Right dominance.  RCA is a large dominant artery that gives rise to PDA and PLA. There is heavily calcified plaque thoughout the vessel causing severe stenosis (>70% )  Left main is a large artery that gives rise to LAD and LCX arteries.  LAD is a large vessel that has heavily calcified plaque in the proximal, mid and distal segments causing severe stenosis (>70% )  LCX is a non-dominant artery that gives rise to 2 obtuse marginal branches. There is heavily calcified plaque in the proximal to mid segments causing severe stenosis (>70% )  Other findings:  Normal pulmonary vein drainage into the left atrium.  Normal left atrial appendage without a thrombus.  Normal size of the pulmonary artery.  IMPRESSION: 1. Coronary calcium  score of 6179. This was 24 percentile for age and sex matched control.  2. Normal ascending aorta size  3. Normal coronary origin with right dominance.  4. 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 CAD-RADS 4 Severe stenosis. (70-99% or > 50% left main). Cardiac catheterization or CT FFR is recommended. Consider symptom-guided anti-ischemic pharmacotherapy as well as risk factor modification per guideline directed care. Additional analysis with CT FFR will  be submitted and reported separately.  Electronically Signed: By: Redell Cave M.D. On: 02/14/2020 17:17     ______________________________________________________________________________________________      EKG:        Recent Labs: 02/13/2024: TSH 0.48 08/21/2024: ALT 17; BUN 13; Creatinine, Ser 0.55; Hemoglobin 14.8; Platelets 208.0; Potassium 4.1; Sodium 138  Recent Lipid Panel    Component Value Date/Time   CHOL 116 02/13/2024 0758   CHOL 109 05/21/2021 0945   TRIG 75.0 02/13/2024 0758   HDL 34.30 (L) 02/13/2024 0758   HDL 36 (L) 05/21/2021 0945   CHOLHDL 3 02/13/2024 0758   VLDL 15.0 02/13/2024 0758   LDLCALC 67 02/13/2024 0758   LDLCALC 55 05/21/2021 0945        Physical Exam:    VS:  BP (!) 158/7   Pulse 82   Ht 5' 4 (1.626 m)   Wt 153 lb 12.8 oz (69.8 kg)   LMP 08/08/2009   SpO2 96%   BMI 26.40 kg/m     Wt Readings from Last 3 Encounters:  09/17/24 153 lb 12.8 oz (69.8 kg)  08/21/24 149 lb 3.2 oz (67.7 kg)  02/20/24 150 lb 3.2 oz (68.1 kg)     GEN:  Well nourished, well developed in no acute distress HEENT: Normal NECK: No JVD; No carotid bruits LYMPHATICS: No lymphadenopathy CARDIAC: RRR, 3/6 harsh crescendo decrescendo murmur at  the right upper sternal border RESPIRATORY:  Clear to auscultation without rales, wheezing or rhonchi  ABDOMEN: Soft, non-tender, non-distended MUSCULOSKELETAL:  No edema; No deformity  SKIN: Warm and dry NEUROLOGIC:  Alert and oriented x 3 PSYCHIATRIC:  Normal affect   Assessment & Plan Nonrheumatic aortic (valve) stenosis The patient has severe, stage D1 aortic stenosis.  Her echo demonstrates fairly marked progression of her aortic stenosis now with a mean transvalvular gradient greater than 55 mmHg consistent with very severe aortic stenosis.  I reviewed her echo images and she has severely calcified, restricted aortic valve leaflets.  Doppler data reveals very severe aortic stenosis with a mean gradient 56  mmHg and calculated aortic valve area ranging from 0.64 to 0.81 cm depending on methodology.  Pre-procedure lab exam  I have reviewed the natural history of aortic stenosis with the patient and their family members who are present today. We have discussed the limitations of medical therapy and the poor prognosis associated with symptomatic aortic stenosis. We have reviewed potential treatment options, including palliative medical therapy, conventional surgical aortic valve replacement, and transcatheter aortic valve replacement. We discussed treatment options in the context of the patient's specific comorbid medical conditions.     The patient understands the need for further evaluation which would include cardiac catheterization with coronary angiography to reevaluate her coronary artery disease.  With her severely elevated coronary calcium  score, known calcific nonobstructive plaquing in the left main, LAD, and left circumflex, and previous RCA stenting, catheter angiography will be necessary. I have reviewed the risks, indications, and alternatives to cardiac catheterization, possible angioplasty, and stenting with the patient. Risks include but are not limited to bleeding, infection, vascular injury, stroke, myocardial infection, arrhythmia, kidney injury, radiation-related injury in the case of prolonged fluoroscopy use, emergency cardiac surgery, and death. The patient understands the risks of serious complication is 1-2 in 1000 with diagnostic cardiac cath and 1-2% or less with angioplasty/stenting.  She will also be referred for a gated cardiac CTA and a CTA of the chest, abdomen, and pelvis for TAVR planning.  Once her studies are completed, she will be referred for formal cardiac surgical consultation as part of a multidisciplinary approach to her care.  Informed Consent   Shared Decision Making/Informed Consent The risks [stroke (1 in 1000), death (1 in 1000), kidney failure [usually temporary]  (1 in 500), bleeding (1 in 200), allergic reaction [possibly serious] (1 in 200)], benefits (diagnostic support and management of coronary artery disease) and alternatives of a cardiac catheterization were discussed in detail with Ms. Ellenburg and she is willing to proceed.      Medication Adjustments/Labs and Tests Ordered: Current medicines are reviewed at length with the patient today.  Concerns regarding medicines are outlined above.  Orders Placed This Encounter  Procedures   Basic metabolic panel with GFR   CBC   No orders of the defined types were placed in this encounter.   Patient Instructions  Medication Instructions:  ONCE- Take Metoprolol  Tartrate (Lopressor ) 50 mg once prior to your CT scan on 09/21/24  *If you need a refill on your cardiac medications before your next appointment, please call your pharmacy*  Lab Work: To be completed today: BMP and CBC  If you have labs (blood work) drawn today and your tests are completely normal, you will receive your results only by: MyChart Message (if you have MyChart) OR A paper copy in the mail If you have any lab test that is abnormal or we need  to change your treatment, we will call you to review the results.  Testing/Procedures: Your provider has requested that you have a CT scan as part of your TAVR work-up. You are scheduled for 09/21/24 at [address] [place], [place] [address]. You will go to the 2nd floor.  Your physician has requested that you have a cardiac catheterization. Cardiac catheterization is used to diagnose and/or treat various heart conditions. Doctors may recommend this procedure for a number of different reasons. The most common reason is to evaluate chest pain. Chest pain can be a symptom of coronary artery disease (CAD), and cardiac catheterization can show whether plaque is narrowing or blocking your heart's arteries. This procedure is also used to evaluate the valves, as well as measure the blood flow and  oxygen levels in different parts of your heart. For further information please visit [email or web address] Please follow instruction sheet, as given.   Follow-Up: At [facility], you and your health needs are our priority.  As part of our continuing mission to provide you with exceptional heart care, our providers are all part of one team.  This team includes your primary Cardiologist (physician) and Advanced Practice Providers or APPs (Physician Assistants and Nurse Practitioners) who all work together to provide you with the care you need, when you need it.  Your next appointment:   Per structural heart team  Provider:   Ozell Fell, MD or Izetta Hummer, PA-C    We recommend signing up for the patient portal called MyChart.  Sign up information is provided on this After Visit Summary.  MyChart is used to connect with patients for Virtual Visits (Telemedicine).  Patients are able to view lab/test results, encounter notes, upcoming appointments, etc.  Non-urgent messages can be sent to your provider as well.   To learn more about what you can do with MyChart, go to [email or web address].   Other Instructions       Cardiac/Peripheral Catheterization   You are scheduled for a Cardiac Catheterization on Wednesday, November 26 with Dr. Ozell Fell.  1. Please arrive at the [facility] (Main Entrance A) at [facility]: [address] [place], [place] [address] at 8:00 AM (This time is 2 hour(s) before your procedure to ensure your preparation). Your procedure is scheduled to begin at 10 AM.  Free valet parking service is available. You will check in at ADMITTING. The support person will be asked to wait in the waiting room.  It is OK to have someone drop you off and come back when you are ready to be discharged.        Special note: Every effort is made to have your procedure done on time. Please understand that emergencies sometimes delay scheduled  procedures.  2. Diet: Nothing to eat after midnight.  3. Hydration:You need to be well hydrated before your procedure. On November 26, you may drink approved liquids (see below) until 2 hours before the procedure, with 16 oz of water  as your last intake.   List of approved liquids water , clear juice, clear tea, black coffee, fruit juices, non-citric and without pulp, carbonated beverages, Gatorade, Kool -Aid, plain Jello-O and plain ice popsicles.  4. Labs: You will need to have blood drawn on Monday, November 10 at [facility] (1st Floor), [address] [place], [place] [address]. You do not need to be fasting.  5. Medication instructions in preparation for your procedure:   Contrast Allergy: No  Stop taking, Lisinopril  (Zestril  or Prinivil )  on 10/02/24 (last dose to be taken on 10/02/24)  Take only 14 units of insulin  the night before your procedure. Do not take any insulin  on the day of the procedure.  Do not take Diabetes Med Glucophage  (Metformin ) on the day of the procedure and HOLD 48 HOURS AFTER THE PROCEDURE.  On the morning of your procedure, take Aspirin  81 mg and Plavix /Clopidogrel  and any morning medicines NOT listed above.  You may use sips of water .  6. Plan to go home the same day, you will only stay overnight if medically necessary. 7. You MUST have a responsible adult to drive you home. 8. An adult MUST be with you the first 24 hours after you arrive home. 9. Bring a current list of your medications, and the last time and date medication taken. 10. Bring ID and current insurance cards. 11.Please wear clothes that are easy to get on and off and wear slip-on shoes.  Thank you for allowing us  to care for you!   -- [facility] Invasive Cardiovascular services     Signed, Ozell Fell, MD  09/17/2024 4:26 PM    [facility]

## 2024-09-17 NOTE — Patient Instructions (Addendum)
 Medication Instructions:  ONCE- Take Metoprolol  Tartrate (Lopressor ) 50 mg once prior to your CT scan on 09/21/24  *If you need a refill on your cardiac medications before your next appointment, please call your pharmacy*  Lab Work: To be completed today: BMP and CBC  If you have labs (blood work) drawn today and your tests are completely normal, you will receive your results only by: MyChart Message (if you have MyChart) OR A paper copy in the mail If you have any lab test that is abnormal or we need to change your treatment, we will call you to review the results.  Testing/Procedures: Your provider has requested that you have a CT scan as part of your TAVR work-up. You are scheduled for 09/21/24 at [address] [place], [place] [address]. You will go to the 2nd floor.  Your physician has requested that you have a cardiac catheterization. Cardiac catheterization is used to diagnose and/or treat various heart conditions. Doctors may recommend this procedure for a number of different reasons. The most common reason is to evaluate chest pain. Chest pain can be a symptom of coronary artery disease (CAD), and cardiac catheterization can show whether plaque is narrowing or blocking your heart's arteries. This procedure is also used to evaluate the valves, as well as measure the blood flow and oxygen levels in different parts of your heart. For further information please visit [email or web address] Please follow instruction sheet, as given.   Follow-Up: At [facility], you and your health needs are our priority.  As part of our continuing mission to provide you with exceptional heart care, our providers are all part of one team.  This team includes your primary Cardiologist (physician) and Advanced Practice Providers or APPs (Physician Assistants and Nurse Practitioners) who all work together to provide you with the care you need, when you need it.  Your next appointment:   Per structural  heart team  Provider:   Ozell Fell, MD or Izetta Hummer, PA-C    We recommend signing up for the patient portal called MyChart.  Sign up information is provided on this After Visit Summary.  MyChart is used to connect with patients for Virtual Visits (Telemedicine).  Patients are able to view lab/test results, encounter notes, upcoming appointments, etc.  Non-urgent messages can be sent to your provider as well.   To learn more about what you can do with MyChart, go to [email or web address].   Other Instructions       Cardiac/Peripheral Catheterization   You are scheduled for a Cardiac Catheterization on Wednesday, November 26 with Dr. Ozell Fell.  1. Please arrive at the [facility] (Main Entrance A) at [facility]: [address] [place], [place] [address] at 8:00 AM (This time is 2 hour(s) before your procedure to ensure your preparation). Your procedure is scheduled to begin at 10 AM.  Free valet parking service is available. You will check in at ADMITTING. The support person will be asked to wait in the waiting room.  It is OK to have someone drop you off and come back when you are ready to be discharged.        Special note: Every effort is made to have your procedure done on time. Please understand that emergencies sometimes delay scheduled procedures.  2. Diet: Nothing to eat after midnight.  3. Hydration:You need to be well hydrated before your procedure. On November 26, you may drink approved liquids (see below) until 2 hours before the procedure, with 16  oz of water  as your last intake.   List of approved liquids water , clear juice, clear tea, black coffee, fruit juices, non-citric and without pulp, carbonated beverages, Gatorade, Kool -Aid, plain Jello-O and plain ice popsicles.  4. Labs: You will need to have blood drawn on Monday, November 10 at [facility] (1st Floor), [address], [place], [place]  [address]. You do not need to be fasting.  5. Medication instructions in preparation for your procedure:   Contrast Allergy: No   Stop taking, Lisinopril  (Zestril  or Prinivil )  on 10/02/24 (last dose to be taken on 10/02/24)  Take only 14 units of insulin  the night before your procedure. Do not take any insulin  on the day of the procedure.  Do not take Diabetes Med Glucophage  (Metformin ) on the day of the procedure and HOLD 48 HOURS AFTER THE PROCEDURE.  On the morning of your procedure, take Aspirin  81 mg and Plavix /Clopidogrel  and any morning medicines NOT listed above.  You may use sips of water .  6. Plan to go home the same day, you will only stay overnight if medically necessary. 7. You MUST have a responsible adult to drive you home. 8. An adult MUST be with you the first 24 hours after you arrive home. 9. Bring a current list of your medications, and the last time and date medication taken. 10. Bring ID and current insurance cards. 11.Please wear clothes that are easy to get on and off and wear slip-on shoes.  Thank you for allowing us  to care for you!   -- [place] Invasive Cardiovascular services

## 2024-09-17 NOTE — Progress Notes (Signed)
 Pre Surgical Assessment: 5 M Walk Test  73M=16.53ft  5 Meter Walk Test- trial 1: 4.66 seconds 5 Meter Walk Test- trial 2: 4.71 seconds 5 Meter Walk Test- trial 3: 5.31 seconds 5 Meter Walk Test Average: 4.90 seconds

## 2024-09-18 LAB — CBC
Hematocrit: 42.7 % (ref 34.0–46.6)
Hemoglobin: 14.2 g/dL (ref 11.1–15.9)
MCH: 30.9 pg (ref 26.6–33.0)
MCHC: 33.3 g/dL (ref 31.5–35.7)
MCV: 93 fL (ref 79–97)
Platelets: 193 x10E3/uL (ref 150–450)
RBC: 4.6 x10E6/uL (ref 3.77–5.28)
RDW: 12.7 % (ref 11.7–15.4)
WBC: 5 x10E3/uL (ref 3.4–10.8)

## 2024-09-18 LAB — BASIC METABOLIC PANEL WITH GFR
BUN/Creatinine Ratio: 24 (ref 12–28)
BUN: 14 mg/dL (ref 8–27)
CO2: 23 mmol/L (ref 20–29)
Calcium: 10.1 mg/dL (ref 8.7–10.3)
Chloride: 105 mmol/L (ref 96–106)
Creatinine, Ser: 0.58 mg/dL (ref 0.57–1.00)
Glucose: 136 mg/dL — AB (ref 70–99)
Potassium: 4.2 mmol/L (ref 3.5–5.2)
Sodium: 140 mmol/L (ref 134–144)
eGFR: 92 mL/min/1.73 (ref >59)

## 2024-09-21 ENCOUNTER — Ambulatory Visit (HOSPITAL_COMMUNITY)
Admission: RE | Admit: 2024-09-21 | Discharge: 2024-09-21 | Disposition: A | Discharge status: Home / Self Care | Source: Ambulatory Visit | Attending: Cardiology | Admitting: Cardiology

## 2024-09-21 DIAGNOSIS — I350 Nonrheumatic aortic (valve) stenosis: Secondary | ICD-10-CM | POA: Diagnosis present

## 2024-09-21 DIAGNOSIS — I708 Atherosclerosis of other arteries: Secondary | ICD-10-CM | POA: Diagnosis not present

## 2024-09-21 DIAGNOSIS — Z01810 Encounter for preprocedural cardiovascular examination: Secondary | ICD-10-CM | POA: Diagnosis not present

## 2024-09-21 DIAGNOSIS — I2510 Atherosclerotic heart disease of native coronary artery without angina pectoris: Secondary | ICD-10-CM | POA: Diagnosis not present

## 2024-09-21 MED ORDER — IOHEXOL 350 MG/ML SOLN
100.0000 mL | Freq: Once | INTRAVENOUS | Status: AC | PRN
  Administered 2024-09-21: 100 mL via INTRAVENOUS

## 2024-09-21 NOTE — Progress Notes (Signed)
 Procedure Type: Isolated AVR Perioperative Outcome Estimate % Operative Mortality 3.41% Morbidity & Mortality 13.7% Stroke 2.42% Renal Failure 1.45% Reoperation 3.67% Prolonged Ventilation 4.74% Deep Sternal Wound Infection 0.068% Long Hospital Stay (>14 days) 5.92% Short Hospital Stay (<6 days)* 31.7%

## 2024-09-23 ENCOUNTER — Ambulatory Visit: Payer: Self-pay | Admitting: Cardiovascular Disease

## 2024-10-03 ENCOUNTER — Ambulatory Visit (HOSPITAL_COMMUNITY)
Admission: RE | Admit: 2024-10-03 | Discharge: 2024-10-03 | Disposition: A | Discharge status: Home / Self Care | Attending: Cardiovascular Disease | Admitting: Cardiovascular Disease

## 2024-10-03 ENCOUNTER — Encounter (HOSPITAL_COMMUNITY)
Admission: RE | Disposition: A | Discharge status: Home / Self Care | Payer: Self-pay | Source: Home / Self Care | Attending: Cardiovascular Disease

## 2024-10-03 ENCOUNTER — Other Ambulatory Visit: Payer: Self-pay

## 2024-10-03 DIAGNOSIS — Z955 Presence of coronary angioplasty implant and graft: Secondary | ICD-10-CM | POA: Diagnosis not present

## 2024-10-03 DIAGNOSIS — Z7982 Long term (current) use of aspirin: Secondary | ICD-10-CM | POA: Insufficient documentation

## 2024-10-03 DIAGNOSIS — I2584 Coronary atherosclerosis due to calcified coronary lesion: Secondary | ICD-10-CM | POA: Insufficient documentation

## 2024-10-03 DIAGNOSIS — E119 Type 2 diabetes mellitus without complications: Secondary | ICD-10-CM | POA: Diagnosis not present

## 2024-10-03 DIAGNOSIS — I2510 Atherosclerotic heart disease of native coronary artery without angina pectoris: Secondary | ICD-10-CM | POA: Insufficient documentation

## 2024-10-03 DIAGNOSIS — Z7902 Long term (current) use of antithrombotics/antiplatelets: Secondary | ICD-10-CM | POA: Insufficient documentation

## 2024-10-03 DIAGNOSIS — G4730 Sleep apnea, unspecified: Secondary | ICD-10-CM | POA: Diagnosis not present

## 2024-10-03 DIAGNOSIS — Z79899 Other long term (current) drug therapy: Secondary | ICD-10-CM | POA: Insufficient documentation

## 2024-10-03 DIAGNOSIS — I350 Nonrheumatic aortic (valve) stenosis: Secondary | ICD-10-CM | POA: Diagnosis not present

## 2024-10-03 DIAGNOSIS — E782 Mixed hyperlipidemia: Secondary | ICD-10-CM | POA: Diagnosis not present

## 2024-10-03 DIAGNOSIS — Z7984 Long term (current) use of oral hypoglycemic drugs: Secondary | ICD-10-CM | POA: Diagnosis not present

## 2024-10-03 DIAGNOSIS — I119 Hypertensive heart disease without heart failure: Secondary | ICD-10-CM | POA: Insufficient documentation

## 2024-10-03 DIAGNOSIS — Z794 Long term (current) use of insulin: Secondary | ICD-10-CM | POA: Insufficient documentation

## 2024-10-03 HISTORY — PX: LEFT HEART CATH AND CORONARY ANGIOGRAPHY: CATH118249

## 2024-10-03 LAB — GLUCOSE, CAPILLARY
Glucose-Capillary: 165 mg/dL — ABNORMAL HIGH (ref 70–99)
Glucose-Capillary: 131 mg/dL — ABNORMAL HIGH (ref 70–99)

## 2024-10-03 SURGERY — LEFT HEART CATH AND CORONARY ANGIOGRAPHY
Anesthesia: LOCAL

## 2024-10-03 MED ORDER — HEPARIN SODIUM (PORCINE) 1000 UNIT/ML IJ SOLN
INTRAMUSCULAR | Status: AC
  Filled 2024-10-03: qty 10

## 2024-10-03 MED ORDER — FENTANYL CITRATE (PF) 100 MCG/2ML IJ SOLN
INTRAMUSCULAR | Status: DC | PRN
  Administered 2024-10-03: 25 ug via INTRAVENOUS

## 2024-10-03 MED ORDER — FENTANYL CITRATE (PF) 100 MCG/2ML IJ SOLN
INTRAMUSCULAR | Status: AC
  Filled 2024-10-03: qty 2

## 2024-10-03 MED ORDER — IOHEXOL 350 MG/ML SOLN
INTRAVENOUS | Status: DC | PRN
  Administered 2024-10-03: 40 mL

## 2024-10-03 MED ORDER — LIDOCAINE HCL (PF) 1 % IJ SOLN
INTRAMUSCULAR | Status: AC
  Filled 2024-10-03: qty 30

## 2024-10-03 MED ORDER — MIDAZOLAM HCL 2 MG/2ML IJ SOLN
INTRAMUSCULAR | Status: AC
  Filled 2024-10-03: qty 2

## 2024-10-03 MED ORDER — LIDOCAINE HCL (PF) 1 % IJ SOLN
INTRAMUSCULAR | Status: DC | PRN
  Administered 2024-10-03: 5 mL

## 2024-10-03 MED ORDER — ASPIRIN 81 MG PO CHEW: 81.0000 mg | CHEWABLE_TABLET | ORAL | Status: DC

## 2024-10-03 MED ORDER — SODIUM CHLORIDE 0.9% FLUSH: 3.0000 mL | Freq: Two times a day (BID) | INTRAVENOUS | Status: DC

## 2024-10-03 MED ORDER — MIDAZOLAM HCL (PF) 2 MG/2ML IJ SOLN
INTRAMUSCULAR | Status: DC | PRN
  Administered 2024-10-03: 1 mg via INTRAVENOUS

## 2024-10-03 MED ORDER — VERAPAMIL HCL 2.5 MG/ML IV SOLN
INTRAVENOUS | Status: AC
  Filled 2024-10-03: qty 2

## 2024-10-03 MED ORDER — HEPARIN (PORCINE) IN NACL 2000-0.9 UNIT/L-% IV SOLN
INTRAVENOUS | Status: DC | PRN
  Administered 2024-10-03: 1000 mL

## 2024-10-03 MED ORDER — VERAPAMIL HCL 2.5 MG/ML IV SOLN
INTRAVENOUS | Status: DC | PRN
  Administered 2024-10-03: 10 mL via INTRA_ARTERIAL

## 2024-10-03 MED ORDER — SODIUM CHLORIDE 0.9% FLUSH: 3.0000 mL | INTRAVENOUS | Status: DC | PRN

## 2024-10-03 MED ORDER — FREE WATER: 500.0000 mL | Freq: Once | Status: DC

## 2024-10-03 MED ORDER — SODIUM CHLORIDE 0.9 % IV SOLN: 250.0000 mL | INTRAVENOUS | Status: DC | PRN

## 2024-10-03 MED ORDER — HEPARIN SODIUM (PORCINE) 1000 UNIT/ML IJ SOLN
INTRAMUSCULAR | Status: DC | PRN
Start: 2024-10-03 — End: 2024-10-03
  Administered 2024-10-03: 3000 [IU] via INTRAVENOUS

## 2024-10-03 SURGICAL SUPPLY — 11 items
CATH 5FR JL3.5 JR4 ANG PIG MP (CATHETERS) IMPLANT
CATH INFINITI MULTIPACK ANG 4F (CATHETERS) IMPLANT
DEVICE RAD COMP TR BAND LRG (VASCULAR PRODUCTS) IMPLANT
GLIDESHEATH SLEND SS 6F .021 (SHEATH) IMPLANT
GUIDEWIRE INQWIRE 1.5J.035X260 (WIRE) IMPLANT
GUIDEWIRE TIGER .035X300 (WIRE) IMPLANT
KIT SYRINGE INJ CVI SPIKEX1 (MISCELLANEOUS) IMPLANT
PACK CARDIAC CATHETERIZATION (CUSTOM PROCEDURE TRAY) ×1 IMPLANT
SET ATX-X65L (MISCELLANEOUS) IMPLANT
SHEATH PROBE COVER 6X72 (BAG) IMPLANT
STATION PROTECTION PRESSURIZED (MISCELLANEOUS) IMPLANT

## 2024-10-03 NOTE — Discharge Instructions (Signed)
 Radial Site Care The following information offers guidance on how to care for yourself after your procedure. Your health care provider may also give you more specific instructions. If you have problems or questions, contact your health care provider. What can I expect after the procedure? After the procedure, it is common to have bruising and tenderness in the incision area. Follow these instructions at home: Incision site care  Follow instructions from your health care provider about how to take care of your incision site. Make sure you: Wash your hands with soap and water for at least 20 seconds before and after you change your bandage (dressing). If soap and water are not available, use hand sanitizer. Remove your dressing in 24 hours. Leave stitches (sutures), skin glue, or adhesive strips in place. These skin closures may need to stay in place for 2 weeks or longer. If adhesive strip edges start to loosen and curl up, you may trim the loose edges. Do not remove adhesive strips completely unless your health care provider tells you to do that. Do not take baths, swim, or use a hot tub for at least 1 week. You may shower 24 hours after the procedure or as told by your health care provider. Remove the dressing and gently wash the incision area with plain soap and water. Pat the area dry with a clean towel. Do not rub the site. That could cause bleeding. Do not apply powder or lotion to the site. Check your incision site every day for signs of infection. Check for: Redness, swelling, or pain. Fluid or blood. Warmth. Pus or a bad smell. Activity For 24 hours after the procedure, or as directed by your health care provider: Do not flex or bend the affected arm. Do not push or pull heavy objects with the affected arm. Do not operate machinery or power tools. Do not drive. You should not drive yourself home from the hospital or clinic if you go home during that time period. You may drive 24  hours after the procedure unless your health care provider tells you not to. Do not lift anything that is heavier than 10 lb (4.5 kg), or the limit that you are told, until your health care provider says that it is safe. Return to your normal activities as told by your health care provider. Ask your health care provider what activities are safe for you and when you can return to work. If you were given a sedative during the procedure, it can affect you for several hours. Do not drive or operate machinery until your health care provider says that it is safe. General instructions Take over-the-counter and prescription medicines only as told by your health care provider. If you will be going home right after the procedure, plan to have a responsible adult care for you for the time you are told. This is important. Keep all follow-up visits. This is important. Contact a health care provider if: You have a fever or chills. You have any of these signs of infection at your incision site: Redness, swelling, or pain. Fluid or blood. Warmth. Pus or a bad smell. Get help right away if: The incision area swells very fast. The incision area is bleeding, and the bleeding does not stop when you hold steady pressure on the area. Your arm or hand becomes pale, cool, tingly, or numb. These symptoms may represent a serious problem that is an emergency. Do not wait to see if the symptoms will go away. Get medical  help right away. Call your local emergency services (911 in the [place] Do not drive yourself to the hospital. Summary After the procedure, it is common to have bruising and tenderness at the incision site. Follow instructions from your health care provider about how to take care of your radial site incision. Check the incision every day for signs of infection. Do not lift anything that is heavier than 10 lb (4.5 kg), or the limit that you are told, until your health care provider says that it is  safe. Get help right away if the incision area swells very fast, you have bleeding at the incision site that will not stop, or your arm or hand becomes pale, cool, or numb. This information is not intended to replace advice given to you by your health care provider. Make sure you discuss any questions you have with your health care provider. Document Revised: 12/14/2020 Document Reviewed: 12/14/2020 Elsevier Patient Education  2024 [employer].

## 2024-10-03 NOTE — Progress Notes (Signed)
 Discharge instructions reviewed with patient and husband at bedside. Denies questions concerns. PT tolerated PO intake. Ambulated in the hallway, was able to void without difficulty. Seen by MD incision site remains clean dry and intact. No s/s of complications. PT escorted from the unit via wheel chair to personal vehicle.

## 2024-10-03 NOTE — Interval H&P Note (Signed)
 History and Physical Interval Note:  10/03/2024 9:19 AM  Gloria [name]  has presented today for surgery, with the diagnosis of aortic stenosis.  The various methods of treatment have been discussed with the patient and family. After consideration of risks, benefits and other options for treatment, the patient has consented to  Procedure(s): LEFT HEART CATH AND CORONARY ANGIOGRAPHY (N/A) as a surgical intervention.  The patient's history has been reviewed, patient examined, no change in status, stable for surgery.  I have reviewed the patient's chart and labs.  Questions were answered to the patient's satisfaction.     Ozell Fell

## 2024-10-04 ENCOUNTER — Encounter (HOSPITAL_COMMUNITY): Payer: Self-pay | Admitting: Cardiovascular Disease

## 2024-10-12 ENCOUNTER — Other Ambulatory Visit: Payer: Self-pay | Admitting: Family Medicine

## 2024-10-12 DIAGNOSIS — E119 Type 2 diabetes mellitus without complications: Secondary | ICD-10-CM

## 2024-10-17 ENCOUNTER — Ambulatory Visit (INDEPENDENT_AMBULATORY_CARE_PROVIDER_SITE_OTHER): Admitting: *Deleted

## 2024-10-17 ENCOUNTER — Other Ambulatory Visit: Payer: Self-pay

## 2024-10-17 VITALS — Ht 64.0 in | Wt 150.0 lb

## 2024-10-17 DIAGNOSIS — Z0000 Encounter for general adult medical examination without abnormal findings: Secondary | ICD-10-CM | POA: Diagnosis not present

## 2024-10-17 DIAGNOSIS — I350 Nonrheumatic aortic (valve) stenosis: Secondary | ICD-10-CM

## 2024-10-17 NOTE — Progress Notes (Signed)
 Chief Complaint  Patient presents with   Medicare Wellness     Subjective:   Gloria [name] is a 78 y.o. female who presents for a Medicare Annual Wellness Visit.  No voiced or noted concerns at this time Patient advised to keep follow-up appointment with PCP (11-14-2023)    Visit info / Clinical Intake: Medicare Wellness Visit Type:: Subsequent Annual Wellness Visit Persons participating in visit and providing information:: patient Medicare Wellness Visit Mode:: Telephone If telephone:: video declined Since this visit was completed virtually, some vitals may be partially provided or unavailable. Missing vitals are due to the limitations of the virtual format.: Unable to obtain vitals - no equipment If Telephone or Video please confirm:: I connected with patient using audio/video enable telemedicine. I verified patient identity with two identifiers, discussed telehealth limitations, and patient agreed to proceed. Patient Location:: home Provider Location:: home Interpreter Needed?: No Pre-visit prep was completed: no AWV questionnaire completed by patient prior to visit?: yes Date:: 10/15/24 Living arrangements:: lives with spouse/significant other Patient's Overall Health Status Rating: (!) fair Typical amount of pain: some Does pain affect daily life?: no Are you currently prescribed opioids?: no  Dietary Habits and Nutritional Risks How many meals a day?: 3 Eats fruit and vegetables daily?: yes Most meals are obtained by: preparing own meals Diabetic:: (!) yes Any non-healing wounds?: no How often do you check your BS?: 2 Would you like to be referred to a Nutritionist or for Diabetic Management? : no  Functional Status Activities of Daily Living (to include ambulation/medication): Independent Ambulation: Independent Medication Administration: Independent Home Management (perform basic housework or laundry): Independent Manage your own finances?: yes Primary  transportation is: driving Concerns about vision?: no *vision screening is required for WTM* Concerns about hearing?: no  Fall Screening Falls in the past year?: 1 Number of falls in past year: 0 Was there an injury with Fall?: 0 Fall Risk Category Calculator: 1 Patient Fall Risk Level: Low Fall Risk  Fall Risk Patient at Risk for Falls Due to: No Fall Risks Fall risk Follow up: Falls evaluation completed; Education provided; Falls prevention discussed  Home and Transportation Safety: All rugs have non-skid backing?: yes All stairs or steps have railings?: yes Grab bars in the bathtub or shower?: yes Have non-skid surface in bathtub or shower?: (!) no Good home lighting?: yes Regular seat belt use?: yes Hospital stays in the last year:: (!) yes How many hospital stays:: 1  Cognitive Assessment Difficulty concentrating, remembering, or making decisions? : no Will 6CIT or Mini Cog be Completed: yes What year is it?: 0 points What month is it?: 0 points Give patient an address phrase to remember (5 components): Its very sunny outside today in December About what time is it?: 0 points Count backwards from 20 to 1: 0 points Say the months of the year in reverse: 0 points Repeat the address phrase from earlier: 0 points 6 CIT Score: 0 points  Advance Directives (For Healthcare) Does Patient Have a Medical Advance Directive?: Yes Type of Advance Directive: Healthcare Power of [place]; Living will Copy of Healthcare Power of Attorney in Chart?: Yes - validated most recent copy scanned in chart (See row information) Copy of Living Will in Chart?: No - copy requested  Reviewed/Updated  Reviewed/Updated: Reviewed All (Medical, Surgical, Family, Medications, Allergies, Care Teams, Patient Goals); Surgical History; Family History; Medications; Allergies; Care Teams; Patient Goals; Medical History    Allergies (verified) Sulfonamide derivatives, Ciprofloxin hcl [ciprofloxacin ],  and Penicillins  Current Medications (verified) Outpatient Encounter Medications as of 10/17/2024  Medication Sig   ACCU-CHEK AVIVA PLUS test strip CHECK BLOOD SUGAR FOUR TIMES DAILY AS NEEDED   ACCU-CHEK FASTCLIX LANCETS MISC Use to check blood sugar 4 times daily as needed.  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   aspirin  EC 81 MG tablet Take 1 tablet (81 mg total) by mouth daily.   atorvastatin  (LIPITOR) 20 MG tablet TAKE 1 TABLET BY MOUTH ONCE DAILY   Blood Glucose Monitoring Suppl (ACCU-CHEK AVIVA PLUS) W/DEVICE KIT Use to check blood sugar 4 times daily as needed.  Diagnosis:  E11.9  Insulin  Dependent   Cholecalciferol (VITAMIN D ) 50 MCG (2000 UT) CAPS Take 2,000 Units by mouth daily.   clopidogrel  (PLAVIX ) 75 MG tablet TAKE 1 TABLET BY MOUTH ONCE DAILY   conjugated estrogens (PREMARIN) vaginal cream Place 1 applicator vaginally once a week.   [employer] MAGNESIUM  CITRATE PO Take 240 mg by mouth daily.   insulin  glargine (LANTUS  SOLOSTAR) 100 UNIT/ML Solostar Pen Inject 30-35 Units into the skin daily.   Insulin  Pen Needle (BD PEN NEEDLE SHORT ULTRAFINE) 31G X 8 MM MISC Use as directed to inject insulin  daily   lisinopril  (ZESTRIL ) 20 MG tablet TAKE ONE TABLET BY MOUTH ONCE DAILY   metFORMIN  (GLUCOPHAGE ) 500 MG tablet TAKE ONE TABLET BY MOUTH TWICE DAILY   nitroGLYCERIN  (NITROSTAT ) 0.4 MG SL tablet Place 1 tablet (0.4 mg total) under the tongue every 5 (five) minutes as needed.   Omega-3 Fatty Acids (FISH OIL) 1000 MG CAPS Take 1,000 mg by mouth daily.   polyethylene glycol powder (GLYCOLAX /MIRALAX ) 17 GM/SCOOP powder Take 8.5 g by mouth daily.   vitamin C (ASCORBIC ACID) 500 MG tablet Take 500 mg by mouth daily.   No facility-administered encounter medications on file as of 10/17/2024.    History: Past Medical History:  Diagnosis Date   Advance directive discussed with patient 01/03/2012   12/2011-AD d/w pt.  She has talked to husband but doesn't have living will yet.  Full code in  discussion with patient today.  Would not want prolonged interventions if she were profoundly and/or permanently impaired, ie severe dementia or a condition with no hope of improvement.   01/13/15-Advance directive d/w pt- husband designated if patient were incapacitated.     Allergic rhinitis    Aortic stenosis    Moderate by echo 08/2022   Arthritis    Asthma    as a chld   Complication of anesthesia    Coronary artery disease    Diabetes mellitus without complication (HCC) 05/24/2007   Qualifier: Diagnosis of  By: Bartley MD, Lamar Mulch    Female cystocele 08/10/2012   Health care maintenance 02/09/2018   Heart murmur    Hemorrhoids    History of kidney stones    Hypertension    Left shoulder pain 07/20/2016   Medicare annual wellness visit, subsequent 01/03/2012   MENOPAUSE, SURGICAL 12/16/2009   Qualifier: Diagnosis of  By: Henrietta LPN, Regina     Pure hypercholesterolemia 03/30/2011   RHINITIS 12/24/2010   Qualifier: Diagnosis of  By: Cleatus MD, Arlyss     Sleep apnea    wears mouthpiece   Snoring 01/02/2020   Past Surgical History:  Procedure Laterality Date   ABDOMINAL HYSTERECTOMY     BASAL CELL CARCINOMA EXCISION  08/23/2017   left upper arm   CARPAL TUNNEL RELEASE     with bilateral releases   CORONARY ATHERECTOMY N/A 02/26/2020   Procedure: CORONARY ATHERECTOMY;  Surgeon: Wonda Sharper, MD;  Location: [facility] INVASIVE CV LAB;  Service: Cardiovascular;  Laterality: N/A;   CYSTECTOMY     Left breast, right lumpectomy B9   INGUINAL HERNIA REPAIR     Right (Dr. Neysa)   LEFT HEART CATH AND CORONARY ANGIOGRAPHY N/A 02/22/2020   Procedure: LEFT HEART CATH AND CORONARY ANGIOGRAPHY;  Surgeon: Wonda Sharper, MD;  Location: [facility] INVASIVE CV LAB;  Service: Cardiovascular;  Laterality: N/A;   LEFT HEART CATH AND CORONARY ANGIOGRAPHY N/A 10/03/2024   Procedure: LEFT HEART CATH AND CORONARY ANGIOGRAPHY;  Surgeon: Wonda Sharper, MD;  Location: [facility] INVASIVE CV LAB;  Service:  Cardiovascular;  Laterality: N/A;   NSVD     X 2   PARATHYROIDECTOMY Right 09/23/2022   Procedure: RIGHT INFERIOR PARATHYROIDECTOMY AND RIGHT SUPERIOR PARATHYROID  BIOPSY;  Surgeon: Eletha Boas, MD;  Location: [facility] ORS;  Service: General;  Laterality: Right;   REVERSE SHOULDER ARTHROPLASTY Right 12/18/2021   Procedure: REVERSE SHOULDER ARTHROPLASTY;  Surgeon: Sharl Selinda Dover, MD;  Location: [facility] OR[facility];  Service: Orthopedics;  Laterality: Right;  150   TVH with vaginal prolapse  08/29/09   Transvag Tape w/Varitensor, Ant & Post Colporraphy, Uterosacral Lig susp, McCall Culdoplasty   ueteroscopy  11/01   without laser secondary stone   Urological procedure  10/22 - 08/31/09   [facility]   Family History  Problem Relation Age of Onset   Hypertension Mother    Heart disease Mother        CHF   Stroke Maternal Aunt    Diabetes Paternal Aunt 10   Stroke Paternal Aunt    Heart disease Paternal Uncle        MI   Heart disease Paternal Uncle        MI   Prostate cancer Son    Breast cancer Cousin        Breast CA   Alcohol abuse Cousin    Thyroid  disease Other    Colon cancer Neg Hx    Social History   Occupational History   Occupation: Retired runner, broadcasting/film/video (primary education) from the [employer]    Employer: RETIRED  Tobacco Use   Smoking status: Never   Smokeless tobacco: Never  Vaping Use   Vaping status: Never Used  Substance and Sexual Activity   Alcohol use: Yes    Alcohol/week: 2.0 - 3.0 standard drinks of alcohol    Types: 2 - 3 Glasses of wine per week    Comment: occas.   Drug use: No   Sexual activity: Not Currently   Tobacco Counseling Counseling given: Not Answered  SDOH Screenings   Food Insecurity: No Food Insecurity (10/17/2024)  Housing: Unknown (10/17/2024)  Transportation Needs: No Transportation Needs (10/17/2024)  Utilities: Not At Risk (10/17/2024)  Alcohol Screen: Low Risk  (08/20/2024)  Depression (PHQ2-9): Low Risk  (10/17/2024)   Financial Resource Strain: Low Risk  (08/20/2024)  Physical Activity: Insufficiently Active (10/17/2024)  Social Connections: Socially Integrated (10/17/2024)  Stress: No Stress Concern Present (10/17/2024)  Tobacco Use: Low Risk  (10/17/2024)  Health Literacy: Adequate Health Literacy (10/17/2024)   See flowsheets for full screening details  Depression Screen PHQ 2 & 9 Depression Scale- Over the past 2 weeks, how often have you been bothered by any of the following problems? Little interest or pleasure in doing things: 0 Feeling down, depressed, or hopeless (PHQ Adolescent also includes...irritable): 0 PHQ-2 Total Score: 0 Trouble falling or staying asleep, or sleeping too much: 0 Feeling tired or having little  energy: 0 Poor appetite or overeating (PHQ Adolescent also includes...weight loss): 0 Feeling bad about yourself - or that you are a failure or have let yourself or your family down: 0 Trouble concentrating on things, such as reading the newspaper or watching television (PHQ Adolescent also includes...like school work): 0 Moving or speaking so slowly that other people could have noticed. Or the opposite - being so fidgety or restless that you have been moving around a lot more than usual: 0 Thoughts that you would be better off dead, or of hurting yourself in some way: 0 PHQ-9 Total Score: 0 If you checked off any problems, how difficult have these problems made it for you to do your work, take care of things at home, or get along with other people?: Not difficult at all     Goals Addressed             This Visit's Progress    Increase physical activity               Objective:    Today's Vitals   10/17/24 0934  Weight: 150 lb (68 kg)  Height: 5' 4 (1.626 m)   Body mass index is 25.75 kg/m.  Hearing/Vision screen Hearing Screening - Comments:: No trouble hearing Vision Screening - Comments:: [facility] Up to date Immunizations and Health  Maintenance Health Maintenance  Topic Date Due   COVID-19 Vaccine (5 - 2025-26 season) 07/09/2024   Colonoscopy  10/18/2024   Influenza Vaccine  02/05/2025 (Originally 06/08/2024)   OPHTHALMOLOGY EXAM  12/11/2024   Diabetic kidney evaluation - Urine ACR  02/12/2025   FOOT EXAM  02/19/2025   HEMOGLOBIN A1C  02/19/2025   Mammogram  03/12/2025   Diabetic kidney evaluation - eGFR measurement  09/17/2025   Medicare Annual Wellness (AWV)  10/17/2025   DTaP/Tdap/Td (6 - Td or Tdap) 05/30/2033   Pneumococcal Vaccine: 50+ Years  Completed   Bone Density Scan  Completed   Hepatitis C Screening  Completed   Zoster Vaccines- Shingrix  Completed   Meningococcal B Vaccine  Aged Out        Assessment/Plan:  This is a routine wellness examination for [place].  Patient Care Team: Cleatus Arlyss RAMAN, MD as PCP - General (Family Medicine) Shlomo Wilbert SAUNDERS, MD as PCP - Cardiology (Cardiology)  I have personally reviewed and noted the following in the patients chart:   Medical and social history Use of alcohol, tobacco or illicit drugs  Current medications and supplements including opioid prescriptions. Functional ability and status Nutritional status Physical activity Advanced directives List of other physicians Hospitalizations, surgeries, and ER visits in previous 12 months Vitals Screenings to include cognitive, depression, and falls Referrals and appointments  No orders of the defined types were placed in this encounter.  In addition, I have reviewed and discussed with patient certain preventive protocols, quality metrics, and best practice recommendations. A written personalized care plan for preventive services as well as general preventive health recommendations were provided to patient.   Mliss Graff, LPN   [id number]   No follow-ups on file.  After Visit Summary: (Mail) Due to this being a telephonic visit, the after visit summary with patients personalized plan was offered to  patient via mail   Nurse Notes:

## 2024-10-17 NOTE — Patient Instructions (Signed)
 Gloria [name],  Thank you for taking the time for your Medicare Wellness Visit. I appreciate your continued commitment to your health goals. Please review the care plan we discussed, and feel free to reach out if I can assist you further.  Please note that Annual Wellness Visits do not include a physical exam. Some assessments may be limited, especially if the visit was conducted virtually. If needed, we may recommend an in-person follow-up with your provider.  Ongoing Care Seeing your primary care provider every 3 to 6 months helps us  monitor your health and provide consistent, personalized care.   Referrals If a referral was made during today's visit and you haven't received any updates within two weeks, please contact the referred provider directly to check on the status.  Recommended Screenings:  Health Maintenance  Topic Date Due   COVID-19 Vaccine (5 - 2025-26 season) 07/09/2024   Colon Cancer Screening  10/18/2024   Flu Shot  02/05/2025*   Eye exam for diabetics  12/11/2024   Yearly kidney health urinalysis for diabetes  02/12/2025   Complete foot exam   02/19/2025   Hemoglobin A1C  02/19/2025   Breast Cancer Screening  03/12/2025   Yearly kidney function blood test for diabetes  09/17/2025   Medicare Annual Wellness Visit  10/17/2025   DTaP/Tdap/Td vaccine (6 - Td or Tdap) 05/30/2033   Pneumococcal Vaccine for age over 46  Completed   Osteoporosis screening with Bone Density Scan  Completed   Hepatitis C Screening  Completed   Zoster (Shingles) Vaccine  Completed   Meningitis B Vaccine  Aged Out  *Topic was postponed. The date shown is not the original due date.       10/17/2024    9:35 AM  Advanced Directives  Does Patient Have a Medical Advance Directive? Yes  Type of Estate Agent of [place];Living will  Copy of Healthcare Power of Attorney in Chart? Yes - validated most recent copy scanned in chart (See row information)    Vision: Annual  vision screenings are recommended for early detection of glaucoma, cataracts, and diabetic retinopathy. These exams can also reveal signs of chronic conditions such as diabetes and high blood pressure.  Dental: Annual dental screenings help detect early signs of oral cancer, gum disease, and other conditions linked to overall health, including heart disease and diabetes.  Please see the attached documents for additional preventive care recommendations.     Gloria [name] , Thank you for taking time to come for your Medicare Wellness Visit. I appreciate your ongoing commitment to your health goals. Please review the following plan we discussed and let me know if I can assist you in the future.   Screening recommendations/referrals: Colonoscopy:  Mammogram:  Bone Density:  Recommended yearly ophthalmology/optometry visit for glaucoma screening and checkup Recommended yearly dental visit for hygiene and checkup  Vaccinations: Influenza vaccine: Pneumococcal vaccine:  Tdap vaccine:  Shingles vaccine:        Preventive Care 65 Years and Older, Female Preventive care refers to lifestyle choices and visits with your health care provider that can promote health and wellness. What does preventive care include? A yearly physical exam. This is also called an annual well check. Dental exams once or twice a year. Routine eye exams. Ask your health care provider how often you should have your eyes checked. Personal lifestyle choices, including: Daily care of your teeth and gums. Regular physical activity. Eating a healthy diet. Avoiding tobacco and drug use. Limiting alcohol use.  Practicing safe sex. Taking low-dose aspirin  every day. Taking vitamin and mineral supplements as recommended by your health care provider. What happens during an annual well check? The services and screenings done by your health care provider during your annual well check will depend on your age, overall health,  lifestyle risk factors, and family history of disease. Counseling  Your health care provider may ask you questions about your: Alcohol use. Tobacco use. Drug use. Emotional well-being. Home and relationship well-being. Sexual activity. Eating habits. History of falls. Memory and ability to understand (cognition). Work and work astronomer. Reproductive health. Screening  You may have the following tests or measurements: Height, weight, and BMI. Blood pressure. Lipid and cholesterol levels. These may be checked every 5 years, or more frequently if you are over 47 years old. Skin check. Lung cancer screening. You may have this screening every year starting at age 58 if you have a 30-pack-year history of smoking and currently smoke or have quit within the past 15 years. Fecal occult blood test (FOBT) of the stool. You may have this test every year starting at age 71. Flexible sigmoidoscopy or colonoscopy. You may have a sigmoidoscopy every 5 years or a colonoscopy every 10 years starting at age 9. Hepatitis C blood test. Hepatitis B blood test. Sexually transmitted disease (STD) testing. Diabetes screening. This is done by checking your blood sugar (glucose) after you have not eaten for a while (fasting). You may have this done every 1-3 years. Bone density scan. This is done to screen for osteoporosis. You may have this done starting at age 73. Mammogram. This may be done every 1-2 years. Talk to your health care provider about how often you should have regular mammograms. Talk with your health care provider about your test results, treatment options, and if necessary, the need for more tests. Vaccines  Your health care provider may recommend certain vaccines, such as: Influenza vaccine. This is recommended every year. Tetanus, diphtheria, and acellular pertussis (Tdap, Td) vaccine. You may need a Td booster every 10 years. Zoster vaccine. You may need this after age  77. Pneumococcal 13-valent conjugate (PCV13) vaccine. One dose is recommended after age 73. Pneumococcal polysaccharide (PPSV23) vaccine. One dose is recommended after age 10. Talk to your health care provider about which screenings and vaccines you need and how often you need them. This information is not intended to replace advice given to you by your health care provider. Make sure you discuss any questions you have with your health care provider. Document Released: 11/21/2015 Document Revised: 07/14/2016 Document Reviewed: 08/26/2015 Elsevier Interactive Patient Education  2017 [employer].  Fall Prevention in the Home Falls can cause injuries. They can happen to people of all ages. There are many things you can do to make your home safe and to help prevent falls. What can I do on the outside of my home? Regularly fix the edges of walkways and driveways and fix any cracks. Remove anything that might make you trip as you walk through a door, such as a raised step or threshold. Trim any bushes or trees on the path to your home. Use bright outdoor lighting. Clear any walking paths of anything that might make someone trip, such as rocks or tools. Regularly check to see if handrails are loose or broken. Make sure that both sides of any steps have handrails. Any raised decks and porches should have guardrails on the edges. Have any leaves, snow, or ice cleared regularly. Use sand or  salt on walking paths during winter. Clean up any spills in your garage right away. This includes oil or grease spills. What can I do in the bathroom? Use night lights. Install grab bars by the toilet and in the tub and shower. Do not use towel bars as grab bars. Use non-skid mats or decals in the tub or shower. If you need to sit down in the shower, use a plastic, non-slip stool. Keep the floor dry. Clean up any water  that spills on the floor as soon as it happens. Remove soap buildup in the tub or shower  regularly. Attach bath mats securely with double-sided non-slip rug tape. Do not have throw rugs and other things on the floor that can make you trip. What can I do in the bedroom? Use night lights. Make sure that you have a light by your bed that is easy to reach. Do not use any sheets or blankets that are too big for your bed. They should not hang down onto the floor. Have a firm chair that has side arms. You can use this for support while you get dressed. Do not have throw rugs and other things on the floor that can make you trip. What can I do in the kitchen? Clean up any spills right away. Avoid walking on wet floors. Keep items that you use a lot in easy-to-reach places. If you need to reach something above you, use a strong step stool that has a grab bar. Keep electrical cords out of the way. Do not use floor polish or wax that makes floors slippery. If you must use wax, use non-skid floor wax. Do not have throw rugs and other things on the floor that can make you trip. What can I do with my stairs? Do not leave any items on the stairs. Make sure that there are handrails on both sides of the stairs and use them. Fix handrails that are broken or loose. Make sure that handrails are as long as the stairways. Check any carpeting to make sure that it is firmly attached to the stairs. Fix any carpet that is loose or worn. Avoid having throw rugs at the top or bottom of the stairs. If you do have throw rugs, attach them to the floor with carpet tape. Make sure that you have a light switch at the top of the stairs and the bottom of the stairs. If you do not have them, ask someone to add them for you. What else can I do to help prevent falls? Wear shoes that: Do not have high heels. Have rubber bottoms. Are comfortable and fit you well. Are closed at the toe. Do not wear sandals. If you use a stepladder: Make sure that it is fully opened. Do not climb a closed stepladder. Make sure that  both sides of the stepladder are locked into place. Ask someone to hold it for you, if possible. Clearly mark and make sure that you can see: Any grab bars or handrails. First and last steps. Where the edge of each step is. Use tools that help you move around (mobility aids) if they are needed. These include: Canes. Walkers. Scooters. Crutches. Turn on the lights when you go into a dark area. Replace any light bulbs as soon as they burn out. Set up your furniture so you have a clear path. Avoid moving your furniture around. If any of your floors are uneven, fix them. If there are any pets around you, be aware of  where they are. Review your medicines with your doctor. Some medicines can make you feel dizzy. This can increase your chance of falling. Ask your doctor what other things that you can do to help prevent falls. This information is not intended to replace advice given to you by your health care provider. Make sure you discuss any questions you have with your health care provider. Document Released: 08/21/2009 Document Revised: 04/01/2016 Document Reviewed: 11/29/2014 Elsevier Interactive Patient Education  2017 [employer].

## 2024-10-19 ENCOUNTER — Ambulatory Visit
Attending: Thoracic Surgery (Cardiothoracic Vascular Surgery) | Admitting: Thoracic Surgery (Cardiothoracic Vascular Surgery)

## 2024-10-19 VITALS — BP 138/86 | HR 73 | Resp 18 | Ht 64.0 in | Wt 150.0 lb

## 2024-10-19 DIAGNOSIS — I350 Nonrheumatic aortic (valve) stenosis: Secondary | ICD-10-CM | POA: Diagnosis not present

## 2024-10-19 NOTE — Progress Notes (Signed)
 [address] [place] [address]             [phone]      MARIAMA SAINTVIL [facility] Medical Record #[id number] Date of Birth: 21-Oct-1946  Referring: Shlomo Wilbert SAUNDERS, MD Primary Care: Cleatus Arlyss RAMAN, MD Primary Cardiologist:Traci Shlomo, MD  Chief Complaint:    Chief Complaint  Patient presents with   Aortic Stenosis    Review TAVR workup    History of Present Illness:     CLORINE SWING is a 78 y.o. female presents for surgical evaluation of severe aortic stenosis.  She has a history of fatigue and exertional dyspnea.      Past Medical History:  Diagnosis Date   Advance directive discussed with patient 01/03/2012   12/2011-AD d/w pt.  She has talked to husband but doesn't have living will yet.  Full code in discussion with patient today.  Would not want prolonged interventions if she were profoundly and/or permanently impaired, ie severe dementia or a condition with no hope of improvement.   01/13/15-Advance directive d/w pt- husband designated if patient were incapacitated.     Allergic rhinitis    Aortic stenosis    Moderate by echo 08/2022   Arthritis    Asthma    as a chld   Complication of anesthesia    Coronary artery disease    Diabetes mellitus without complication (HCC) 05/24/2007   Qualifier: Diagnosis of  By: Bartley MD, Lamar Mulch    Female cystocele 08/10/2012   Health care maintenance 02/09/2018   Heart murmur    Hemorrhoids    History of kidney stones    Hypertension    Left shoulder pain 07/20/2016   Medicare annual wellness visit, subsequent 01/03/2012   MENOPAUSE, SURGICAL 12/16/2009   Qualifier: Diagnosis of  By: Henrietta LPN, Regina     Pure hypercholesterolemia 03/30/2011   RHINITIS 12/24/2010   Qualifier: Diagnosis of  By: Cleatus MD, Arlyss     Sleep apnea    wears mouthpiece   Snoring 01/02/2020    Past Surgical History:  Procedure Laterality Date   ABDOMINAL HYSTERECTOMY     BASAL CELL CARCINOMA  EXCISION  08/23/2017   left upper arm   CARPAL TUNNEL RELEASE     with bilateral releases   CORONARY ATHERECTOMY N/A 02/26/2020   Procedure: CORONARY ATHERECTOMY;  Surgeon: Wonda Sharper, MD;  Location: [facility] INVASIVE CV LAB;  Service: Cardiovascular;  Laterality: N/A;   CYSTECTOMY     Left breast, right lumpectomy B9   INGUINAL HERNIA REPAIR     Right (Dr. Neysa)   LEFT HEART CATH AND CORONARY ANGIOGRAPHY N/A 02/22/2020   Procedure: LEFT HEART CATH AND CORONARY ANGIOGRAPHY;  Surgeon: Wonda Sharper, MD;  Location: [facility] INVASIVE CV LAB;  Service: Cardiovascular;  Laterality: N/A;   LEFT HEART CATH AND CORONARY ANGIOGRAPHY N/A 10/03/2024   Procedure: LEFT HEART CATH AND CORONARY ANGIOGRAPHY;  Surgeon: Wonda Sharper, MD;  Location: [facility] INVASIVE CV LAB;  Service: Cardiovascular;  Laterality: N/A;   NSVD     X 2   PARATHYROIDECTOMY Right 09/23/2022   Procedure: RIGHT INFERIOR PARATHYROIDECTOMY AND RIGHT SUPERIOR PARATHYROID  BIOPSY;  Surgeon: Eletha Boas, MD;  Location: [facility] ORS;  Service: General;  Laterality: Right;   REVERSE SHOULDER ARTHROPLASTY Right 12/18/2021   Procedure: REVERSE SHOULDER ARTHROPLASTY;  Surgeon: Sharl Selinda Dover, MD;  Location: [facility];  Service: Orthopedics;  Laterality: Right;  150  TVH with vaginal prolapse  08/29/09   Transvag Tape w/Varitensor, Ant & Post Colporraphy, Uterosacral Lig susp, McCall Culdoplasty   ueteroscopy  11/01   without laser secondary stone   Urological procedure  10/22 - 08/31/09   [facility]    Social History:  Tobacco Use History[1]  Social History   Substance and Sexual Activity  Alcohol Use Yes   Alcohol/week: 2.0 - 3.0 standard drinks of alcohol   Types: 2 - 3 Glasses of wine per week   Comment: occas.     Allergies[2]    Current Outpatient Medications  Medication Sig Dispense Refill   ACCU-CHEK AVIVA PLUS test strip CHECK BLOOD SUGAR FOUR TIMES DAILY AS NEEDED 100 each 3   ACCU-CHEK FASTCLIX LANCETS MISC Use to check blood  sugar 4 times daily as needed.  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 102 each 11   Ascorbic Acid (VITAMIN C) 1000 MG tablet Take 1,000 mg by mouth in the morning.     aspirin  EC 81 MG tablet Take 1 tablet (81 mg total) by mouth daily. (Patient taking differently: Take 81 mg by mouth at bedtime.) 90 tablet 3   atorvastatin  (LIPITOR) 20 MG tablet TAKE 1 TABLET BY MOUTH ONCE DAILY (Patient taking differently: Take 20 mg by mouth daily after supper.) 30 tablet 3   Blood Glucose Monitoring Suppl (ACCU-CHEK AVIVA PLUS) W/DEVICE KIT Use to check blood sugar 4 times daily as needed.  Diagnosis:  E11.9  Insulin  Dependent 1 kit 0   Cholecalciferol (VITAMIN D ) 50 MCG (2000 UT) CAPS Take 2,000 Units by mouth in the morning.     clopidogrel  (PLAVIX ) 75 MG tablet TAKE 1 TABLET BY MOUTH ONCE DAILY (Patient taking differently: Take 75 mg by mouth in the morning.) 90 tablet 3   conjugated estrogens (PREMARIN) vaginal cream Place 1 applicator vaginally once a week. With weekly pessary usage     insulin  glargine (LANTUS  SOLOSTAR) 100 UNIT/ML Solostar Pen Inject 30-35 Units into the skin daily. (Patient taking differently: Inject 28 Units into the skin daily at 8 pm.)     Insulin  Pen Needle (BD PEN NEEDLE SHORT ULTRAFINE) 31G X 8 MM MISC Use as directed to inject insulin  daily 100 each 4   lisinopril  (ZESTRIL ) 20 MG tablet TAKE ONE TABLET BY MOUTH ONCE DAILY 90 tablet 0   MAGNESIUM  CITRATE PO Take 250 mg by mouth at bedtime.     metFORMIN  (GLUCOPHAGE ) 500 MG tablet TAKE ONE TABLET BY MOUTH TWICE DAILY 180 tablet 3   nitroGLYCERIN  (NITROSTAT ) 0.4 MG SL tablet Place 1 tablet (0.4 mg total) under the tongue every 5 (five) minutes as needed. 25 tablet 3   Omega-3 Fatty Acids (FISH OIL) 1000 MG CAPS Take 1,000 mg by mouth in the morning.     polyethylene glycol powder (GLYCOLAX /MIRALAX ) 17 GM/SCOOP powder Take 8.5 g by mouth daily. (Patient taking differently: Take 8.5-17 g by mouth daily as needed (constipation.).)     No  current facility-administered medications for this visit.    (Not in a hospital admission)   Family History  Problem Relation Age of Onset   Hypertension Mother    Heart disease Mother        CHF   Stroke Maternal Aunt    Diabetes Paternal Aunt 94   Stroke Paternal Aunt    Heart disease Paternal Uncle        MI   Heart disease Paternal Uncle        MI   Prostate cancer  Son    Breast cancer Cousin        Breast CA   Alcohol abuse Cousin    Thyroid  disease Other    Colon cancer Neg Hx      Review of Systems:   Review of Systems  Constitutional:  Positive for malaise/fatigue.  Respiratory:  Positive for shortness of breath.   Cardiovascular:  Negative for chest pain.  Neurological:  Negative for dizziness.      Physical Exam: BP 138/86 (BP Location: Left Arm)   Pulse 73   Resp 18   Ht 5' 4 (1.626 m)   Wt 150 lb (68 kg)   LMP 08/08/2009   SpO2 98%   BMI 25.75 kg/m  Physical Exam Constitutional:      General: She is not in acute distress.    Appearance: She is not ill-appearing.  HENT:     Head: Normocephalic and atraumatic.  Eyes:     Extraocular Movements: Extraocular movements intact.  Cardiovascular:     Rate and Rhythm: Normal rate.  Pulmonary:     Effort: Pulmonary effort is normal. No respiratory distress.  Abdominal:     General: Abdomen is flat. There is no distension.  Musculoskeletal:        General: Normal range of motion.     Cervical back: Normal range of motion.  Skin:    General: Skin is warm and dry.  Neurological:     General: No focal deficit present.     Mental Status: She is alert and oriented to person, place, and time.       Cardiac Studies & Procedures   ______________________________________________________________________________________________ CARDIAC CATHETERIZATION  CARDIAC CATHETERIZATION 10/03/2024  Conclusion 1.  Patent coronary arteries with diffuse calcification and mild nonobstructive disease involving the  left main, LAD, left circumflex, and RCA.  Patent stent in the mid RCA with no significant restenosis. 2.  Known severe aortic stenosis with severely calcified aortic valve on fluoroscopy and restricted aortic valve leaflet mobility.  Recommendations: Continue evaluation for TAVR.  Findings Coronary Findings Diagnostic  Dominance: Right  Left Main There is mild diffuse disease throughout the vessel. The vessel is severely calcified. The left main is heavily calcified with mild distal tapering but no significant stenosis the vessel divides into the LAD and left circumflex, both of which are large vessels, and there is a small intermediate branch as well.  Unchanged from the previous study.  Left Anterior Descending There is mild diffuse disease throughout the vessel. The LAD is diffusely calcified.  The vessel has mild diffuse plaquing with no high-grade stenoses.  The LAD reaches the LV apex. Prox LAD to Mid LAD lesion is 40% stenosed. The lesion is severely calcified.  Left Circumflex There is mild diffuse disease throughout the vessel. The circumflex is large in caliber.  It supplies a large obtuse marginal that divides into twin branches.  The circumflex and OM branches are patent throughout with a 40% stenosis in the mid vessel. This is all stable compared to the previous study. Mid Cx lesion is 40% stenosed. The lesion is moderately calcified.  Third Obtuse Marginal Branch 3rd Mrg lesion is 80% stenosed. The lesion is calcified. Severe branch vessel disease with heavy calcification, not amenable to PCI because of tortuosity and small caliber vessel  Right Coronary Artery There is mild diffuse disease throughout the vessel. Non-stenotic Prox RCA to Mid RCA lesion was previously treated. The lesion is severely calcified. Severe calcific lesion located at the bifurcation of the  mid RCA and acute marginal branch (large vessel)  Acute Marginal Branch Dominant vessel.  Diffusely calcified.   The vessel has mild diffuse 30 to 40% stenosis with a patent stent in the mid vessel.  There is no high-grade obstruction throughout the RCA or its branch vessels.  Intervention  No interventions have been documented.   CARDIAC CATHETERIZATION  CARDIAC CATHETERIZATION 02/26/2020  Conclusion  Prox LAD to Mid LAD lesion is 50% stenosed.  Mid Cx lesion is 40% stenosed.  3rd Mrg lesion is 80% stenosed.  Prox RCA to Mid RCA lesion is 95% stenosed.  A drug-eluting stent was successfully placed using a STENT RESOLUTE ONYX 3.5X12.  Post intervention, there is a 0% residual stenosis.  Successful atherectomy and stenting of severe calcific stenosis of the mid right coronary artery, reducing the lesion from 95% to 0% with TIMI-3 flow both pre and post procedure.  Findings Coronary Findings Diagnostic  Dominance: Right  Left Main The vessel is severely calcified. The left main is heavily calcified with mild distal tapering but no significant stenosis the vessel divides into the LAD and left circumflex, both of which are large vessels, and there is a small intermediate branch as well.  Left Anterior Descending Prox LAD to Mid LAD lesion is 50% stenosed. The lesion is severely calcified.  Left Circumflex Mid Cx lesion is 40% stenosed. The lesion is moderately calcified.  Third Obtuse Marginal Branch 3rd Mrg lesion is 80% stenosed. The lesion is calcified. Severe branch vessel disease with heavy calcification, not amenable to PCI because of tortuosity and small caliber vessel  Right Coronary Artery Prox RCA to Mid RCA lesion is 95% stenosed. The lesion is severely calcified. Severe calcific lesion located at the bifurcation of the mid RCA and acute marginal branch (large vessel)  Intervention  Prox RCA to Mid RCA lesion Stent CATHETER LAUNCHER 6FR JR4 SH guide catheter was inserted. Lesion crossed with guidewire using a WIRE COUGAR XT STRL 190CM. Pre-stent angioplasty was performed  using a BALLOON SAPPHIRE 3.0X12. Maximum pressure:  6 atm. A drug-eluting stent was successfully placed using a STENT RESOLUTE ONYX 3.5X12. Post-stent angioplasty was performed using a BALLOON SAPPHIRE [place] 3.75X8. Maximum pressure:  16 atm. Initially, an AL 0.75 guide catheter was utilized.  This caused marked pressure dampening and we were unable to use this catheter for the procedure.  I changed out to a JR4 sidehole guide.  The patient tolerated this guide without any significant pressure change.  Heparin  was administered for anticoagulation and a therapeutic ACT is achieved.  A Viper flex wire is advanced across the lesion into the distal RCA.  Atherectomy is performed with a CSI 1.25 mm classic crown for multiple runs on low speed and high speed.  The patient tolerated this well.  Following atherectomy, the Viper wire was removed and a cougar wire is advanced into the distal RCA.  The lesion is dilated with a 3.0 mm semicompliant balloon, stented with a 3.5 x 12 mm resolute Onyx DES deployed at 12 atm, and postdilated with a 3.75 x 8 mm noncompliant balloon to 16 atm.  She tolerated the entire procedure well.  There is 0% residual stenosis and TIMI-3 flow at the completion of the procedure. Post-Intervention Lesion Assessment The intervention was successful. Pre-interventional TIMI flow is 3. Post-intervention TIMI flow is 3. No complications occurred at this lesion. There is a 0% residual stenosis post intervention.     ECHOCARDIOGRAM  ECHOCARDIOGRAM COMPLETE 08/13/2024  Narrative ECHOCARDIOGRAM REPORT  Patient Name:   ROXI HLAVATY Date of Exam: 08/13/2024 Medical Rec #:  [id number]      Height:       64.0 in Accession #:    [id number]     Weight:       150.2 lb Date of Birth:  13-Oct-1946      BSA:          1.732 m Patient Age:    32 years       BP:           143/88 mmHg Patient Gender: F              HR:           67 bpm. Exam Location:  [address]  Procedure: 2D Echo, 3D Echo,  Cardiac Doppler, Color Doppler and Strain Analysis (Both Spectral and Color Flow Doppler were utilized during procedure).  Indications:    I35.0 Aortic Stenosis  History:        Patient has prior history of Echocardiogram examinations, most recent 08/23/2023. CAD, Signs/Symptoms:Murmur and Shortness of Breath; Risk Factors:Sleep Apnea, Diabetes and Hypertension.  Sonographer:    Waldo Guadalajara RCS Referring Phys: [id number] TRACI R TURNER  IMPRESSIONS   1. Left ventricular ejection fraction, by estimation, is 60 to 65%. Left ventricular ejection fraction by 3D volume is 62 %. The left ventricle has normal function. The left ventricle has no regional wall motion abnormalities. There is moderate concentric left ventricular hypertrophy. Left ventricular diastolic parameters are consistent with Grade I diastolic dysfunction (impaired relaxation). The average left ventricular global longitudinal strain is -16.3 %. 2. Right ventricular systolic function is normal. The right ventricular size is normal. There is normal pulmonary artery systolic pressure. The estimated right ventricular systolic pressure is 34.1 mmHg. 3. Left atrial size was severely dilated. 4. Interatrial septum shifted right, no definite shunt visualized by color doppler. 5. The mitral valve is normal in structure. Mild mitral valve regurgitation. No evidence of mitral stenosis. 6. The aortic valve is tricuspid. There is severe calcifcation of the aortic valve. Aortic valve regurgitation is trivial. Severe aortic valve stenosis. Aortic valve area, by VTI measures 0.81 cm. Aortic valve mean gradient measures 56.0 mmHg. 7. The inferior vena cava is normal in size with greater than 50% respiratory variability, suggesting right atrial pressure of 3 mmHg.  FINDINGS Left Ventricle: Left ventricular ejection fraction, by estimation, is 60 to 65%. Left ventricular ejection fraction by 3D volume is 62 %. The left ventricle has normal function.  The left ventricle has no regional wall motion abnormalities. The average left ventricular global longitudinal strain is -16.3 %. The left ventricular internal cavity size was normal in size. There is moderate concentric left ventricular hypertrophy. Left ventricular diastolic parameters are consistent with Grade I diastolic dysfunction (impaired relaxation).  Right Ventricle: The right ventricular size is normal. No increase in right ventricular wall thickness. Right ventricular systolic function is normal. There is normal pulmonary artery systolic pressure. The tricuspid regurgitant velocity is 2.79 m/s, and with an assumed right atrial pressure of 3 mmHg, the estimated right ventricular systolic pressure is 34.1 mmHg.  Left Atrium: Left atrial size was severely dilated.  Right Atrium: Right atrial size was normal in size.  Mitral Valve: The mitral valve is normal in structure. There is mild calcification of the mitral valve leaflet(s). Mild mitral annular calcification. Mild mitral valve regurgitation. No evidence of mitral valve stenosis.  Tricuspid Valve: The tricuspid valve is normal in structure.  Tricuspid valve regurgitation is mild.  Aortic Valve: The aortic valve is tricuspid. There is severe calcifcation of the aortic valve. Aortic valve regurgitation is trivial. Severe aortic stenosis is present. Aortic valve mean gradient measures 56.0 mmHg. Aortic valve peak gradient measures 79.6 mmHg. Aortic valve area, by VTI measures 0.81 cm.  Pulmonic Valve: The pulmonic valve was normal in structure. Pulmonic valve regurgitation is trivial.  Aorta: The aortic root is normal in size and structure.  Venous: The inferior vena cava is normal in size with greater than 50% respiratory variability, suggesting right atrial pressure of 3 mmHg.  IAS/Shunts: Interatrial septum shifted right, no definite shunt visualized by color doppler.  Additional Comments: 3D was performed not requiring image  post processing on an independent workstation and was normal.   LEFT VENTRICLE PLAX 2D LVIDd:         3.50 cm         Diastology LVIDs:         2.40 cm         LV e' medial:    5.98 cm/s LV PW:         1.50 cm         LV E/e' medial:  17.6 LV IVS:        1.60 cm         LV e' lateral:   10.70 cm/s LVOT diam:     2.00 cm         LV E/e' lateral: 9.8 LV SV:         93 LV SV Index:   54              2D Longitudinal LVOT Area:     3.14 cm        Strain LV IVRT:       113 msec        2D Strain GLS   -14.4 % (A4C): 2D Strain GLS   -18.9 % (A3C): 2D Strain GLS   -15.5 % (A2C): 2D Strain GLS   -16.3 % Avg:  3D Volume EF LV 3D EF:    Left ventricul ar ejection fraction by 3D volume is 62 %.  3D Volume EF: 3D EF:        62 % LV EDV:       142 ml LV ESV:       54 ml LV SV:        88 ml  RIGHT VENTRICLE RV Basal diam:  3.30 cm    PULMONARY VEINS RV S prime:     9.14 cm/s  A Reversal Velocity: 32.20 cm/s RVSP:           34.1 mmHg  Diastolic Velocity:  36.70 cm/s S/D Velocity:        1.40 Systolic Velocity:   50.80 cm/s  LEFT ATRIUM             Index        RIGHT ATRIUM           Index LA diam:        5.50 cm 3.18 cm/m   RA Pressure: 3.00 mmHg LA Vol (A2C):   80.7 ml 46.59 ml/m  RA Area:     15.80 cm LA Vol (A4C):   92.8 ml 53.57 ml/m  RA Volume:   41.10 ml  23.73 ml/m LA Biplane Vol: 86.9 ml 50.17 ml/m AORTIC VALVE AV Area (Vmax):    0.74 cm AV Area (Vmean):  0.64 cm AV Area (VTI):     0.81 cm AV Vmax:           446.00 cm/s AV Vmean:          357.000 cm/s AV VTI:            1.150 m AV Peak Grad:      79.6 mmHg AV Mean Grad:      56.0 mmHg LVOT Vmax:         105.00 cm/s LVOT Vmean:        72.600 cm/s LVOT VTI:          0.296 m LVOT/AV VTI ratio: 0.26  AORTA Ao Root diam: 3.10 cm Ao Asc diam:  3.40 cm  MITRAL VALVE                TRICUSPID VALVE MV Area (PHT):              TR Peak grad:   31.1 mmHg MV Decel Time:              TR Vmax:        279.00  cm/s MV E velocity: 105.00 cm/s  Estimated RAP:  3.00 mmHg MV A velocity: 139.00 cm/s  RVSP:           34.1 mmHg MV E/A ratio:  0.76 SHUNTS Systemic VTI:  0.30 m Systemic Diam: 2.00 cm  Dalton McleanMD Electronically signed by Ezra Kanner Signature Date/Time: 08/13/2024/4:33:53 PM    Final      CT SCANS  CT CORONARY MORPH W/CTA COR W/SCORE 09/21/2024  Addendum 09/25/2024  6:29 PM ADDENDUM REPORT: 09/25/2024 18:27  EXAM: OVER-READ INTERPRETATION  CT CHEST  The following report is an over-read performed by radiologist Dr. Andrea Gasman of [facility] on 09/25/2024. This over-read does not include interpretation of cardiac or coronary anatomy or pathology. The coronary CTA interpretation by the cardiologist is attached.  COMPARISON:  Same day full field of view chest CTA.  FINDINGS: Aortic atherosclerosis. Please reference same day full field of view chest CTA for full field of view complete thoracic assessment.  IMPRESSION: Aortic Atherosclerosis (ICD10-I70.0).   Electronically Signed By: Andrea Gasman M.D. On: 09/25/2024 18:27  Narrative CLINICAL DATA:  Severe Aortic Stenosis.  EXAM: Cardiac TAVR CT  TECHNIQUE: A non-contrast, gated CT scan was obtained with axial slices of 2.5 mm through the heart for aortic valve scoring. A 100 kV retrospective, gated, contrast cardiac scan was obtained. Gantry rotation speed was 230 msec and collimation was 0.63 mm. Nitroglycerin  was not given. The 3D dataset was reconstructed in systole with motion correction. The 3D data set was reconstructed in 5% intervals of the 0-95% of the R-R cycle. Systolic and diastolic phases were analyzed on a dedicated workstation using MPR, MIP, and VRT modes. The patient received 100 cc of contrast.  FINDINGS: Image quality: Excellent.  Noise artifact is: Limited.  Valve Morphology: Tricuspid aortic valve with severe diffuse calcifications. Severely  restricted leaflet movement in systole.  Aortic Valve Calcium  score: 2871  Aortic annular dimension:  Phase assessed: 30%  Annular area: 444 mm2  Annular perimeter: 76.2 mm  Max diameter: 26.3 mm  Min diameter: 22.3 mm  Annular and subannular calcification: Moderate, single protruding calcification under the LCC extending into LVOT.  Membranous septum length: 6.2 mm  Optimal coplanar projection: LAO 2 CAU 1  Coronary Artery Height above Annulus:  Left Main: 11.9 mm  Right Coronary: 14.9 mm  Sinus of Valsalva  Measurements:  Non-coronary: 34 mm  Right-coronary: 31 mm  Left-coronary: 33 mm  Sinus of Valsalva Height:  Non-coronary: 23.1 mm  Right-coronary: 20.1 mm  Left-coronary: 20.0 mm  Sinotubular Junction: 27 mm  Ascending Thoracic Aorta: 31 mm  Coronary Arteries: Normal coronary origin. Right dominance. The study was performed without use of NTG and is insufficient for plaque evaluation. Severe coronary calcifications, unable to assess stenosis severity.  Cardiac Morphology:  Right Atrium: Right atrial size is dilated.  Right Ventricle: The right ventricular cavity is within normal limits.  Left Atrium: Left atrial size is dilated no left atrial appendage filling defect. The interatrial septum is aneurysmal.  Left Ventricle: The ventricular cavity size is within normal limits.  Pulmonary arteries: Dilated pulmonary artery suggestive of pulmonary hypertension.  Pulmonary veins: Normal pulmonary venous drainage.  Pericardium: Normal thickness with no significant effusion or calcium  present.  Mitral Valve: The mitral valve is normal structure without significant calcification.  Extra-cardiac findings: See attached radiology report for non-cardiac structures.  IMPRESSION: 1. Annular measurements support a 26 mm S3 or 29 mm Evolut Pro.  2. Moderate, single protruding calcification under the LCC extending into LVOT.  3. Sufficient  coronary to annulus distance.  4. Optimal Fluoroscopic Angle for Delivery: LAO 2 CAU 1  5. Dilated pulmonary artery suggestive of pulmonary hypertension.  6. The interatrial septum is aneurysmal.  7. Severe coronary calcifications, unable to assess stenosis severity.  Darryle T. Barbaraann, MD  Electronically Signed: By: Darryle Barbaraann M.D. On: 09/21/2024 12:09   CT SCANS  CT CORONARY MORPH W/CTA COR W/SCORE 02/15/2020  Addendum 02/15/2020  8:55 AM ADDENDUM REPORT: 02/15/2020 08:52  EXAM: OVER-READ INTERPRETATION  CT CHEST  The following report is an over-read performed by radiologist Dr. Waddell Cola [facility] on 02/15/2020. This over-read does not include interpretation of cardiac or coronary anatomy or pathology. The coronary calcium  score/coronary CTA interpretation by the cardiologist is attached.  COMPARISON:  None.  FINDINGS: Cardiovascular: Aortic atherosclerosis. Mild cardiac enlargement. No pericardial effusion.  Mediastinum/nodes: No mass or adenopathy identified.  Lungs/pleura: Scarring identified within the right upper lobe and right middle lobe.  Upper abdomen: No acute abnormality.  Musculoskeletal: Moderate multilevel thoracic degenerative disc disease with vacuum disc and ventral endplate spurring.  IMPRESSION: 1. No mass or adenopathy identified. 2. Thoracic degenerative disc disease.   Electronically Signed By: Waddell Calk M.D. On: 02/15/2020 08:52  Narrative CLINICAL DATA:  Hx of chestpain and dyspnea. Family hx of thoracic aortic aneurysm  EXAM: Cardiac/Coronary  CTA  TECHNIQUE: The patient was scanned on a Siemens Somatoform go.Top scanner.  FINDINGS: A retrospective scan was triggered in the descending thoracic aorta. Axial non-contrast 3 mm slices were carried out through the heart. The data set was analyzed on a dedicated work station and scored using the Agatson method. Gantry rotation speed was 330 msecs  and collimation was .6 mm. 100mg  of metoprolol  po, 5mg  iv and 0.8 mg of sl NTG was given. The 3D data set was reconstructed in 5% intervals of the 60-95 % of the R-R cycle. Diastolic phases were analyzed on a dedicated work station using MPR, MIP and VRT modes. The patient received 75 cc of contrast.  Aorta: Normal size. Acending and descending aortic wall calcifications. No dissection.  Aortic Valve:  Trileaflet. mild calcifications.  Coronary Arteries:  Normal coronary origin.  Right dominance.  RCA is a large dominant artery that gives rise to PDA and PLA. There is heavily calcified plaque thoughout the  vessel causing severe stenosis (>70% )  Left main is a large artery that gives rise to LAD and LCX arteries.  LAD is a large vessel that has heavily calcified plaque in the proximal, mid and distal segments causing severe stenosis (>70% )  LCX is a non-dominant artery that gives rise to 2 obtuse marginal branches. There is heavily calcified plaque in the proximal to mid segments causing severe stenosis (>70% )  Other findings:  Normal pulmonary vein drainage into the left atrium.  Normal left atrial appendage without a thrombus.  Normal size of the pulmonary artery.  IMPRESSION: 1. Coronary calcium  score of 6179. This was 72 percentile for age and sex matched control.  2. Normal ascending aorta size  3. Normal coronary origin with right dominance.  4. 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 CAD-RADS 4 Severe stenosis. (70-99% or > 50% left main). Cardiac catheterization or CT FFR is recommended. Consider symptom-guided anti-ischemic pharmacotherapy as well as risk factor modification per guideline directed care. Additional analysis with CT FFR will be submitted and reported separately.  Electronically Signed: By: Redell Cave M.D. On: 02/14/2020 17:17      ______________________________________________________________________________________________      ECG Normal sinus rhythm    I have independently reviewed the above radiologic studies and discussed with the patient   Recent Lab Findings: Lab Results  Component Value Date   WBC 5.0 09/17/2024   HGB 14.2 09/17/2024   HCT 42.7 09/17/2024   PLT 193 09/17/2024   GLUCOSE 136 (H) 09/17/2024   CHOL 116 02/13/2024   TRIG 75.0 02/13/2024   HDL 34.30 (L) 02/13/2024   LDLCALC 67 02/13/2024   ALT 17 08/21/2024   AST 17 08/21/2024   NA 140 09/17/2024   K 4.2 09/17/2024   CL 105 09/17/2024   CREATININE 0.58 09/17/2024   BUN 14 09/17/2024   CO2 23 09/17/2024   TSH 0.48 02/13/2024   HGBA1C 8.4 (A) 08/21/2024      Assessment / Plan:   78 y.o. female with severe aortic stenosis.  STS score: 3.41.  NYHA Class 2.  The risks and benefits of transfemoral TAVR were discussed in detail.  We also discussed possibility of an emergent sternotomy to address any procedural complications.  Based on our discussion, we collectively decided that an emergent sternotomy would be indicated.  The patient is agreeable to proceed.  Based on my review of her LHC, echo, and CTA, I agree with the multidisciplinary plan to proceed with a 23 mm SAPIEN 3 TAVR.      I  spent 40 minutes counseling the patient face to face.   Linnie MALVA Rayas 10/19/2024 12:35 PM           [1]  Social History Tobacco Use  Smoking Status Never  Smokeless Tobacco Never  [2]  Allergies Allergen Reactions   Sulfonamide Derivatives Other (See Comments)    REACTION: unspecified Childhood   Ciprofloxin Hcl [Ciprofloxacin ] Rash   Penicillins Rash

## 2024-10-19 NOTE — H&P (View-Only) (Signed)
 [address] [place] [address]             [phone]      MARIAMA SAINTVIL [facility] Medical Record #[id number] Date of Birth: 21-Oct-1946  Referring: Shlomo Wilbert SAUNDERS, MD Primary Care: Cleatus Arlyss RAMAN, MD Primary Cardiologist:Traci Shlomo, MD  Chief Complaint:    Chief Complaint  Patient presents with   Aortic Stenosis    Review TAVR workup    History of Present Illness:     CLORINE SWING is a 78 y.o. female presents for surgical evaluation of severe aortic stenosis.  She has a history of fatigue and exertional dyspnea.      Past Medical History:  Diagnosis Date   Advance directive discussed with patient 01/03/2012   12/2011-AD d/w pt.  She has talked to husband but doesn't have living will yet.  Full code in discussion with patient today.  Would not want prolonged interventions if she were profoundly and/or permanently impaired, ie severe dementia or a condition with no hope of improvement.   01/13/15-Advance directive d/w pt- husband designated if patient were incapacitated.     Allergic rhinitis    Aortic stenosis    Moderate by echo 08/2022   Arthritis    Asthma    as a chld   Complication of anesthesia    Coronary artery disease    Diabetes mellitus without complication (HCC) 05/24/2007   Qualifier: Diagnosis of  By: Bartley MD, Lamar Mulch    Female cystocele 08/10/2012   Health care maintenance 02/09/2018   Heart murmur    Hemorrhoids    History of kidney stones    Hypertension    Left shoulder pain 07/20/2016   Medicare annual wellness visit, subsequent 01/03/2012   MENOPAUSE, SURGICAL 12/16/2009   Qualifier: Diagnosis of  By: Henrietta LPN, Regina     Pure hypercholesterolemia 03/30/2011   RHINITIS 12/24/2010   Qualifier: Diagnosis of  By: Cleatus MD, Arlyss     Sleep apnea    wears mouthpiece   Snoring 01/02/2020    Past Surgical History:  Procedure Laterality Date   ABDOMINAL HYSTERECTOMY     BASAL CELL CARCINOMA  EXCISION  08/23/2017   left upper arm   CARPAL TUNNEL RELEASE     with bilateral releases   CORONARY ATHERECTOMY N/A 02/26/2020   Procedure: CORONARY ATHERECTOMY;  Surgeon: Wonda Sharper, MD;  Location: [facility] INVASIVE CV LAB;  Service: Cardiovascular;  Laterality: N/A;   CYSTECTOMY     Left breast, right lumpectomy B9   INGUINAL HERNIA REPAIR     Right (Dr. Neysa)   LEFT HEART CATH AND CORONARY ANGIOGRAPHY N/A 02/22/2020   Procedure: LEFT HEART CATH AND CORONARY ANGIOGRAPHY;  Surgeon: Wonda Sharper, MD;  Location: [facility] INVASIVE CV LAB;  Service: Cardiovascular;  Laterality: N/A;   LEFT HEART CATH AND CORONARY ANGIOGRAPHY N/A 10/03/2024   Procedure: LEFT HEART CATH AND CORONARY ANGIOGRAPHY;  Surgeon: Wonda Sharper, MD;  Location: [facility] INVASIVE CV LAB;  Service: Cardiovascular;  Laterality: N/A;   NSVD     X 2   PARATHYROIDECTOMY Right 09/23/2022   Procedure: RIGHT INFERIOR PARATHYROIDECTOMY AND RIGHT SUPERIOR PARATHYROID  BIOPSY;  Surgeon: Eletha Boas, MD;  Location: [facility] ORS;  Service: General;  Laterality: Right;   REVERSE SHOULDER ARTHROPLASTY Right 12/18/2021   Procedure: REVERSE SHOULDER ARTHROPLASTY;  Surgeon: Sharl Selinda Dover, MD;  Location: [facility];  Service: Orthopedics;  Laterality: Right;  150  TVH with vaginal prolapse  08/29/09   Transvag Tape w/Varitensor, Ant & Post Colporraphy, Uterosacral Lig susp, McCall Culdoplasty   ueteroscopy  11/01   without laser secondary stone   Urological procedure  10/22 - 08/31/09   [facility]    Social History:  Tobacco Use History[1]  Social History   Substance and Sexual Activity  Alcohol Use Yes   Alcohol/week: 2.0 - 3.0 standard drinks of alcohol   Types: 2 - 3 Glasses of wine per week   Comment: occas.     Allergies[2]    Current Outpatient Medications  Medication Sig Dispense Refill   ACCU-CHEK AVIVA PLUS test strip CHECK BLOOD SUGAR FOUR TIMES DAILY AS NEEDED 100 each 3   ACCU-CHEK FASTCLIX LANCETS MISC Use to check blood  sugar 4 times daily as needed.  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 102 each 11   Ascorbic Acid (VITAMIN C) 1000 MG tablet Take 1,000 mg by mouth in the morning.     aspirin  EC 81 MG tablet Take 1 tablet (81 mg total) by mouth daily. (Patient taking differently: Take 81 mg by mouth at bedtime.) 90 tablet 3   atorvastatin  (LIPITOR) 20 MG tablet TAKE 1 TABLET BY MOUTH ONCE DAILY (Patient taking differently: Take 20 mg by mouth daily after supper.) 30 tablet 3   Blood Glucose Monitoring Suppl (ACCU-CHEK AVIVA PLUS) W/DEVICE KIT Use to check blood sugar 4 times daily as needed.  Diagnosis:  E11.9  Insulin  Dependent 1 kit 0   Cholecalciferol (VITAMIN D ) 50 MCG (2000 UT) CAPS Take 2,000 Units by mouth in the morning.     clopidogrel  (PLAVIX ) 75 MG tablet TAKE 1 TABLET BY MOUTH ONCE DAILY (Patient taking differently: Take 75 mg by mouth in the morning.) 90 tablet 3   conjugated estrogens (PREMARIN) vaginal cream Place 1 applicator vaginally once a week. With weekly pessary usage     insulin  glargine (LANTUS  SOLOSTAR) 100 UNIT/ML Solostar Pen Inject 30-35 Units into the skin daily. (Patient taking differently: Inject 28 Units into the skin daily at 8 pm.)     Insulin  Pen Needle (BD PEN NEEDLE SHORT ULTRAFINE) 31G X 8 MM MISC Use as directed to inject insulin  daily 100 each 4   lisinopril  (ZESTRIL ) 20 MG tablet TAKE ONE TABLET BY MOUTH ONCE DAILY 90 tablet 0   MAGNESIUM  CITRATE PO Take 250 mg by mouth at bedtime.     metFORMIN  (GLUCOPHAGE ) 500 MG tablet TAKE ONE TABLET BY MOUTH TWICE DAILY 180 tablet 3   nitroGLYCERIN  (NITROSTAT ) 0.4 MG SL tablet Place 1 tablet (0.4 mg total) under the tongue every 5 (five) minutes as needed. 25 tablet 3   Omega-3 Fatty Acids (FISH OIL) 1000 MG CAPS Take 1,000 mg by mouth in the morning.     polyethylene glycol powder (GLYCOLAX /MIRALAX ) 17 GM/SCOOP powder Take 8.5 g by mouth daily. (Patient taking differently: Take 8.5-17 g by mouth daily as needed (constipation.).)     No  current facility-administered medications for this visit.    (Not in a hospital admission)   Family History  Problem Relation Age of Onset   Hypertension Mother    Heart disease Mother        CHF   Stroke Maternal Aunt    Diabetes Paternal Aunt 94   Stroke Paternal Aunt    Heart disease Paternal Uncle        MI   Heart disease Paternal Uncle        MI   Prostate cancer  Son    Breast cancer Cousin        Breast CA   Alcohol abuse Cousin    Thyroid  disease Other    Colon cancer Neg Hx      Review of Systems:   Review of Systems  Constitutional:  Positive for malaise/fatigue.  Respiratory:  Positive for shortness of breath.   Cardiovascular:  Negative for chest pain.  Neurological:  Negative for dizziness.      Physical Exam: BP 138/86 (BP Location: Left Arm)   Pulse 73   Resp 18   Ht 5' 4 (1.626 m)   Wt 150 lb (68 kg)   LMP 08/08/2009   SpO2 98%   BMI 25.75 kg/m  Physical Exam Constitutional:      General: She is not in acute distress.    Appearance: She is not ill-appearing.  HENT:     Head: Normocephalic and atraumatic.  Eyes:     Extraocular Movements: Extraocular movements intact.  Cardiovascular:     Rate and Rhythm: Normal rate.  Pulmonary:     Effort: Pulmonary effort is normal. No respiratory distress.  Abdominal:     General: Abdomen is flat. There is no distension.  Musculoskeletal:        General: Normal range of motion.     Cervical back: Normal range of motion.  Skin:    General: Skin is warm and dry.  Neurological:     General: No focal deficit present.     Mental Status: She is alert and oriented to person, place, and time.       Cardiac Studies & Procedures   ______________________________________________________________________________________________ CARDIAC CATHETERIZATION  CARDIAC CATHETERIZATION 10/03/2024  Conclusion 1.  Patent coronary arteries with diffuse calcification and mild nonobstructive disease involving the  left main, LAD, left circumflex, and RCA.  Patent stent in the mid RCA with no significant restenosis. 2.  Known severe aortic stenosis with severely calcified aortic valve on fluoroscopy and restricted aortic valve leaflet mobility.  Recommendations: Continue evaluation for TAVR.  Findings Coronary Findings Diagnostic  Dominance: Right  Left Main There is mild diffuse disease throughout the vessel. The vessel is severely calcified. The left main is heavily calcified with mild distal tapering but no significant stenosis the vessel divides into the LAD and left circumflex, both of which are large vessels, and there is a small intermediate branch as well.  Unchanged from the previous study.  Left Anterior Descending There is mild diffuse disease throughout the vessel. The LAD is diffusely calcified.  The vessel has mild diffuse plaquing with no high-grade stenoses.  The LAD reaches the LV apex. Prox LAD to Mid LAD lesion is 40% stenosed. The lesion is severely calcified.  Left Circumflex There is mild diffuse disease throughout the vessel. The circumflex is large in caliber.  It supplies a large obtuse marginal that divides into twin branches.  The circumflex and OM branches are patent throughout with a 40% stenosis in the mid vessel. This is all stable compared to the previous study. Mid Cx lesion is 40% stenosed. The lesion is moderately calcified.  Third Obtuse Marginal Branch 3rd Mrg lesion is 80% stenosed. The lesion is calcified. Severe branch vessel disease with heavy calcification, not amenable to PCI because of tortuosity and small caliber vessel  Right Coronary Artery There is mild diffuse disease throughout the vessel. Non-stenotic Prox RCA to Mid RCA lesion was previously treated. The lesion is severely calcified. Severe calcific lesion located at the bifurcation of the  mid RCA and acute marginal branch (large vessel)  Acute Marginal Branch Dominant vessel.  Diffusely calcified.   The vessel has mild diffuse 30 to 40% stenosis with a patent stent in the mid vessel.  There is no high-grade obstruction throughout the RCA or its branch vessels.  Intervention  No interventions have been documented.   CARDIAC CATHETERIZATION  CARDIAC CATHETERIZATION 02/26/2020  Conclusion  Prox LAD to Mid LAD lesion is 50% stenosed.  Mid Cx lesion is 40% stenosed.  3rd Mrg lesion is 80% stenosed.  Prox RCA to Mid RCA lesion is 95% stenosed.  A drug-eluting stent was successfully placed using a STENT RESOLUTE ONYX 3.5X12.  Post intervention, there is a 0% residual stenosis.  Successful atherectomy and stenting of severe calcific stenosis of the mid right coronary artery, reducing the lesion from 95% to 0% with TIMI-3 flow both pre and post procedure.  Findings Coronary Findings Diagnostic  Dominance: Right  Left Main The vessel is severely calcified. The left main is heavily calcified with mild distal tapering but no significant stenosis the vessel divides into the LAD and left circumflex, both of which are large vessels, and there is a small intermediate branch as well.  Left Anterior Descending Prox LAD to Mid LAD lesion is 50% stenosed. The lesion is severely calcified.  Left Circumflex Mid Cx lesion is 40% stenosed. The lesion is moderately calcified.  Third Obtuse Marginal Branch 3rd Mrg lesion is 80% stenosed. The lesion is calcified. Severe branch vessel disease with heavy calcification, not amenable to PCI because of tortuosity and small caliber vessel  Right Coronary Artery Prox RCA to Mid RCA lesion is 95% stenosed. The lesion is severely calcified. Severe calcific lesion located at the bifurcation of the mid RCA and acute marginal branch (large vessel)  Intervention  Prox RCA to Mid RCA lesion Stent CATHETER LAUNCHER 6FR JR4 SH guide catheter was inserted. Lesion crossed with guidewire using a WIRE COUGAR XT STRL 190CM. Pre-stent angioplasty was performed  using a BALLOON SAPPHIRE 3.0X12. Maximum pressure:  6 atm. A drug-eluting stent was successfully placed using a STENT RESOLUTE ONYX 3.5X12. Post-stent angioplasty was performed using a BALLOON SAPPHIRE [place] 3.75X8. Maximum pressure:  16 atm. Initially, an AL 0.75 guide catheter was utilized.  This caused marked pressure dampening and we were unable to use this catheter for the procedure.  I changed out to a JR4 sidehole guide.  The patient tolerated this guide without any significant pressure change.  Heparin  was administered for anticoagulation and a therapeutic ACT is achieved.  A Viper flex wire is advanced across the lesion into the distal RCA.  Atherectomy is performed with a CSI 1.25 mm classic crown for multiple runs on low speed and high speed.  The patient tolerated this well.  Following atherectomy, the Viper wire was removed and a cougar wire is advanced into the distal RCA.  The lesion is dilated with a 3.0 mm semicompliant balloon, stented with a 3.5 x 12 mm resolute Onyx DES deployed at 12 atm, and postdilated with a 3.75 x 8 mm noncompliant balloon to 16 atm.  She tolerated the entire procedure well.  There is 0% residual stenosis and TIMI-3 flow at the completion of the procedure. Post-Intervention Lesion Assessment The intervention was successful. Pre-interventional TIMI flow is 3. Post-intervention TIMI flow is 3. No complications occurred at this lesion. There is a 0% residual stenosis post intervention.     ECHOCARDIOGRAM  ECHOCARDIOGRAM COMPLETE 08/13/2024  Narrative ECHOCARDIOGRAM REPORT  Patient Name:   ROXI HLAVATY Date of Exam: 08/13/2024 Medical Rec #:  [id number]      Height:       64.0 in Accession #:    [id number]     Weight:       150.2 lb Date of Birth:  13-Oct-1946      BSA:          1.732 m Patient Age:    32 years       BP:           143/88 mmHg Patient Gender: F              HR:           67 bpm. Exam Location:  [address]  Procedure: 2D Echo, 3D Echo,  Cardiac Doppler, Color Doppler and Strain Analysis (Both Spectral and Color Flow Doppler were utilized during procedure).  Indications:    I35.0 Aortic Stenosis  History:        Patient has prior history of Echocardiogram examinations, most recent 08/23/2023. CAD, Signs/Symptoms:Murmur and Shortness of Breath; Risk Factors:Sleep Apnea, Diabetes and Hypertension.  Sonographer:    Waldo Guadalajara RCS Referring Phys: [id number] TRACI R TURNER  IMPRESSIONS   1. Left ventricular ejection fraction, by estimation, is 60 to 65%. Left ventricular ejection fraction by 3D volume is 62 %. The left ventricle has normal function. The left ventricle has no regional wall motion abnormalities. There is moderate concentric left ventricular hypertrophy. Left ventricular diastolic parameters are consistent with Grade I diastolic dysfunction (impaired relaxation). The average left ventricular global longitudinal strain is -16.3 %. 2. Right ventricular systolic function is normal. The right ventricular size is normal. There is normal pulmonary artery systolic pressure. The estimated right ventricular systolic pressure is 34.1 mmHg. 3. Left atrial size was severely dilated. 4. Interatrial septum shifted right, no definite shunt visualized by color doppler. 5. The mitral valve is normal in structure. Mild mitral valve regurgitation. No evidence of mitral stenosis. 6. The aortic valve is tricuspid. There is severe calcifcation of the aortic valve. Aortic valve regurgitation is trivial. Severe aortic valve stenosis. Aortic valve area, by VTI measures 0.81 cm. Aortic valve mean gradient measures 56.0 mmHg. 7. The inferior vena cava is normal in size with greater than 50% respiratory variability, suggesting right atrial pressure of 3 mmHg.  FINDINGS Left Ventricle: Left ventricular ejection fraction, by estimation, is 60 to 65%. Left ventricular ejection fraction by 3D volume is 62 %. The left ventricle has normal function.  The left ventricle has no regional wall motion abnormalities. The average left ventricular global longitudinal strain is -16.3 %. The left ventricular internal cavity size was normal in size. There is moderate concentric left ventricular hypertrophy. Left ventricular diastolic parameters are consistent with Grade I diastolic dysfunction (impaired relaxation).  Right Ventricle: The right ventricular size is normal. No increase in right ventricular wall thickness. Right ventricular systolic function is normal. There is normal pulmonary artery systolic pressure. The tricuspid regurgitant velocity is 2.79 m/s, and with an assumed right atrial pressure of 3 mmHg, the estimated right ventricular systolic pressure is 34.1 mmHg.  Left Atrium: Left atrial size was severely dilated.  Right Atrium: Right atrial size was normal in size.  Mitral Valve: The mitral valve is normal in structure. There is mild calcification of the mitral valve leaflet(s). Mild mitral annular calcification. Mild mitral valve regurgitation. No evidence of mitral valve stenosis.  Tricuspid Valve: The tricuspid valve is normal in structure.  Tricuspid valve regurgitation is mild.  Aortic Valve: The aortic valve is tricuspid. There is severe calcifcation of the aortic valve. Aortic valve regurgitation is trivial. Severe aortic stenosis is present. Aortic valve mean gradient measures 56.0 mmHg. Aortic valve peak gradient measures 79.6 mmHg. Aortic valve area, by VTI measures 0.81 cm.  Pulmonic Valve: The pulmonic valve was normal in structure. Pulmonic valve regurgitation is trivial.  Aorta: The aortic root is normal in size and structure.  Venous: The inferior vena cava is normal in size with greater than 50% respiratory variability, suggesting right atrial pressure of 3 mmHg.  IAS/Shunts: Interatrial septum shifted right, no definite shunt visualized by color doppler.  Additional Comments: 3D was performed not requiring image  post processing on an independent workstation and was normal.   LEFT VENTRICLE PLAX 2D LVIDd:         3.50 cm         Diastology LVIDs:         2.40 cm         LV e' medial:    5.98 cm/s LV PW:         1.50 cm         LV E/e' medial:  17.6 LV IVS:        1.60 cm         LV e' lateral:   10.70 cm/s LVOT diam:     2.00 cm         LV E/e' lateral: 9.8 LV SV:         93 LV SV Index:   54              2D Longitudinal LVOT Area:     3.14 cm        Strain LV IVRT:       113 msec        2D Strain GLS   -14.4 % (A4C): 2D Strain GLS   -18.9 % (A3C): 2D Strain GLS   -15.5 % (A2C): 2D Strain GLS   -16.3 % Avg:  3D Volume EF LV 3D EF:    Left ventricul ar ejection fraction by 3D volume is 62 %.  3D Volume EF: 3D EF:        62 % LV EDV:       142 ml LV ESV:       54 ml LV SV:        88 ml  RIGHT VENTRICLE RV Basal diam:  3.30 cm    PULMONARY VEINS RV S prime:     9.14 cm/s  A Reversal Velocity: 32.20 cm/s RVSP:           34.1 mmHg  Diastolic Velocity:  36.70 cm/s S/D Velocity:        1.40 Systolic Velocity:   50.80 cm/s  LEFT ATRIUM             Index        RIGHT ATRIUM           Index LA diam:        5.50 cm 3.18 cm/m   RA Pressure: 3.00 mmHg LA Vol (A2C):   80.7 ml 46.59 ml/m  RA Area:     15.80 cm LA Vol (A4C):   92.8 ml 53.57 ml/m  RA Volume:   41.10 ml  23.73 ml/m LA Biplane Vol: 86.9 ml 50.17 ml/m AORTIC VALVE AV Area (Vmax):    0.74 cm AV Area (Vmean):  0.64 cm AV Area (VTI):     0.81 cm AV Vmax:           446.00 cm/s AV Vmean:          357.000 cm/s AV VTI:            1.150 m AV Peak Grad:      79.6 mmHg AV Mean Grad:      56.0 mmHg LVOT Vmax:         105.00 cm/s LVOT Vmean:        72.600 cm/s LVOT VTI:          0.296 m LVOT/AV VTI ratio: 0.26  AORTA Ao Root diam: 3.10 cm Ao Asc diam:  3.40 cm  MITRAL VALVE                TRICUSPID VALVE MV Area (PHT):              TR Peak grad:   31.1 mmHg MV Decel Time:              TR Vmax:        279.00  cm/s MV E velocity: 105.00 cm/s  Estimated RAP:  3.00 mmHg MV A velocity: 139.00 cm/s  RVSP:           34.1 mmHg MV E/A ratio:  0.76 SHUNTS Systemic VTI:  0.30 m Systemic Diam: 2.00 cm  Dalton McleanMD Electronically signed by Ezra Kanner Signature Date/Time: 08/13/2024/4:33:53 PM    Final      CT SCANS  CT CORONARY MORPH W/CTA COR W/SCORE 09/21/2024  Addendum 09/25/2024  6:29 PM ADDENDUM REPORT: 09/25/2024 18:27  EXAM: OVER-READ INTERPRETATION  CT CHEST  The following report is an over-read performed by radiologist Dr. Andrea Gasman of [facility] on 09/25/2024. This over-read does not include interpretation of cardiac or coronary anatomy or pathology. The coronary CTA interpretation by the cardiologist is attached.  COMPARISON:  Same day full field of view chest CTA.  FINDINGS: Aortic atherosclerosis. Please reference same day full field of view chest CTA for full field of view complete thoracic assessment.  IMPRESSION: Aortic Atherosclerosis (ICD10-I70.0).   Electronically Signed By: Andrea Gasman M.D. On: 09/25/2024 18:27  Narrative CLINICAL DATA:  Severe Aortic Stenosis.  EXAM: Cardiac TAVR CT  TECHNIQUE: A non-contrast, gated CT scan was obtained with axial slices of 2.5 mm through the heart for aortic valve scoring. A 100 kV retrospective, gated, contrast cardiac scan was obtained. Gantry rotation speed was 230 msec and collimation was 0.63 mm. Nitroglycerin  was not given. The 3D dataset was reconstructed in systole with motion correction. The 3D data set was reconstructed in 5% intervals of the 0-95% of the R-R cycle. Systolic and diastolic phases were analyzed on a dedicated workstation using MPR, MIP, and VRT modes. The patient received 100 cc of contrast.  FINDINGS: Image quality: Excellent.  Noise artifact is: Limited.  Valve Morphology: Tricuspid aortic valve with severe diffuse calcifications. Severely  restricted leaflet movement in systole.  Aortic Valve Calcium  score: 2871  Aortic annular dimension:  Phase assessed: 30%  Annular area: 444 mm2  Annular perimeter: 76.2 mm  Max diameter: 26.3 mm  Min diameter: 22.3 mm  Annular and subannular calcification: Moderate, single protruding calcification under the LCC extending into LVOT.  Membranous septum length: 6.2 mm  Optimal coplanar projection: LAO 2 CAU 1  Coronary Artery Height above Annulus:  Left Main: 11.9 mm  Right Coronary: 14.9 mm  Sinus of Valsalva  Measurements:  Non-coronary: 34 mm  Right-coronary: 31 mm  Left-coronary: 33 mm  Sinus of Valsalva Height:  Non-coronary: 23.1 mm  Right-coronary: 20.1 mm  Left-coronary: 20.0 mm  Sinotubular Junction: 27 mm  Ascending Thoracic Aorta: 31 mm  Coronary Arteries: Normal coronary origin. Right dominance. The study was performed without use of NTG and is insufficient for plaque evaluation. Severe coronary calcifications, unable to assess stenosis severity.  Cardiac Morphology:  Right Atrium: Right atrial size is dilated.  Right Ventricle: The right ventricular cavity is within normal limits.  Left Atrium: Left atrial size is dilated no left atrial appendage filling defect. The interatrial septum is aneurysmal.  Left Ventricle: The ventricular cavity size is within normal limits.  Pulmonary arteries: Dilated pulmonary artery suggestive of pulmonary hypertension.  Pulmonary veins: Normal pulmonary venous drainage.  Pericardium: Normal thickness with no significant effusion or calcium  present.  Mitral Valve: The mitral valve is normal structure without significant calcification.  Extra-cardiac findings: See attached radiology report for non-cardiac structures.  IMPRESSION: 1. Annular measurements support a 26 mm S3 or 29 mm Evolut Pro.  2. Moderate, single protruding calcification under the LCC extending into LVOT.  3. Sufficient  coronary to annulus distance.  4. Optimal Fluoroscopic Angle for Delivery: LAO 2 CAU 1  5. Dilated pulmonary artery suggestive of pulmonary hypertension.  6. The interatrial septum is aneurysmal.  7. Severe coronary calcifications, unable to assess stenosis severity.  Darryle T. Barbaraann, MD  Electronically Signed: By: Darryle Barbaraann M.D. On: 09/21/2024 12:09   CT SCANS  CT CORONARY MORPH W/CTA COR W/SCORE 02/15/2020  Addendum 02/15/2020  8:55 AM ADDENDUM REPORT: 02/15/2020 08:52  EXAM: OVER-READ INTERPRETATION  CT CHEST  The following report is an over-read performed by radiologist Dr. Waddell Cola [facility] on 02/15/2020. This over-read does not include interpretation of cardiac or coronary anatomy or pathology. The coronary calcium  score/coronary CTA interpretation by the cardiologist is attached.  COMPARISON:  None.  FINDINGS: Cardiovascular: Aortic atherosclerosis. Mild cardiac enlargement. No pericardial effusion.  Mediastinum/nodes: No mass or adenopathy identified.  Lungs/pleura: Scarring identified within the right upper lobe and right middle lobe.  Upper abdomen: No acute abnormality.  Musculoskeletal: Moderate multilevel thoracic degenerative disc disease with vacuum disc and ventral endplate spurring.  IMPRESSION: 1. No mass or adenopathy identified. 2. Thoracic degenerative disc disease.   Electronically Signed By: Waddell Calk M.D. On: 02/15/2020 08:52  Narrative CLINICAL DATA:  Hx of chestpain and dyspnea. Family hx of thoracic aortic aneurysm  EXAM: Cardiac/Coronary  CTA  TECHNIQUE: The patient was scanned on a Siemens Somatoform go.Top scanner.  FINDINGS: A retrospective scan was triggered in the descending thoracic aorta. Axial non-contrast 3 mm slices were carried out through the heart. The data set was analyzed on a dedicated work station and scored using the Agatson method. Gantry rotation speed was 330 msecs  and collimation was .6 mm. 100mg  of metoprolol  po, 5mg  iv and 0.8 mg of sl NTG was given. The 3D data set was reconstructed in 5% intervals of the 60-95 % of the R-R cycle. Diastolic phases were analyzed on a dedicated work station using MPR, MIP and VRT modes. The patient received 75 cc of contrast.  Aorta: Normal size. Acending and descending aortic wall calcifications. No dissection.  Aortic Valve:  Trileaflet. mild calcifications.  Coronary Arteries:  Normal coronary origin.  Right dominance.  RCA is a large dominant artery that gives rise to PDA and PLA. There is heavily calcified plaque thoughout the  vessel causing severe stenosis (>70% )  Left main is a large artery that gives rise to LAD and LCX arteries.  LAD is a large vessel that has heavily calcified plaque in the proximal, mid and distal segments causing severe stenosis (>70% )  LCX is a non-dominant artery that gives rise to 2 obtuse marginal branches. There is heavily calcified plaque in the proximal to mid segments causing severe stenosis (>70% )  Other findings:  Normal pulmonary vein drainage into the left atrium.  Normal left atrial appendage without a thrombus.  Normal size of the pulmonary artery.  IMPRESSION: 1. Coronary calcium  score of 6179. This was 72 percentile for age and sex matched control.  2. Normal ascending aorta size  3. Normal coronary origin with right dominance.  4. 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 CAD-RADS 4 Severe stenosis. (70-99% or > 50% left main). Cardiac catheterization or CT FFR is recommended. Consider symptom-guided anti-ischemic pharmacotherapy as well as risk factor modification per guideline directed care. Additional analysis with CT FFR will be submitted and reported separately.  Electronically Signed: By: Redell Cave M.D. On: 02/14/2020 17:17      ______________________________________________________________________________________________      ECG Normal sinus rhythm    I have independently reviewed the above radiologic studies and discussed with the patient   Recent Lab Findings: Lab Results  Component Value Date   WBC 5.0 09/17/2024   HGB 14.2 09/17/2024   HCT 42.7 09/17/2024   PLT 193 09/17/2024   GLUCOSE 136 (H) 09/17/2024   CHOL 116 02/13/2024   TRIG 75.0 02/13/2024   HDL 34.30 (L) 02/13/2024   LDLCALC 67 02/13/2024   ALT 17 08/21/2024   AST 17 08/21/2024   NA 140 09/17/2024   K 4.2 09/17/2024   CL 105 09/17/2024   CREATININE 0.58 09/17/2024   BUN 14 09/17/2024   CO2 23 09/17/2024   TSH 0.48 02/13/2024   HGBA1C 8.4 (A) 08/21/2024      Assessment / Plan:   78 y.o. female with severe aortic stenosis.  STS score: 3.41.  NYHA Class 2.  The risks and benefits of transfemoral TAVR were discussed in detail.  We also discussed possibility of an emergent sternotomy to address any procedural complications.  Based on our discussion, we collectively decided that an emergent sternotomy would be indicated.  The patient is agreeable to proceed.  Based on my review of her LHC, echo, and CTA, I agree with the multidisciplinary plan to proceed with a 23 mm SAPIEN 3 TAVR.      I  spent 40 minutes counseling the patient face to face.   Linnie MALVA Rayas 10/19/2024 12:35 PM           [1]  Social History Tobacco Use  Smoking Status Never  Smokeless Tobacco Never  [2]  Allergies Allergen Reactions   Sulfonamide Derivatives Other (See Comments)    REACTION: unspecified Childhood   Ciprofloxin Hcl [Ciprofloxacin ] Rash   Penicillins Rash

## 2024-10-22 ENCOUNTER — Ambulatory Visit (HOSPITAL_COMMUNITY)
Admission: RE | Admit: 2024-10-22 | Discharge: 2024-10-22 | Disposition: A | Source: Ambulatory Visit | Attending: Cardiovascular Disease | Admitting: Cardiovascular Disease

## 2024-10-22 ENCOUNTER — Encounter: Payer: Self-pay | Admitting: Cardiovascular Disease

## 2024-10-22 ENCOUNTER — Inpatient Hospital Stay (HOSPITAL_COMMUNITY)
Admission: RE | Admit: 2024-10-22 | Discharge: 2024-10-22 | Disposition: A | Source: Ambulatory Visit | Attending: Cardiovascular Disease

## 2024-10-22 DIAGNOSIS — I350 Nonrheumatic aortic (valve) stenosis: Secondary | ICD-10-CM

## 2024-10-22 DIAGNOSIS — Z01818 Encounter for other preprocedural examination: Secondary | ICD-10-CM

## 2024-10-22 DIAGNOSIS — Z01812 Encounter for preprocedural laboratory examination: Secondary | ICD-10-CM | POA: Insufficient documentation

## 2024-10-22 LAB — PROTIME-INR
INR: 1 (ref 0.8–1.2)
Prothrombin Time: 13.7 s (ref 11.4–15.2)

## 2024-10-22 LAB — COMPREHENSIVE METABOLIC PANEL WITH GFR
ALT: 22 U/L (ref 0–44)
AST: 21 U/L (ref 15–41)
Albumin: 3.9 g/dL (ref 3.5–5.0)
Alkaline Phosphatase: 94 U/L (ref 38–126)
Anion gap: 6 (ref 5–15)
BUN: 14 mg/dL (ref 8–23)
CO2: 29 mmol/L (ref 22–32)
Calcium: 9.4 mg/dL (ref 8.9–10.3)
Chloride: 103 mmol/L (ref 98–111)
Creatinine, Ser: 0.5 mg/dL (ref 0.44–1.00)
GFR, Estimated: >60 mL/min (ref >60)
Glucose, Bld: 157 mg/dL — ABNORMAL HIGH (ref 70–99)
Potassium: 4.1 mmol/L (ref 3.5–5.1)
Sodium: 138 mmol/L (ref 135–145)
Total Bilirubin: 2.2 mg/dL — ABNORMAL HIGH (ref 0.0–1.2)
Total Protein: 6.8 g/dL (ref 6.5–8.1)

## 2024-10-22 LAB — CBC
HCT: 45.4 % (ref 36.0–46.0)
Hemoglobin: 15.1 g/dL — ABNORMAL HIGH (ref 12.0–15.0)
MCH: 31.2 pg (ref 26.0–34.0)
MCHC: 33.3 g/dL (ref 30.0–36.0)
MCV: 93.8 fL (ref 80.0–100.0)
Platelets: 185 K/uL (ref 150–400)
RBC: 4.84 MIL/uL (ref 3.87–5.11)
RDW: 13.2 % (ref 11.5–15.5)
WBC: 4.2 K/uL (ref 4.0–10.5)
nRBC: 0 % (ref 0.0–0.2)

## 2024-10-22 LAB — URINALYSIS, ROUTINE W REFLEX MICROSCOPIC
Bilirubin Urine: NEGATIVE
Glucose, UA: NEGATIVE mg/dL
Hgb urine dipstick: NEGATIVE
Ketones, ur: NEGATIVE mg/dL
Nitrite: NEGATIVE
Protein, ur: NEGATIVE mg/dL
Specific Gravity, Urine: 1.009 (ref 1.005–1.030)
pH: 6 (ref 5.0–8.0)

## 2024-10-22 LAB — SURGICAL PCR SCREEN

## 2024-10-22 LAB — TYPE AND SCREEN
ABO/RH(D): A POS
Antibody Screen: NEGATIVE

## 2024-10-22 NOTE — Progress Notes (Signed)
 All consents signed by patient at PAT lab appointment. Pt was sent home with printed copy of surgical instructions and CHG soap/CHG soap instructions. All instructions reviewed with patient and questions answered.  Patients chart send to anesthesia for review. Pt denies any respiratory illness/infection in the last two months.

## 2024-10-23 ENCOUNTER — Ambulatory Visit: Admitting: Gastroenterology

## 2024-10-23 MED ORDER — POTASSIUM CHLORIDE 2 MEQ/ML IV SOLN
80.0000 meq | INTRAVENOUS | Status: DC
  Filled 2024-10-23: qty 40

## 2024-10-23 MED ORDER — CEFAZOLIN SODIUM-DEXTROSE 2-4 GM/100ML-% IV SOLN
2.0000 g | INTRAVENOUS | Status: AC
  Administered 2024-10-24: 09:00:00 2 g via INTRAVENOUS
  Filled 2024-10-23: qty 100

## 2024-10-23 MED ORDER — NOREPINEPHRINE 4 MG/250ML-% IV SOLN
0.0000 ug/min | INTRAVENOUS | Status: AC
  Administered 2024-10-24: 09:00:00 2 ug/min via INTRAVENOUS
  Filled 2024-10-23: qty 250

## 2024-10-23 MED ORDER — MAGNESIUM SULFATE 50 % IJ SOLN
40.0000 meq | INTRAMUSCULAR | Status: DC
  Filled 2024-10-23: qty 9.85

## 2024-10-23 MED ORDER — DEXMEDETOMIDINE HCL IN NACL 400 MCG/100ML IV SOLN
0.1000 ug/kg/h | INTRAVENOUS | Status: AC
  Administered 2024-10-24: 09:00:00 68 ug via INTRAVENOUS
  Administered 2024-10-24: 09:00:00 1 ug/kg/h via INTRAVENOUS
  Filled 2024-10-23: qty 100

## 2024-10-23 MED ORDER — HEPARIN 30,000 UNITS/1000 ML (OHS) CELLSAVER SOLUTION
Status: DC
  Filled 2024-10-23: qty 1000

## 2024-10-23 NOTE — Progress Notes (Signed)
 Surgical PCR result invalid. Will need to be recollected DOS.

## 2024-10-24 ENCOUNTER — Encounter (HOSPITAL_COMMUNITY): Payer: Self-pay | Admitting: Cardiovascular Disease

## 2024-10-24 ENCOUNTER — Inpatient Hospital Stay (HOSPITAL_COMMUNITY)
Admission: RE | Admit: 2024-10-24 | Discharge: 2024-10-25 | Disposition: A | Attending: Cardiovascular Disease | Admitting: Cardiovascular Disease

## 2024-10-24 ENCOUNTER — Inpatient Hospital Stay (HOSPITAL_COMMUNITY)

## 2024-10-24 ENCOUNTER — Encounter (HOSPITAL_COMMUNITY): Admission: RE | Disposition: A | Payer: Self-pay | Attending: Cardiovascular Disease

## 2024-10-24 ENCOUNTER — Other Ambulatory Visit: Payer: Self-pay

## 2024-10-24 ENCOUNTER — Inpatient Hospital Stay (HOSPITAL_COMMUNITY): Payer: Self-pay | Admitting: Physician Assistant

## 2024-10-24 DIAGNOSIS — Z7902 Long term (current) use of antithrombotics/antiplatelets: Secondary | ICD-10-CM | POA: Diagnosis not present

## 2024-10-24 DIAGNOSIS — Z96611 Presence of right artificial shoulder joint: Secondary | ICD-10-CM | POA: Diagnosis present

## 2024-10-24 DIAGNOSIS — Z794 Long term (current) use of insulin: Secondary | ICD-10-CM | POA: Diagnosis not present

## 2024-10-24 DIAGNOSIS — E7800 Pure hypercholesterolemia, unspecified: Secondary | ICD-10-CM | POA: Diagnosis present

## 2024-10-24 DIAGNOSIS — Z833 Family history of diabetes mellitus: Secondary | ICD-10-CM | POA: Diagnosis not present

## 2024-10-24 DIAGNOSIS — Z85828 Personal history of other malignant neoplasm of skin: Secondary | ICD-10-CM

## 2024-10-24 DIAGNOSIS — I2510 Atherosclerotic heart disease of native coronary artery without angina pectoris: Secondary | ICD-10-CM | POA: Diagnosis present

## 2024-10-24 DIAGNOSIS — Z79899 Other long term (current) drug therapy: Secondary | ICD-10-CM

## 2024-10-24 DIAGNOSIS — G4733 Obstructive sleep apnea (adult) (pediatric): Secondary | ICD-10-CM | POA: Diagnosis present

## 2024-10-24 DIAGNOSIS — Z8249 Family history of ischemic heart disease and other diseases of the circulatory system: Secondary | ICD-10-CM | POA: Diagnosis not present

## 2024-10-24 DIAGNOSIS — Z7984 Long term (current) use of oral hypoglycemic drugs: Secondary | ICD-10-CM | POA: Diagnosis not present

## 2024-10-24 DIAGNOSIS — E119 Type 2 diabetes mellitus without complications: Secondary | ICD-10-CM | POA: Diagnosis present

## 2024-10-24 DIAGNOSIS — Z006 Encounter for examination for normal comparison and control in clinical research program: Secondary | ICD-10-CM

## 2024-10-24 DIAGNOSIS — Z7982 Long term (current) use of aspirin: Secondary | ICD-10-CM

## 2024-10-24 DIAGNOSIS — Z952 Presence of prosthetic heart valve: Secondary | ICD-10-CM | POA: Diagnosis not present

## 2024-10-24 DIAGNOSIS — I350 Nonrheumatic aortic (valve) stenosis: Principal | ICD-10-CM | POA: Diagnosis present

## 2024-10-24 DIAGNOSIS — E785 Hyperlipidemia, unspecified: Secondary | ICD-10-CM | POA: Diagnosis present

## 2024-10-24 DIAGNOSIS — I10 Essential (primary) hypertension: Secondary | ICD-10-CM | POA: Diagnosis present

## 2024-10-24 DIAGNOSIS — Z955 Presence of coronary angioplasty implant and graft: Secondary | ICD-10-CM

## 2024-10-24 DIAGNOSIS — Z823 Family history of stroke: Secondary | ICD-10-CM

## 2024-10-24 DIAGNOSIS — Z01818 Encounter for other preprocedural examination: Principal | ICD-10-CM

## 2024-10-24 DIAGNOSIS — K862 Cyst of pancreas: Secondary | ICD-10-CM | POA: Diagnosis present

## 2024-10-24 HISTORY — DX: Nonrheumatic aortic (valve) stenosis: I35.0

## 2024-10-24 HISTORY — PX: INTRAOPERATIVE TRANSTHORACIC ECHOCARDIOGRAM: SHX6523

## 2024-10-24 LAB — POCT I-STAT, CHEM 8
BUN: 14 mg/dL (ref 8–23)
Calcium, Ion: 1.3 mmol/L (ref 1.15–1.40)
Chloride: 102 mmol/L (ref 98–111)
Creatinine, Ser: 0.5 mg/dL (ref 0.44–1.00)
Glucose, Bld: 168 mg/dL — ABNORMAL HIGH (ref 70–99)
HCT: 36 % (ref 36.0–46.0)
Hemoglobin: 12.2 g/dL (ref 12.0–15.0)
Potassium: 4 mmol/L (ref 3.5–5.1)
Sodium: 141 mmol/L (ref 135–145)
TCO2: 25 mmol/L (ref 22–32)

## 2024-10-24 LAB — ABO/RH: ABO/RH(D): A POS

## 2024-10-24 LAB — SURGICAL PCR SCREEN
MRSA, PCR: NEGATIVE
Staphylococcus aureus: NEGATIVE

## 2024-10-24 LAB — GLUCOSE, CAPILLARY
Glucose-Capillary: 200 mg/dL — ABNORMAL HIGH (ref 70–99)
Glucose-Capillary: 131 mg/dL — ABNORMAL HIGH (ref 70–99)
Glucose-Capillary: 108 mg/dL — ABNORMAL HIGH (ref 70–99)
Glucose-Capillary: 199 mg/dL — ABNORMAL HIGH (ref 70–99)

## 2024-10-24 LAB — ECHOCARDIOGRAM LIMITED
AR max vel: 1.64 cm2
AV Area VTI: 1.77 cm2
AV Area mean vel: 1.66 cm2
AV Mean grad: 9 mmHg
AV Peak grad: 16.2 mmHg
Ao pk vel: 2.01 m/s
Area-P 1/2: 3.6 cm2
MV VTI: 1.81 cm2
S' Lateral: 3.4 cm

## 2024-10-24 LAB — POCT ACTIVATED CLOTTING TIME: Activated Clotting Time: 255 s

## 2024-10-24 MED ORDER — CHLORHEXIDINE GLUCONATE 4 % EX SOLN
30.0000 mL | CUTANEOUS | Status: DC
  Filled 2024-10-24: qty 30

## 2024-10-24 MED ORDER — LIDOCAINE HCL (PF) 1 % IJ SOLN
INTRAMUSCULAR | Status: AC
  Filled 2024-10-24: qty 30

## 2024-10-24 MED ORDER — CHLORHEXIDINE GLUCONATE 4 % EX SOLN: 60.0000 mL | Freq: Once | CUTANEOUS | Status: DC

## 2024-10-24 MED ORDER — TRAMADOL HCL 50 MG PO TABS: 50.0000 mg | ORAL_TABLET | ORAL | Status: DC | PRN

## 2024-10-24 MED ORDER — INSULIN ASPART 100 UNIT/ML IJ SOLN
0.0000 [IU] | Freq: Three times a day (TID) | INTRAMUSCULAR | Status: DC
  Administered 2024-10-24 – 2024-10-25 (×2): 4 [IU] via SUBCUTANEOUS
  Filled 2024-10-24 (×2): qty 4

## 2024-10-24 MED ORDER — SODIUM CHLORIDE 0.9% FLUSH: 3.0000 mL | INTRAVENOUS | Status: DC | PRN

## 2024-10-24 MED ORDER — SODIUM CHLORIDE 0.9 % IV SOLN: 250.0000 mL | INTRAVENOUS | Status: DC | PRN

## 2024-10-24 MED ORDER — ACETAMINOPHEN 650 MG RE SUPP: 650.0000 mg | Freq: Four times a day (QID) | RECTAL | Status: DC | PRN

## 2024-10-24 MED ORDER — CHLORHEXIDINE GLUCONATE 0.12 % MT SOLN
15.0000 mL | Freq: Once | OROMUCOSAL | Status: AC
  Administered 2024-10-24: 07:00:00 15 mL via OROMUCOSAL
  Filled 2024-10-24: qty 15

## 2024-10-24 MED ORDER — NOREPINEPHRINE 4 MG/250ML-% IV SOLN: 0.0000 ug/min | INTRAVENOUS | Status: DC

## 2024-10-24 MED ORDER — HEPARIN SODIUM (PORCINE) 1000 UNIT/ML IJ SOLN
INTRAMUSCULAR | Status: DC | PRN
  Administered 2024-10-24: 09:00:00 11000 [IU] via INTRAVENOUS

## 2024-10-24 MED ORDER — ONDANSETRON HCL 4 MG/2ML IJ SOLN: 4.0000 mg | Freq: Four times a day (QID) | INTRAMUSCULAR | Status: DC | PRN

## 2024-10-24 MED ORDER — NITROGLYCERIN IN D5W 200-5 MCG/ML-% IV SOLN: 0.0000 ug/min | INTRAVENOUS | Status: DC

## 2024-10-24 MED ORDER — SODIUM CHLORIDE 0.9 % IV SOLN: INTRAVENOUS | Status: AC

## 2024-10-24 MED ORDER — ACETAMINOPHEN 325 MG PO TABS: 650.0000 mg | ORAL_TABLET | Freq: Four times a day (QID) | ORAL | Status: DC | PRN

## 2024-10-24 MED ORDER — IODIXANOL 320 MG/ML IV SOLN
INTRAVENOUS | Status: DC | PRN
  Administered 2024-10-24: 10:00:00 45 mL via INTRA_ARTERIAL

## 2024-10-24 MED ORDER — ASPIRIN 81 MG PO TBEC
81.0000 mg | DELAYED_RELEASE_TABLET | Freq: Every day | ORAL | Status: DC
  Administered 2024-10-24 – 2024-10-25 (×2): 81 mg via ORAL
  Filled 2024-10-24 (×2): qty 1

## 2024-10-24 MED ORDER — ATORVASTATIN CALCIUM 10 MG PO TABS
20.0000 mg | ORAL_TABLET | Freq: Every day | ORAL | Status: DC
  Administered 2024-10-24: 18:00:00 20 mg via ORAL
  Filled 2024-10-24: qty 2

## 2024-10-24 MED ORDER — CLEVIDIPINE BUTYRATE 0.5 MG/ML IV EMUL
INTRAVENOUS | Status: DC | PRN
  Administered 2024-10-24: 10:00:00 5 mg/h via INTRAVENOUS

## 2024-10-24 MED ORDER — OXYCODONE HCL 5 MG PO TABS: 5.0000 mg | ORAL_TABLET | ORAL | Status: DC | PRN

## 2024-10-24 MED ORDER — INSULIN ASPART 100 UNIT/ML IJ SOLN
0.0000 [IU] | INTRAMUSCULAR | Status: DC | PRN
  Administered 2024-10-24: 07:00:00 2 [IU] via SUBCUTANEOUS
  Filled 2024-10-24: qty 2

## 2024-10-24 MED ORDER — CEFAZOLIN SODIUM-DEXTROSE 2-4 GM/100ML-% IV SOLN
2.0000 g | Freq: Three times a day (TID) | INTRAVENOUS | Status: AC
  Administered 2024-10-24 (×2): 2 g via INTRAVENOUS
  Filled 2024-10-24 (×2): qty 100

## 2024-10-24 MED ORDER — LIDOCAINE HCL (PF) 1 % IJ SOLN
INTRAMUSCULAR | Status: DC | PRN
  Administered 2024-10-24 (×2): 10 mL

## 2024-10-24 MED ORDER — SODIUM CHLORIDE 0.9% FLUSH
3.0000 mL | Freq: Two times a day (BID) | INTRAVENOUS | Status: DC
  Administered 2024-10-24: 18:00:00 3 mL via INTRAVENOUS

## 2024-10-24 MED ORDER — SODIUM CHLORIDE 0.9 % IV SOLN: INTRAVENOUS | Status: DC | PRN

## 2024-10-24 MED ORDER — PROTAMINE SULFATE 10 MG/ML IV SOLN
INTRAVENOUS | Status: DC | PRN
  Administered 2024-10-24: 10:00:00 50 mg via INTRAVENOUS

## 2024-10-24 MED ORDER — SODIUM CHLORIDE 0.9 % IV SOLN: INTRAVENOUS | Status: DC

## 2024-10-24 MED ORDER — MORPHINE SULFATE (PF) 2 MG/ML IV SOLN: 1.0000 mg | INTRAVENOUS | Status: DC | PRN

## 2024-10-24 MED ADMIN — Ondansetron HCl Inj 4 MG/2ML (2 MG/ML): 4 mg | INTRAVENOUS | @ 09:00:00 | NDC 60505613005

## 2024-10-24 NOTE — Op Note (Signed)
 [facility]   MULTIDISCIPLINARY HEART VALVE TEAM   TAVR OPERATIVE NOTE   Date of Procedure:  10/24/2024  Preoperative Diagnosis: Severe Aortic Stenosis   Postoperative Diagnosis: Same   Procedure:   Transcatheter Aortic Valve Replacement - Percutaneous Transfemoral Approach  Edwards Sapien 3 Ultra Resilia THV (size 23 mm, serial # [id number])   Co-Surgeons:  Gloria Rayas, MD and Gloria Fell, MD  Anesthesiologist:  Gloria Mcbride, MD  Echocardiographer:  Gloria Leavens, MD  Pre-operative Echo Findings: Severe aortic stenosis Normal left ventricular systolic function  Post-operative Echo Findings: Trace paravalvular leak Normal/unchanged left ventricular systolic function  BRIEF CLINICAL NOTE AND INDICATIONS FOR SURGERY  78 year old woman who has developed progressive dyspnea on low-level exertion, found to have severe aortic stenosis.  She underwent multidisciplinary heart team review after completing cardiac catheterization, echo, and CTA studies.  She is felt to be a good candidate for transfemoral TAVR.  During the course of the patient's preoperative work up they have been evaluated comprehensively by a multidisciplinary team of specialists coordinated through the Multidisciplinary Heart Valve Clinic in the [facility].  They have been demonstrated to suffer from symptomatic severe aortic stenosis as noted above. The patient has been counseled extensively as to the relative risks and benefits of all options for the treatment of severe aortic stenosis including long term medical therapy, conventional surgery for aortic valve replacement, and transcatheter aortic valve replacement.  The patient has been independently evaluated in formal cardiac surgical consultation by Gloria [name], who deemed the patient appropriate for TAVR. Based upon review of all of the patient's preoperative diagnostic tests they are felt to be candidate  for transcatheter aortic valve replacement using the transfemoral approach as an alternative to conventional surgery.    Following the decision to proceed with transcatheter aortic valve replacement, a discussion has been held regarding what types of management strategies would be attempted intraoperatively in the event of life-threatening complications, including whether or not the patient would be considered a candidate for the use of cardiopulmonary bypass and/or conversion to open sternotomy for attempted surgical intervention.  The patient has been advised of a variety of complications that might develop peculiar to this approach including but not limited to risks of death, stroke, paravalvular leak, aortic dissection or other major vascular complications, aortic annulus rupture, device embolization, cardiac rupture or perforation, acute myocardial infarction, arrhythmia, heart block or bradycardia requiring permanent pacemaker placement, congestive heart failure, respiratory failure, renal failure, pneumonia, infection, other late complications related to structural valve deterioration or migration, or other complications that might ultimately cause a temporary or permanent loss of functional independence or other long term morbidity.  The patient provides full informed consent for the procedure as described and all questions were answered preoperatively.  DETAILS OF THE OPERATIVE PROCEDURE  PREPARATION:   The patient is brought to the operating room on the above mentioned date. The patient is placed in the supine position on the operating table.  Intravenous antibiotics are administered. The patient is monitored closely throughout the procedure under conscious sedation.    Baseline transthoracic echocardiogram is performed. The patient's chest, abdomen, both groins, and both lower extremities are prepared and draped in a sterile manner. A time out procedure is performed.   PERIPHERAL ACCESS:    Using ultrasound guidance, femoral arterial and venous access is obtained with placement of 6 Fr sheaths on the left side.  US  images are digitally captured and stored in the patient's chart. A  pigtail diagnostic catheter was passed through the femoral arterial sheath under fluoroscopic guidance into the aortic root.  A temporary transvenous pacemaker catheter was passed through the femoral venous sheath under fluoroscopic guidance into the right ventricle.  The pacemaker was tested to ensure stable lead placement and pacemaker capture. Aortic root angiography was performed in order to determine the optimal angiographic angle for valve deployment.  TRANSFEMORAL ACCESS:  A micropuncture technique is used to access the right femoral artery under fluoroscopic and ultrasound guidance.  2 Perclose devices are deployed at 10' and 2' positions to 'PreClose' the femoral artery. An 8 French sheath is placed and then an Amplatz Superstiff wire is advanced through the sheath. This is changed out for a 14 French transfemoral E-Sheath after progressively dilating over the Superstiff wire.  An AL-1 catheter was used to direct a straight-tip exchange length wire across the native aortic valve into the left ventricle. This was exchanged out for a pigtail catheter and position was confirmed in the LV apex. Simultaneous LV and Ao pressures were recorded.  The pigtail catheter was exchanged for a Safari wire in the LV apex.    BALLOON AORTIC VALVULOPLASTY:  Not performed  TRANSCATHETER HEART VALVE DEPLOYMENT:  An Edwards Sapien 3 Ultra Resilia transcatheter heart valve (size 23 mm) was prepared and crimped per manufacturer's guidelines, and the proper orientation of the valve is confirmed on the [employer] delivery system. The valve was advanced through the introducer sheath using normal technique until in an appropriate position in the abdominal aorta beyond the sheath tip. The balloon was then retracted and using  the fine-tuning wheel was centered on the valve. The valve was then advanced across the aortic arch using appropriate flexion of the catheter. The valve was carefully positioned across the aortic valve annulus. The Commander catheter was retracted using normal technique. Once final position of the valve has been confirmed by angiographic assessment, the valve is deployed while temporarily holding ventilation and during rapid ventricular pacing to maintain systolic blood pressure < 50 mmHg and pulse pressure < 10 mmHg. The balloon inflation is held for >3 seconds after reaching full deployment volume. Once the balloon has fully deflated the balloon is retracted into the ascending aorta and valve function is assessed using echocardiography. The patient's hemodynamic recovery following valve deployment is good.  The deployment balloon and guidewire are both removed. Echo demostrated acceptable post-procedural gradients, stable mitral valve function, and trace aortic insufficiency.    PROCEDURE COMPLETION:  The sheath was removed and femoral artery closure is performed using the 2 previously deployed Perclose devices.  Protamine  is administered once femoral arterial repair was complete. The site is clear with no evidence of bleeding or hematoma after the sutures are tightened. The temporary pacemaker and pigtail catheters are removed. Mynx closure is used for contralateral femoral arterial hemostasis for the 6 Fr sheath.  The patient tolerated the procedure well and is transported to the recovery area in stable condition. There were no immediate intraoperative complications. All sponge instrument and needle counts are verified correct at completion of the operation.   The patient received a total of 65 mL of intravenous contrast during the procedure.  EBL: minimal  LVEDP: 12 mmHg   Gloria Fell, MD 10/24/2024 3:59 PM

## 2024-10-24 NOTE — Progress Notes (Signed)
 Patient had some oozing from her right femoral site, pressure applied and dressing changed, assessed frequently, new dressing is C/D/I.

## 2024-10-24 NOTE — Op Note (Signed)
 [facility]   MULTIDISCIPLINARY HEART VALVE TEAM     TAVR OPERATIVE NOTE     Date of Procedure:                10/24/2024   Preoperative Diagnosis:      Severe Aortic Stenosis    Postoperative Diagnosis:    Same    Procedure:        Transcatheter Aortic Valve Replacement - Percutaneous Transfemoral Approach             23mm Edwards Sapian 3 Ultra Resilia [id number] .  1ml added.              Co-Surgeons:                        Linnie Rayas, MD and M. Wonda, MD   Anesthesiologist:                  General   Echocardiographer:              EMERSON Boer, MD   Pre-operative Echo Findings: Severe aortic stenosis normal left ventricular systolic function     Post-operative Echo Findings: Trace paravalvular leak normal left ventricular systolic function       BRIEF CLINICAL NOTE AND INDICATIONS FOR SURGERY    78 y.o. female with severe aortic stenosis.  STS score: 3.41.  NYHA Class 2.  The risks and benefits of transfemoral TAVR were discussed in detail.  We also discussed possibility of an emergent sternotomy to address any procedural complications.  Based on our discussion, we collectively decided that an emergent sternotomy would be indicated.  The patient is agreeable to proceed.  Based on my review of her LHC, echo, and CTA, I agree with the multidisciplinary plan to proceed with a 23 mm SAPIEN 3 TAVR.        DETAILS OF THE OPERATIVE PROCEDURE   PREPARATION:     The patient was brought to the operating room on the above mentioned date and appropriate monitoring was established by the anesthesia team. The patient was placed in the supine position on the operating table.  Intravenous antibiotics were administered. The patient was monitored closely throughout the procedure under conscious sedation.   Baseline transthoracic echocardiogram was performed. The patient's abdomen and both groins were prepped and draped in a sterile manner. A time out  procedure was performed.     PERIPHERAL ACCESS:     Using the modified Seldinger technique, femoral arterial was obtained with placement of 6 Fr sheath on the leftside.  A pigtail diagnostic catheter was passed through the arterial sheath under fluoroscopic guidance into the aortic root. Venous access was from the leftvein.  A temporary transvenous pacemaker catheter was passed through the venous sheath under fluoroscopic guidance into the right ventricle.  The pacemaker was tested to ensure stable lead placement and pacemaker capture. Aortic root angiography was performed in order to determine the optimal angiographic angle for valve deployment.     TRANSFEMORAL ACCESS:    Percutaneous transfemoral access and sheath placement was performed using ultrasound guidance.  The right common femoral artery was cannulated using a micropuncture needle and appropriate location was verified using hand injection angiogram.  A pair of Abbott Perclose percutaneous closure devices were placed and a 6 French sheath replaced into the femoral artery.  The patient was heparinized systemically and ACT verified > 250 seconds.     A  14 Fr transfemoral E-sheath was introduced into the right common femoral artery after progressively dilating over an Amplatz superstiff wire. An pigtail catheter was used to direct a straight-tip exchange length wire across the native aortic valve into the left ventricle. This was exchanged out for a pigtail catheter and position was confirmed in the LV apex. Simultaneous LV and Ao pressures were recorded.  The pigtail catheter was exchanged for a Safari wire in the LV apex.          TRANSCATHETER HEART VALVE DEPLOYMENT:    An Celestia Sapian 3 UR transcatheter heart valve (size 23 mm  + 1ml) was prepared and crimped per manufacturer's guidelines, and the proper orientation of the valve is confirmed on the [employer] delivery system. The valve was advanced through the introducer  sheath using normal technique until in an appropriate position in the abdominal aorta beyond the sheath tip. The balloon was then retracted and using the fine-tuning wheel was centered on the valve. The valve was then advanced across the aortic arch using appropriate flexion of the catheter. The valve was carefully positioned across the aortic valve annulus. The Commander catheter was retracted using normal technique. Once final position of the valve has been confirmed by angiographic assessment, the valve is deployed during rapid ventricular pacing to maintain systolic blood pressure < 50 mmHg and pulse pressure < 10 mmHg. The balloon inflation is held for >3 seconds after reaching full deployment volume. Once the balloon has fully deflated the balloon is retracted into the ascending aorta and valve function is assessed using echocardiography. There is felt to be trace paravalvular leak and no central aortic insufficiency.  The patient's hemodynamic recovery following valve deployment is good.  The deployment balloon and guidewire are both removed.      PROCEDURE COMPLETION:    The sheath was removed and femoral artery closure performed.  Protamine  was administered once femoral arterial repair was complete. The temporary pacemaker was removed, pigtail catheter and femoral sheaths were removed with manual pressure used for venous hemostasis.  A Mynx femoral closure device was utilized following removal of the diagnostic sheath in the left femoral artery.   The patient tolerated the procedure well and is transported to the cath lab recovery area in stable condition. There were no immediate intraoperative complications. All sponge instrument and needle counts are verified correct at completion of the operation.    No blood products were administered during the operation.   The patient received a total of 65 mL of intravenous contrast during the procedure.

## 2024-10-24 NOTE — Anesthesia Procedure Notes (Signed)
 Procedure Name: MAC Date/Time: 10/24/2024 8:32 AM  Performed by: Boyce Shilling, CRNAPre-anesthesia Checklist: Patient identified, Emergency Drugs available, Suction available, Timeout performed and Patient being monitored Patient Re-evaluated:Patient Re-evaluated prior to induction Oxygen Delivery Method: Simple face mask Induction Type: IV induction Dental Injury: Teeth and Oropharynx as per pre-operative assessment

## 2024-10-24 NOTE — Discharge Instructions (Signed)

## 2024-10-24 NOTE — Progress Notes (Signed)
°  Echocardiogram 2D Echocardiogram has been performed.  Gloria [name] 10/24/2024, 10:27 AM

## 2024-10-24 NOTE — Progress Notes (Signed)
 Patient bed rest complete and needing to use restroom, ambulated patient to bathroom and back. Upon checking groin sites left groin level 0 and right groin with small ooz, soft otherwise dressing changed to right groin and bed side Rn made aware. Tashima Scarpulla Jessup RN

## 2024-10-24 NOTE — Transfer of Care (Signed)
 Immediate Anesthesia Transfer of Care Note  Patient: Gloria [name]  Procedure(s) Performed: Transcatheter Aortic Valve Replacement, Transfemoral ECHOCARDIOGRAM, TRANSTHORACIC  Patient Location: PACU and Cath Lab  Anesthesia Type:MAC  Level of Consciousness: awake and alert   Airway & Oxygen Therapy: Patient Spontanous Breathing and Patient connected to nasal cannula oxygen  Post-op Assessment: Report given to RN and Post -op Vital signs reviewed and stable  Post vital signs: Reviewed and stable  Last Vitals:  Vitals Value Taken Time  BP 99/62 10/24/24 10:05  Temp 36.4 C 10/24/24 10:05  Pulse 71 10/24/24 10:06  Resp 16 10/24/24 10:06  SpO2 92 % 10/24/24 10:06  Vitals shown include unfiled device data.  Last Pain:  Vitals:   10/24/24 1005  TempSrc: Axillary  PainSc: 0-No pain         Complications: There were no known notable events for this encounter.

## 2024-10-24 NOTE — Interval H&P Note (Signed)
 History and Physical Interval Note:  10/24/2024 6:29 AM  Gloria [name]  has presented today for surgery, with the diagnosis of Severe Aortic Stenosis.  The various methods of treatment have been discussed with the patient and family. After consideration of risks, benefits and other options for treatment, the patient has consented to  Procedures: Transcatheter Aortic Valve Replacement, Transfemoral (N/A) ECHOCARDIOGRAM, TRANSTHORACIC (N/A) as a surgical intervention.  The patient's history has been reviewed, patient examined, no change in status, stable for surgery.  I have reviewed the patient's chart and labs.  Questions were answered to the patient's satisfaction.     Ozell Fell

## 2024-10-24 NOTE — Anesthesia Postprocedure Evaluation (Signed)
 Anesthesia Post Note  Patient: Gloria [name]  Procedure(s) Performed: Transcatheter Aortic Valve Replacement, Transfemoral ECHOCARDIOGRAM, TRANSTHORACIC     Patient location during evaluation: Cath Lab Anesthesia Type: MAC Level of consciousness: awake Pain management: pain level controlled Vital Signs Assessment: post-procedure vital signs reviewed and stable Respiratory status: spontaneous breathing Cardiovascular status: blood pressure returned to baseline Postop Assessment: no apparent nausea or vomiting Anesthetic complications: no   There were no known notable events for this encounter.                  Lauraine DASEN Colhoun

## 2024-10-24 NOTE — Progress Notes (Signed)
 Patient arrived in the unit, she is alert and oriented, V/S obtained, CCMD notified, CHG bath given, has some drainage on righ groin dressing, marked, educated pt regarding bedrest, all needs met, call bell in reach, POC continues.   10/24/24 1155  Vitals  Temp (!) 97.5 F (36.4 C)  Temp Source Oral  BP 125/69  MAP (mmHg) 85  BP Location Right Arm  BP Method Automatic  Patient Position (if appropriate) Lying  Pulse Rate 66  Pulse Rate Source Monitor  ECG Heart Rate 67  Resp 16  Level of Consciousness  Level of Consciousness Alert  MEWS COLOR  MEWS Score Color Green  Oxygen Therapy  SpO2 99 %  O2 Device Room Air  Pain Assessment  Pain Scale 0-10  Pain Score 0  MEWS Score  MEWS Temp 0  MEWS Systolic 0  MEWS Pulse 0  MEWS RR 0  MEWS LOC 0  MEWS Score 0

## 2024-10-24 NOTE — Interval H&P Note (Signed)
 History and Physical Interval Note:  10/24/2024 7:46 AM  Gloria [name] Areola  has presented today for surgery, with the diagnosis of Severe Aortic Stenosis.  The various methods of treatment have been discussed with the patient and family. After consideration of risks, benefits and other options for treatment, the patient has consented to  Procedures: Transcatheter Aortic Valve Replacement, Transfemoral (N/A) ECHOCARDIOGRAM, TRANSTHORACIC (N/A) as a surgical intervention.  The patient's history has been reviewed, patient examined, no change in status, stable for surgery.  I have reviewed the patient's chart and labs.  Questions were answered to the patient's satisfaction.     Leny Morozov MALVA Rayas

## 2024-10-24 NOTE — Progress Notes (Signed)
°  [facility]   MULTIDISCIPLINARY HEART VALVE TEAM  Patient doing well s/p TAVR. She is hemodynamically stable. Groin sites stable. ECG with sinus/LVH. No high grade block. Transferred from cath lab holding to 4E.  Early ambulation after bedrest completed and hopeful discharge over the next 24-48 hours.   Lamarr Hummer PA-C  [facility]  Pager [phone]

## 2024-10-24 NOTE — Anesthesia Preprocedure Evaluation (Addendum)
 Anesthesia Evaluation  Patient identified by MRN, date of birth, ID band  Reviewed: Allergy & Precautions, NPO status , Patient's Chart, lab work & pertinent test results  History of Anesthesia Complications Negative for: history of anesthetic complications  Airway Mallampati: II  TM Distance: >3 FB Neck ROM: Full    Dental  (+) Teeth Intact, Dental Advisory Given, Implants   Pulmonary asthma (Childhood) , sleep apnea (Wears a Mouth Piece)    breath sounds clear to auscultation       Cardiovascular hypertension, Pt. on medications + CAD and + Cardiac Stents (RCA PCI (2021); on Plavix )  + Valvular Problems/Murmurs AS  Rhythm:Regular Rate:Normal + Systolic murmurs TTE (08/2024):  1. Left ventricular ejection fraction, by estimation, is 60 to 65%. Left  ventricular ejection fraction by 3D volume is 62 %. The left ventricle has  normal function. The left ventricle has no regional wall motion  abnormalities. There is moderate  concentric left ventricular hypertrophy. Left ventricular diastolic  parameters are consistent with Grade I diastolic dysfunction (impaired  relaxation). The average left ventricular global longitudinal strain is  -16.3 %.   2. Right ventricular systolic function is normal. The right ventricular  size is normal. There is normal pulmonary artery systolic pressure. The  estimated right ventricular systolic pressure is 34.1 mmHg.   3. Left atrial size was severely dilated.   4. Interatrial septum shifted right, no definite shunt visualized by  color doppler.   5. The mitral valve is normal in structure. Mild mitral valve  regurgitation. No evidence of mitral stenosis.   6. The aortic valve is tricuspid. There is severe calcifcation of the  aortic valve. Aortic valve regurgitation is trivial. Severe aortic valve  stenosis. Aortic valve area, by VTI measures 0.81 cm. Aortic valve mean  gradient measures 56.0 mmHg.    7. The inferior vena cava is normal in size with greater than 50%  respiratory variability, suggesting right atrial pressure of 3 mmHg.     Neuro/Psych    GI/Hepatic   Endo/Other  diabetes, Type 2, Insulin  Dependent   S/p Parathyroidectomy   Renal/GU Renal disease     Musculoskeletal   Abdominal   Peds  Hematology Hgb 15.1, Plts 185K (10/22/24)   Anesthesia Other Findings   Reproductive/Obstetrics                              Anesthesia Physical Anesthesia Plan  ASA: 4  Anesthesia Plan: MAC   Post-op Pain Management:    Induction: Intravenous  PONV Risk Score and Plan: 2 and Treatment may vary due to age or medical condition  Airway Management Planned: Natural Airway and Simple Face Mask  Additional Equipment: Arterial line  Intra-op Plan:   Post-operative Plan:   Informed Consent: I have reviewed the patients History and Physical, chart, labs and discussed the procedure including the risks, benefits and alternatives for the proposed anesthesia with the patient or authorized representative who has indicated his/her understanding and acceptance.     Dental advisory given  Plan Discussed with: CRNA  Anesthesia Plan Comments:          Anesthesia Quick Evaluation

## 2024-10-24 NOTE — Discharge Summary (Incomplete)
 [facility]   MULTIDISCIPLINARY HEART VALVE TEAM  Discharge Summary    Patient ID: Gloria [name] MRN: [id number]; DOB: [birth year]  Admit date: 10/24/2024 Discharge date: 10/25/2024  PCP:  Cleatus Arlyss RAMAN, MD  [facility] Cardiologist:  Wilbert Bihari, MD  [facility] Structural heart: Ozell Fell, MD [facility] Electrophysiologist:  None   Discharge Diagnoses    Principal Problem:   S/P TAVR (transcatheter aortic valve replacement) Active Problems:   Diabetes mellitus without complication (HCC)   Essential hypertension, benign   CAD (coronary artery disease)   OSA (obstructive sleep apnea)   HLD (hyperlipidemia)   Severe aortic stenosis   Allergies Allergies[1]  Diagnostic Studies/Procedures    TAVR OPERATIVE NOTE     Date of Procedure:                10/24/2024   Preoperative Diagnosis:      Severe Aortic Stenosis    Postoperative Diagnosis:    Same    Procedure:        Transcatheter Aortic Valve Replacement - Percutaneous Transfemoral Approach             23mm Edwards Sapian 3 Ultra Resilia [id number] .  1ml added.              Co-Surgeons:                        Linnie Rayas, MD and M. Fell, MD   Anesthesiologist:                  General   Echocardiographer:              EMERSON Boer, MD   Pre-operative Echo Findings: Severe aortic stenosis normal left ventricular systolic function     Post-operative Echo Findings: Trace paravalvular leak normal left ventricular systolic function   _____________    Echo 10/25/24: completed but pending formal read at the time of discharge   History of Present Illness     Gloria [name] is a 78 y.o. female with a history of CAD (RCA PCI 2021), T2DM, HTN, HLD, sleep apnea, and severe AS who presented to [facility] on 10/24/24 for planned TAVR.   The patient developed exertional fatigue and dyspnea. Echocardiogram 08/13/2024 demonstrated EF 60%, mild MR and severe AS with mean grad  56.0 mmHg, Vmax 4.46 m/s, AVA 0.81 cm2. [facility] 10/03/24 showed patent coronary arteries with diffuse calcification and mild nonobstructive disease involving the left main, LAD, left circumflex, and RCA. Patent stent in the mid RCA with no significant restenosis.   The patient was evaluated by the multidisciplinary valve team and felt to have severe, symptomatic aortic stenosis and to be a suitable candidate for TAVR, which was set up for 10/24/24.   Hospital Course     Consultants: none   Severe AS:  -- S/p TAVR with a 23 mm Edwards Sapien 3 Ultra Resilia THV via the TF approach on 10/24/24.  -- Post operative echo completed but pending formal read. -- Groin sites are stable.  -- ECG with sinus and no high grade heart block. -- Continue Aspirin  81 mg daily. -- Met with cardiac rehab to discuss CRP phase II.  -- Plan for discharge home today with close follow up in the outpatient setting.   CAD: -- S/p atherectomy and stenting of severe calcific stenosis of mRCA in 2021. -- Pre TAVR cath 10/03/24 showed patent coronary arteries with diffuse calcification and  mild nonobstructive disease and patent RCA stent. -- Has been on DAPT since PCI in 2021. Discussed with Dr Wonda and pt. No need to continue on DAPT and will drop Plavix  at this time.  -- Continue aspirin  81mg  daily and atorvastatin  20mg  daily.   DMT2:  -- Treated with SSI while admitted.  -- Resume home meds at discharge.  -- Okay to resume Metformin  after 48 hours after contrast dye exposure (12/19 PM).   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 Consideration should be given toward follow-up imaging by CT or MRI in 2 years. -- This will be discussed in the outpatient setting.  _____________  Discharge Vitals Blood pressure 130/63, pulse 79, temperature 98 F (36.7 C), temperature source Oral, resp. rate 17, height 5' 4 (1.626 m), weight 68.4 kg, last menstrual period 08/08/2009,  SpO2 98%.  Filed Weights   10/24/24 0650 10/25/24 0600  Weight: 68 kg 68.4 kg     GEN: Well nourished, well developed in no acute distress NECK: No JVD CARDIAC: RRR, soft flow murmur @ RUSB. No rubs, gallops RESPIRATORY:  Clear to auscultation without rales, wheezing or rhonchi  ABDOMEN: Soft, non-tender, non-distended EXTREMITIES:  No edema; No deformity.  Groin sites clear without hematoma or ecchymosis.    Disposition   Pt is being discharged home today in good condition.  Follow-up Plans & Appointments     Follow-up Information     Sebastian Lamarr SAUNDERS, PA-C Follow up on 10/29/2024.   Specialties: Cardiology, Radiology Why: @ 12:00pm, please arrive at least 20 minutes early Contact information: [address] [place] [address] [phone]                Discharge Instructions     Amb Referral to Cardiac Rehabilitation   Complete by: As directed    Diagnosis: Valve Replacement   Valve: Aortic Comment - TAVR   After initial evaluation and assessments completed: Virtual Based Care may be provided alone or in conjunction with Phase 2 Cardiac Rehab based on patient barriers.: Yes   Intensive Cardiac Rehabilitation (ICR) [facility] location only OR Traditional Cardiac Rehabilitation (TCR) *If criteria for ICR are not met will enroll in TCR [facility] only): Yes       Discharge Medications   Allergies as of 10/25/2024       Reactions   Sulfonamide Derivatives Other (See Comments)   REACTION: unspecified Childhood   Ciprofloxin Hcl [ciprofloxacin ] Rash   Penicillins Rash        Medication List     STOP taking these medications    clopidogrel  75 MG tablet Commonly known as: PLAVIX        TAKE these medications    Accu-Chek Aviva Plus test strip Generic drug: glucose blood CHECK BLOOD SUGAR FOUR TIMES DAILY AS NEEDED   Accu-Chek Aviva Plus w/Device Kit Use to check blood sugar 4 times daily as needed.  Diagnosis:  E11.9  Insulin  Dependent    Accu-Chek FastClix Lancets Misc Use to check blood sugar 4 times daily as needed.  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   aspirin  EC 81 MG tablet Take 1 tablet (81 mg total) by mouth daily. What changed: when to take this   atorvastatin  20 MG tablet Commonly known as: LIPITOR TAKE 1 TABLET BY MOUTH ONCE DAILY What changed: when to take this   BD Pen Needle Short Ultrafine 31G X 8 MM Misc Generic drug: Insulin  Pen Needle Use as directed to  inject insulin  daily   Fish Oil 1000 MG Caps Take 1,000 mg by mouth in the morning.   Lantus  SoloStar 100 UNIT/ML Solostar Pen Generic drug: insulin  glargine Inject 30-35 Units into the skin daily. What changed:  how much to take when to take this   lisinopril  20 MG tablet Commonly known as: ZESTRIL  TAKE ONE TABLET BY MOUTH ONCE DAILY   MAGNESIUM  CITRATE PO Take 250 mg by mouth at bedtime.   metFORMIN  500 MG tablet Commonly known as: GLUCOPHAGE  TAKE ONE TABLET BY MOUTH TWICE DAILY   nitroGLYCERIN  0.4 MG SL tablet Commonly known as: NITROSTAT  Place 1 tablet (0.4 mg total) under the tongue every 5 (five) minutes as needed.   polyethylene glycol powder 17 GM/SCOOP powder Commonly known as: GLYCOLAX /MIRALAX  Take 8.5 g by mouth daily. What changed:  how much to take when to take this reasons to take this   Premarin vaginal cream Generic drug: conjugated estrogens Place 1 applicator vaginally once a week. With weekly pessary usage   vitamin C 1000 MG tablet Take 1,000 mg by mouth in the morning.   Vitamin D  50 MCG (2000 UT) Caps Take 2,000 Units by mouth in the morning.          Outstanding Labs/Studies   none  ______________________  Duration of Discharge Encounter: APP Time: 13 minutes    Signed, Lamarr Hummer, PA-C 10/25/2024, 10:06 AM [phone]  Patient seen, examined. Available data reviewed. Agree with findings, assessment, and plan as outlined by Izetta Hummer, PA-C.  The patient is  independently interviewed and examined this morning.  She is alert, oriented, no distress.  HEENT is normal.  JVP is normal, lungs are clear bilaterally, heart is regular rate and rhythm with a soft ejection murmur at the right upper sternal border, bilateral groin sites are clear with no hematoma or ecchymoses.  There is no lower extremity edema.  The patient's postoperative day #1 echo is reviewed and shows normal LVEF 55 to 60%, mean transvalvular gradient of 12 mmHg with no paravalvular regurgitation.  The patient is medically stable for discharge today.  We have discontinued her clopidogrel  and she will remain on aspirin  81 mg daily.  Otherwise, as outlined above.  MD time spent conducting this discharge today is 20 minutes and includes my personal exam of the patient, counseling regarding discharge instructions, post TAVR restrictions, and review of her echocardiogram and lab results.  Ozell Fell, M.D. 10/25/2024 6:37 PM     [1]  Allergies Allergen Reactions   Sulfonamide Derivatives Other (See Comments)    REACTION: unspecified Childhood   Ciprofloxin Hcl [Ciprofloxacin ] Rash   Penicillins Rash

## 2024-10-24 NOTE — Progress Notes (Signed)
 Site area: left femoral 58F venous sheath Site Prior to Removal:  Level 0 Pressure Applied For: 15 minutes Manual:   yes Patient Status During Pull:  stable Post Pull Site:  Level 0 Post Pull Instructions Given:  yes Post Pull Pulses Present: left dp palpable Dressing Applied:  gauze and tegaderm Bedrest begins @ 1035 Comments:

## 2024-10-25 ENCOUNTER — Encounter (HOSPITAL_COMMUNITY): Payer: Self-pay | Admitting: Cardiovascular Disease

## 2024-10-25 ENCOUNTER — Inpatient Hospital Stay (HOSPITAL_COMMUNITY)

## 2024-10-25 DIAGNOSIS — Z952 Presence of prosthetic heart valve: Secondary | ICD-10-CM

## 2024-10-25 LAB — ECHOCARDIOGRAM COMPLETE
AR max vel: 1.6 cm2
AV Area VTI: 1.7 cm2
AV Area mean vel: 1.69 cm2
AV Mean grad: 12 mmHg
AV Peak grad: 23.2 mmHg
Ao pk vel: 2.41 m/s
Area-P 1/2: 2.87 cm2
Calc EF: 59.2 %
Height: 64 in
MV VTI: 2.06 cm2
S' Lateral: 2.9 cm
Single Plane A2C EF: 59.8 %
Single Plane A4C EF: 59.1 %
Weight: 2412.71 [oz_av]

## 2024-10-25 LAB — BASIC METABOLIC PANEL WITH GFR
Anion gap: 8 (ref 5–15)
BUN: 21 mg/dL (ref 8–23)
CO2: 23 mmol/L (ref 22–32)
Calcium: 8.8 mg/dL — ABNORMAL LOW (ref 8.9–10.3)
Chloride: 105 mmol/L (ref 98–111)
Creatinine, Ser: 0.56 mg/dL (ref 0.44–1.00)
GFR, Estimated: >60 mL/min (ref >60)
Glucose, Bld: 180 mg/dL — ABNORMAL HIGH (ref 70–99)
Potassium: 4 mmol/L (ref 3.5–5.1)
Sodium: 136 mmol/L (ref 135–145)

## 2024-10-25 LAB — CBC
HCT: 38.8 % (ref 36.0–46.0)
Hemoglobin: 13.2 g/dL (ref 12.0–15.0)
MCH: 30.9 pg (ref 26.0–34.0)
MCHC: 34 g/dL (ref 30.0–36.0)
MCV: 90.9 fL (ref 80.0–100.0)
Platelets: 144 K/uL — ABNORMAL LOW (ref 150–400)
RBC: 4.27 MIL/uL (ref 3.87–5.11)
RDW: 13.2 % (ref 11.5–15.5)
WBC: 5.1 K/uL (ref 4.0–10.5)
nRBC: 0 % (ref 0.0–0.2)

## 2024-10-25 LAB — MAGNESIUM: Magnesium: 1.7 mg/dL (ref 1.7–2.4)

## 2024-10-25 LAB — GLUCOSE, CAPILLARY
Glucose-Capillary: 191 mg/dL — ABNORMAL HIGH (ref 70–99)
Glucose-Capillary: 209 mg/dL — ABNORMAL HIGH (ref 70–99)

## 2024-10-25 NOTE — Progress Notes (Signed)
 CARDIAC REHAB PHASE I   Patient seen with Echo at bedside.Patient states she is feeling well and early and ongoing independent ambulation efforts have been w/o difficulties and reports no DOE. Post TAVR education including site care, restrictions, risk factors, heart healthy diet, exercise guidelines, and CRP2 reviewed. All questions and concerns addressed. Patient has attended cardiac rehab before and is eager to return to [facility]. Referral will be placed.   [phone] Isaiah JAYSON [place], RN BSN 10/25/2024 9:12 AM

## 2024-10-25 NOTE — Progress Notes (Signed)
°  Echocardiogram 2D Echocardiogram has been performed.  Nickola Lenig 10/25/2024, 9:53 AM

## 2024-10-25 NOTE — Plan of Care (Signed)
  Problem: Education: Goal: Knowledge of General Education information will improve Description: Including pain rating scale, medication(s)/side effects and non-pharmacologic comfort measures Outcome: Progressing   Problem: Health Behavior/Discharge Planning: Goal: Ability to manage health-related needs will improve Outcome: Progressing   Problem: Clinical Measurements: Goal: Ability to maintain clinical measurements within normal limits will improve Outcome: Progressing Goal: Will remain free from infection Outcome: Progressing Goal: Diagnostic test results will improve Outcome: Progressing Goal: Respiratory complications will improve Outcome: Progressing Goal: Cardiovascular complication will be avoided Outcome: Progressing   Problem: Pain Managment: Goal: General experience of comfort will improve and/or be controlled Outcome: Progressing

## 2024-10-25 NOTE — Progress Notes (Signed)
°   10/25/24 1025  TOC Brief Assessment  Insurance and Status Reviewed  Patient has primary care physician Yes  Home environment has been reviewed home  Prior level of function: self  Prior/Current Home Services No current home services  Social Drivers of Health Review SDOH reviewed no interventions necessary  Readmission risk has been reviewed Yes  Transition of care needs no transition of care needs at this time    Pt s/p TAVR, stable for transition home today, no HH or DME needs noted.

## 2024-10-26 ENCOUNTER — Telehealth: Payer: Self-pay

## 2024-10-26 NOTE — Telephone Encounter (Signed)
 Patient contacted regarding discharge from [facility] on 10/25/24  Patient understands to follow up with provider Izetta Hummer, PA-C on 10/29/24 at 12:00PM at [address] Location. Patient understands discharge instructions? Yes Patient understands medications and regiment? Yes Patient understands to bring all medications to this visit? Yes

## 2024-10-29 ENCOUNTER — Inpatient Hospital Stay: Admitting: Physician Assistant

## 2024-10-29 VITALS — BP 120/66 | HR 75 | Ht 64.0 in | Wt 151.4 lb

## 2024-10-29 DIAGNOSIS — I2510 Atherosclerotic heart disease of native coronary artery without angina pectoris: Secondary | ICD-10-CM | POA: Diagnosis not present

## 2024-10-29 DIAGNOSIS — Z952 Presence of prosthetic heart valve: Secondary | ICD-10-CM | POA: Diagnosis not present

## 2024-10-29 DIAGNOSIS — K862 Cyst of pancreas: Secondary | ICD-10-CM | POA: Diagnosis not present

## 2024-10-29 MED ORDER — AMOXICILLIN 500 MG PO TABS: 2000.0000 mg | ORAL_TABLET | ORAL | 12 refills | Status: DC

## 2024-10-29 NOTE — Progress Notes (Unsigned)
 " [facility]   MULTIDISCIPLINARY HEART VALVE CLINIC                                     Cardiology Office Note:    Date:  10/30/2024   ID:  Gloria [name], DOB 1946/03/02, MRN [id number]  PCP:  Cleatus Arlyss RAMAN, MD  [facility] Cardiologist:  Ozell Fell, MD  [facility] Structural heart: Ozell Fell, MD [facility] Electrophysiologist:  None   Referring MD: Cleatus Arlyss RAMAN, MD   [facility] s/p TAVR  History of Present Illness:    Gloria [name] is a 78 y.o. 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   The patient developed exertional fatigue and dyspnea. Echocardiogram 08/13/2024 demonstrated EF 60%, mild MR and severe AS with mean grad 56.0 mmHg, Vmax 4.46 m/s, AVA 0.81 cm2. [facility] 10/03/24 showed patent coronary arteries with diffuse calcification and mild nonobstructive disease involving the left main, LAD, left circumflex, and RCA. Patent stent in the mid RCA with no significant restenosis. S/p TAVR with a 23 mm Edwards Sapien 3 Ultra Resilia THV via the TF approach on 10/24/24. Post operative echo showed EF 55%, mild cLVH, mod pulm HTN, mod MAC, mild-mod TR, normally functioning TAVR with a mean gradient of 12 mmHg and no PVL.  Today the patient presents to clinic for follow up. Here with her husband. Feeling great. Very thankful for her care during her TAVR work up and treatment. No CP or SOB. No LE edema, orthopnea or PND. No dizziness or syncope. No blood in stool or urine. No palpitations.     Past Medical History:  Diagnosis Date   Allergic rhinitis    Arthritis    Asthma    as a chld   Coronary artery disease    Diabetes mellitus without complication (HCC) 05/24/2007   Qualifier: Diagnosis of  By: Bartley MD, Lamar Mulch    Female cystocele 08/10/2012   Hemorrhoids    History of kidney stones    Hypertension    Left shoulder pain 07/20/2016    MENOPAUSE, SURGICAL 12/16/2009   Qualifier: Diagnosis of  By: Henrietta LPN, Regina     Pure hypercholesterolemia 03/30/2011   RHINITIS 12/24/2010   Qualifier: Diagnosis of  By: Cleatus MD, Arlyss KUGEL 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 Fell and Dr. Shyrl   Severe aortic stenosis    Sleep apnea    wears mouthpiece     Current Medications: Active Medications[1]    ROS:   Please see the history of present illness.    All other systems reviewed and are negative.  EKGs   EKG Interpretation Date/Time:  Monday October 29 2024 12:01:53 EST Ventricular Rate:  75 PR Interval:  128 QRS Duration:  92 QT Interval:  382 QTC Calculation: 426 R Axis:   38  Text Interpretation: Normal sinus rhythm Possible Left atrial enlargement Confirmed by Sebastian Collar [phone]) on 10/29/2024 12:15:43 PM   Risk Assessment/Calculations:           Physical Exam:    VS:  BP 120/66   Pulse 75   Ht 5' 4 (1.626 m)  Wt 151 lb 6.4 oz (68.7 kg)   LMP 08/08/2009   SpO2 97%   BMI 25.99 kg/m     Wt Readings from Last 3 Encounters:  10/29/24 151 lb 6.4 oz (68.7 kg)  10/25/24 150 lb 12.7 oz (68.4 kg)  10/19/24 150 lb (68 kg)     GEN: Well nourished, well developed in no acute distress NECK: No JVD CARDIAC: RRR, soft ejection murmur @ RUSB. No rubs, gallops RESPIRATORY:  Clear to auscultation without rales, wheezing or rhonchi  ABDOMEN: Soft, non-tender, non-distended EXTREMITIES:  No edema; No deformity.  Groin sites clear without hematoma or ecchymosis.   ASSESSMENT:    1. S/P TAVR (transcatheter aortic valve replacement)   2. Coronary artery disease involving native coronary artery of native heart without angina pectoris   3. Cyst of pancreas     PLAN:    In order of problems listed above:  Severe AS s/p TAVR:  -- Pt doing excellent s/p TAVR.  -- ECG with no HAVB.   -- Groin sites healing well.  -- SBE prophylaxis discussed; I have RX'd amoxicillin . .  -- Continue Aspirin  81mg  daily. -- Cleared to resume all activities without restriction. -- I will see back for 1 month echo and OV.  CAD: -- S/p atherectomy and stenting of severe calcific stenosis of mRCA in 2021. -- Pre TAVR cath 10/03/24 showed patent coronary arteries with diffuse calcification and mild nonobstructive disease and patent RCA stent. -- Continue aspirin  81mg  daily and atorvastatin  20mg  daily.    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 Consideration should be given toward follow-up imaging by CT or MRI in 2 years. -- This was discussed with her and printed for her to follow up with Dr. Cleatus about.    Cardiac Rehabilitation Eligibility Assessment  The patient is ready to start cardiac rehabilitation from a cardiac standpoint.    Medication Adjustments/Labs and Tests Ordered: Current medicines are reviewed at length with the patient today.  Concerns regarding medicines are outlined above.  Orders Placed This Encounter  Procedures   EKG 12-Lead   ECHOCARDIOGRAM COMPLETE   Meds ordered this encounter  Medications   amoxicillin  (AMOXIL ) 500 MG tablet    Sig: Take 4 tablets (2,000 mg total) by mouth as directed. 1 hour prior to dental work including cleanings    Dispense:  12 tablet    Refill:  12    Supervising Provider:   WONDA SHARPER [[id number]]    Patient Instructions  Medication Instructions:  Your physician has recommended you make the following change in your medication:  START Amoxicillin  500 mg, take 4 tablets by mouth 1 hour prior to dental procedures and cleanings.    *If you need a refill on your cardiac medications before your next appointment, please call your pharmacy*  Lab Work: None needed If you have labs (blood work) drawn today and your tests are completely normal, you will receive your results only  by: MyChart Message (if you have MyChart) OR A paper copy in the mail If you have any lab test that is abnormal or we need to change your treatment, we will call you to review the results.  Testing/Procedures:12/03/24 Your physician has requested that you have an echocardiogram. Echocardiography is a painless test that uses sound waves to create images of your heart. It provides your doctor with information about the size and shape of your heart and how well your hearts chambers and  valves are working. This procedure takes approximately one hour. There are no restrictions for this procedure. Please do NOT wear cologne, perfume, aftershave, or lotions (deodorant is allowed). Please arrive 15 minutes prior to your appointment time.  Please note: We ask at that you not bring children with you during ultrasound (echo/ vascular) testing. Due to room size and safety concerns, children are not allowed in the ultrasound rooms during exams. Our front office staff cannot provide observation of children in our lobby area while testing is being conducted. An adult accompanying a patient to their appointment will only be allowed in the ultrasound room at the discretion of the ultrasound technician under special circumstances. We apologize for any inconvenience.   Follow-Up: At [facility], you and your health needs are our priority.  As part of our continuing mission to provide you with exceptional heart care, our providers are all part of one team.  This team includes your primary Cardiologist (physician) and Advanced Practice Providers or APPs (Physician Assistants and Nurse Practitioners) who all work together to provide you with the care you need, when you need it.  Your next appointment:   As scheduled on 12/03/24  Provider:   Izetta Hummer, PA-C  We recommend signing up for the patient portal called MyChart.  Sign up information is provided on this After Visit Summary.  MyChart is used to  connect with patients for Virtual Visits (Telemedicine).  Patients are able to view lab/test results, encounter notes, upcoming appointments, etc.  Non-urgent messages can be sent to your provider as well.   To learn more about what you can do with MyChart, go to [email or web address].          Signed, Lamarr Hummer, PA-C  10/30/2024 9:49 AM    [facility]     [1]  Current Meds  Medication Sig   ACCU-CHEK AVIVA PLUS test strip CHECK BLOOD SUGAR FOUR TIMES DAILY AS NEEDED   ACCU-CHEK FASTCLIX LANCETS MISC Use to check blood sugar 4 times daily as needed.  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   amoxicillin  (AMOXIL ) 500 MG tablet Take 4 tablets (2,000 mg total) by mouth as directed. 1 hour prior to dental work including cleanings   Ascorbic Acid (VITAMIN C) 1000 MG tablet Take 1,000 mg by mouth in the morning.   aspirin  EC 81 MG tablet Take 1 tablet (81 mg total) by mouth daily.   atorvastatin  (LIPITOR) 20 MG tablet TAKE 1 TABLET BY MOUTH ONCE DAILY   Blood Glucose Monitoring Suppl (ACCU-CHEK AVIVA PLUS) W/DEVICE KIT Use to check blood sugar 4 times daily as needed.  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   conjugated estrogens (PREMARIN) vaginal cream Place 1 applicator vaginally once a week. With weekly pessary usage   insulin  glargine (LANTUS  SOLOSTAR) 100 UNIT/ML Solostar Pen Inject 30-35 Units into the skin daily.   Insulin  Pen Needle (BD PEN NEEDLE SHORT ULTRAFINE) 31G X 8 MM MISC Use as directed to inject insulin  daily   lisinopril  (ZESTRIL ) 20 MG tablet TAKE ONE TABLET BY MOUTH ONCE DAILY   MAGNESIUM  CITRATE PO Take 250 mg by mouth at bedtime.   metFORMIN  (GLUCOPHAGE ) 500 MG tablet TAKE ONE TABLET BY MOUTH TWICE DAILY   nitroGLYCERIN  (NITROSTAT ) 0.4 MG SL tablet Place 1 tablet (0.4 mg total) under the tongue every 5 (five) minutes as needed.   Omega-3 Fatty  Acids (FISH OIL) 1000 MG CAPS Take 1,000 mg by mouth in the morning.   polyethylene glycol powder (GLYCOLAX /MIRALAX ) 17 GM/SCOOP powder Take 8.5 g by mouth daily.   "

## 2024-10-29 NOTE — Patient Instructions (Signed)
 Medication Instructions:  Your physician has recommended you make the following change in your medication:  START Amoxicillin  500 mg, take 4 tablets by mouth 1 hour prior to dental procedures and cleanings.    *If you need a refill on your cardiac medications before your next appointment, please call your pharmacy*  Lab Work: None needed If you have labs (blood work) drawn today and your tests are completely normal, you will receive your results only by: MyChart Message (if you have MyChart) OR A paper copy in the mail If you have any lab test that is abnormal or we need to change your treatment, we will call you to review the results.  Testing/Procedures:12/03/24 Your physician has requested that you have an echocardiogram. Echocardiography is a painless test that uses sound waves to create images of your heart. It provides your doctor with information about the size and shape of your heart and how well your hearts chambers and valves are working. This procedure takes approximately one hour. There are no restrictions for this procedure. Please do NOT wear cologne, perfume, aftershave, or lotions (deodorant is allowed). Please arrive 15 minutes prior to your appointment time.  Please note: We ask at that you not bring children with you during ultrasound (echo/ vascular) testing. Due to room size and safety concerns, children are not allowed in the ultrasound rooms during exams. Our front office staff cannot provide observation of children in our lobby area while testing is being conducted. An adult accompanying a patient to their appointment will only be allowed in the ultrasound room at the discretion of the ultrasound technician under special circumstances. We apologize for any inconvenience.   Follow-Up: At [facility], you and your health needs are our priority.  As part of our continuing mission to provide you with exceptional heart care, our providers are all part of one team.   This team includes your primary Cardiologist (physician) and Advanced Practice Providers or APPs (Physician Assistants and Nurse Practitioners) who all work together to provide you with the care you need, when you need it.  Your next appointment:   As scheduled on 12/03/24  Provider:   Izetta Hummer, PA-C  We recommend signing up for the patient portal called MyChart.  Sign up information is provided on this After Visit Summary.  MyChart is used to connect with patients for Virtual Visits (Telemedicine).  Patients are able to view lab/test results, encounter notes, upcoming appointments, etc.  Non-urgent messages can be sent to your provider as well.   To learn more about what you can do with MyChart, go to [email or web address].

## 2024-10-30 ENCOUNTER — Ambulatory Visit: Payer: Self-pay | Admitting: Family Medicine

## 2024-11-04 ENCOUNTER — Other Ambulatory Visit: Payer: Self-pay

## 2024-11-04 ENCOUNTER — Encounter: Payer: Self-pay | Admitting: Emergency Medicine

## 2024-11-04 ENCOUNTER — Emergency Department
Admission: EM | Admit: 2024-11-04 | Discharge: 2024-11-04 | Disposition: A | Discharge status: Home / Self Care | Attending: Emergency Medicine | Admitting: Emergency Medicine

## 2024-11-04 DIAGNOSIS — E119 Type 2 diabetes mellitus without complications: Secondary | ICD-10-CM | POA: Diagnosis not present

## 2024-11-04 DIAGNOSIS — R300 Dysuria: Secondary | ICD-10-CM | POA: Diagnosis present

## 2024-11-04 DIAGNOSIS — I2510 Atherosclerotic heart disease of native coronary artery without angina pectoris: Secondary | ICD-10-CM | POA: Diagnosis not present

## 2024-11-04 DIAGNOSIS — I10 Essential (primary) hypertension: Secondary | ICD-10-CM | POA: Diagnosis not present

## 2024-11-04 DIAGNOSIS — N3001 Acute cystitis with hematuria: Secondary | ICD-10-CM | POA: Diagnosis not present

## 2024-11-04 LAB — URINALYSIS, ROUTINE W REFLEX MICROSCOPIC
Bilirubin Urine: NEGATIVE
Glucose, UA: NEGATIVE mg/dL
Ketones, ur: NEGATIVE mg/dL
Nitrite: NEGATIVE
Protein, ur: 100 mg/dL — AB
RBC / HPF: >50 RBC/hpf (ref 0–5)
Specific Gravity, Urine: 1.014 (ref 1.005–1.030)
WBC, UA: >50 WBC/hpf (ref 0–5)
pH: 6 (ref 5.0–8.0)

## 2024-11-04 MED ORDER — CEPHALEXIN 500 MG PO CAPS: 500.0000 mg | ORAL_CAPSULE | Freq: Four times a day (QID) | ORAL | 0 refills | Status: DC

## 2024-11-04 MED ORDER — CEFTRIAXONE SODIUM 1 G IJ SOLR
500.0000 mg | Freq: Once | INTRAMUSCULAR | Status: AC
  Administered 2024-11-04: 500 mg via INTRAMUSCULAR
  Filled 2024-11-04: qty 10

## 2024-11-04 NOTE — ED Triage Notes (Signed)
 Pt reports urinary frequency and pain starting this morning.

## 2024-11-04 NOTE — ED Notes (Signed)
 Pt states no pain at this time, only hurts when trying to void. Pt has no needs currently. Informed pt urine specimen was found, awaiting results.

## 2024-11-04 NOTE — ED Provider Notes (Signed)
 "   [facility] Emergency Department Provider Note     Event Date/Time   First MD Initiated Contact with Patient 11/04/24 1758     (approximate)   History   Dysuria   HPI  Gloria [name] is a 78 y.o. female with a history of HTN, DM type II, OSA, CAD, and kidney stones, presents to the ED endorsing urinary frequency and pain with onset this morning.  Patient denies any fevers, chills, sweats.  No urinary retention or gross hematuria reported.  Patient denies any night pain which is what she has experienced the last time she passed a kidney stone which was approximately 15 years prior.   Physical Exam   Triage Vital Signs: ED Triage Vitals [11/04/24 1601]  Encounter Vitals Group     BP (!) 179/82     Girls Systolic BP Percentile      Girls Diastolic BP Percentile      Boys Systolic BP Percentile      Boys Diastolic BP Percentile      Pulse Rate 88     Resp 16     Temp 98.2 F (36.8 C)     Temp Source Oral     SpO2 98 %     Weight 149 lb 14.6 oz (68 kg)     Height 5' 4 (1.626 m)     Head Circumference      Peak Flow      Pain Score 0     Pain Loc      Pain Education      Exclude from Growth Chart     Most recent vital signs: Vitals:   11/04/24 1601  BP: (!) 179/82  Pulse: 88  Resp: 16  Temp: 98.2 F (36.8 C)  SpO2: 98%    General Awake, no distress.  NAD HEENT NCAT. PERRL. EOMI. No rhinorrhea. Mucous membranes are moist.  CV:  Good peripheral perfusion.  RRR RESP:  Normal effort.  CTA ABD:  No distention.  Soft nontender.  No CVA tenderness elicited GU:  deferred   ED Results / Procedures / Treatments   Labs (all labs ordered are listed, but only abnormal results are displayed) Labs Reviewed  URINALYSIS, ROUTINE W REFLEX MICROSCOPIC - Abnormal; Notable for the following components:      Result Value   Color, Urine YELLOW (*)    APPearance CLOUDY (*)    Hgb urine dipstick LARGE (*)    Protein, ur 100 (*)    Leukocytes,Ua  LARGE (*)    Bacteria, UA RARE (*)    All other components within normal limits  URINE CULTURE     EKG   RADIOLOGY   No results found.   PROCEDURES:  Critical Care performed: No  Procedures   MEDICATIONS ORDERED IN ED: Medications  cefTRIAXone  (ROCEPHIN ) injection 500 mg (500 mg Intramuscular Given 11/04/24 1915)     IMPRESSION / MDM / ASSESSMENT AND PLAN / ED COURSE  I reviewed the triage vital signs and the nursing notes.                              Differential diagnosis includes, but is not limited to, UTI, nephrolithiasis, pyelonephritis  Patient's presentation is most consistent with acute complicated illness / injury requiring diagnostic workup.  Patient's diagnosis is consistent with acute cystitis.  Geriatric patient presented to the ED in no acute distress, with onset of dysuria, hematuria,  and frequency this morning.  Patient is stable without toxic appearance with reassuring vital signs at this time.  UA confirms bacteriuria, hematuria, and evidence of pyuria.  I discussed with patient the option for admission, but she feels stable at this time and she can manage her symptoms without difficulty.  She will be given an empiric dose of Rocephin  here in the ED.  Patient will be discharged home with prescriptions for Keflex . Patient is to follow up with her primary provider as suggested, as needed or otherwise directed. Patient is given ED precautions to return to the ED for any worsening or new symptoms.   FINAL CLINICAL IMPRESSION(S) / ED DIAGNOSES   Final diagnoses:  Dysuria  Acute cystitis with hematuria     Rx / DC Orders   ED Discharge Orders          Ordered    cephALEXin  (KEFLEX ) 500 MG capsule  4 times daily        11/04/24 1923             Note:  This document was prepared using Dragon voice recognition software and may include unintentional dictation errors.    Loyd Candida LULLA Aldona, PA-C 11/04/24 1958    Bradler, Evan K,  MD 11/10/24 0715  "

## 2024-11-04 NOTE — Discharge Instructions (Addendum)
 Take antibiotic as directed.  Continue to monitor and treat any symptoms, and return to the ED as discussed.  Follow-up with your primary provider for ongoing evaluation.  You be notified if your urine culture results warrant a change to your antibiotic therapy.

## 2024-11-05 ENCOUNTER — Encounter: Payer: Self-pay | Admitting: Family Medicine

## 2024-11-06 ENCOUNTER — Ambulatory Visit: Payer: Self-pay

## 2024-11-06 NOTE — Telephone Encounter (Signed)
 FYI Only or Action Required?: Action required by provider: patient reports possible side effects with antibotics .  Gloria [name] was last seen in primary care on 08/21/2024 by Gloria Arlyss RAMAN, MD.  Called Nurse Triage reporting Medication Reaction.  Symptoms began several days ago.  Interventions attempted: Nothing.  Symptoms are: stable.  Triage Disposition: Call PCP When Office is Open  Gloria [name]/caregiver understands and will follow disposition?: Yes          >> Nov 06, 2024 12:22 PM Gloria [name] wrote: Pt is returning a missed call from NT regarding this concern.  Reason for Disposition  [1] Caller has NON-URGENT medicine question about med that PCP prescribed AND [2] triager unable to answer question  Answer Assessment - Initial Assessment Questions Had a procedure TVAR (replacement of aortic valve) 12/17. Sunday morning when got up was feeling fine, 10 AM went to grocery and lost control bladder leakage. Went home cleaned up. Ate lunch. 1 PM it burned to urinate. Took nap. Got up had to urinate again. After that again right after the other.  Tried to go UC  12/28 they closed , went to ER . Had urine test at ER. Noted that day stings of red blood in it. ER told yes had UTI. ER wanted to do rocephin  IV Gloria [name] got that IM in hip. Got 5 days keflex  per chart prescription took first dose 12/29 at [facility] noticed pain in R foot, spouse inspected and toes streaks across them red. Back of toes and ankles red streaking. Does report allergic to penicillin.    Currently has the red streaks on toes and right foot slight swelling across toes not up and down and red spotting on the top of foot close to toes no pain . Wondering if antibiotic related. Has appointment tomorrow 920 AM. Right now not having the urinary burning or blood. Today took 1  pill this am , and wants to know if needs to switch or continue this medication next dose due 230 PM   Gloria [name] has appointment already scheduled tomorrow 920  AM. Will route to office to review patients concerns about the keflex  Gloria [name] wondering if needs new medication        1. NAME of MEDICINE: What medicine(s) are you calling about?     Keflex  given by ER 12/28 for UTI  2. QUESTION: What is your question? (e.g., double dose of medicine, side effect)     Concerned  3. PRESCRIBER: Who prescribed the medicine? Reason: if prescribed by specialist, call should be referred to that group.     ER 12/28 4. SYMPTOMS: Do you have any symptoms? If Yes, ask: What symptoms are you having?  How bad are the symptoms (e.g., mild, moderate, severe)     Right foot with redness and red streaks across the toes is concerned the keflex  is causing this and is weary to continue it for the UTI   Gloria [name] denies the following  chest pain, difficulty breathing , fever, abdominal pain, blood in urine  Protocols used: Medication Question Call-A-AH   Copied from CRM #[id number]. Topic: Clinical - Medical Advice >> Nov 06, 2024  7:58 AM Ahlexyia S wrote: Reason for CRM: Pt called in stating that ever since she started taking the 3rd pill of cephALEXin  (KEFLEX ) 500 MG capsule she started experiencing some red specs on the bottom on the tip of her toes on her right foot. Pt also mentioned that its slightly swollen as well. Pt was diagnosed with a UTI  at the hospital and was prescribed this medication and received a injection called rocephin . Pt did mentioned that she is allergic to penicillin. Pt would have liked to schedule an appointment but schedule is booked out until this Friday. She is unable to wait this long due to her not being able to just not take this current med as it was prescribed for UTI diagnosis. Pt is requesting a callback regarding this.

## 2024-11-06 NOTE — Telephone Encounter (Signed)
 Duplicate call; pt scheduled 11/07/24. NO further questions or concerns  Copied from CRM #[id number]. Topic: Clinical - Medical Advice >> Nov 06, 2024  7:58 AM Gloria [name] wrote: Reason for CRM: Pt called in stating that ever since she started taking the 3rd pill of cephALEXin  (KEFLEX ) 500 MG capsule she started experiencing some red specs on the bottom on the tip of her toes on her right foot. Pt also mentioned that its slightly swollen as well. Pt was diagnosed with a UTI at the hospital and was prescribed this medication and received a injection called rocephin . Pt did mentioned that she is allergic to penicillin. Pt would have liked to schedule an appointment but schedule is booked out until this Friday. She is unable to wait this long due to her not being able to just not take this current med as it was prescribed for UTI diagnosis. Pt is requesting a callback regarding this.   Reason for Disposition  Caller has already spoken with another triager and has no further questions.  Protocols used: No Contact or Duplicate Contact Call-A-AH

## 2024-11-06 NOTE — Telephone Encounter (Signed)
 Spoke with Gloria [name] and she has been scheduled to see Matt on 11/07/24 at 0920.

## 2024-11-06 NOTE — Telephone Encounter (Signed)
 First attempt to contact patient.  LVM for patient to return call to [phone] . Placed in callbacks for additional attempts  Copied from CRM #[id number]. Topic: Clinical - Medical Advice >> Nov 06, 2024  7:58 AM Gloria [name] wrote: Reason for CRM: Pt called in stating that ever since she started taking the 3rd pill of cephALEXin  (KEFLEX ) 500 MG capsule she started experiencing some red specs on the bottom on the tip of her toes on her right foot. Pt also mentioned that its slightly swollen as well. Pt was diagnosed with a UTI at the hospital and was prescribed this medication and received a injection called rocephin . Pt did mentioned that she is allergic to penicillin. Pt would have liked to schedule an appointment but schedule is booked out until this Friday. She is unable to wait this long due to her not being able to just not take this current med as it was prescribed for UTI diagnosis. Pt is requesting a callback regarding this.

## 2024-11-07 ENCOUNTER — Ambulatory Visit: Admitting: Nurse Practitioner

## 2024-11-07 ENCOUNTER — Ambulatory Visit
Admission: RE | Admit: 2024-11-07 | Discharge: 2024-11-07 | Disposition: A | Discharge status: Home / Self Care | Source: Ambulatory Visit | Attending: Nurse Practitioner | Admitting: Nurse Practitioner

## 2024-11-07 VITALS — BP 132/80 | HR 72 | Temp 98.1°F | Ht 64.0 in | Wt 152.8 lb

## 2024-11-07 DIAGNOSIS — Z09 Encounter for follow-up examination after completed treatment for conditions other than malignant neoplasm: Secondary | ICD-10-CM | POA: Diagnosis not present

## 2024-11-07 DIAGNOSIS — R21 Rash and other nonspecific skin eruption: Secondary | ICD-10-CM

## 2024-11-07 DIAGNOSIS — D696 Thrombocytopenia, unspecified: Secondary | ICD-10-CM | POA: Diagnosis not present

## 2024-11-07 DIAGNOSIS — N3090 Cystitis, unspecified without hematuria: Secondary | ICD-10-CM

## 2024-11-07 DIAGNOSIS — M79672 Pain in left foot: Secondary | ICD-10-CM

## 2024-11-07 LAB — CBC WITH DIFFERENTIAL/PLATELET
Basophils Absolute: 0 K/uL (ref 0.0–0.1)
Basophils Relative: 0.8 % (ref 0.0–3.0)
Eosinophils Absolute: 0.1 K/uL (ref 0.0–0.7)
Eosinophils Relative: 2.5 % (ref 0.0–5.0)
HCT: 40 % (ref 36.0–46.0)
Hemoglobin: 13.7 g/dL (ref 12.0–15.0)
Lymphocytes Relative: 27.2 % (ref 12.0–46.0)
Lymphs Abs: 1.3 K/uL (ref 0.7–4.0)
MCHC: 34.2 g/dL (ref 30.0–36.0)
MCV: 91 fl (ref 78.0–100.0)
Monocytes Absolute: 0.4 K/uL (ref 0.1–1.0)
Monocytes Relative: 9.1 % (ref 3.0–12.0)
Neutro Abs: 2.8 K/uL (ref 1.4–7.7)
Neutrophils Relative %: 60.4 % (ref 43.0–77.0)
Platelets: 146 K/uL — ABNORMAL LOW (ref 150.0–400.0)
RBC: 4.39 Mil/uL (ref 3.87–5.11)
RDW: 14.3 % (ref 11.5–15.5)
WBC: 4.7 K/uL (ref 4.0–10.5)

## 2024-11-07 LAB — POC URINALSYSI DIPSTICK (AUTOMATED)
Bilirubin, UA: NEGATIVE
Blood, UA: NEGATIVE
Glucose, UA: NEGATIVE
Ketones, UA: NEGATIVE
Leukocytes, UA: NEGATIVE
Nitrite, UA: NEGATIVE
Protein, UA: NEGATIVE
Spec Grav, UA: 1.025
Urobilinogen, UA: 0.2 U/dL
pH, UA: 6

## 2024-11-07 LAB — URINE CULTURE
Culture: >=100000 — AB
Special Requests: NORMAL

## 2024-11-07 MED ORDER — NITROFURANTOIN MONOHYD MACRO 100 MG PO CAPS: 100.0000 mg | ORAL_CAPSULE | Freq: Two times a day (BID) | ORAL | 0 refills | Status: DC

## 2024-11-07 NOTE — Progress Notes (Signed)
 "  Established Patient Office Visit  Subjective   Patient ID: Gloria [name], female    DOB: June 24, 1946  Age: 78 y.o. MRN: [id number]  Chief Complaint  Patient presents with   Follow-up   Foot Problem    Pt complains of redness/rash/blotches on her right foot that started on Monday. Pt complains of taking antibiotic for UTI on Monday. Pt is unsure if she is having a reaction from the medication.     HPI  Patient was seen emergency department on 11/04/2024 for urinary frequency and pain onset that morning prior to arrival she did have a remote history of kidney stones approximately 15 years prior.  Patient urine showed large hemoglobin protein leukocytes and bacteria.  He was offered admission but declined as she felt she could handle it outpatient.  Patient was given empiric dose of Rocephin  in the ED and discharged on Keflex .  Patient is here for follow-up  Discussed the use of AI scribe software for clinical note transcription with the patient, who gave verbal consent to proceed.  History of Present Illness Gloria [name] is a 78 year old female who presents with concerns of a possible allergic reaction to antibiotics following a urinary tract infection.  She was seen in the emergency department on December 28th for symptoms consistent with a urinary tract infection, including increased frequency, urgency, burning upon urination, and hematuria, which led to an accident at the grocery store. She received a dose of Rocephin  and was prescribed Keflex  to continue at home.  After taking three doses of Keflex , she developed tenderness on the bottom of her foot, streaks across her toes, a small tender spot on her ankle, and little dots across her foot. These symptoms worsened by Tuesday morning, prompting her to stop taking the antibiotic due to fear of an allergic reaction. She has a known allergy to penicillin and Cipro , raising concerns about cross-reactivity with cephalosporins like  Keflex .  Despite these concerns, her urinary symptoms improved significantly after the initial treatment, with no lower abdominal pain, dysuria, or hematuria reported since the initial dose of Rocephin . Her urine culture from the hospital showed sensitivity to other antibiotics, providing alternative treatment options.  She underwent a TAVR procedure on December 17th and had a follow-up on December 22nd. She is cautious about avoiding infections post-procedure. She recalls hitting her foot on December 16th, causing bruising and swelling, which persists but is not tender.  Her current medications include baby aspirin , atorvastatin , Premarin vaginal cream, insulin  (30-35 units daily), lisinopril , metformin , and nitroglycerin  as needed.      Review of Systems  Constitutional:  Negative for chills and fever.  Respiratory:  Negative for shortness of breath.   Cardiovascular:  Negative for chest pain.  Gastrointestinal:  Negative for abdominal pain and vomiting.  Genitourinary:  Negative for dysuria, frequency, hematuria and urgency.  Neurological:  Negative for headaches.  Psychiatric/Behavioral:  Negative for hallucinations and suicidal ideas.       Objective:     BP 132/80   Pulse 72   Temp 98.1 F (36.7 C) (Oral)   Ht 5' 4 (1.626 m)   Wt 152 lb 12.8 oz (69.3 kg)   LMP 08/08/2009   SpO2 96%   BMI 26.23 kg/m    Physical Exam Vitals and nursing note reviewed.  Constitutional:      Appearance: Normal appearance.  Cardiovascular:     Rate and Rhythm: Normal rate and regular rhythm.     Pulses:  Dorsalis pedis pulses are 2+ on the right side and 2+ on the left side.       Posterior tibial pulses are 2+ on the right side and 2+ on the left side.     Heart sounds: Murmur heard.  Pulmonary:     Effort: Pulmonary effort is normal.     Breath sounds: Normal breath sounds.  Musculoskeletal:        General: Tenderness present.       Legs:       Feet:  Feet:      Comments: Streaking rash to the dorsal and plantar toes. No warmth or pain, the rash does blanch Skin:    Findings: Erythema present.  Neurological:     Mental Status: She is alert.      Results for orders placed or performed in visit on 11/07/24  CBC with Differential/Platelet  Result Value Ref Range   WBC 4.7 4.0 - 10.5 K/uL   RBC 4.39 3.87 - 5.11 Mil/uL   Hemoglobin 13.7 12.0 - 15.0 g/dL   HCT 59.9 63.9 - 53.9 %   MCV 91.0 78.0 - 100.0 fl   MCHC 34.2 30.0 - 36.0 g/dL   RDW 85.6 88.4 - 84.4 %   Platelets 146.0 (L) 150.0 - 400.0 K/uL   Neutrophils Relative % 60.4 43.0 - 77.0 %   Lymphocytes Relative 27.2 12.0 - 46.0 %   Monocytes Relative 9.1 3.0 - 12.0 %   Eosinophils Relative 2.5 0.0 - 5.0 %   Basophils Relative 0.8 0.0 - 3.0 %   Neutro Abs 2.8 1.4 - 7.7 K/uL   Lymphs Abs 1.3 0.7 - 4.0 K/uL   Monocytes Absolute 0.4 0.1 - 1.0 K/uL   Eosinophils Absolute 0.1 0.0 - 0.7 K/uL   Basophils Absolute 0.0 0.0 - 0.1 K/uL  POCT Urinalysis Dipstick (Automated)  Result Value Ref Range   Color, UA yellow    Clarity, UA clear    Glucose, UA Negative Negative   Bilirubin, UA neg    Ketones, UA neg    Spec Grav, UA 1.025 1.010 - 1.025   Blood, UA neg    pH, UA 6.0 5.0 - 8.0   Protein, UA Negative Negative   Urobilinogen, UA 0.2 0.2 or 1.0 E.U./dL   Nitrite, UA neg    Leukocytes, UA Negative Negative      The ASCVD Risk score (Arnett DK, et al., 2019) failed to calculate for the following reasons:   The valid total cholesterol range is 130 to 320 mg/dL    Assessment & Plan:   Problem List Items Addressed This Visit   None Visit Diagnoses       Cystitis    -  Primary   Relevant Medications   nitrofurantoin, macrocrystal-monohydrate, (MACROBID) 100 MG capsule   Other Relevant Orders   POCT Urinalysis Dipstick (Automated) (Completed)     Hospital discharge follow-up         Rash         Thrombocytopenia       Relevant Orders   CBC with Differential/Platelet (Completed)      Left foot pain       Relevant Orders   DG Foot 2 Views Left      Assessment and Plan Assessment & Plan Urinary tract infection Recent UTI treated with Rocephin  and Keflex . Symptoms improved, urinalysis normal. Low cross-reactivity risk with Keflex  due to penicillin and Cipro  allergy. Urine culture shows sensitivity to other antibiotics. - Discontinued Keflex . -  Prescribed Macrobid (nitrofurantoin) for 5 days. - Rechecked blood work to monitor platelet counts.  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 Good recovery, no significant murmur. Infection risk considered post-procedure. - Continue follow-up with cardiologist as scheduled.  Contusion of left foot Contusion with persistent bruising and swelling. Low fracture likelihood, but imaging needed due to prolonged swelling.  Drug-induced skin eruption Possible drug-induced skin eruption with blanching rash. Low likelihood of Keflex  reaction, precautionary switch to Macrobid advised. - Switched antibiotic to Macrobid (nitrofurantoin) as a precaution. - Check CBC 6 months recently showed low platelet count.   Return if symptoms worsen or fail to improve, for As scheduled .    Adina Crandall, NP  "

## 2024-11-07 NOTE — Telephone Encounter (Signed)
 Noted will evaluate in office

## 2024-11-07 NOTE — Patient Instructions (Signed)
 Nice to see you today  I have changed your antibiotics. STOP taking the keflex  (cephalexin ), start Macrobid I will be in touch with the xray when I have the results Follow up with Dr. Cleatus as scheduled, sooner if you need us 

## 2024-11-12 ENCOUNTER — Ambulatory Visit: Payer: Self-pay | Admitting: Nurse Practitioner

## 2024-11-13 NOTE — Telephone Encounter (Signed)
-----   Message from [facility] T sent at 11/13/2024 12:45 PM EST -----  ----- Message ----- From: Wendee Lynwood HERO, NP Sent: 11/12/2024   7:23 AM EST To: Wendee Gunnels  Notified via My Chart

## 2024-11-13 NOTE — Telephone Encounter (Signed)
 Can we verify what she is saying. In my last note she was going to stop the keflex  and do the macrobid . Has she done this?

## 2024-11-16 ENCOUNTER — Ambulatory Visit: Admitting: Gastroenterology

## 2024-11-16 ENCOUNTER — Telehealth: Payer: Self-pay

## 2024-11-16 ENCOUNTER — Ambulatory Visit: Admitting: Family Medicine

## 2024-11-16 ENCOUNTER — Encounter: Payer: Self-pay | Admitting: Gastroenterology

## 2024-11-16 VITALS — BP 140/68 | HR 76 | Ht 64.0 in | Wt 151.0 lb

## 2024-11-16 DIAGNOSIS — R151 Fecal smearing: Secondary | ICD-10-CM

## 2024-11-16 DIAGNOSIS — R159 Full incontinence of feces: Secondary | ICD-10-CM | POA: Diagnosis not present

## 2024-11-16 DIAGNOSIS — Z952 Presence of prosthetic heart valve: Secondary | ICD-10-CM

## 2024-11-16 DIAGNOSIS — Z860100 Personal history of colon polyps, unspecified: Secondary | ICD-10-CM

## 2024-11-16 DIAGNOSIS — Z860101 Personal history of adenomatous and serrated colon polyps: Secondary | ICD-10-CM

## 2024-11-16 MED ORDER — NA SULFATE-K SULFATE-MG SULF 17.5-3.13-1.6 GM/177ML PO SOLN: 1.0000 | Freq: Once | ORAL | 0 refills | Status: AC

## 2024-11-16 NOTE — Progress Notes (Signed)
 "  Gloria [name] [id number] Aug 08, 1946   Chief Complaint: Discuss colonoscopy  Referring Provider: Cleatus Arlyss RAMAN, MD Primary GI MD: Sampson (previously seen by Dr. Teressa)  HPI: Gloria [name] is a 79 y.o. female with past medical history of asthma, CAD, diabetes, hemorrhoids, kidney stones, HTN, colon polyps, s/p TAVR 10/24/2024 for severe aortic stenosis, sleep apnea, hysterectomy, pelvic organ prolapse who presents today to discuss colonoscopy.    Patient is referred by PCP for change in stool.  Was seen 08/21/2024 and endorsed change in bowel habits for about 3 months with significant constipation.  Had been managing this with MiraLAX  and mag citrate which caused loose stools.  Possibility of pelvic floor dysfunction was discussed with the patient.  Stopping metformin  did not help.  Previously seen by Dr. Teressa in 2011, more recently seen at [facility] 2020 for colonoscopy at which time was found to have a tubular adenoma and hyperplastic polyp and recommended recall in 3 to 5 years.  Patient is followed by cardiology for CAD s/p PCI 2021, and severe aortic stenosis s/p TAVR on 10/24/2024.  Seen for follow-up 10/29/2024 doing well at that time.  Plan is to continue aspirin  81 mg daily and she was cleared to resume all activities without restriction.  Planning for repeat echo and office visit in a month.   Discussed the use of AI scribe software for clinical note transcription with the patient, who gave verbal consent to proceed.  History of Present Illness Gloria [name] is a 79 year old female with colonic polyps who presents for evaluation of bowel habit changes.  Altered Bowel Habits: - Bowel movements became infrequent in November, occurring every three to four days - Associated with bloating and increased abdominal noises after meals - No abdominal pain or pain with defecation - Magnesium  citrate (liquid and tablet formulations) improved symptoms - Morning hydration with a  glass of water  recommended by her daughter - Since cardiac surgery on December 17, bowel movements have been regular and daily - Resolution of bloating and normalization of stool consistency  Fecal Incontinence: - Longstanding continuous minor stool leakage, yellowish in color - Leakage primarily occurs after bowel movements but also intermittently throughout the day - Wears a pad at all times - No associated burning, itching, discomfort, pain, or blood in the stool or on toilet paper  External Hemorrhoids: - Previously described as painful to repair - Currently without discomfort, burning, or itching  Colonic Polyps Surveillance: - History of colonic polyps with prior colonoscopies  - Due for surveillance colonoscopy  Cardiac Surgery and Systemic Symptoms: - Underwent cardiac surgery on December 17 - Not on blood thinners except aspirin  - Improved shortness of breath and fatigue since surgery   Previous GI Procedures/Imaging   Colonoscopy 10/19/2019 Impression:  - Hemorrhoids found on perianal exam. - One 2 mm polyp in the ascending colon, removed with a  jumbo cold forceps. Resected and retrieved. - One 4 mm polyp at the recto-sigmoid colon, removed with  a cold snare. Resected and retrieved. - Diverticulosis in the sigmoid colon, in the descending  colon, in the transverse colon and in the ascending colon. - The examination was otherwise normal on direct and  retroflexion views. - Repeat colonoscopy in 3 - 5 years for surveillance based  on pathology results.  Past Medical History:  Diagnosis Date   Allergic rhinitis    Arthritis    Asthma    as a chld   Coronary artery disease  Diabetes mellitus without complication (HCC) 05/24/2007   Qualifier: Diagnosis of  By: Bartley MD, Lamar Mulch    Female cystocele 08/10/2012   Hemorrhoids    History of kidney stones    Hypertension    Left shoulder pain 07/20/2016   MENOPAUSE, SURGICAL 12/16/2009   Qualifier:  Diagnosis of  By: Henrietta LPN, Regina     Pure hypercholesterolemia 03/30/2011   RHINITIS 12/24/2010   Qualifier: Diagnosis of  By: Cleatus MD, Arlyss KUGEL 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 Wonda and Dr. Shyrl   Severe aortic stenosis    Sleep apnea    wears mouthpiece    Past Surgical History:  Procedure Laterality Date   ABDOMINAL HYSTERECTOMY     BASAL CELL CARCINOMA EXCISION  08/23/2017   left upper arm   CARPAL TUNNEL RELEASE     with bilateral releases   CORONARY ATHERECTOMY N/A 02/26/2020   Procedure: CORONARY ATHERECTOMY;  Surgeon: Wonda Sharper, MD;  Location: [facility] INVASIVE CV LAB;  Service: Cardiovascular;  Laterality: N/A;   CYSTECTOMY     Left breast, right lumpectomy B9   INGUINAL HERNIA REPAIR     Right (Dr. Neysa)   INTRAOPERATIVE TRANSTHORACIC ECHOCARDIOGRAM N/A 10/24/2024   Procedure: ECHOCARDIOGRAM, TRANSTHORACIC;  Surgeon: Wonda Sharper, MD;  Location: [facility] INVASIVE CV LAB;  Service: Cardiovascular;  Laterality: N/A;   LEFT HEART CATH AND CORONARY ANGIOGRAPHY N/A 02/22/2020   Procedure: LEFT HEART CATH AND CORONARY ANGIOGRAPHY;  Surgeon: Wonda Sharper, MD;  Location: [facility] INVASIVE CV LAB;  Service: Cardiovascular;  Laterality: N/A;   LEFT HEART CATH AND CORONARY ANGIOGRAPHY N/A 10/03/2024   Procedure: LEFT HEART CATH AND CORONARY ANGIOGRAPHY;  Surgeon: Wonda Sharper, MD;  Location: [facility] INVASIVE CV LAB;  Service: Cardiovascular;  Laterality: N/A;   NSVD     X 2   PARATHYROIDECTOMY Right 09/23/2022   Procedure: RIGHT INFERIOR PARATHYROIDECTOMY AND RIGHT SUPERIOR PARATHYROID  BIOPSY;  Surgeon: Eletha Boas, MD;  Location: [facility] ORS;  Service: General;  Laterality: Right;   REVERSE SHOULDER ARTHROPLASTY Right 12/18/2021   Procedure: REVERSE SHOULDER ARTHROPLASTY;  Surgeon: Sharl Selinda Dover, MD;  Location: [facility] OR[facility];  Service: Orthopedics;  Laterality: Right;   150   TVH with vaginal prolapse  08/29/09   Transvag Tape w/Varitensor, Ant & Post Colporraphy, Uterosacral Lig susp, McCall Culdoplasty   ueteroscopy  11/01   without laser secondary stone   Urological procedure  10/22 - 08/31/09   [facility]    Current Outpatient Medications  Medication Sig Dispense Refill   ACCU-CHEK AVIVA PLUS test strip CHECK BLOOD SUGAR FOUR TIMES DAILY AS NEEDED 100 each 3   ACCU-CHEK FASTCLIX LANCETS MISC Use to check blood sugar 4 times daily as needed.  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 102 each 11   Ascorbic Acid (VITAMIN C) 1000 MG tablet Take 1,000 mg by mouth in the morning.     aspirin  EC 81 MG tablet Take 1 tablet (81 mg total) by mouth daily. 90 tablet 3   atorvastatin  (LIPITOR) 20 MG tablet TAKE 1 TABLET BY MOUTH ONCE DAILY 30 tablet 3   Blood Glucose Monitoring Suppl (ACCU-CHEK AVIVA PLUS) W/DEVICE KIT Use to check blood sugar 4 times daily as needed.  Diagnosis:  E11.9  Insulin  Dependent 1 kit 0   Cholecalciferol (VITAMIN D ) 50 MCG (2000 UT) CAPS Take 2,000 Units by mouth in the morning.  conjugated estrogens (PREMARIN) vaginal cream Place 1 applicator vaginally once a week. With weekly pessary usage     insulin  glargine (LANTUS  SOLOSTAR) 100 UNIT/ML Solostar Pen Inject 30-35 Units into the skin daily.     Insulin  Pen Needle (BD PEN NEEDLE SHORT ULTRAFINE) 31G X 8 MM MISC Use as directed to inject insulin  daily 100 each 4   lisinopril  (ZESTRIL ) 20 MG tablet TAKE ONE TABLET BY MOUTH ONCE DAILY 90 tablet 0   MAGNESIUM  CITRATE PO Take 250 mg by mouth at bedtime.     metFORMIN  (GLUCOPHAGE ) 500 MG tablet TAKE ONE TABLET BY MOUTH TWICE DAILY 180 tablet 3   nitroGLYCERIN  (NITROSTAT ) 0.4 MG SL tablet Place 1 tablet (0.4 mg total) under the tongue every 5 (five) minutes as needed. 25 tablet 3   Omega-3 Fatty Acids (FISH OIL) 1000 MG CAPS Take 1,000 mg by mouth in the morning.     polyethylene glycol powder (GLYCOLAX /MIRALAX ) 17 GM/SCOOP powder Take 8.5 g by mouth  daily.     amoxicillin  (AMOXIL ) 500 MG capsule Take 1,000 mg by mouth 2 (two) times daily. (Patient not taking: Reported on 11/16/2024)     amoxicillin  (AMOXIL ) 500 MG tablet Take 4 tablets (2,000 mg total) by mouth as directed. 1 hour prior to dental work including cleanings (Patient not taking: Reported on 11/16/2024) 12 tablet 12   nitrofurantoin , macrocrystal-monohydrate, (MACROBID ) 100 MG capsule Take 1 capsule (100 mg total) by mouth 2 (two) times daily. (Patient not taking: Reported on 11/16/2024) 10 capsule 0   No current facility-administered medications for this visit.    Allergies as of 11/16/2024 - Review Complete 11/16/2024  Allergen Reaction Noted   Sulfonamide derivatives Other (See Comments) 01/04/2007   Ciprofloxin hcl [ciprofloxacin ] Rash 04/21/2012   Penicillins Rash 01/04/2007    Family History  Problem Relation Age of Onset   Hypertension Mother    Heart disease Mother        CHF   Stroke Maternal Aunt    Diabetes Paternal Aunt 35   Stroke Paternal Aunt    Heart disease Paternal Uncle        MI   Heart disease Paternal Uncle        MI   Prostate cancer Son    Breast cancer Cousin        Breast CA   Alcohol abuse Cousin    Thyroid  disease Other    Colon cancer Neg Hx     Social History[1]   Review of Systems:    Constitutional: No weight loss, fever, chills Cardiovascular: No chest pain Respiratory: No SOB Gastrointestinal: See HPI and otherwise negative   Physical Exam:  Vital signs: BP (!) 140/68   Pulse 76   Ht 5' 4 (1.626 m)   Wt 151 lb (68.5 kg)   LMP 08/08/2009   BMI 25.92 kg/m   Wt Readings from Last 3 Encounters:  11/16/24 151 lb (68.5 kg)  11/07/24 152 lb 12.8 oz (69.3 kg)  11/04/24 149 lb 14.6 oz (68 kg)     Constitutional: Pleasant, well-appearing female in NAD, alert and cooperative Head:  Normocephalic and atraumatic.  Respiratory: Respirations even and unlabored. Lungs clear to auscultation bilaterally.  No wheezes, crackles,  or rhonchi.  Cardiovascular:  Regular rate and rhythm. No murmurs. No peripheral edema. Gastrointestinal:  Soft, nondistended, nontender. No rebound or guarding. Normal bowel sounds. No appreciable masses or hepatomegaly. Rectal:  Not performed.  Neurologic:  Alert and oriented x4;  grossly normal neurologically.  Skin:  Dry and intact without significant lesions or rashes. Psychiatric: Oriented to person, place and time. Demonstrates good judgement and reason without abnormal affect or behaviors.   Assessment/Plan:   Assessment & Plan  History of colon polyps History of colonic polyps. Surveillance colonoscopy needed.  Patient states she has been advised by cardiology team that she will need to wait 3 months post TAVR before she can undergo procedure.  - Schedule surveillance colonoscopy after January 23, 2025, per cardiology recommendations. I thoroughly discussed the procedure with the patient to include nature of the procedure, alternatives, benefits, and risks (including but not limited to bleeding, infection, perforation, anesthesia/cardiac/pulmonary complications). Patient verbalized understanding and gave verbal consent to proceed with procedure.  - Request cardiology clearance prior to colonoscopy.  Fecal incontinence Mild fecal incontinence managed with absorbent pads, stable without discomfort, ongoing for years.   Camie Furbish, PA-C [facility] 11/16/2024, 10:51 AM  Patient Care Team: Cleatus Arlyss RAMAN, MD as PCP - General (Family Medicine) Wonda Sharper, MD as PCP - Structural Heart (Cardiology) Wonda Sharper, MD as PCP - Cardiology (Cardiology)       [1]  Social History Tobacco Use   Smoking status: Never   Smokeless tobacco: Never  Vaping Use   Vaping status: Never Used  Substance Use Topics   Alcohol use: Yes    Alcohol/week: 2.0 - 3.0 standard drinks of alcohol    Types: 2 - 3 Glasses of wine per week    Comment: occas.   Drug use: No   "

## 2024-11-16 NOTE — Patient Instructions (Signed)
 You have been scheduled for a colonoscopy. Please follow written instructions given to you at your visit today.   If you use inhalers (even only as needed), please bring them with you on the day of your procedure.  DO NOT TAKE 7 DAYS PRIOR TO TEST- Trulicity (dulaglutide) Ozempic, Wegovy (semaglutide) Mounjaro, Zepbound (tirzepatide) Bydureon Bcise (exanatide extended release)  DO NOT TAKE 1 DAY PRIOR TO YOUR TEST Rybelsus (semaglutide) Adlyxin (lixisenatide) Victoza (liraglutide) Byetta (exanatide) ___________________________________________________________________________   Thank you for trusting me with your gastrointestinal care!   Camie Furbish, PA-C  _______________________________________________________  If your blood pressure at your visit was 140/90 or greater, please contact your primary care physician to follow up on this.  _______________________________________________________  If you are age 75 or older, your body mass index should be between 23-30. Your Body mass index is 25.92 kg/m. If this is out of the aforementioned range listed, please consider follow up with your Primary Care Provider.  If you are age 68 or younger, your body mass index should be between 19-25. Your Body mass index is 25.92 kg/m. If this is out of the aformentioned range listed, please consider follow up with your Primary Care Provider.   ________________________________________________________  The [facility] providers would like to encourage you to use MYCHART to communicate with providers for non-urgent requests or questions.  Due to long hold times on the telephone, sending your provider a message by [facility] may be a faster and more efficient way to get a response.  Please allow 48 business hours for a response.  Please remember that this is for non-urgent requests.  _______________________________________________________  [facility] is using a team-based approach to care.   Your team is made up of your doctor and two to three APPS. Our APPS (Nurse Practitioners and Physician Assistants) work with your physician to ensure care continuity for you. They are fully qualified to address your health concerns and develop a treatment plan. They communicate directly with your gastroenterologist to care for you. Seeing the Advanced Practice Practitioners on your physician's team can help you by facilitating care more promptly, often allowing for earlier appointments, access to diagnostic testing, procedures, and other specialty referrals.

## 2024-11-16 NOTE — Telephone Encounter (Signed)
"  ° °  Patient Name: Gloria [name]  DOB: [birth year] MRN: [id number]  Primary Cardiologist: Ozell Fell, MD  Chart reviewed as part of pre-operative protocol coverage. Given past medical history and time since last visit, based on ACC/AHA guidelines, DENA ESPERANZA is at acceptable risk for the planned procedure without further cardiovascular testing.    I will route this recommendation to the requesting party via Epic fax function and remove from pre-op pool.  Please call with questions.  Winnie Umali, [place] 11/16/2024, 11:39 AM  "

## 2024-11-16 NOTE — Telephone Encounter (Signed)
 She will need to re evaluated. Stay hydrated. Give UC and ED precautions

## 2024-11-16 NOTE — Telephone Encounter (Signed)
-----   Message from [facility] T sent at 11/13/2024  4:29 PM EST ----- Called and spoke with patient.  Pt states that she finished macrobid  on Sunday. Stopped keflex  as instructed. Pt complains of still having UTI symptoms and took AZO at home. Pt is scheduled with PCP 11/16/24.  Please advise  ----- Message ----- From: Wendee Lynwood HERO, NP Sent: 11/13/2024   1:39 PM EST To: Wendee Gunnels  ----- Message from Lynwood HERO Wendee, NP sent at 11/13/2024  1:39 PM EST -----   ----- Message ----- From: Sebastian Shu, CMA Sent: 11/13/2024  12:45 PM EST To: Lynwood HERO Wendee, NP   ----- Message ----- From: Wendee Lynwood HERO, NP Sent: 11/12/2024   7:23 AM EST To: Wendee Gunnels  Notified via My Chart

## 2024-11-16 NOTE — Telephone Encounter (Signed)
  [facility] Pre-operative Risk Assessment     Request for surgical clearance:     Endoscopy Procedure  What type of surgery is being performed?     Colonoscopy  When is this surgery scheduled?     01/16/25  What type of clearance is required ?   Cardiac  Are there any medications that need to be held prior to surgery and how long? N/A  Practice name and name of physician performing surgery?      [facility]  What is your office phone and fax number?      Phone- [phone]  Fax- [phone]  Anesthesia type (None, local, MAC, general) ?       MAC   Please route your response to [employer], CMA

## 2024-11-19 ENCOUNTER — Encounter: Attending: Cardiovascular Disease

## 2024-11-19 VITALS — Ht 63.8 in | Wt 151.4 lb

## 2024-11-19 DIAGNOSIS — Z48812 Encounter for surgical aftercare following surgery on the circulatory system: Secondary | ICD-10-CM | POA: Insufficient documentation

## 2024-11-19 DIAGNOSIS — Z713 Dietary counseling and surveillance: Secondary | ICD-10-CM | POA: Insufficient documentation

## 2024-11-19 DIAGNOSIS — Z952 Presence of prosthetic heart valve: Secondary | ICD-10-CM | POA: Insufficient documentation

## 2024-11-19 NOTE — Patient Instructions (Signed)
 Patient Instructions  Patient Details  Name: Gloria [name] MRN: [id number] Date of Birth: [birth year] Referring Provider:  Wonda Sharper, MD  Below are your personal goals for exercise, nutrition, and risk factors. Our goal is to help you stay on track towards obtaining and maintaining these goals. We will be discussing your progress on these goals with you throughout the program.  Initial Exercise Prescription:  Initial Exercise Prescription - 11/19/24 1500       Date of Initial Exercise RX and Referring Provider   Date 11/19/24    Referring Provider Wonda Sharper, MD      Oxygen   Maintain Oxygen Saturation 88% or higher      Treadmill   MPH 1.7    Grade 0.5    Minutes 15    METs 2.42      Bike   Level 1    Watts 15    Minutes 15    METs 2.27      Recumbant Bike   Level 2    RPM 50    Watts 25    Minutes 15    METs 2.27      NuStep   Level 2    SPM 80    Minutes 15    METs 2.27      REL-XR   Level 1    Speed 50    Minutes 15    METs 2.27      T5 Nustep   Level 2    SPM 80    Minutes 15    METs 2.27      Biostep-RELP   Level 2    SPM 50    Minutes 15    METs 2.27      Prescription Details   Frequency (times per week) 3    Duration Progress to 30 minutes of continuous aerobic without signs/symptoms of physical distress      Intensity   THRR 40-80% of Max Heartrate 98-126    Ratings of Perceived Exertion 11-13    Perceived Dyspnea 0-4      Progression   Progression Continue to progress workloads to maintain intensity without signs/symptoms of physical distress.      Resistance Training   Training Prescription Yes    Weight 3 lb,4 lb   L:3 lb, R:4 lb   Reps 10-15          Exercise Goals: Frequency: Be able to perform aerobic exercise two to three times per week in program working toward 2-5 days per week of home exercise.  Intensity: Work with a perceived exertion of 11 (fairly light) - 15 (hard) while following your exercise  prescription.  We will make changes to your prescription with you as you progress through the program.   Duration: Be able to do 30 to 45 minutes of continuous aerobic exercise in addition to a 5 minute warm-up and a 5 minute cool-down routine.   Nutrition Goals: Your personal nutrition goals will be established when you do your nutrition analysis with the dietician.  The following are general nutrition guidelines to follow: Cholesterol < 200mg /day Sodium < 1500mg /day Fiber: Women over 50 yrs - 21 grams per day  Personal Goals:  Personal Goals and Risk Factors at Admission - 11/19/24 0949       Core Components/Risk Factors/Patient Goals on Admission    Weight Management Yes;Weight Loss    Intervention Weight Management: Provide education and appropriate resources to help participant work on and attain dietary  goals.;Weight Management: Develop a combined nutrition and exercise program designed to reach desired caloric intake, while maintaining appropriate intake of nutrient and fiber, sodium and fats, and appropriate energy expenditure required for the weight goal.;Weight Management/Obesity: Establish reasonable short term and long term weight goals.    Admit Weight 151 lb 6.4 oz (68.7 kg)    Goal Weight: Short Term 145 lb (65.8 kg)    Goal Weight: Long Term 145 lb (65.8 kg)    Expected Outcomes Short Term: Continue to assess and modify interventions until short term weight is achieved;Long Term: Adherence to nutrition and physical activity/exercise program aimed toward attainment of established weight goal;Weight Loss: Understanding of general recommendations for a balanced deficit meal plan, which promotes 1-2 lb weight loss per week and includes a negative energy balance of [phone] kcal/d;Understanding recommendations for meals to include 15-35% energy as protein, 25-35% energy from fat, 35-60% energy from carbohydrates, less than 200mg  of dietary cholesterol, 20-35 gm of total fiber  daily;Understanding of distribution of calorie intake throughout the day with the consumption of 4-5 meals/snacks    Diabetes Yes    Intervention Provide education about signs/symptoms and action to take for hypo/hyperglycemia.;Provide education about proper nutrition, including hydration, and aerobic/resistive exercise prescription along with prescribed medications to achieve blood glucose in normal ranges: Fasting glucose 65-99 mg/dL    Expected Outcomes Short Term: Participant verbalizes understanding of the signs/symptoms and immediate care of hyper/hypoglycemia, proper foot care and importance of medication, aerobic/resistive exercise and nutrition plan for blood glucose control.;Long Term: Attainment of HbA1C < 7%.    Hypertension Yes    Intervention Monitor prescription use compliance.;Provide education on lifestyle modifcations including regular physical activity/exercise, weight management, moderate sodium restriction and increased consumption of fresh fruit, vegetables, and low fat dairy, alcohol moderation, and smoking cessation.    Expected Outcomes Short Term: Continued assessment and intervention until BP is < 140/67mm HG in hypertensive participants. < 130/6mm HG in hypertensive participants with diabetes, heart failure or chronic kidney disease.;Long Term: Maintenance of blood pressure at goal levels.    Lipids Yes    Intervention Provide education and support for participant on nutrition & aerobic/resistive exercise along with prescribed medications to achieve LDL 70mg , HDL >40mg .    Expected Outcomes Short Term: Participant states understanding of desired cholesterol values and is compliant with medications prescribed. Participant is following exercise prescription and nutrition guidelines.;Long Term: Cholesterol controlled with medications as prescribed, with individualized exercise RX and with personalized nutrition plan. Value goals: LDL < 70mg , HDL > 40 mg.          Tobacco Use  Initial Evaluation: Social History   Tobacco Use  Smoking Status Never  Smokeless Tobacco Never    Exercise Goals and Review:  Exercise Goals     Row Name 11/19/24 1512             Exercise Goals   Increase Physical Activity Yes       Intervention Provide advice, education, support and counseling about physical activity/exercise needs.;Develop an individualized exercise prescription for aerobic and resistive training based on initial evaluation findings, risk stratification, comorbidities and participant's personal goals.       Expected Outcomes Short Term: Attend rehab on a regular basis to increase amount of physical activity.;Long Term: Add in home exercise to make exercise part of routine and to increase amount of physical activity.;Long Term: Exercising regularly at least 3-5 days a week.       Increase Strength and Stamina Yes  Intervention Provide advice, education, support and counseling about physical activity/exercise needs.;Develop an individualized exercise prescription for aerobic and resistive training based on initial evaluation findings, risk stratification, comorbidities and participant's personal goals.       Expected Outcomes Short Term: Increase workloads from initial exercise prescription for resistance, speed, and METs.;Long Term: Improve cardiorespiratory fitness, muscular endurance and strength as measured by increased METs and functional capacity ( );Short Term: Perform resistance training exercises routinely during rehab and add in resistance training at home       Able to understand and use rate of perceived exertion (RPE) scale Yes       Intervention Provide education and explanation on how to use RPE scale       Expected Outcomes Short Term: Able to use RPE daily in rehab to express subjective intensity level;Long Term:  Able to use RPE to guide intensity level when exercising independently       Able to understand and use Dyspnea scale Yes        Intervention Provide education and explanation on how to use Dyspnea scale       Expected Outcomes Short Term: Able to use Dyspnea scale daily in rehab to express subjective sense of shortness of breath during exertion;Long Term: Able to use Dyspnea scale to guide intensity level when exercising independently       Knowledge and understanding of Target Heart Rate Range (THRR) Yes       Intervention Provide education and explanation of THRR including how the numbers were predicted and where they are located for reference       Expected Outcomes Short Term: Able to state/look up THRR;Short Term: Able to use daily as guideline for intensity in rehab;Long Term: Able to use THRR to govern intensity when exercising independently       Able to check pulse independently Yes       Intervention Provide education and demonstration on how to check pulse in carotid and radial arteries.;Review the importance of being able to check your own pulse for safety during independent exercise       Expected Outcomes Short Term: Able to explain why pulse checking is important during independent exercise;Long Term: Able to check pulse independently and accurately       Understanding of Exercise Prescription Yes       Intervention Provide education, explanation, and written materials on patient's individual exercise prescription       Expected Outcomes Short Term: Able to explain program exercise prescription;Long Term: Able to explain home exercise prescription to exercise independently

## 2024-11-19 NOTE — Progress Notes (Signed)
 Cardiac Individual Treatment Plan  Patient Details  Name: Gloria [name] MRN: [id number] Date of Birth: 22-Dec-1945 Referring Provider:   Flowsheet Row Cardiac Rehab from 11/19/2024 in [facility] Cardiac and Pulmonary Rehab  Referring Provider Wonda Sharper, MD    Initial Encounter Date:  Flowsheet Row Cardiac Rehab from 11/19/2024 in [facility] Cardiac and Pulmonary Rehab  Date 11/19/24    Visit Diagnosis: S/P TAVR (transcatheter aortic valve replacement)  Patient's Home Medications on Admission: Current Medications[1]  Past Medical History: Past Medical History:  Diagnosis Date   Allergic rhinitis    Arthritis    Asthma    as a chld   Coronary artery disease    Diabetes mellitus without complication (HCC) 05/24/2007   Qualifier: Diagnosis of  By: Bartley MD, Lamar Mulch    Female cystocele 08/10/2012   Hemorrhoids    History of kidney stones    Hypertension    Left shoulder pain 07/20/2016   MENOPAUSE, SURGICAL 12/16/2009   Qualifier: Diagnosis of  By: Henrietta LPN, Regina     Pure hypercholesterolemia 03/30/2011   RHINITIS 12/24/2010   Qualifier: Diagnosis of  By: Cleatus MD, Arlyss KUGEL 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 Wonda and Dr. Shyrl   Severe aortic stenosis    Sleep apnea    wears mouthpiece    Tobacco Use: Tobacco Use History[2]  Labs: Review Flowsheet  More data exists      Latest Ref Rng & Units 05/20/2023 09/22/2023 02/13/2024 08/21/2024 10/24/2024  Labs for ITP Cardiac and Pulmonary Rehab  Cholestrol 0 - 200 mg/dL - - 883  - -  LDL (calc) 0 - 99 mg/dL - - 67  - -  HDL-C >60.99 mg/dL - - 65.69  - -  Trlycerides 0.0 - 149.0 mg/dL - - 24.9  - -  Hemoglobin A1c 4.0 - 5.6 % 8.1  7.5  8.1  8.4  -  TCO2 22 - 32 mmol/L - - - - 25      Exercise Target Goals: Exercise Program Goal: Individual exercise prescription set using results from initial 6 min  walk test and THRR while considering  patients activity barriers and safety.   Exercise Prescription Goal: Initial exercise prescription builds to 30-45 minutes a day of aerobic activity, 2-3 days per week.  Home exercise guidelines will be given to patient during program as part of exercise prescription that the participant will acknowledge.   Education: Aerobic Exercise: - Group verbal and visual presentation on the components of exercise prescription. Introduces F.I.T.T principle from ACSM for exercise prescriptions.  Reviews F.I.T.T. principles of aerobic exercise including progression. Written material provided at class time. Flowsheet Row Cardiac Rehab from 11/19/2024 in [facility] Cardiac and Pulmonary Rehab  Education need identified 11/19/24    Education: Resistance Exercise: - Group verbal and visual presentation on the components of exercise prescription. Introduces F.I.T.T principle from ACSM for exercise prescriptions  Reviews F.I.T.T. principles of resistance exercise including progression. Written material provided at class time.    Education: Exercise & Equipment Safety: - Individual verbal instruction and demonstration of equipment use and safety with use of the equipment. Flowsheet Row Cardiac Rehab from 11/19/2024 in [facility] Cardiac and Pulmonary Rehab  Date 11/19/24  Educator MB  Instruction Review Code 1- Verbalizes Understanding    Education: Exercise Physiology & General Exercise Guidelines: - Group verbal and written instruction with  models to review the exercise physiology of the cardiovascular system and associated critical values. Provides general exercise guidelines with specific guidelines to those with heart or lung disease. Written material provided at class time. Flowsheet Row Cardiac Rehab from 06/04/2020 in [facility] Cardiac and Pulmonary Rehab  Date 05/21/20  Educator AS  Instruction Review Code 1- Verbalizes Understanding    Education: Flexibility, Balance,  Mind/Body Relaxation: - Group verbal and visual presentation with interactive activity on the components of exercise prescription. Introduces F.I.T.T principle from ACSM for exercise prescriptions. Reviews F.I.T.T. principles of flexibility and balance exercise training including progression. Also discusses the mind body connection.  Reviews various relaxation techniques to help reduce and manage stress (i.e. Deep breathing, progressive muscle relaxation, and visualization). Balance handout provided to take home. Written material provided at class time.   Activity Barriers & Risk Stratification:  Activity Barriers & Cardiac Risk Stratification - 11/19/24 1032       Activity Barriers & Cardiac Risk Stratification   Activity Barriers Arthritis;Joint Problems;Deconditioning;Shortness of Breath;Chest Pain/Angina;Balance Concerns    Cardiac Risk Stratification Moderate          6 Minute Walk:  6 Minute Walk     Row Name 11/19/24 1506         6 Minute Walk   Phase Initial     Distance 1100 feet     Walk Time 6 minutes     # of Rest Breaks 0     MPH 2.08     METS 2.27     RPE 13     Perceived Dyspnea  1     VO2 Peak 7.95     Symptoms No     Resting HR 70 bpm     Resting BP 148/70     Resting Oxygen Saturation  97 %     Exercise Oxygen Saturation  during 6 min walk 96 %     Max Ex. HR 99 bpm     Max Ex. BP 160/70     2 Minute Post BP 132/80        Oxygen Initial Assessment:   Oxygen Re-Evaluation:   Oxygen Discharge (Final Oxygen Re-Evaluation):   Initial Exercise Prescription:  Initial Exercise Prescription - 11/19/24 1500       Date of Initial Exercise RX and Referring Provider   Date 11/19/24    Referring Provider Wonda Sharper, MD      Oxygen   Maintain Oxygen Saturation 88% or higher      Treadmill   MPH 1.7    Grade 0.5    Minutes 15    METs 2.42      Bike   Level 1    Watts 15    Minutes 15    METs 2.27      Recumbant Bike   Level 2     RPM 50    Watts 25    Minutes 15    METs 2.27      NuStep   Level 2    SPM 80    Minutes 15    METs 2.27      REL-XR   Level 1    Speed 50    Minutes 15    METs 2.27      T5 Nustep   Level 2    SPM 80    Minutes 15    METs 2.27      Biostep-RELP   Level 2    SPM 50  Minutes 15    METs 2.27      Prescription Details   Frequency (times per week) 3    Duration Progress to 30 minutes of continuous aerobic without signs/symptoms of physical distress      Intensity   THRR 40-80% of Max Heartrate 98-126    Ratings of Perceived Exertion 11-13    Perceived Dyspnea 0-4      Progression   Progression Continue to progress workloads to maintain intensity without signs/symptoms of physical distress.      Resistance Training   Training Prescription Yes    Weight 3 lb,4 lb   L:3 lb, R:4 lb   Reps 10-15          Perform Capillary Blood Glucose checks as needed.  Exercise Prescription Changes:   Exercise Prescription Changes     Row Name 11/19/24 1500             Response to Exercise   Blood Pressure (Admit) 148/70       Blood Pressure (Exercise) 160/70       Blood Pressure (Exit) 132/80       Heart Rate (Admit) 70 bpm       Heart Rate (Exercise) 99 bpm       Heart Rate (Exit) 82 bpm       Oxygen Saturation (Admit) 97 %       Oxygen Saturation (Exercise) 96 %       Oxygen Saturation (Exit) 97 %       Rating of Perceived Exertion (Exercise) 13       Perceived Dyspnea (Exercise) 1       Symptoms none       Comments results         Progression   Average METs 2.27          Exercise Comments:   Exercise Goals and Review:   Exercise Goals     Row Name 11/19/24 1512             Exercise Goals   Increase Physical Activity Yes       Intervention Provide advice, education, support and counseling about physical activity/exercise needs.;Develop an individualized exercise prescription for aerobic and resistive training based on initial  evaluation findings, risk stratification, comorbidities and participant's personal goals.       Expected Outcomes Short Term: Attend rehab on a regular basis to increase amount of physical activity.;Long Term: Add in home exercise to make exercise part of routine and to increase amount of physical activity.;Long Term: Exercising regularly at least 3-5 days a week.       Increase Strength and Stamina Yes       Intervention Provide advice, education, support and counseling about physical activity/exercise needs.;Develop an individualized exercise prescription for aerobic and resistive training based on initial evaluation findings, risk stratification, comorbidities and participant's personal goals.       Expected Outcomes Short Term: Increase workloads from initial exercise prescription for resistance, speed, and METs.;Long Term: Improve cardiorespiratory fitness, muscular endurance and strength as measured by increased METs and functional capacity ( );Short Term: Perform resistance training exercises routinely during rehab and add in resistance training at home       Able to understand and use rate of perceived exertion (RPE) scale Yes       Intervention Provide education and explanation on how to use RPE scale       Expected Outcomes Short Term: Able to use RPE daily in rehab to  express subjective intensity level;Long Term:  Able to use RPE to guide intensity level when exercising independently       Able to understand and use Dyspnea scale Yes       Intervention Provide education and explanation on how to use Dyspnea scale       Expected Outcomes Short Term: Able to use Dyspnea scale daily in rehab to express subjective sense of shortness of breath during exertion;Long Term: Able to use Dyspnea scale to guide intensity level when exercising independently       Knowledge and understanding of Target Heart Rate Range (THRR) Yes       Intervention Provide education and explanation of THRR including how  the numbers were predicted and where they are located for reference       Expected Outcomes Short Term: Able to state/look up THRR;Short Term: Able to use daily as guideline for intensity in rehab;Long Term: Able to use THRR to govern intensity when exercising independently       Able to check pulse independently Yes       Intervention Provide education and demonstration on how to check pulse in carotid and radial arteries.;Review the importance of being able to check your own pulse for safety during independent exercise       Expected Outcomes Short Term: Able to explain why pulse checking is important during independent exercise;Long Term: Able to check pulse independently and accurately       Understanding of Exercise Prescription Yes       Intervention Provide education, explanation, and written materials on patient's individual exercise prescription       Expected Outcomes Short Term: Able to explain program exercise prescription;Long Term: Able to explain home exercise prescription to exercise independently          Exercise Goals Re-Evaluation :   Discharge Exercise Prescription (Final Exercise Prescription Changes):  Exercise Prescription Changes - 11/19/24 1500       Response to Exercise   Blood Pressure (Admit) 148/70    Blood Pressure (Exercise) 160/70    Blood Pressure (Exit) 132/80    Heart Rate (Admit) 70 bpm    Heart Rate (Exercise) 99 bpm    Heart Rate (Exit) 82 bpm    Oxygen Saturation (Admit) 97 %    Oxygen Saturation (Exercise) 96 %    Oxygen Saturation (Exit) 97 %    Rating of Perceived Exertion (Exercise) 13    Perceived Dyspnea (Exercise) 1    Symptoms none    Comments results      Progression   Average METs 2.27          Nutrition:  Target Goals: Understanding of nutrition guidelines, daily intake of sodium 1500mg , cholesterol 200mg , calories 30% from fat and 7% or less from saturated fats, daily to have 5 or more servings of fruits and  vegetables.  Education: Nutrition 1 -Group instruction provided by verbal, written material, interactive activities, discussions, models, and posters to present general guidelines for heart healthy nutrition including macronutrients, label reading, and promoting whole foods over processed counterparts. Education serves as pensions consultant of discussion of heart healthy eating for all. Written material provided at class time.    Education: Nutrition 2 -Group instruction provided by verbal, written material, interactive activities, discussions, models, and posters to present general guidelines for heart healthy nutrition including sodium, cholesterol, and saturated fat. Providing guidance of habit forming to improve blood pressure, cholesterol, and body weight. Written material provided at class time.  Biometrics:  Pre Biometrics - 11/19/24 1512       Pre Biometrics   Height 5' 3.8 (1.621 m)    Weight 151 lb 6.4 oz (68.7 kg)    Waist Circumference 38.3 inches    Hip Circumference 40 inches    Waist to Hip Ratio 0.96 %    BMI (Calculated) 26.14    Single Leg Stand 27.3 seconds           Nutrition Therapy Plan and Nutrition Goals:  Nutrition Therapy & Goals - 11/19/24 1339       Nutrition Therapy   Diet Cardiac, Low Na, Carb controlled    Protein (specify units) 60-70    Fiber 25 grams    Whole Grain Foods 3 servings    Saturated Fats 15 max. grams    Fruits and Vegetables 5 servings/day    Sodium 2 grams      Personal Nutrition Goals   Nutrition Goal Eat 15-30gProtein and 30-60gCarbs at each meal.    Personal Goal #2 Read labels and reduce sodium intake to below 2300mg . Ideally 1500mg  per day.    Personal Goal #3 Reduce saturated fat, less than 12g per day. Replace bad fats for more heart healthy fats.    Comments Alexy is drink ~48-64oz of water . She eats 3 meals per day most days, sometimes a snack or two as well. She eats the same breakfast most mornings but likes  variety in her lunch and dinner. Provided mediterranean diet handout, educated on types of fats, sources, and how to read labels. Discussed reducing sodium and saturated fat. Reviewed her food recall and brainstormed addition meals and snacks with foods she likes and will eat.      Intervention Plan   Intervention Prescribe, educate and counsel regarding individualized specific dietary modifications aiming towards targeted core components such as weight, hypertension, lipid management, diabetes, heart failure and other comorbidities.;Nutrition handout(s) given to patient.    Expected Outcomes Short Term Goal: Understand basic principles of dietary content, such as calories, fat, sodium, cholesterol and nutrients.;Short Term Goal: A plan has been developed with personal nutrition goals set during dietitian appointment.;Long Term Goal: Adherence to prescribed nutrition plan.          Nutrition Assessments:  MEDIFICTS Score Key: >=70 Need to make dietary changes  40-70 Heart Healthy Diet <= 40 Therapeutic Level Cholesterol Diet  Flowsheet Row Cardiac Rehab from 11/19/2024 in [facility] Cardiac and Pulmonary Rehab  Picture Your Plate Total Score on Admission 75   Picture Your Plate Scores: <59 Unhealthy dietary pattern with much room for improvement. 41-50 Dietary pattern unlikely to meet recommendations for good health and room for improvement. 51-60 More healthful dietary pattern, with some room for improvement.  >60 Healthy dietary pattern, although there may be some specific behaviors that could be improved.    Nutrition Goals Re-Evaluation:   Nutrition Goals Discharge (Final Nutrition Goals Re-Evaluation):   Psychosocial: Target Goals: Acknowledge presence or absence of significant depression and/or stress, maximize coping skills, provide positive support system. Participant is able to verbalize types and ability to use techniques and skills needed for reducing stress and depression.    Education: Stress, Anxiety, and Depression - Group verbal and visual presentation to define topics covered.  Reviews how body is impacted by stress, anxiety, and depression.  Also discusses healthy ways to reduce stress and to treat/manage anxiety and depression. Written material provided at class time.   Education: Sleep Hygiene -Provides group verbal and written instruction about  how sleep can affect your health.  Define sleep hygiene, discuss sleep cycles and impact of sleep habits. Review good sleep hygiene tips.   Initial Review & Psychosocial Screening:  Initial Psych Review & Screening - 11/19/24 1113       Initial Review   Current issues with None Identified      Family Dynamics   Good Support System? Yes      Barriers   Psychosocial barriers to participate in program There are no identifiable barriers or psychosocial needs.      Screening Interventions   Interventions Provide feedback about the scores to participant;Encouraged to exercise;To provide support and resources with identified psychosocial needs    Expected Outcomes Short Term goal: Utilizing psychosocial counselor, staff and physician to assist with identification of specific Stressors or current issues interfering with healing process. Setting desired goal for each stressor or current issue identified.;Long Term Goal: Stressors or current issues are controlled or eliminated.;Short Term goal: Identification and review with participant of any Quality of Life or Depression concerns found by scoring the questionnaire.;Long Term goal: The participant improves quality of Life and PHQ9 Scores as seen by post scores and/or verbalization of changes          Quality of Life Scores:   Quality of Life - 11/19/24 1050       Quality of Life   Select Quality of Life      Quality of Life Scores   Health/Function Pre 23.5 %    Socioeconomic Pre 22.38 %    Psych/Spiritual Pre 23.14 %    Family Pre 27.6 %    GLOBAL Pre  23.76 %         Scores of 19 and below usually indicate a poorer quality of life in these areas.  A difference of  2-3 points is a clinically meaningful difference.  A difference of 2-3 points in the total score of the Quality of Life Index has been associated with significant improvement in overall quality of life, self-image, physical symptoms, and general health in studies assessing change in quality of life.  PHQ-9: Review Flowsheet  More data exists      11/19/2024 11/07/2024 10/17/2024 08/21/2024 02/20/2024  Depression screen PHQ 2/9  Decreased Interest 0 0 0 0 0  Down, Depressed, Hopeless 0 0 0 0 0  PHQ - 2 Score 0 0 0 0 0  Altered sleeping 1 1 0 0 0  Tired, decreased energy 1 1 0 1 0  Change in appetite 0 0 0 0 0  Feeling bad or failure about yourself  0 0 0 0 0  Trouble concentrating 1 0 0 0 0  Moving slowly or fidgety/restless 0 0 0 0 0  Suicidal thoughts 0 0 0 0 0  PHQ-9 Score 3 2 0 1  0   Difficult doing work/chores Not difficult at all Not difficult at all Not difficult at all Not difficult at all Not difficult at all    Details       Data saved with a previous flowsheet row definition        Interpretation of Total Score  Total Score Depression Severity:  1-4 = Minimal depression, 5-9 = Mild depression, 10-14 = Moderate depression, 15-19 = Moderately severe depression, 20-27 = Severe depression   Psychosocial Evaluation and Intervention:  Psychosocial Evaluation - 11/19/24 1113       Psychosocial Evaluation & Interventions   Interventions Encouraged to exercise with the program and follow exercise prescription  Comments Ms. Rothgeb is coming to cardiac rehab post TAVR. She states she can notice an improvement in her shortness of breath after having her valve replaced, but the incline on her property still is difficult to get up when she is trying to walk. 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 She is thankful she had the surgery and is ready to move forward. She states she does not have any stress concerns at this time and she focuses on being steady even when things get busier in life by remaining focused on her goals and spending time with family. She enjoys playing computer and phone games and reading a good book.    Expected Outcomes Short: attend cardiac rehab for education and exercise Long: develop and maintain positive self care habits    Continue Psychosocial Services  Follow up required by staff          Psychosocial Re-Evaluation:   Psychosocial Discharge (Final Psychosocial Re-Evaluation):   Vocational Rehabilitation: Provide vocational rehab assistance to qualifying candidates.   Vocational Rehab Evaluation & Intervention:  Vocational Rehab - 11/19/24 0951       Initial Vocational Rehab Evaluation & Intervention   Assessment shows need for Vocational Rehabilitation No          Education: Education Goals: Education classes will be provided on a variety of topics geared toward better understanding of heart health and risk factor modification. Participant will state understanding/return demonstration of topics presented as noted by education test scores.  Learning Barriers/Preferences:  Learning Barriers/Preferences - 11/19/24 0951       Learning Barriers/Preferences   Learning Barriers None    Learning Preferences None          General Cardiac Education Topics:  AED/CPR: - Group verbal and written instruction with the use of models to demonstrate the basic use of the AED with the basic ABC's of resuscitation.   Test and Procedures: - Group verbal and visual presentation and models provide information about basic cardiac anatomy and function. Reviews the testing methods done to diagnose heart disease  and the outcomes of the test results. Describes the treatment choices: Medical Management, Angioplasty, or Coronary Bypass Surgery for treating various heart conditions including Myocardial Infarction, Angina, Valve Disease, and Cardiac Arrhythmias. Written material provided at class time. Flowsheet Row Cardiac Rehab from 11/19/2024 in [facility] Cardiac and Pulmonary Rehab  Education need identified 11/19/24    Medication Safety: - Group verbal and visual instruction to review commonly prescribed medications for heart and lung disease. Reviews the medication, class of the drug, and side effects. Includes the steps to properly store meds and maintain the prescription regimen. Written material provided at class time. Flowsheet Row Cardiac Rehab from 06/04/2020 in [facility] Cardiac and Pulmonary Rehab  Date 06/04/20  Educator SB  Instruction Review Code 1- Verbalizes Understanding    Intimacy: - Group verbal instruction through game format to discuss how heart and lung disease can affect sexual intimacy. Written material provided at class time.   Know Your Numbers and Heart Failure: - Group verbal and visual instruction to discuss disease risk factors for cardiac and pulmonary disease and treatment options.  Reviews associated critical values for Overweight/Obesity, Hypertension, Cholesterol, and Diabetes.  Discusses basics of heart failure: signs/symptoms and  treatments.  Introduces Heart Failure Zone chart for action plan for heart failure. Written material provided at class time.   Infection Prevention: - Provides verbal and written material to individual with discussion of infection control including proper hand washing and proper equipment cleaning during exercise session. Flowsheet Row Cardiac Rehab from 11/19/2024 in [facility] Cardiac and Pulmonary Rehab  Date 11/19/24  Educator [facility]  Instruction Review Code 1- Verbalizes Understanding    Falls Prevention: - Provides verbal and written material to  individual with discussion of falls prevention and safety. Flowsheet Row Cardiac Rehab from 11/19/2024 in [facility] Cardiac and Pulmonary Rehab  Date 11/19/24  Educator [facility]  Instruction Review Code 1- Verbalizes Understanding    Other: -Provides group and verbal instruction on various topics (see comments)   Knowledge Questionnaire Score:  Knowledge Questionnaire Score - 11/19/24 1050       Knowledge Questionnaire Score   Pre Score 22/26          Core Components/Risk Factors/Patient Goals at Admission:  Personal Goals and Risk Factors at Admission - 11/19/24 0949       Core Components/Risk Factors/Patient Goals on Admission    Weight Management Yes;Weight Loss    Intervention Weight Management: Provide education and appropriate resources to help participant work on and attain dietary goals.;Weight Management: Develop a combined nutrition and exercise program designed to reach desired caloric intake, while maintaining appropriate intake of nutrient and fiber, sodium and fats, and appropriate energy expenditure required for the weight goal.;Weight Management/Obesity: Establish reasonable short term and long term weight goals.    Admit Weight 151 lb 6.4 oz (68.7 kg)    Goal Weight: Short Term 145 lb (65.8 kg)    Goal Weight: Long Term 145 lb (65.8 kg)    Expected Outcomes Short Term: Continue to assess and modify interventions until short term weight is achieved;Long Term: Adherence to nutrition and physical activity/exercise program aimed toward attainment of established weight goal;Weight Loss: Understanding of general recommendations for a balanced deficit meal plan, which promotes 1-2 lb weight loss per week and includes a negative energy balance of [phone] kcal/d;Understanding recommendations for meals to include 15-35% energy as protein, 25-35% energy from fat, 35-60% energy from carbohydrates, less than 200mg  of dietary cholesterol, 20-35 gm of total fiber daily;Understanding of  distribution of calorie intake throughout the day with the consumption of 4-5 meals/snacks    Diabetes Yes    Intervention Provide education about signs/symptoms and action to take for hypo/hyperglycemia.;Provide education about proper nutrition, including hydration, and aerobic/resistive exercise prescription along with prescribed medications to achieve blood glucose in normal ranges: Fasting glucose 65-99 mg/dL    Expected Outcomes Short Term: Participant verbalizes understanding of the signs/symptoms and immediate care of hyper/hypoglycemia, proper foot care and importance of medication, aerobic/resistive exercise and nutrition plan for blood glucose control.;Long Term: Attainment of HbA1C < 7%.    Hypertension Yes    Intervention Monitor prescription use compliance.;Provide education on lifestyle modifcations including regular physical activity/exercise, weight management, moderate sodium restriction and increased consumption of fresh fruit, vegetables, and low fat dairy, alcohol moderation, and smoking cessation.    Expected Outcomes Short Term: Continued assessment and intervention until BP is < 140/29mm HG in hypertensive participants. < 130/52mm HG in hypertensive participants with diabetes, heart failure or chronic kidney disease.;Long Term: Maintenance of blood pressure at goal levels.    Lipids Yes    Intervention Provide education and support for participant on nutrition & aerobic/resistive exercise along with prescribed medications to achieve  LDL 70mg , HDL >40mg .    Expected Outcomes Short Term: Participant states understanding of desired cholesterol values and is compliant with medications prescribed. Participant is following exercise prescription and nutrition guidelines.;Long Term: Cholesterol controlled with medications as prescribed, with individualized exercise RX and with personalized nutrition plan. Value goals: LDL < 70mg , HDL > 40 mg.          Education:Diabetes - Individual  verbal and written instruction to review signs/symptoms of diabetes, desired ranges of glucose level fasting, after meals and with exercise. Acknowledge that pre and post exercise glucose checks will be done for 3 sessions at entry of program. Flowsheet Row Cardiac Rehab from 11/19/2024 in [facility]  Date 11/19/24  Educator [facility]  Instruction Review Code 1- Verbalizes Understanding    Core Components/Risk Factors/Patient Goals Review:    Core Components/Risk Factors/Patient Goals at Discharge (Final Review):    ITP Comments:  ITP Comments     Row Name 11/19/24 1117           ITP Comments Completed program orientation and . Initial ITP created and sent for review to Medical Director.          Comments: Initial ITP     [1]  Current Outpatient Medications:    ACCU-CHEK AVIVA PLUS test strip, CHECK BLOOD SUGAR FOUR TIMES DAILY AS NEEDED, Disp: 100 each, Rfl: 3   ACCU-CHEK FASTCLIX LANCETS MISC, Use to check blood sugar 4 times daily as needed.  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 (Patient not taking: Reported on 11/16/2024), Disp: , Rfl:    amoxicillin  (AMOXIL ) 500 MG tablet, Take 4 tablets (2,000 mg total) by mouth as directed. 1 hour prior to dental work including cleanings (Patient not taking: Reported on 11/16/2024), Disp: 12 tablet, Rfl: 12   Ascorbic Acid (VITAMIN C) 1000 MG tablet, Take 1,000 mg by mouth in the morning., Disp: , Rfl:    aspirin  EC 81 MG tablet, Take 1 tablet (81 mg total) by mouth daily., Disp: 90 tablet, Rfl: 3   atorvastatin  (LIPITOR) 20 MG tablet, TAKE 1 TABLET BY MOUTH ONCE DAILY, Disp: 30 tablet, Rfl: 3   Blood Glucose Monitoring Suppl (ACCU-CHEK AVIVA PLUS) W/DEVICE KIT, Use to check blood sugar 4 times daily as needed.  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 With weekly pessary usage, Disp: , Rfl:    insulin  glargine (LANTUS  SOLOSTAR) 100 UNIT/ML Solostar Pen, Inject 30-35 Units into the skin daily., Disp: , Rfl:    Insulin  Pen Needle (BD PEN NEEDLE SHORT ULTRAFINE) 31G X 8 MM MISC, Use as directed to inject insulin  daily, Disp: 100 each, Rfl: 4   lisinopril  (ZESTRIL ) 20 MG tablet, TAKE ONE TABLET BY MOUTH ONCE DAILY, Disp: 90 tablet, Rfl: 0   MAGNESIUM  CITRATE PO, Take 250 mg by mouth at bedtime., Disp: , Rfl:    metFORMIN  (GLUCOPHAGE ) 500 MG tablet, TAKE ONE TABLET BY MOUTH TWICE DAILY, Disp: 180 tablet, Rfl: 3   nitrofurantoin , macrocrystal-monohydrate, (MACROBID ) 100 MG capsule, Take 1 capsule (100 mg total) by mouth 2 (two) times daily. (Patient not taking: Reported on 11/16/2024), Disp: 10 capsule, Rfl: 0  nitroGLYCERIN  (NITROSTAT ) 0.4 MG SL tablet, Place 1 tablet (0.4 mg total) under the tongue every 5 (five) minutes as needed., Disp: 25 tablet, Rfl: 3   Omega-3 Fatty Acids (FISH OIL) 1000 MG CAPS, Take 1,000 mg by mouth in the morning., Disp: , Rfl:    polyethylene glycol powder (GLYCOLAX /MIRALAX ) 17 GM/SCOOP powder, Take 8.5 g by mouth daily., Disp: , Rfl:  [2]  Social History Tobacco Use  Smoking Status Never  Smokeless Tobacco Never

## 2024-11-19 NOTE — Progress Notes (Signed)
 Assessment start time: 10:09 AM  Digestive issues/concerns: lactose intolerance  24-hours Recall: B: 2 cups of coffee, (smoothie - berries, banana, avocado, yogurt).  egg, slice of toast L: tuna wheat thins, apple peanut butter, celery with pimiento cheese D: chicken OR deer OR fish OR shrimp with veggies, few potatoes  Snack: peanut butter crackers sometimes  Beverages water  (48-64oz)  Education r/t nutrition plan Gloria [name] is drink ~48-64oz of water . She eats 3 meals per day most days, sometimes a snack or two as well. She eats the same breakfast most mornings but likes variety in her lunch and dinner. Provided mediterranean diet handout, educated on types of fats, sources, and how to read labels. Discussed reducing sodium and saturated fat. Reviewed her food recall and brainstormed addition meals and snacks with foods she likes and will eat.   Goal 1: Eat 15-30gProtein and 30-60gCarbs at each meal. Goal 2: Read labels and reduce sodium intake to below 2300mg . Ideally 1500mg  per day.  Goal 3: Reduce saturated fat, less than 12g per day. Replace bad fats for more heart healthy fats.   End time 10:35 AM

## 2024-11-19 NOTE — Telephone Encounter (Signed)
 Copied from CRM #[id number]. Topic: Clinical - Lab/Test Results >> Nov 19, 2024  1:09 PM [name] wrote: Reason for CRM: Patient returning Gloria [name] call, Patient says she is feeling okay at this point from other previous  call on 1/6 and she has a appt this week with Dr Cleatus

## 2024-11-20 NOTE — Telephone Encounter (Signed)
 I spoke to Gloria [name] and I advised her that her cardiologist cleared her for the procedure on 01/16/25.

## 2024-11-21 DIAGNOSIS — Z952 Presence of prosthetic heart valve: Secondary | ICD-10-CM

## 2024-11-21 NOTE — Progress Notes (Signed)
 Cardiac Individual Treatment Plan  Patient Details  Name: Gloria [name] MRN: [id number] Date of Birth: [birth year] Referring Provider:   Flowsheet Row Cardiac Rehab from 11/19/2024 in [facility] Cardiac and Pulmonary Rehab  Referring Provider Wonda Sharper, MD    Initial Encounter Date:  Flowsheet Row Cardiac Rehab from 11/19/2024 in [facility] Cardiac and Pulmonary Rehab  Date 11/19/24    Visit Diagnosis: S/P TAVR (transcatheter aortic valve replacement)  Patient's Home Medications on Admission: Current Medications[1]  Past Medical History: Past Medical History:  Diagnosis Date   Allergic rhinitis    Arthritis    Asthma    as a chld   Coronary artery disease    Diabetes mellitus without complication (HCC) 05/24/2007   Qualifier: Diagnosis of  By: Bartley MD, Lamar Mulch    Female cystocele 08/10/2012   Hemorrhoids    History of kidney stones    Hypertension    Left shoulder pain 07/20/2016   MENOPAUSE, SURGICAL 12/16/2009   Qualifier: Diagnosis of  By: Henrietta LPN, Regina     Pure hypercholesterolemia 03/30/2011   RHINITIS 12/24/2010   Qualifier: Diagnosis of  By: Cleatus MD, Arlyss KUGEL 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 Wonda and Dr. Shyrl   Severe aortic stenosis    Sleep apnea    wears mouthpiece    Tobacco Use: Tobacco Use History[2]  Labs: Review Flowsheet  More data exists      Latest Ref Rng & Units 05/20/2023 09/22/2023 02/13/2024 08/21/2024 10/24/2024  Labs for ITP Cardiac and Pulmonary Rehab  Cholestrol 0 - 200 mg/dL - - 883  - -  LDL (calc) 0 - 99 mg/dL - - 67  - -  HDL-C >60.99 mg/dL - - 65.69  - -  Trlycerides 0.0 - 149.0 mg/dL - - 24.9  - -  Hemoglobin A1c 4.0 - 5.6 % 8.1  7.5  8.1  8.4  -  TCO2 22 - 32 mmol/L - - - - 25      Exercise Target Goals: Exercise Program Goal: Individual exercise prescription set using results from initial 6 min  walk test and THRR while considering  patients activity barriers and safety.   Exercise Prescription Goal: Initial exercise prescription builds to 30-45 minutes a day of aerobic activity, 2-3 days per week.  Home exercise guidelines will be given to patient during program as part of exercise prescription that the participant will acknowledge.   Education: Aerobic Exercise: - Group verbal and visual presentation on the components of exercise prescription. Introduces F.I.T.T principle from ACSM for exercise prescriptions.  Reviews F.I.T.T. principles of aerobic exercise including progression. Written material provided at class time. Flowsheet Row Cardiac Rehab from 11/19/2024 in [facility] Cardiac and Pulmonary Rehab  Education need identified 11/19/24    Education: Resistance Exercise: - Group verbal and visual presentation on the components of exercise prescription. Introduces F.I.T.T principle from ACSM for exercise prescriptions  Reviews F.I.T.T. principles of resistance exercise including progression. Written material provided at class time.    Education: Exercise & Equipment Safety: - Individual verbal instruction and demonstration of equipment use and safety with use of the equipment. Flowsheet Row Cardiac Rehab from 11/19/2024 in [facility] Cardiac and Pulmonary Rehab  Date 11/19/24  Educator MB  Instruction Review Code 1- Verbalizes Understanding    Education: Exercise Physiology & General Exercise Guidelines: - Group verbal and written instruction with  models to review the exercise physiology of the cardiovascular system and associated critical values. Provides general exercise guidelines with specific guidelines to those with heart or lung disease. Written material provided at class time. Flowsheet Row Cardiac Rehab from 06/04/2020 in [facility] Cardiac and Pulmonary Rehab  Date 05/21/20  Educator AS  Instruction Review Code 1- Verbalizes Understanding    Education: Flexibility, Balance,  Mind/Body Relaxation: - Group verbal and visual presentation with interactive activity on the components of exercise prescription. Introduces F.I.T.T principle from ACSM for exercise prescriptions. Reviews F.I.T.T. principles of flexibility and balance exercise training including progression. Also discusses the mind body connection.  Reviews various relaxation techniques to help reduce and manage stress (i.e. Deep breathing, progressive muscle relaxation, and visualization). Balance handout provided to take home. Written material provided at class time.   Activity Barriers & Risk Stratification:  Activity Barriers & Cardiac Risk Stratification - 11/19/24 1032       Activity Barriers & Cardiac Risk Stratification   Activity Barriers Arthritis;Joint Problems;Deconditioning;Shortness of Breath;Chest Pain/Angina;Balance Concerns    Cardiac Risk Stratification Moderate          6 Minute Walk:  6 Minute Walk     Row Name 11/19/24 1506         6 Minute Walk   Phase Initial     Distance 1100 feet     Walk Time 6 minutes     # of Rest Breaks 0     MPH 2.08     METS 2.27     RPE 13     Perceived Dyspnea  1     VO2 Peak 7.95     Symptoms No     Resting HR 70 bpm     Resting BP 148/70     Resting Oxygen Saturation  97 %     Exercise Oxygen Saturation  during 6 min walk 96 %     Max Ex. HR 99 bpm     Max Ex. BP 160/70     2 Minute Post BP 132/80        Oxygen Initial Assessment:   Oxygen Re-Evaluation:   Oxygen Discharge (Final Oxygen Re-Evaluation):   Initial Exercise Prescription:  Initial Exercise Prescription - 11/19/24 1500       Date of Initial Exercise RX and Referring Provider   Date 11/19/24    Referring Provider Wonda Sharper, MD      Oxygen   Maintain Oxygen Saturation 88% or higher      Treadmill   MPH 1.7    Grade 0.5    Minutes 15    METs 2.42      Bike   Level 1    Watts 15    Minutes 15    METs 2.27      Recumbant Bike   Level 2     RPM 50    Watts 25    Minutes 15    METs 2.27      NuStep   Level 2    SPM 80    Minutes 15    METs 2.27      REL-XR   Level 1    Speed 50    Minutes 15    METs 2.27      T5 Nustep   Level 2    SPM 80    Minutes 15    METs 2.27      Biostep-RELP   Level 2    SPM 50  Minutes 15    METs 2.27      Prescription Details   Frequency (times per week) 3    Duration Progress to 30 minutes of continuous aerobic without signs/symptoms of physical distress      Intensity   THRR 40-80% of Max Heartrate 98-126    Ratings of Perceived Exertion 11-13    Perceived Dyspnea 0-4      Progression   Progression Continue to progress workloads to maintain intensity without signs/symptoms of physical distress.      Resistance Training   Training Prescription Yes    Weight 3 lb,4 lb   L:3 lb, R:4 lb   Reps 10-15          Perform Capillary Blood Glucose checks as needed.  Exercise Prescription Changes:   Exercise Prescription Changes     Row Name 11/19/24 1500             Response to Exercise   Blood Pressure (Admit) 148/70       Blood Pressure (Exercise) 160/70       Blood Pressure (Exit) 132/80       Heart Rate (Admit) 70 bpm       Heart Rate (Exercise) 99 bpm       Heart Rate (Exit) 82 bpm       Oxygen Saturation (Admit) 97 %       Oxygen Saturation (Exercise) 96 %       Oxygen Saturation (Exit) 97 %       Rating of Perceived Exertion (Exercise) 13       Perceived Dyspnea (Exercise) 1       Symptoms none       Comments results         Progression   Average METs 2.27          Exercise Comments:   Exercise Goals and Review:   Exercise Goals     Row Name 11/19/24 1512             Exercise Goals   Increase Physical Activity Yes       Intervention Provide advice, education, support and counseling about physical activity/exercise needs.;Develop an individualized exercise prescription for aerobic and resistive training based on initial  evaluation findings, risk stratification, comorbidities and participant's personal goals.       Expected Outcomes Short Term: Attend rehab on a regular basis to increase amount of physical activity.;Long Term: Add in home exercise to make exercise part of routine and to increase amount of physical activity.;Long Term: Exercising regularly at least 3-5 days a week.       Increase Strength and Stamina Yes       Intervention Provide advice, education, support and counseling about physical activity/exercise needs.;Develop an individualized exercise prescription for aerobic and resistive training based on initial evaluation findings, risk stratification, comorbidities and participant's personal goals.       Expected Outcomes Short Term: Increase workloads from initial exercise prescription for resistance, speed, and METs.;Long Term: Improve cardiorespiratory fitness, muscular endurance and strength as measured by increased METs and functional capacity ( );Short Term: Perform resistance training exercises routinely during rehab and add in resistance training at home       Able to understand and use rate of perceived exertion (RPE) scale Yes       Intervention Provide education and explanation on how to use RPE scale       Expected Outcomes Short Term: Able to use RPE daily in rehab to  express subjective intensity level;Long Term:  Able to use RPE to guide intensity level when exercising independently       Able to understand and use Dyspnea scale Yes       Intervention Provide education and explanation on how to use Dyspnea scale       Expected Outcomes Short Term: Able to use Dyspnea scale daily in rehab to express subjective sense of shortness of breath during exertion;Long Term: Able to use Dyspnea scale to guide intensity level when exercising independently       Knowledge and understanding of Target Heart Rate Range (THRR) Yes       Intervention Provide education and explanation of THRR including how  the numbers were predicted and where they are located for reference       Expected Outcomes Short Term: Able to state/look up THRR;Short Term: Able to use daily as guideline for intensity in rehab;Long Term: Able to use THRR to govern intensity when exercising independently       Able to check pulse independently Yes       Intervention Provide education and demonstration on how to check pulse in carotid and radial arteries.;Review the importance of being able to check your own pulse for safety during independent exercise       Expected Outcomes Short Term: Able to explain why pulse checking is important during independent exercise;Long Term: Able to check pulse independently and accurately       Understanding of Exercise Prescription Yes       Intervention Provide education, explanation, and written materials on patient's individual exercise prescription       Expected Outcomes Short Term: Able to explain program exercise prescription;Long Term: Able to explain home exercise prescription to exercise independently          Exercise Goals Re-Evaluation :   Discharge Exercise Prescription (Final Exercise Prescription Changes):  Exercise Prescription Changes - 11/19/24 1500       Response to Exercise   Blood Pressure (Admit) 148/70    Blood Pressure (Exercise) 160/70    Blood Pressure (Exit) 132/80    Heart Rate (Admit) 70 bpm    Heart Rate (Exercise) 99 bpm    Heart Rate (Exit) 82 bpm    Oxygen Saturation (Admit) 97 %    Oxygen Saturation (Exercise) 96 %    Oxygen Saturation (Exit) 97 %    Rating of Perceived Exertion (Exercise) 13    Perceived Dyspnea (Exercise) 1    Symptoms none    Comments results      Progression   Average METs 2.27          Nutrition:  Target Goals: Understanding of nutrition guidelines, daily intake of sodium 1500mg , cholesterol 200mg , calories 30% from fat and 7% or less from saturated fats, daily to have 5 or more servings of fruits and  vegetables.  Education: Nutrition 1 -Group instruction provided by verbal, written material, interactive activities, discussions, models, and posters to present general guidelines for heart healthy nutrition including macronutrients, label reading, and promoting whole foods over processed counterparts. Education serves as pensions consultant of discussion of heart healthy eating for all. Written material provided at class time.    Education: Nutrition 2 -Group instruction provided by verbal, written material, interactive activities, discussions, models, and posters to present general guidelines for heart healthy nutrition including sodium, cholesterol, and saturated fat. Providing guidance of habit forming to improve blood pressure, cholesterol, and body weight. Written material provided at class time.  Biometrics:  Pre Biometrics - 11/19/24 1512       Pre Biometrics   Height 5' 3.8 (1.621 m)    Weight 151 lb 6.4 oz (68.7 kg)    Waist Circumference 38.3 inches    Hip Circumference 40 inches    Waist to Hip Ratio 0.96 %    BMI (Calculated) 26.14    Single Leg Stand 27.3 seconds           Nutrition Therapy Plan and Nutrition Goals:  Nutrition Therapy & Goals - 11/19/24 1339       Nutrition Therapy   Diet Cardiac, Low Na, Carb controlled    Protein (specify units) 60-70    Fiber 25 grams    Whole Grain Foods 3 servings    Saturated Fats 15 max. grams    Fruits and Vegetables 5 servings/day    Sodium 2 grams      Personal Nutrition Goals   Nutrition Goal Eat 15-30gProtein and 30-60gCarbs at each meal.    Personal Goal #2 Read labels and reduce sodium intake to below 2300mg . Ideally 1500mg  per day.    Personal Goal #3 Reduce saturated fat, less than 12g per day. Replace bad fats for more heart healthy fats.    Comments [place] is drink ~48-64oz of water . She eats 3 meals per day most days, sometimes a snack or two as well. She eats the same breakfast most mornings but likes  variety in her lunch and dinner. Provided mediterranean diet handout, educated on types of fats, sources, and how to read labels. Discussed reducing sodium and saturated fat. Reviewed her food recall and brainstormed addition meals and snacks with foods she likes and will eat.      Intervention Plan   Intervention Prescribe, educate and counsel regarding individualized specific dietary modifications aiming towards targeted core components such as weight, hypertension, lipid management, diabetes, heart failure and other comorbidities.;Nutrition handout(s) given to patient.    Expected Outcomes Short Term Goal: Understand basic principles of dietary content, such as calories, fat, sodium, cholesterol and nutrients.;Short Term Goal: A plan has been developed with personal nutrition goals set during dietitian appointment.;Long Term Goal: Adherence to prescribed nutrition plan.          Nutrition Assessments:  MEDIFICTS Score Key: >=70 Need to make dietary changes  40-70 Heart Healthy Diet <= 40 Therapeutic Level Cholesterol Diet  Flowsheet Row Cardiac Rehab from 11/19/2024 in [facility] Cardiac and Pulmonary Rehab  Picture Your Plate Total Score on Admission 75   Picture Your Plate Scores: <59 Unhealthy dietary pattern with much room for improvement. 41-50 Dietary pattern unlikely to meet recommendations for good health and room for improvement. 51-60 More healthful dietary pattern, with some room for improvement.  >60 Healthy dietary pattern, although there may be some specific behaviors that could be improved.    Nutrition Goals Re-Evaluation:   Nutrition Goals Discharge (Final Nutrition Goals Re-Evaluation):   Psychosocial: Target Goals: Acknowledge presence or absence of significant depression and/or stress, maximize coping skills, provide positive support system. Participant is able to verbalize types and ability to use techniques and skills needed for reducing stress and depression.    Education: Stress, Anxiety, and Depression - Group verbal and visual presentation to define topics covered.  Reviews how body is impacted by stress, anxiety, and depression.  Also discusses healthy ways to reduce stress and to treat/manage anxiety and depression. Written material provided at class time.   Education: Sleep Hygiene -Provides group verbal and written instruction about  how sleep can affect your health.  Define sleep hygiene, discuss sleep cycles and impact of sleep habits. Review good sleep hygiene tips.   Initial Review & Psychosocial Screening:  Initial Psych Review & Screening - 11/19/24 1113       Initial Review   Current issues with None Identified      Family Dynamics   Good Support System? Yes      Barriers   Psychosocial barriers to participate in program There are no identifiable barriers or psychosocial needs.      Screening Interventions   Interventions Provide feedback about the scores to participant;Encouraged to exercise;To provide support and resources with identified psychosocial needs    Expected Outcomes Short Term goal: Utilizing psychosocial counselor, staff and physician to assist with identification of specific Stressors or current issues interfering with healing process. Setting desired goal for each stressor or current issue identified.;Long Term Goal: Stressors or current issues are controlled or eliminated.;Short Term goal: Identification and review with participant of any Quality of Life or Depression concerns found by scoring the questionnaire.;Long Term goal: The participant improves quality of Life and PHQ9 Scores as seen by post scores and/or verbalization of changes          Quality of Life Scores:   Quality of Life - 11/19/24 1050       Quality of Life   Select Quality of Life      Quality of Life Scores   Health/Function Pre 23.5 %    Socioeconomic Pre 22.38 %    Psych/Spiritual Pre 23.14 %    Family Pre 27.6 %    GLOBAL Pre  23.76 %         Scores of 19 and below usually indicate a poorer quality of life in these areas.  A difference of  2-3 points is a clinically meaningful difference.  A difference of 2-3 points in the total score of the Quality of Life Index has been associated with significant improvement in overall quality of life, self-image, physical symptoms, and general health in studies assessing change in quality of life.  PHQ-9: Review Flowsheet  More data exists      11/19/2024 11/07/2024 10/17/2024 08/21/2024 02/20/2024  Depression screen PHQ 2/9  Decreased Interest 0 0 0 0 0  Down, Depressed, Hopeless 0 0 0 0 0  PHQ - 2 Score 0 0 0 0 0  Altered sleeping 1 1 0 0 0  Tired, decreased energy 1 1 0 1 0  Change in appetite 0 0 0 0 0  Feeling bad or failure about yourself  0 0 0 0 0  Trouble concentrating 1 0 0 0 0  Moving slowly or fidgety/restless 0 0 0 0 0  Suicidal thoughts 0 0 0 0 0  PHQ-9 Score 3 2 0 1  0   Difficult doing work/chores Not difficult at all Not difficult at all Not difficult at all Not difficult at all Not difficult at all    Details       Data saved with a previous flowsheet row definition        Interpretation of Total Score  Total Score Depression Severity:  1-4 = Minimal depression, 5-9 = Mild depression, 10-14 = Moderate depression, 15-19 = Moderately severe depression, 20-27 = Severe depression   Psychosocial Evaluation and Intervention:  Psychosocial Evaluation - 11/19/24 1113       Psychosocial Evaluation & Interventions   Interventions Encouraged to exercise with the program and follow exercise prescription  Comments Ms. Sleeper is coming to cardiac rehab post TAVR. She states she can notice an improvement in her shortness of breath after having her valve replaced, but the incline on her property still is difficult to get up when she is trying to walk. 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 She is thankful she had the surgery and is ready to move forward. She states she does not have any stress concerns at this time and she focuses on being steady even when things get busier in life by remaining focused on her goals and spending time with family. She enjoys playing computer and phone games and reading a good book.    Expected Outcomes Short: attend cardiac rehab for education and exercise Long: develop and maintain positive self care habits    Continue Psychosocial Services  Follow up required by staff          Psychosocial Re-Evaluation:   Psychosocial Discharge (Final Psychosocial Re-Evaluation):   Vocational Rehabilitation: Provide vocational rehab assistance to qualifying candidates.   Vocational Rehab Evaluation & Intervention:  Vocational Rehab - 11/19/24 0951       Initial Vocational Rehab Evaluation & Intervention   Assessment shows need for Vocational Rehabilitation No          Education: Education Goals: Education classes will be provided on a variety of topics geared toward better understanding of heart health and risk factor modification. Participant will state understanding/return demonstration of topics presented as noted by education test scores.  Learning Barriers/Preferences:  Learning Barriers/Preferences - 11/19/24 0951       Learning Barriers/Preferences   Learning Barriers None    Learning Preferences None          General Cardiac Education Topics:  AED/CPR: - Group verbal and written instruction with the use of models to demonstrate the basic use of the AED with the basic ABC's of resuscitation.   Test and Procedures: - Group verbal and visual presentation and models provide information about basic cardiac anatomy and function. Reviews the testing methods done to diagnose heart disease  and the outcomes of the test results. Describes the treatment choices: Medical Management, Angioplasty, or Coronary Bypass Surgery for treating various heart conditions including Myocardial Infarction, Angina, Valve Disease, and Cardiac Arrhythmias. Written material provided at class time. Flowsheet Row Cardiac Rehab from 11/19/2024 in [facility] Cardiac and Pulmonary Rehab  Education need identified 11/19/24    Medication Safety: - Group verbal and visual instruction to review commonly prescribed medications for heart and lung disease. Reviews the medication, class of the drug, and side effects. Includes the steps to properly store meds and maintain the prescription regimen. Written material provided at class time. Flowsheet Row Cardiac Rehab from 06/04/2020 in [facility] Cardiac and Pulmonary Rehab  Date 06/04/20  Educator SB  Instruction Review Code 1- Verbalizes Understanding    Intimacy: - Group verbal instruction through game format to discuss how heart and lung disease can affect sexual intimacy. Written material provided at class time.   Know Your Numbers and Heart Failure: - Group verbal and visual instruction to discuss disease risk factors for cardiac and pulmonary disease and treatment options.  Reviews associated critical values for Overweight/Obesity, Hypertension, Cholesterol, and Diabetes.  Discusses basics of heart failure: signs/symptoms and  treatments.  Introduces Heart Failure Zone chart for action plan for heart failure. Written material provided at class time.   Infection Prevention: - Provides verbal and written material to individual with discussion of infection control including proper hand washing and proper equipment cleaning during exercise session. Flowsheet Row Cardiac Rehab from 11/19/2024 in [facility] Cardiac and Pulmonary Rehab  Date 11/19/24  Educator [facility]  Instruction Review Code 1- Verbalizes Understanding    Falls Prevention: - Provides verbal and written material to  individual with discussion of falls prevention and safety. Flowsheet Row Cardiac Rehab from 11/19/2024 in [facility] Cardiac and Pulmonary Rehab  Date 11/19/24  Educator [facility]  Instruction Review Code 1- Verbalizes Understanding    Other: -Provides group and verbal instruction on various topics (see comments)   Knowledge Questionnaire Score:  Knowledge Questionnaire Score - 11/19/24 1050       Knowledge Questionnaire Score   Pre Score 22/26          Core Components/Risk Factors/Patient Goals at Admission:  Personal Goals and Risk Factors at Admission - 11/19/24 0949       Core Components/Risk Factors/Patient Goals on Admission    Weight Management Yes;Weight Loss    Intervention Weight Management: Provide education and appropriate resources to help participant work on and attain dietary goals.;Weight Management: Develop a combined nutrition and exercise program designed to reach desired caloric intake, while maintaining appropriate intake of nutrient and fiber, sodium and fats, and appropriate energy expenditure required for the weight goal.;Weight Management/Obesity: Establish reasonable short term and long term weight goals.    Admit Weight 151 lb 6.4 oz (68.7 kg)    Goal Weight: Short Term 145 lb (65.8 kg)    Goal Weight: Long Term 145 lb (65.8 kg)    Expected Outcomes Short Term: Continue to assess and modify interventions until short term weight is achieved;Long Term: Adherence to nutrition and physical activity/exercise program aimed toward attainment of established weight goal;Weight Loss: Understanding of general recommendations for a balanced deficit meal plan, which promotes 1-2 lb weight loss per week and includes a negative energy balance of [phone] kcal/d;Understanding recommendations for meals to include 15-35% energy as protein, 25-35% energy from fat, 35-60% energy from carbohydrates, less than 200mg  of dietary cholesterol, 20-35 gm of total fiber daily;Understanding of  distribution of calorie intake throughout the day with the consumption of 4-5 meals/snacks    Diabetes Yes    Intervention Provide education about signs/symptoms and action to take for hypo/hyperglycemia.;Provide education about proper nutrition, including hydration, and aerobic/resistive exercise prescription along with prescribed medications to achieve blood glucose in normal ranges: Fasting glucose 65-99 mg/dL    Expected Outcomes Short Term: Participant verbalizes understanding of the signs/symptoms and immediate care of hyper/hypoglycemia, proper foot care and importance of medication, aerobic/resistive exercise and nutrition plan for blood glucose control.;Long Term: Attainment of HbA1C < 7%.    Hypertension Yes    Intervention Monitor prescription use compliance.;Provide education on lifestyle modifcations including regular physical activity/exercise, weight management, moderate sodium restriction and increased consumption of fresh fruit, vegetables, and low fat dairy, alcohol moderation, and smoking cessation.    Expected Outcomes Short Term: Continued assessment and intervention until BP is < 140/86mm HG in hypertensive participants. < 130/18mm HG in hypertensive participants with diabetes, heart failure or chronic kidney disease.;Long Term: Maintenance of blood pressure at goal levels.    Lipids Yes    Intervention Provide education and support for participant on nutrition & aerobic/resistive exercise along with prescribed medications to achieve  LDL 70mg , HDL >40mg .    Expected Outcomes Short Term: Participant states understanding of desired cholesterol values and is compliant with medications prescribed. Participant is following exercise prescription and nutrition guidelines.;Long Term: Cholesterol controlled with medications as prescribed, with individualized exercise RX and with personalized nutrition plan. Value goals: LDL < 70mg , HDL > 40 mg.          Education:Diabetes - Individual  verbal and written instruction to review signs/symptoms of diabetes, desired ranges of glucose level fasting, after meals and with exercise. Acknowledge that pre and post exercise glucose checks will be done for 3 sessions at entry of program. Flowsheet Row Cardiac Rehab [facility] 11/19/2024 in [facility]  Date 11/19/24  Educator [facility]  Instruction Review Code 1- Verbalizes Understanding    Core Components/Risk Factors/Patient Goals Review:    Core Components/Risk Factors/Patient Goals at Discharge (Final Review):    ITP Comments:  ITP Comments     Row Name 11/19/24 1117 11/21/24 1128         ITP Comments Completed program orientation and . Initial ITP created and sent for review to Medical Director. 30 Day review completed. Medical Director ITP review done, changes made as directed, and signed approval by Medical Director. new to program.         Comments: 30 day review     [1]  Current Outpatient Medications:    ACCU-CHEK AVIVA PLUS test strip, CHECK BLOOD SUGAR FOUR TIMES DAILY AS NEEDED, Disp: 100 each, Rfl: 3   ACCU-CHEK FASTCLIX LANCETS MISC, Use to check blood sugar 4 times daily as needed.  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 (Patient not taking: Reported on 11/16/2024), Disp: , Rfl:    amoxicillin  (AMOXIL ) 500 MG tablet, Take 4 tablets (2,000 mg total) by mouth as directed. 1 hour prior to dental work including cleanings (Patient not taking: Reported on 11/16/2024), Disp: 12 tablet, Rfl: 12   Ascorbic Acid (VITAMIN C) 1000 MG tablet, Take 1,000 mg by mouth in the morning., Disp: , Rfl:    aspirin  EC 81 MG tablet, Take 1 tablet (81 mg total) by mouth daily., Disp: 90 tablet, Rfl: 3   atorvastatin  (LIPITOR) 20 MG tablet, TAKE 1 TABLET BY MOUTH ONCE DAILY, Disp: 30 tablet, Rfl: 3   Blood Glucose Monitoring Suppl (ACCU-CHEK AVIVA PLUS) W/DEVICE KIT, Use  to check blood sugar 4 times daily as needed.  Diagnosis:  E11.9  Insulin  Dependent, Disp: 1 kit, Rfl: 0   Cholecalciferol (VITAMIN D ) 50 MCG (2000 UT) CAPS, Take 2,000 Units by mouth in the morning., Disp: , Rfl:    conjugated estrogens (PREMARIN) vaginal cream, Place 1 applicator vaginally once a week. With weekly pessary usage, Disp: , Rfl:    insulin  glargine (LANTUS  SOLOSTAR) 100 UNIT/ML Solostar Pen, Inject 30-35 Units into the skin daily., Disp: , Rfl:    Insulin  Pen Needle (BD PEN NEEDLE SHORT ULTRAFINE) 31G X 8 MM MISC, Use as directed to inject insulin  daily, Disp: 100 each, Rfl: 4   lisinopril  (ZESTRIL ) 20 MG tablet, TAKE ONE TABLET BY MOUTH ONCE DAILY, Disp: 90 tablet, Rfl: 0   MAGNESIUM  CITRATE PO, Take 250 mg by mouth at bedtime., Disp: , Rfl:    metFORMIN  (GLUCOPHAGE ) 500 MG tablet, TAKE ONE TABLET BY MOUTH TWICE DAILY, Disp: 180 tablet, Rfl: 3   nitrofurantoin , macrocrystal-monohydrate, (MACROBID ) 100 MG capsule, Take 1 capsule (  100 mg total) by mouth 2 (two) times daily. (Patient not taking: Reported on 11/16/2024), Disp: 10 capsule, Rfl: 0   nitroGLYCERIN  (NITROSTAT ) 0.4 MG SL tablet, Place 1 tablet (0.4 mg total) under the tongue every 5 (five) minutes as needed., Disp: 25 tablet, Rfl: 3   Omega-3 Fatty Acids (FISH OIL) 1000 MG CAPS, Take 1,000 mg by mouth in the morning., Disp: , Rfl:    polyethylene glycol powder (GLYCOLAX /MIRALAX ) 17 GM/SCOOP powder, Take 8.5 g by mouth daily., Disp: , Rfl:  [2]  Social History Tobacco Use  Smoking Status Never  Smokeless Tobacco Never

## 2024-11-23 ENCOUNTER — Ambulatory Visit: Admitting: Family Medicine

## 2024-11-23 ENCOUNTER — Encounter: Payer: Self-pay | Admitting: Family Medicine

## 2024-11-23 VITALS — BP 136/84 | HR 67 | Temp 98.6°F | Ht 64.0 in | Wt 151.4 lb

## 2024-11-23 DIAGNOSIS — Z952 Presence of prosthetic heart valve: Secondary | ICD-10-CM | POA: Diagnosis not present

## 2024-11-23 DIAGNOSIS — R82998 Other abnormal findings in urine: Secondary | ICD-10-CM

## 2024-11-23 DIAGNOSIS — K862 Cyst of pancreas: Secondary | ICD-10-CM | POA: Diagnosis not present

## 2024-11-23 DIAGNOSIS — E119 Type 2 diabetes mellitus without complications: Secondary | ICD-10-CM

## 2024-11-23 DIAGNOSIS — R3989 Other symptoms and signs involving the genitourinary system: Secondary | ICD-10-CM | POA: Diagnosis not present

## 2024-11-23 LAB — POCT GLYCOSYLATED HEMOGLOBIN (HGB A1C): Hemoglobin A1C: 7.7 % — AB (ref 4.0–5.6)

## 2024-11-23 LAB — POCT URINE DIPSTICK
Bilirubin, UA: NEGATIVE
Blood, UA: NEGATIVE
Glucose, UA: NEGATIVE mg/dL
Ketones, POC UA: NEGATIVE mg/dL
Nitrite, UA: NEGATIVE
POC PROTEIN,UA: NEGATIVE
Spec Grav, UA: 1.01
Urobilinogen, UA: 0.2 U/dL
pH, UA: 6

## 2024-11-23 MED ORDER — LANTUS SOLOSTAR 100 UNIT/ML ~~LOC~~ SOPN: 24.0000 [IU] | PEN_INJECTOR | Freq: Every day | SUBCUTANEOUS | Status: AC

## 2024-11-23 MED ORDER — AMOXICILLIN 500 MG PO TABS: 2000.0000 mg | ORAL_TABLET | ORAL | Status: AC

## 2024-11-23 NOTE — Patient Instructions (Signed)
 Recheck at a yearly visit in about 4 months, labs ahead of time.  Take care.  Glad to see you.

## 2024-11-23 NOTE — Progress Notes (Unsigned)
 S/p TAVR.  She has more energy but isn't back to her status of a few years ago. She is going to start cardiac rehab next week.  She has taken amoxil  mult times w/o ADE on med.  Cautions d/w pt.  No FCNAVD.  U/a d/w pt.  No burning with urination.  Ucx pending.   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 Per radiology: consideration should be given toward follow-up imaging by CT or MRI in 2 years.  Note made in EMR about that.  She is not having abdominal symptoms otherwise.  Colonoscopy pending.   Diabetes:  Using medications without difficulties: yes Hypoglycemic episodes: noted at night if she doesn't take a snack at night, down to 112 one AM.   Hyperglycemic episodes:no Feet problems: no ulceration.  Prev xray d/w pt.  Normal sensation.  Blood Sugars averaging: ~135 in the AMs, up to 180 at night.   eye exam within last year: yes A1c improved.   PMH and SH reviewed  Meds, vitals, and allergies reviewed.   ROS: Per HPI unless specifically indicated in ROS section   GEN: nad, alert and oriented HEENT: mucous membranes moist NECK: supple w/o LA CV: rrr. SEM noted.  PULM: ctab, no inc wob ABD: soft, +bs EXT: no edema SKIN: no acute rash

## 2024-11-24 LAB — URINE CULTURE
MICRO NUMBER:: 17479244
SPECIMEN QUALITY:: ADEQUATE

## 2024-11-25 ENCOUNTER — Ambulatory Visit: Payer: Self-pay | Admitting: Family Medicine

## 2024-11-25 DIAGNOSIS — K862 Cyst of pancreas: Secondary | ICD-10-CM | POA: Insufficient documentation

## 2024-11-25 NOTE — Assessment & Plan Note (Signed)
 Recheck at a yearly visit in about 4 months, labs ahead of time.  No change in meds in the meantime.  She is going start cardiac rehab and that may help with her sugar control.  Continue insulin  and metformin  as is for now.

## 2024-11-25 NOTE — Assessment & Plan Note (Signed)
 U/a d/w pt.  No burning with urination.  Ucx pending, to exclude UTI. Overall doing well.  Routine cautions given to patient.  She has more energy but isn't back to her status of a few years ago. She is going to start cardiac rehab next week.  She has taken amoxil  mult times w/o ADE on med.  Cautions d/w pt.  No FCNAVD.

## 2024-11-25 NOTE — Assessment & Plan Note (Signed)
 Per radiology: consideration should be given toward follow-up imaging by CT or MRI in 2 years.  Note made in EMR about that.  She is not having abdominal symptoms otherwise.  We can recheck this in 2027.  Discussed with patient.

## 2024-11-27 ENCOUNTER — Encounter

## 2024-11-27 DIAGNOSIS — Z952 Presence of prosthetic heart valve: Secondary | ICD-10-CM

## 2024-11-27 LAB — GLUCOSE, CAPILLARY
Glucose-Capillary: 148 mg/dL — ABNORMAL HIGH (ref 70–99)
Glucose-Capillary: 120 mg/dL — ABNORMAL HIGH (ref 70–99)

## 2024-11-27 NOTE — Progress Notes (Signed)
 Daily Session Note  Patient Details  Name: MASHAWN BRAZIL MRN: [id number] Date of Birth: 1946/10/17 Referring Provider:   Flowsheet Row Cardiac Rehab from 11/19/2024 in [facility] Cardiac and Pulmonary Rehab  Referring Provider Wonda Sharper, MD    Encounter Date: 11/27/2024  Check In:  Session Check In - 11/27/24 0926       Check-In   Supervising physician immediately available to respond to emergencies See telemetry face sheet for immediately available ER MD    Location [facility]-Cardiac & Pulmonary Rehab    Staff Present Burnard Davenport RN,BSN,MPA;Maxon Conetta BS, Exercise Physiologist;Margaret Best, MS, Exercise Physiologist;Jason Elnor RDN,LDN    Virtual Visit No    Medication changes reported     No    Fall or balance concerns reported    No    Tobacco Cessation No Change    Warm-up and Cool-down Performed on first and last piece of equipment    Resistance Training Performed Yes    VAD Patient? No    PAD/SET Patient? No      Pain Assessment   Currently in Pain? No/denies             Tobacco Use History[1]  Goals Met:  Independence with exercise equipment Exercise tolerated well No report of concerns or symptoms today Strength training completed today  Goals Unmet:  Not Applicable  Comments: First full day of exercise!  Patient was oriented to gym and equipment including functions, settings, policies, and procedures.  Patient's individual exercise prescription and treatment plan were reviewed.  All starting workloads were established based on the results of the 6 minute walk test done at initial orientation visit.  The plan for exercise progression was also introduced and progression will be customized based on patient's performance and goals.    Dr. Oneil Pinal is Medical Director for [facility] Cardiac Rehabilitation.  Dr. Fuad Aleskerov is Medical Director for [facility] Pulmonary Rehabilitation.    [1]  Social History Tobacco Use  Smoking Status Never   Smokeless Tobacco Never

## 2024-11-28 ENCOUNTER — Encounter

## 2024-11-28 ENCOUNTER — Encounter: Payer: Self-pay | Admitting: Cardiovascular Disease

## 2024-11-28 ENCOUNTER — Ambulatory Visit: Admitting: Cardiovascular Disease

## 2024-11-28 ENCOUNTER — Ambulatory Visit
Admission: RE | Admit: 2024-11-28 | Discharge: 2024-11-28 | Disposition: A | Discharge status: Home / Self Care | Source: Ambulatory Visit | Attending: Physician Assistant | Admitting: Physician Assistant

## 2024-11-28 ENCOUNTER — Ambulatory Visit: Payer: Self-pay | Admitting: Physician Assistant

## 2024-11-28 VITALS — BP 134/80 | HR 75 | Ht 64.0 in | Wt 150.0 lb

## 2024-11-28 DIAGNOSIS — I2510 Atherosclerotic heart disease of native coronary artery without angina pectoris: Secondary | ICD-10-CM | POA: Insufficient documentation

## 2024-11-28 DIAGNOSIS — Z952 Presence of prosthetic heart valve: Secondary | ICD-10-CM | POA: Insufficient documentation

## 2024-11-28 LAB — ECHOCARDIOGRAM COMPLETE
AR max vel: 2.15 cm2
AV Area VTI: 2.09 cm2
AV Area mean vel: 2.01 cm2
AV Mean grad: 17 mmHg
AV Peak grad: 28.1 mmHg
Ao pk vel: 2.65 m/s
P 1/2 time: 99 ms
S' Lateral: 3.05 cm

## 2024-11-28 NOTE — Assessment & Plan Note (Addendum)
 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  She has started cardiac rehab.  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  The formal echo interpretation is currently pending.  I would like to see her back in 1 year for her TAVR follow-up per protocol with an echocardiogram at that time.  She follows SBE prophylaxis per guidelines.  She will continue aspirin  81 mg daily.

## 2024-11-28 NOTE — Patient Instructions (Addendum)
 Medication Instructions:  No medication changes were made at this visit. Continue current regimen.   *If you need a refill on your cardiac medications before your next appointment, please call your pharmacy*  Lab Work: None ordered today. If you have labs (blood work) drawn today and your tests are completely normal, you will receive your results only by: MyChart Message (if you have MyChart) OR A paper copy in the mail If you have any lab test that is abnormal or we need to change your treatment, we will call you to review the results.  Testing/Procedures: Your physician has requested that you have an echocardiogram to be completed in 1 year prior to your follow-up with Dr. Wonda. Echocardiography is a painless test that uses sound waves to create images of your heart. It provides your doctor with information about the size and shape of your heart and how well your hearts chambers and valves are working. This procedure takes approximately one hour. There are no restrictions for this procedure. Please do NOT wear cologne, perfume, aftershave, or lotions (deodorant is allowed). Please arrive 15 minutes prior to your appointment time.  Please note: We ask at that you not bring children with you during ultrasound (echo/ vascular) testing. Due to room size and safety concerns, children are not allowed in the ultrasound rooms during exams. Our front office staff cannot provide observation of children in our lobby area while testing is being conducted. An adult accompanying a patient to their appointment will only be allowed in the ultrasound room at the discretion of the ultrasound technician under special circumstances. We apologize for any inconvenience.   Follow-Up: At [facility], you and your health needs are our priority.  As part of our continuing mission to provide you with exceptional heart care, our providers are all part of one team.  This team includes your primary Cardiologist  (physician) and Advanced Practice Providers or APPs (Physician Assistants and Nurse Practitioners) who all work together to provide you with the care you need, when you need it.  Your next appointment:   1 year(s)  Provider:   Ozell Wonda, MD

## 2024-11-28 NOTE — Progress Notes (Signed)
 " Cardiology Office Note:    Date:  11/28/2024   ID:  Gloria [name], DOB Jan 06, 1946, MRN [id number]  PCP:  Cleatus Arlyss RAMAN, MD   [facility] Providers Cardiologist:  Ozell Fell, MD Structural Heart:  Ozell Fell, MD    Referring MD: Cleatus Arlyss RAMAN, MD   Chief Complaint  Patient presents with   Follow-up    S/P TAVR    History of Present Illness:    Gloria [name] is a 79 y.o. 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   The patient developed exertional fatigue and dyspnea. Echocardiogram 08/13/2024 demonstrated EF 60%, mild MR and severe AS with mean grad 56.0 mmHg, Vmax 4.46 m/s, AVA 0.81 cm2. [facility] 10/03/24 showed patent coronary arteries with diffuse calcification and mild nonobstructive disease involving the left main, LAD, left circumflex, and RCA. Patent stent in the mid RCA with no significant restenosis. S/p TAVR with a 23 mm Edwards Sapien 3 Ultra Resilia THV via the TF approach on 10/24/24. Post operative echo showed EF 55%, mild cLVH, mod pulm HTN, mod MAC, mild-mod TR, normally functioning TAVR with a mean gradient of 12 mmHg and no PVL.   The patient is here alone today.  She continues to do well and she has appreciated significant improvement in her exertional symptoms.  Her energy level is much better with improvement in her fatigue.  She has mild dyspnea when walking up a hill but she does not have to stop and rest anymore.  No chest pain or pressure.  No lightheadedness or presyncope.  States that her groin sites have healed up fully.  No leg swelling.  Current Medications: Active Medications[1]   Allergies:   Sulfonamide derivatives, Ciprofloxin hcl [ciprofloxacin ], and Penicillins   ROS:   Please see the history of present illness.    All other systems reviewed and are negative.  EKGs/Labs/Other Studies Reviewed:    The following studies were reviewed today: Cardiac Studies & Procedures    ______________________________________________________________________________________________ CARDIAC CATHETERIZATION  CARDIAC CATHETERIZATION 10/03/2024  Conclusion 1.  Patent coronary arteries with diffuse calcification and mild nonobstructive disease involving the left main, LAD, left circumflex, and RCA.  Patent stent in the mid RCA with no significant restenosis. 2.  Known severe aortic stenosis with severely calcified aortic valve on fluoroscopy and restricted aortic valve leaflet mobility.  Recommendations: Continue evaluation for TAVR.  Findings Coronary Findings Diagnostic  Dominance: Right  Left Main There is mild diffuse disease throughout the vessel. The vessel is severely calcified. The left main is heavily calcified with mild distal tapering but no significant stenosis the vessel divides into the LAD and left circumflex, both of which are large vessels, and there is a small intermediate branch as well.  Unchanged from the previous study.  Left Anterior Descending There is mild diffuse disease throughout the vessel. The LAD is diffusely calcified.  The vessel has mild diffuse plaquing with no high-grade stenoses.  The LAD reaches the LV apex. Prox LAD to Mid LAD lesion is 40% stenosed. The lesion is severely calcified.  Left Circumflex There is mild diffuse disease throughout the vessel. The circumflex is large in caliber.  It supplies a large obtuse marginal that divides into twin branches.  The circumflex and OM branches are patent throughout with a 40% stenosis in the mid vessel. This is all stable compared to the previous study. Mid Cx lesion is 40% stenosed. The lesion is moderately calcified.  Third Obtuse  Marginal Branch 3rd Mrg lesion is 80% stenosed. The lesion is calcified. Severe branch vessel disease with heavy calcification, not amenable to PCI because of tortuosity and small caliber vessel  Right Coronary Artery There is mild diffuse disease throughout the  vessel. Non-stenotic Prox RCA to Mid RCA lesion was previously treated. The lesion is severely calcified. Severe calcific lesion located at the bifurcation of the mid RCA and acute marginal branch (large vessel)  Acute Marginal Branch Dominant vessel.  Diffusely calcified.  The vessel has mild diffuse 30 to 40% stenosis with a patent stent in the mid vessel.  There is no high-grade obstruction throughout the RCA or its branch vessels.  Intervention  No interventions have been documented.   CARDIAC CATHETERIZATION  CARDIAC CATHETERIZATION 02/26/2020  Conclusion  Prox LAD to Mid LAD lesion is 50% stenosed.  Mid Cx lesion is 40% stenosed.  3rd Mrg lesion is 80% stenosed.  Prox RCA to Mid RCA lesion is 95% stenosed.  A drug-eluting stent was successfully placed using a STENT RESOLUTE ONYX 3.5X12.  Post intervention, there is a 0% residual stenosis.  Successful atherectomy and stenting of severe calcific stenosis of the mid right coronary artery, reducing the lesion from 95% to 0% with TIMI-3 flow both pre and post procedure.  Findings Coronary Findings Diagnostic  Dominance: Right  Left Main The vessel is severely calcified. The left main is heavily calcified with mild distal tapering but no significant stenosis the vessel divides into the LAD and left circumflex, both of which are large vessels, and there is a small intermediate branch as well.  Left Anterior Descending Prox LAD to Mid LAD lesion is 50% stenosed. The lesion is severely calcified.  Left Circumflex Mid Cx lesion is 40% stenosed. The lesion is moderately calcified.  Third Obtuse Marginal Branch 3rd Mrg lesion is 80% stenosed. The lesion is calcified. Severe branch vessel disease with heavy calcification, not amenable to PCI because of tortuosity and small caliber vessel  Right Coronary Artery Prox RCA to Mid RCA lesion is 95% stenosed. The lesion is severely calcified. Severe calcific lesion located at the  bifurcation of the mid RCA and acute marginal branch (large vessel)  Intervention  Prox RCA to Mid RCA lesion Stent CATHETER LAUNCHER 6FR JR4 SH guide catheter was inserted. Lesion crossed with guidewire using a WIRE COUGAR XT STRL 190CM. Pre-stent angioplasty was performed using a BALLOON SAPPHIRE 3.0X12. Maximum pressure:  6 atm. A drug-eluting stent was successfully placed using a STENT RESOLUTE ONYX 3.5X12. Post-stent angioplasty was performed using a BALLOON SAPPHIRE Forrest 3.75X8. Maximum pressure:  16 atm. Initially, an AL 0.75 guide catheter was utilized.  This caused marked pressure dampening and we were unable to use this catheter for the procedure.  I changed out to a JR4 sidehole guide.  The patient tolerated this guide without any significant pressure change.  Heparin  was administered for anticoagulation and a therapeutic ACT is achieved.  A Viper flex wire is advanced across the lesion into the distal RCA.  Atherectomy is performed with a CSI 1.25 mm classic crown for multiple runs on low speed and high speed.  The patient tolerated this well.  Following atherectomy, the Viper wire was removed and a cougar wire is advanced into the distal RCA.  The lesion is dilated with a 3.0 mm semicompliant balloon, stented with a 3.5 x 12 mm resolute Onyx DES deployed at 12 atm, and postdilated with a 3.75 x 8 mm noncompliant balloon to 16 atm.  She  tolerated the entire procedure well.  There is 0% residual stenosis and TIMI-3 flow at the completion of the procedure. Post-Intervention Lesion Assessment The intervention was successful. Pre-interventional TIMI flow is 3. Post-intervention TIMI flow is 3. No complications occurred at this lesion. There is a 0% residual stenosis post intervention.     ECHOCARDIOGRAM  ECHOCARDIOGRAM COMPLETE 10/25/2024  Narrative ECHOCARDIOGRAM REPORT    Patient Name:   KENNAH HEHR Date of Exam: 10/25/2024 Medical Rec #:  [id number]      Height:       64.0  in Accession #:    [id number]     Weight:       150.8 lb Date of Birth:  Dec 18, 1945      BSA:          1.735 m Patient Age:    78 years       BP:           130/63 mmHg Patient Gender: F              HR:           68 bpm. Exam Location:  Inpatient  Procedure: 2D Echo (Both Spectral and Color Flow Doppler were utilized during procedure).  Indications:    post TAVR evaluation  History:        Patient has prior history of Echocardiogram examinations, most recent 10/24/2024. CAD; Risk Factors:Diabetes, Hypertension, Dyslipidemia and Sleep Apnea. Aortic Valve: 23 mm Sapien prosthetic, stented (TAVR) valve is present in the aortic position. Procedure Date: 10/24/24.  Sonographer:    Tinnie Barefoot RDCS Referring Phys: [id number] KATHRYN R THOMPSON  IMPRESSIONS   1. Left ventricular ejection fraction, by estimation, is 55 to 60%. The left ventricle has normal function. The left ventricle has no regional wall motion abnormalities. There is mild concentric left ventricular hypertrophy. Left ventricular diastolic parameters are consistent with Grade I diastolic dysfunction (impaired relaxation). Elevated left ventricular end-diastolic pressure. 2. Right ventricular systolic function is normal. The right ventricular size is normal. There is moderately elevated pulmonary artery systolic pressure. 3. Left atrial size was severely dilated. 4. The mitral valve is degenerative. Trivial mitral valve regurgitation. No evidence of mitral stenosis. Moderate mitral annular calcification. 5. Tricuspid valve regurgitation is mild to moderate. 6. The aortic valve has been repaired/replaced. Aortic valve regurgitation is trivial. No aortic stenosis is present. There is a 23 mm Sapien prosthetic (TAVR) valve present in the aortic position. Procedure Date: 10/24/24. Echo findings are consistent with normal structure and function of the aortic valve prosthesis. Aortic valve area, by VTI measures 1.70 cm. Aortic  valve mean gradient measures 12.0 mmHg. Aortic valve Vmax measures 2.41 m/s. 7. The inferior vena cava is dilated in size with >50% respiratory variability, suggesting right atrial pressure of 8 mmHg.  FINDINGS Left Ventricle: Left ventricular ejection fraction, by estimation, is 55 to 60%. The left ventricle has normal function. The left ventricle has no regional wall motion abnormalities. The left ventricular internal cavity size was normal in size. There is mild concentric left ventricular hypertrophy. Left ventricular diastolic parameters are consistent with Grade I diastolic dysfunction (impaired relaxation). Elevated left ventricular end-diastolic pressure.  Right Ventricle: The right ventricular size is normal. No increase in right ventricular wall thickness. Right ventricular systolic function is normal. There is moderately elevated pulmonary artery systolic pressure. The tricuspid regurgitant velocity is 3.08 m/s, and with an assumed right atrial pressure of 8 mmHg, the estimated right ventricular systolic pressure is 45.9 mmHg.  Left Atrium: Left atrial size was severely dilated.  Right Atrium: Right atrial size was normal in size.  Pericardium: There is no evidence of pericardial effusion.  Mitral Valve: The mitral valve is degenerative in appearance. Moderate mitral annular calcification. Trivial mitral valve regurgitation. No evidence of mitral valve stenosis. MV peak gradient, 8.2 mmHg. The mean mitral valve gradient is 3.0 mmHg.  Tricuspid Valve: The tricuspid valve is normal in structure. Tricuspid valve regurgitation is mild to moderate. No evidence of tricuspid stenosis.  Aortic Valve: The aortic valve has been repaired/replaced. Aortic valve regurgitation is trivial. No aortic stenosis is present. Aortic valve mean gradient measures 12.0 mmHg. Aortic valve peak gradient measures 23.2 mmHg. Aortic valve area, by VTI measures 1.70 cm. There is a 23 mm Sapien prosthetic, stented  (TAVR) valve present in the aortic position. Procedure Date: 10/24/24. Echo findings are consistent with normal structure and function of the aortic valve prosthesis.  Pulmonic Valve: The pulmonic valve was normal in structure. Pulmonic valve regurgitation is trivial. No evidence of pulmonic stenosis.  Aorta: The aortic root is normal in size and structure.  Venous: The inferior vena cava is dilated in size with greater than 50% respiratory variability, suggesting right atrial pressure of 8 mmHg.  IAS/Shunts: No atrial level shunt detected by color flow Doppler.   LEFT VENTRICLE PLAX 2D LVIDd:         4.70 cm     Diastology LVIDs:         2.90 cm     LV e' medial:    6.74 cm/s LV PW:         1.30 cm     LV E/e' medial:  15.7 LV IVS:        1.30 cm     LV e' lateral:   10.00 cm/s LVOT diam:     1.80 cm     LV E/e' lateral: 10.6 LV SV:         85 LV SV Index:   49 LVOT Area:     2.54 cm LV IVRT:       88 msec  LV Volumes (MOD) LV vol d, MOD A2C: 65.1 ml LV vol d, MOD A4C: 97.2 ml LV vol s, MOD A2C: 26.2 ml LV vol s, MOD A4C: 39.8 ml LV SV MOD A2C:     38.9 ml LV SV MOD A4C:     97.2 ml LV SV MOD BP:      47.7 ml  RIGHT VENTRICLE             IVC RV Basal diam:  2.70 cm     IVC diam: 2.40 cm RV S prime:     11.30 cm/s TAPSE (M-mode): 2.2 cm      PULMONARY VEINS Diastolic Velocity: 44.50 cm/s S/D Velocity:       1.20 Systolic Velocity:  54.40 cm/s  LEFT ATRIUM              Index LA diam:        4.90 cm  2.82 cm/m LA Vol (A2C):   93.1 ml  53.66 ml/m LA Vol (A4C):   99.4 ml  57.29 ml/m LA Biplane Vol: 101.0 ml 58.21 ml/m AORTIC VALVE AV Area (Vmax):    1.60 cm AV Area (Vmean):   1.69 cm AV Area (VTI):     1.70 cm AV Vmax:           241.00 cm/s AV Vmean:  156.000 cm/s AV VTI:            0.497 m AV Peak Grad:      23.2 mmHg AV Mean Grad:      12.0 mmHg LVOT Vmax:         152.00 cm/s LVOT Vmean:        103.500 cm/s LVOT VTI:          0.332 m LVOT/AV VTI  ratio: 0.67  AORTA Ao Asc diam: 3.10 cm  MITRAL VALVE                TRICUSPID VALVE MV Area (PHT): 2.87 cm     TR Peak grad:   37.9 mmHg MV Area VTI:   2.06 cm     TR Vmax:        308.00 cm/s MV Peak grad:  8.2 mmHg MV Mean grad:  3.0 mmHg     SHUNTS MV Vmax:       1.43 m/s     Systemic VTI:  0.33 m MV Vmean:      83.6 cm/s    Systemic Diam: 1.80 cm MV Decel Time: 264 msec MV E velocity: 106.00 cm/s MV A velocity: 131.00 cm/s MV E/A ratio:  0.81  Annabella Scarce MD Electronically signed by Annabella Scarce MD Signature Date/Time: 10/25/2024/10:59:55 AM    Final      CT SCANS  CT CORONARY MORPH W/CTA COR W/SCORE 09/21/2024  Addendum 09/25/2024  6:29 PM ADDENDUM REPORT: 09/25/2024 18:27  EXAM: OVER-READ INTERPRETATION  CT CHEST  The following report is an over-read performed by radiologist Dr. Andrea Gasman of [facility] on 09/25/2024. This over-read does not include interpretation of cardiac or coronary anatomy or pathology. The coronary CTA interpretation by the cardiologist is attached.  COMPARISON:  Same day full field of view chest CTA.  FINDINGS: Aortic atherosclerosis. Please reference same day full field of view chest CTA for full field of view complete thoracic assessment.  IMPRESSION: Aortic Atherosclerosis (ICD10-I70.0).   Electronically Signed By: Andrea Gasman M.D. On: 09/25/2024 18:27  Narrative CLINICAL DATA:  Severe Aortic Stenosis.  EXAM: Cardiac TAVR CT  TECHNIQUE: A non-contrast, gated CT scan was obtained with axial slices of 2.5 mm through the heart for aortic valve scoring. A 100 kV retrospective, gated, contrast cardiac scan was obtained. Gantry rotation speed was 230 msec and collimation was 0.63 mm. Nitroglycerin  was not given. The 3D dataset was reconstructed in systole with motion correction. The 3D data set was reconstructed in 5% intervals of the 0-95% of the R-R cycle. Systolic and  diastolic phases were analyzed on a dedicated workstation using MPR, MIP, and VRT modes. The patient received 100 cc of contrast.  FINDINGS: Image quality: Excellent.  Noise artifact is: Limited.  Valve Morphology: Tricuspid aortic valve with severe diffuse calcifications. Severely restricted leaflet movement in systole.  Aortic Valve Calcium  score: 2871  Aortic annular dimension:  Phase assessed: 30%  Annular area: 444 mm2  Annular perimeter: 76.2 mm  Max diameter: 26.3 mm  Min diameter: 22.3 mm  Annular and subannular calcification: Moderate, single protruding calcification under the LCC extending into LVOT.  Membranous septum length: 6.2 mm  Optimal coplanar projection: LAO 2 CAU 1  Coronary Artery Height above Annulus:  Left Main: 11.9 mm  Right Coronary: 14.9 mm  Sinus of Valsalva Measurements:  Non-coronary: 34 mm  Right-coronary: 31 mm  Left-coronary: 33 mm  Sinus of Valsalva Height:  Non-coronary: 23.1 mm  Right-coronary: 20.1  mm  Left-coronary: 20.0 mm  Sinotubular Junction: 27 mm  Ascending Thoracic Aorta: 31 mm  Coronary Arteries: Normal coronary origin. Right dominance. The study was performed without use of NTG and is insufficient for plaque evaluation. Severe coronary calcifications, unable to assess stenosis severity.  Cardiac Morphology:  Right Atrium: Right atrial size is dilated.  Right Ventricle: The right ventricular cavity is within normal limits.  Left Atrium: Left atrial size is dilated no left atrial appendage filling defect. The interatrial septum is aneurysmal.  Left Ventricle: The ventricular cavity size is within normal limits.  Pulmonary arteries: Dilated pulmonary artery suggestive of pulmonary hypertension.  Pulmonary veins: Normal pulmonary venous drainage.  Pericardium: Normal thickness with no significant effusion or calcium  present.  Mitral Valve: The mitral valve is normal structure  without significant calcification.  Extra-cardiac findings: See attached radiology report for non-cardiac structures.  IMPRESSION: 1. Annular measurements support a 26 mm S3 or 29 mm Evolut Pro.  2. Moderate, single protruding calcification under the LCC extending into LVOT.  3. Sufficient coronary to annulus distance.  4. Optimal Fluoroscopic Angle for Delivery: LAO 2 CAU 1  5. Dilated pulmonary artery suggestive of pulmonary hypertension.  6. The interatrial septum is aneurysmal.  7. Severe coronary calcifications, unable to assess stenosis severity.  Darryle T. Barbaraann, MD  Electronically Signed: By: Darryle Barbaraann M.D. On: 09/21/2024 12:09   CT SCANS  CT CORONARY MORPH W/CTA COR W/SCORE 02/15/2020  Addendum 02/15/2020  8:55 AM ADDENDUM REPORT: 02/15/2020 08:52  EXAM: OVER-READ INTERPRETATION  CT CHEST  The following report is an over-read performed by radiologist Dr. Waddell Cola [facility] on 02/15/2020. This over-read does not include interpretation of cardiac or coronary anatomy or pathology. The coronary calcium  score/coronary CTA interpretation by the cardiologist is attached.  COMPARISON:  None.  FINDINGS: Cardiovascular: Aortic atherosclerosis. Mild cardiac enlargement. No pericardial effusion.  Mediastinum/nodes: No mass or adenopathy identified.  Lungs/pleura: Scarring identified within the right upper lobe and right middle lobe.  Upper abdomen: No acute abnormality.  Musculoskeletal: Moderate multilevel thoracic degenerative disc disease with vacuum disc and ventral endplate spurring.  IMPRESSION: 1. No mass or adenopathy identified. 2. Thoracic degenerative disc disease.   Electronically Signed By: Waddell Calk M.D. On: 02/15/2020 08:52  Narrative CLINICAL DATA:  Hx of chestpain and dyspnea. Family hx of thoracic aortic aneurysm  EXAM: Cardiac/Coronary  CTA  TECHNIQUE: The patient was scanned on a Siemens  Somatoform go.Top scanner.  FINDINGS: A retrospective scan was triggered in the descending thoracic aorta. Axial non-contrast 3 mm slices were carried out through the heart. The data set was analyzed on a dedicated work station and scored using the Agatson method. Gantry rotation speed was 330 msecs and collimation was .6 mm. 100mg  of metoprolol  po, 5mg  iv and 0.8 mg of sl NTG was given. The 3D data set was reconstructed in 5% intervals of the 60-95 % of the R-R cycle. Diastolic phases were analyzed on a dedicated work station using MPR, MIP and VRT modes. The patient received 75 cc of contrast.  Aorta: Normal size. Acending and descending aortic wall calcifications. No dissection.  Aortic Valve:  Trileaflet. mild calcifications.  Coronary Arteries:  Normal coronary origin.  Right dominance.  RCA is a large dominant artery that gives rise to PDA and PLA. There is heavily calcified plaque thoughout the vessel causing severe stenosis (>70% )  Left main is a large artery that gives rise to LAD and LCX arteries.  LAD is a  large vessel that has heavily calcified plaque in the proximal, mid and distal segments causing severe stenosis (>70% )  LCX is a non-dominant artery that gives rise to 2 obtuse marginal branches. There is heavily calcified plaque in the proximal to mid segments causing severe stenosis (>70% )  Other findings:  Normal pulmonary vein drainage into the left atrium.  Normal left atrial appendage without a thrombus.  Normal size of the pulmonary artery.  IMPRESSION: 1. Coronary calcium  score of 6179. This was 53 percentile for age and sex matched control.  2. Normal ascending aorta size  3. Normal coronary origin with right dominance.  4. 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 CAD-RADS 4 Severe stenosis. (70-99% or > 50% left main). Cardiac catheterization or CT FFR is recommended. Consider  symptom-guided anti-ischemic pharmacotherapy as well as risk factor modification per guideline directed care. Additional analysis with CT FFR will be submitted and reported separately.  Electronically Signed: By: Redell Cave M.D. On: 02/14/2020 17:17     ______________________________________________________________________________________________      EKG:        Recent Labs: 02/13/2024: TSH 0.48 10/22/2024: ALT 22 10/25/2024: BUN 21; Creatinine, Ser 0.56; Magnesium  1.7; Potassium 4.0; Sodium 136 11/07/2024: Hemoglobin 13.7; Platelets 146.0  Recent Lipid Panel    Component Value Date/Time   CHOL 116 02/13/2024 0758   CHOL 109 05/21/2021 0945   TRIG 75.0 02/13/2024 0758   HDL 34.30 (L) 02/13/2024 0758   HDL 36 (L) 05/21/2021 0945   CHOLHDL 3 02/13/2024 0758   VLDL 15.0 02/13/2024 0758   LDLCALC 67 02/13/2024 0758   LDLCALC 55 05/21/2021 0945     Risk Assessment/Calculations:                Physical Exam:    VS:  BP 134/80 (BP Location: Left Arm, Patient Position: Sitting, Cuff Size: Normal)   Pulse 75   Ht 5' 4 (1.626 m)   Wt 150 lb (68 kg)   LMP 08/08/2009   SpO2 95%   BMI 25.75 kg/m     Wt Readings from Last 3 Encounters:  11/28/24 150 lb (68 kg)  11/23/24 151 lb 6.4 oz (68.7 kg)  11/19/24 151 lb 6.4 oz (68.7 kg)     GEN:  Well nourished, well developed in no acute distress HEENT: Normal NECK: No JVD; No carotid bruits LYMPHATICS: No lymphadenopathy CARDIAC: RRR, 2/6 ejection murmur at the right upper sternal border RESPIRATORY:  Clear to auscultation without rales, wheezing or rhonchi  ABDOMEN: Soft, non-tender, non-distended MUSCULOSKELETAL:  No edema; No deformity  SKIN: Warm and dry NEUROLOGIC:  Alert and oriented x 3 PSYCHIATRIC:  Normal affect   Assessment & Plan S/P TAVR (transcatheter aortic valve replacement) NYHA functional class 1 limitation, improved from baseline.  She has started cardiac rehab.  I personally reviewed her  echo images which demonstrate normal LVEF, normal function of her TAVR prosthesis with a mean gradient measured between 14-17 mmHg, and trace paravalvular regurgitation.  The formal echo interpretation is currently pending.  I would like to see her back in 1 year for her TAVR follow-up per protocol with an echocardiogram at that time.  She follows SBE prophylaxis per guidelines.  She will continue aspirin  81 mg daily. Coronary artery disease involving native coronary artery of native heart without angina pectoris Nonobstructive CAD and patent RCA stent demonstrated on pre-TAVR cardiac cath. Continue ASA and atorvastatin .  No anginal symptoms.     Medication Adjustments/Labs  and Tests Ordered: Current medicines are reviewed at length with the patient today.  Concerns regarding medicines are outlined above.  Orders Placed This Encounter  Procedures   ECHOCARDIOGRAM COMPLETE   No orders of the defined types were placed in this encounter.   Patient Instructions  Medication Instructions:  No medication changes were made at this visit. Continue current regimen.   *If you need a refill on your cardiac medications before your next appointment, please call your pharmacy*  Lab Work: None ordered today. If you have labs (blood work) drawn today and your tests are completely normal, you will receive your results only by: MyChart Message (if you have MyChart) OR A paper copy in the mail If you have any lab test that is abnormal or we need to change your treatment, we will call you to review the results.  Testing/Procedures: Your physician has requested that you have an echocardiogram to be completed in 1 year prior to your follow-up with Dr. Wonda. Echocardiography is a painless test that uses sound waves to create images of your heart. It provides your doctor with information about the size and shape of your heart and how well your hearts chambers and valves are working. This procedure takes  approximately one hour. There are no restrictions for this procedure. Please do NOT wear cologne, perfume, aftershave, or lotions (deodorant is allowed). Please arrive 15 minutes prior to your appointment time.  Please note: We ask at that you not bring children with you during ultrasound (echo/ vascular) testing. Due to room size and safety concerns, children are not allowed in the ultrasound rooms during exams. Our front office staff cannot provide observation of children in our lobby area while testing is being conducted. An adult accompanying a patient to their appointment will only be allowed in the ultrasound room at the discretion of the ultrasound technician under special circumstances. We apologize for any inconvenience.   Follow-Up: At [facility], you and your health needs are our priority.  As part of our continuing mission to provide you with exceptional heart care, our providers are all part of one team.  This team includes your primary Cardiologist (physician) and Advanced Practice Providers or APPs (Physician Assistants and Nurse Practitioners) who all work together to provide you with the care you need, when you need it.  Your next appointment:   1 year(s)  Provider:   Ozell Wonda, MD     Signed, Ozell Wonda, MD  11/28/2024 12:12 PM     HeartCare     [1]  Current Meds  Medication Sig   ACCU-CHEK AVIVA PLUS test strip CHECK BLOOD SUGAR FOUR TIMES DAILY AS NEEDED   ACCU-CHEK FASTCLIX LANCETS MISC Use to check blood sugar 4 times daily as needed.  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   amoxicillin  (AMOXIL ) 500 MG tablet Take 4 tablets (2,000 mg total) by mouth as directed. 1 hour prior to dental work including cleanings   Ascorbic Acid (VITAMIN C) 1000 MG tablet Take 1,000 mg by mouth in the morning.   aspirin  EC 81 MG tablet Take 1 tablet (81 mg total) by mouth daily.   atorvastatin  (LIPITOR) 20 MG tablet TAKE 1 TABLET BY MOUTH ONCE DAILY    Blood Glucose Monitoring Suppl (ACCU-CHEK AVIVA PLUS) W/DEVICE KIT Use to check blood sugar 4 times daily as needed.  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  conjugated estrogens (PREMARIN) vaginal cream Place 1 applicator vaginally once a week. With weekly pessary usage   insulin  glargine (LANTUS  SOLOSTAR) 100 UNIT/ML Solostar Pen Inject 24 Units into the skin daily.   Insulin  Pen Needle (BD PEN NEEDLE SHORT ULTRAFINE) 31G X 8 MM MISC Use as directed to inject insulin  daily   lisinopril  (ZESTRIL ) 20 MG tablet TAKE ONE TABLET BY MOUTH ONCE DAILY   MAGNESIUM  CITRATE PO Take 250 mg by mouth at bedtime.   metFORMIN  (GLUCOPHAGE ) 500 MG tablet TAKE ONE TABLET BY MOUTH TWICE DAILY   nitroGLYCERIN  (NITROSTAT ) 0.4 MG SL tablet Place 1 tablet (0.4 mg total) under the tongue every 5 (five) minutes as needed.   Omega-3 Fatty Acids (FISH OIL) 1000 MG CAPS Take 1,000 mg by mouth in the morning.   polyethylene glycol powder (GLYCOLAX /MIRALAX ) 17 GM/SCOOP powder Take 8.5 g by mouth daily. (Patient taking differently: Take 8.5 g by mouth as needed.)   "

## 2024-11-28 NOTE — Assessment & Plan Note (Addendum)
 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 Continue ASA and atorvastatin .  No anginal symptoms.

## 2024-11-29 ENCOUNTER — Encounter: Admitting: Emergency Medicine

## 2024-11-29 ENCOUNTER — Encounter

## 2024-11-29 DIAGNOSIS — Z952 Presence of prosthetic heart valve: Secondary | ICD-10-CM

## 2024-11-29 LAB — GLUCOSE, CAPILLARY
Glucose-Capillary: 159 mg/dL — ABNORMAL HIGH (ref 70–99)
Glucose-Capillary: 119 mg/dL — ABNORMAL HIGH (ref 70–99)

## 2024-11-29 NOTE — Progress Notes (Signed)
 Daily Session Note  Patient Details  Name: Gloria [name] MRN: [id number] Date of Birth: 02/01/1946 Referring Provider:   Flowsheet Row Cardiac Rehab from 11/19/2024 in [facility] Cardiac and Pulmonary Rehab  Referring Provider Wonda Sharper, MD    Encounter Date: 11/29/2024  Check In:  Session Check In - 11/29/24 9077       Check-In   Supervising physician immediately available to respond to emergencies See telemetry face sheet for immediately available ER MD    Location [facility]-Cardiac & Pulmonary Rehab    Staff Present Leita Franks RN,BSN;Joseph [facility] BS, Exercise Physiologist;Noah Tickle, BS, Exercise Physiologist    Virtual Visit No    Medication changes reported     No    Fall or balance concerns reported    No    Tobacco Cessation No Change    Warm-up and Cool-down Performed on first and last piece of equipment    Resistance Training Performed Yes    VAD Patient? No    PAD/SET Patient? No      Pain Assessment   Currently in Pain? No/denies             Tobacco Use History[1]  Goals Met:  Independence with exercise equipment Exercise tolerated well No report of concerns or symptoms today Strength training completed today  Goals Unmet:  Not Applicable  Comments: Pt able to follow exercise prescription today without complaint.  Will continue to monitor for progression.    Dr. Oneil Pinal is Medical Director for [facility] Cardiac Rehabilitation.  Dr. Fuad Aleskerov is Medical Director for [facility] Pulmonary Rehabilitation.    [1]  Social History Tobacco Use  Smoking Status Never  Smokeless Tobacco Never

## 2024-11-30 ENCOUNTER — Encounter

## 2024-11-30 DIAGNOSIS — Z952 Presence of prosthetic heart valve: Secondary | ICD-10-CM

## 2024-11-30 LAB — GLUCOSE, CAPILLARY
Glucose-Capillary: 190 mg/dL — ABNORMAL HIGH (ref 70–99)
Glucose-Capillary: 132 mg/dL — ABNORMAL HIGH (ref 70–99)

## 2024-11-30 NOTE — Progress Notes (Signed)
 Daily Session Note  Patient Details  Name: Gloria [name] MRN: [id number] Date of Birth: 1946-09-01 Referring Provider:   Flowsheet Row Cardiac Rehab from 11/19/2024 in [facility] Cardiac and Pulmonary Rehab  Referring Provider Wonda Sharper, MD    Encounter Date: 11/30/2024  Check In:  Session Check In - 11/30/24 0906       Check-In   Supervising physician immediately available to respond to emergencies See telemetry face sheet for immediately available ER MD    Location [facility]-Cardiac & Pulmonary Rehab    Staff Present Burnard Davenport RN,BSN,MPA;Joseph Hood RCP,RRT,BSRT;Noah Tickle, [place], Exercise Physiologist;Maxon Conetta BS, Exercise Physiologist    Virtual Visit No    Medication changes reported     No    Fall or balance concerns reported    No    Tobacco Cessation No Change    Warm-up and Cool-down Performed on first and last piece of equipment    Resistance Training Performed Yes    VAD Patient? No    PAD/SET Patient? No      Pain Assessment   Currently in Pain? No/denies             Tobacco Use History[1]  Goals Met:  Independence with exercise equipment Exercise tolerated well No report of concerns or symptoms today Strength training completed today  Goals Unmet:  Not Applicable  Comments: Pt able to follow exercise prescription today without complaint.  Will continue to monitor for progression.    Dr. Oneil Pinal is Medical Director for [facility] Cardiac Rehabilitation.  Dr. Fuad Aleskerov is Medical Director for [facility] Pulmonary Rehabilitation.    [1]  Social History Tobacco Use  Smoking Status Never  Smokeless Tobacco Never

## 2024-12-03 ENCOUNTER — Ambulatory Visit (HOSPITAL_COMMUNITY)

## 2024-12-03 ENCOUNTER — Encounter

## 2024-12-03 ENCOUNTER — Ambulatory Visit: Admitting: Physician Assistant

## 2024-12-04 ENCOUNTER — Encounter

## 2024-12-05 ENCOUNTER — Encounter

## 2024-12-05 DIAGNOSIS — Z952 Presence of prosthetic heart valve: Secondary | ICD-10-CM

## 2024-12-05 NOTE — Progress Notes (Signed)
 Daily Session Note  Patient Details  Name: Gloria [name] MRN: [id number] Date of Birth: Jun 28, 1946 Referring Provider:   Flowsheet Row Cardiac Rehab from 11/19/2024 in [facility] Cardiac and Pulmonary Rehab  Referring Provider Wonda Sharper, MD    Encounter Date: 12/05/2024  Check In:  Session Check In - 12/05/24 9062       Check-In   Supervising physician immediately available to respond to emergencies See telemetry face sheet for immediately available ER MD    Location [facility]-Cardiac & Pulmonary Rehab    Staff Present Burnard Davenport RN,BSN,MPA;Maxon Conetta BS, Exercise Physiologist;Margaret Best, MS, Exercise Physiologist;Noah Tickle, BS, Exercise Physiologist    Virtual Visit No    Medication changes reported     No    Fall or balance concerns reported    No    Tobacco Cessation No Change    Warm-up and Cool-down Performed on first and last piece of equipment    Resistance Training Performed Yes    VAD Patient? No    PAD/SET Patient? No      Pain Assessment   Currently in Pain? No/denies             Tobacco Use History[1]  Goals Met:  Independence with exercise equipment Exercise tolerated well No report of concerns or symptoms today Strength training completed today  Goals Unmet:  Not Applicable  Comments: Pt able to follow exercise prescription today without complaint.  Will continue to monitor for progression.    Dr. Oneil Pinal is Medical Director for [facility] Cardiac Rehabilitation.  Dr. Fuad Aleskerov is Medical Director for [facility] Pulmonary Rehabilitation.    [1]  Social History Tobacco Use  Smoking Status Never  Smokeless Tobacco Never

## 2024-12-06 ENCOUNTER — Encounter: Admitting: Emergency Medicine

## 2024-12-06 ENCOUNTER — Encounter

## 2024-12-06 DIAGNOSIS — Z952 Presence of prosthetic heart valve: Secondary | ICD-10-CM

## 2024-12-06 NOTE — Progress Notes (Signed)
 Daily Session Note  Patient Details  Name: Gloria [name] MRN: [id number] Date of Birth: [birth year] Referring Provider:   Flowsheet Row Cardiac Rehab from 11/19/2024 in [facility] Cardiac and Pulmonary Rehab  Referring Provider Wonda Sharper, MD    Encounter Date: 12/06/2024  Check In:  Session Check In - 12/06/24 9061       Check-In   Supervising physician immediately available to respond to emergencies See telemetry face sheet for immediately available ER MD    Location [facility]-Cardiac & Pulmonary Rehab    Staff Present Leita Franks RN,BSN;Maxon Conetta BS, Exercise Physiologist;Jason Elnor RDN,LDN;Margaret Best, MS, Exercise Physiologist    Virtual Visit No    Medication changes reported     No    Fall or balance concerns reported    No    Tobacco Cessation No Change    Warm-up and Cool-down Performed on first and last piece of equipment    Resistance Training Performed Yes    VAD Patient? No    PAD/SET Patient? No      Pain Assessment   Currently in Pain? No/denies             Tobacco Use History[1]  Goals Met:  Independence with exercise equipment Exercise tolerated well No report of concerns or symptoms today Strength training completed today  Goals Unmet:  Not Applicable  Comments: Pt able to follow exercise prescription today without complaint.  Will continue to monitor for progression.    Dr. Oneil Pinal is Medical Director for [facility] Cardiac Rehabilitation.  Dr. Fuad Aleskerov is Medical Director for [facility] Pulmonary Rehabilitation.    [1]  Social History Tobacco Use  Smoking Status Never  Smokeless Tobacco Never

## 2024-12-07 ENCOUNTER — Encounter

## 2024-12-07 DIAGNOSIS — Z952 Presence of prosthetic heart valve: Secondary | ICD-10-CM

## 2024-12-07 NOTE — Progress Notes (Signed)
 Daily Session Note  Patient Details  Name: Gloria [name] MRN: [id number] Date of Birth: 1946/10/17 Referring Provider:   Flowsheet Row Cardiac Rehab from 11/19/2024 in [facility] Cardiac and Pulmonary Rehab  Referring Provider Wonda Sharper, MD    Encounter Date: 12/07/2024  Check In:  Session Check In - 12/07/24 0904       Check-In   Supervising physician immediately available to respond to emergencies See telemetry face sheet for immediately available ER MD    Location [facility]-Cardiac & Pulmonary Rehab    Staff Present Burnard Davenport RN,BSN,MPA;Laura Cates RN,BSN;Joseph Rolinda NORWOOD HARMAN Verlie Laird, [place], Exercise Physiologist    Virtual Visit No    Medication changes reported     No    Fall or balance concerns reported    No    Tobacco Cessation No Change    Warm-up and Cool-down Performed on first and last piece of equipment    Resistance Training Performed Yes    VAD Patient? No    PAD/SET Patient? No      Pain Assessment   Currently in Pain? No/denies             Tobacco Use History[1]  Goals Met:  Independence with exercise equipment Exercise tolerated well No report of concerns or symptoms today Strength training completed today  Goals Unmet:  Not Applicable  Comments: Pt able to follow exercise prescription today without complaint.  Will continue to monitor for progression.    Dr. Oneil Pinal is Medical Director for [facility] Cardiac Rehabilitation.  Dr. Fuad Aleskerov is Medical Director for [facility] Pulmonary Rehabilitation.    [1]  Social History Tobacco Use  Smoking Status Never  Smokeless Tobacco Never

## 2024-12-10 ENCOUNTER — Encounter

## 2024-12-11 ENCOUNTER — Encounter

## 2024-12-12 ENCOUNTER — Encounter: Admitting: Emergency Medicine

## 2024-12-12 DIAGNOSIS — Z952 Presence of prosthetic heart valve: Secondary | ICD-10-CM

## 2024-12-12 NOTE — Progress Notes (Signed)
 Daily Session Note  Patient Details  Name: Gloria [name] MRN: [id number] Date of Birth: 10/22/1946 Referring Provider:   Flowsheet Row Cardiac Rehab from 11/19/2024 in [facility] Cardiac and Pulmonary Rehab  Referring Provider Wonda Sharper, MD    Encounter Date: 12/12/2024  Check In:  Session Check In - 12/12/24 9061       Check-In   Supervising physician immediately available to respond to emergencies See telemetry face sheet for immediately available ER MD    Location [facility]-Cardiac & Pulmonary Rehab    Staff Present Leita Franks RN,BSN;Joseph [facility] BS, Exercise Physiologist;Margaret Best, MS, Exercise Physiologist    Virtual Visit No    Medication changes reported     No    Fall or balance concerns reported    No    Tobacco Cessation No Change    Warm-up and Cool-down Performed on first and last piece of equipment    Resistance Training Performed Yes    VAD Patient? No    PAD/SET Patient? No      Pain Assessment   Currently in Pain? No/denies             Tobacco Use History[1]  Goals Met:  Independence with exercise equipment Exercise tolerated well No report of concerns or symptoms today Strength training completed today  Goals Unmet:  Not Applicable  Comments: Pt able to follow exercise prescription today without complaint.  Will continue to monitor for progression.    Dr. Oneil Pinal is Medical Director for [facility] Cardiac Rehabilitation.  Dr. Fuad Aleskerov is Medical Director for [facility] Pulmonary Rehabilitation.    [1]  Social History Tobacco Use  Smoking Status Never  Smokeless Tobacco Never

## 2024-12-13 ENCOUNTER — Encounter

## 2024-12-13 DIAGNOSIS — Z952 Presence of prosthetic heart valve: Secondary | ICD-10-CM

## 2024-12-13 NOTE — Progress Notes (Signed)
 Daily Session Note  Patient Details  Name: Gloria [name] MRN: [id number] Date of Birth: 02-Feb-1946 Referring Provider:   Flowsheet Row Cardiac Rehab from 11/19/2024 in [facility] Cardiac and Pulmonary Rehab  Referring Provider Wonda Sharper, MD    Encounter Date: 12/13/2024  Check In:  Session Check In - 12/13/24 1342       Check-In   Supervising physician immediately available to respond to emergencies See telemetry face sheet for immediately available ER MD    Location [facility]-Cardiac & Pulmonary Rehab    Staff Present Hoy Rodney RN,BSN;Joseph [facility] RCP,RRT,BSRT;Margaret Best, MS, Exercise Physiologist;Noah Tickle, BS, Exercise Physiologist    Virtual Visit No    Medication changes reported     No    Fall or balance concerns reported    No    Warm-up and Cool-down Performed on first and last piece of equipment    Resistance Training Performed Yes    VAD Patient? No    PAD/SET Patient? No      Pain Assessment   Currently in Pain? No/denies             Tobacco Use History[1]  Goals Met:  Independence with exercise equipment Exercise tolerated well No report of concerns or symptoms today Strength training completed today  Goals Unmet:  Not Applicable  Comments: Pt able to follow exercise prescription today without complaint.  Will continue to monitor for progression.    Dr. Oneil Pinal is Medical Director for [facility] Cardiac Rehabilitation.  Dr. Fuad Aleskerov is Medical Director for [facility] Pulmonary Rehabilitation.    [1]  Social History Tobacco Use  Smoking Status Never  Smokeless Tobacco Never

## 2024-12-14 ENCOUNTER — Encounter

## 2024-12-14 DIAGNOSIS — Z952 Presence of prosthetic heart valve: Secondary | ICD-10-CM

## 2024-12-14 NOTE — Progress Notes (Signed)
 Daily Session Note  Patient Details  Name: Gloria [name] MRN: [id number] Date of Birth: 13-Aug-1946 Referring Provider:   Flowsheet Row Cardiac Rehab from 11/19/2024 in [facility] Cardiac and Pulmonary Rehab  Referring Provider Wonda Sharper, MD    Encounter Date: 12/14/2024  Check In:  Session Check In - 12/14/24 0907       Check-In   Supervising physician immediately available to respond to emergencies See telemetry face sheet for immediately available ER MD    Location [facility]-Cardiac & Pulmonary Rehab    Staff Present Burnard Davenport RN,BSN,MPA;Maxon Conetta BS, Exercise Physiologist;Joseph Hood RCP,RRT,BSRT;Noah Tickle, [place], Exercise Physiologist    Virtual Visit No    Medication changes reported     No    Fall or balance concerns reported    No    Tobacco Cessation No Change    Warm-up and Cool-down Performed on first and last piece of equipment    Resistance Training Performed Yes    VAD Patient? No    PAD/SET Patient? No      Pain Assessment   Currently in Pain? No/denies             Tobacco Use History[1]  Goals Met:  Independence with exercise equipment Exercise tolerated well No report of concerns or symptoms today Strength training completed today  Goals Unmet:  Not Applicable  Comments: Pt able to follow exercise prescription today without complaint.  Will continue to monitor for progression.    Dr. Oneil Pinal is Medical Director for [facility] Cardiac Rehabilitation.  Dr. Fuad Aleskerov is Medical Director for [facility] Pulmonary Rehabilitation.    [1]  Social History Tobacco Use  Smoking Status Never  Smokeless Tobacco Never

## 2024-12-17 ENCOUNTER — Encounter

## 2024-12-18 ENCOUNTER — Encounter

## 2024-12-19 ENCOUNTER — Encounter

## 2024-12-20 ENCOUNTER — Encounter

## 2024-12-21 ENCOUNTER — Encounter

## 2024-12-24 ENCOUNTER — Encounter

## 2024-12-25 ENCOUNTER — Encounter

## 2024-12-26 ENCOUNTER — Encounter

## 2024-12-27 ENCOUNTER — Encounter

## 2024-12-28 ENCOUNTER — Encounter

## 2024-12-31 ENCOUNTER — Encounter

## 2025-01-01 ENCOUNTER — Encounter

## 2025-01-02 ENCOUNTER — Encounter

## 2025-01-03 ENCOUNTER — Encounter

## 2025-01-04 ENCOUNTER — Encounter

## 2025-01-07 ENCOUNTER — Encounter

## 2025-01-08 ENCOUNTER — Encounter

## 2025-01-09 ENCOUNTER — Encounter

## 2025-01-10 ENCOUNTER — Encounter

## 2025-01-11 ENCOUNTER — Encounter

## 2025-01-14 ENCOUNTER — Encounter

## 2025-01-15 ENCOUNTER — Encounter

## 2025-01-16 ENCOUNTER — Encounter: Admitting: Gastroenterology

## 2025-01-16 ENCOUNTER — Encounter

## 2025-01-17 ENCOUNTER — Encounter

## 2025-01-18 ENCOUNTER — Encounter

## 2025-01-21 ENCOUNTER — Encounter

## 2025-01-22 ENCOUNTER — Encounter

## 2025-01-23 ENCOUNTER — Encounter

## 2025-01-24 ENCOUNTER — Encounter

## 2025-01-25 ENCOUNTER — Encounter

## 2025-01-28 ENCOUNTER — Encounter

## 2025-01-29 ENCOUNTER — Encounter

## 2025-01-30 ENCOUNTER — Encounter

## 2025-01-31 ENCOUNTER — Encounter

## 2025-02-01 ENCOUNTER — Encounter

## 2025-02-04 ENCOUNTER — Encounter

## 2025-02-05 ENCOUNTER — Encounter

## 2025-02-06 ENCOUNTER — Encounter

## 2025-02-07 ENCOUNTER — Encounter

## 2025-02-08 ENCOUNTER — Encounter

## 2025-02-11 ENCOUNTER — Encounter

## 2025-02-12 ENCOUNTER — Encounter

## 2025-02-13 ENCOUNTER — Encounter

## 2025-02-14 ENCOUNTER — Encounter

## 2025-02-15 ENCOUNTER — Encounter

## 2025-02-18 ENCOUNTER — Encounter

## 2025-02-20 ENCOUNTER — Encounter

## 2025-02-25 ENCOUNTER — Encounter

## 2025-03-18 ENCOUNTER — Other Ambulatory Visit

## 2025-03-25 ENCOUNTER — Encounter: Admitting: Family Medicine

## 2025-11-18 ENCOUNTER — Ambulatory Visit (HOSPITAL_COMMUNITY)

## 2025-11-18 ENCOUNTER — Ambulatory Visit: Admitting: Physician Assistant

## 2025-11-28 ENCOUNTER — Ambulatory Visit (HOSPITAL_COMMUNITY)
